# Patient Record
Sex: Female | Born: 1945 | Race: Black or African American | Hispanic: No | Smoking: Current every day smoker
Health system: Southern US, Community
[De-identification: ages and names within clinical notes are randomized; demographics above are authoritative.]

## PROBLEM LIST (undated history)

## (undated) DIAGNOSIS — I2510 Atherosclerotic heart disease of native coronary artery without angina pectoris: Secondary | ICD-10-CM

## (undated) DIAGNOSIS — E785 Hyperlipidemia, unspecified: Secondary | ICD-10-CM

## (undated) DIAGNOSIS — R05 Cough: Secondary | ICD-10-CM

## (undated) DIAGNOSIS — I313 Pericardial effusion (noninflammatory): Secondary | ICD-10-CM

## (undated) DIAGNOSIS — I9789 Other postprocedural complications and disorders of the circulatory system, not elsewhere classified: Principal | ICD-10-CM

## (undated) DIAGNOSIS — I4891 Unspecified atrial fibrillation: Secondary | ICD-10-CM

## (undated) DIAGNOSIS — F17200 Nicotine dependence, unspecified, uncomplicated: Secondary | ICD-10-CM

## (undated) DIAGNOSIS — F319 Bipolar disorder, unspecified: Secondary | ICD-10-CM

## (undated) DIAGNOSIS — R590 Localized enlarged lymph nodes: Secondary | ICD-10-CM

## (undated) DIAGNOSIS — I639 Cerebral infarction, unspecified: Secondary | ICD-10-CM

## (undated) DIAGNOSIS — D649 Anemia, unspecified: Secondary | ICD-10-CM

## (undated) DIAGNOSIS — D72829 Elevated white blood cell count, unspecified: Secondary | ICD-10-CM

## (undated) DIAGNOSIS — J449 Chronic obstructive pulmonary disease, unspecified: Secondary | ICD-10-CM

## (undated) DIAGNOSIS — I3139 Other pericardial effusion (noninflammatory): Secondary | ICD-10-CM

## (undated) DIAGNOSIS — F32A Depression, unspecified: Secondary | ICD-10-CM

## (undated) DIAGNOSIS — F419 Anxiety disorder, unspecified: Secondary | ICD-10-CM

## (undated) DIAGNOSIS — J309 Allergic rhinitis, unspecified: Secondary | ICD-10-CM

## (undated) DIAGNOSIS — IMO0001 Reserved for inherently not codable concepts without codable children: Secondary | ICD-10-CM

## (undated) DIAGNOSIS — R0602 Shortness of breath: Secondary | ICD-10-CM

## (undated) DIAGNOSIS — I671 Cerebral aneurysm, nonruptured: Secondary | ICD-10-CM

## (undated) DIAGNOSIS — E119 Type 2 diabetes mellitus without complications: Secondary | ICD-10-CM

## (undated) HISTORY — DX: Reserved for inherently not codable concepts without codable children: IMO0001

## (undated) HISTORY — DX: Hyperlipidemia, unspecified: E78.5

## (undated) HISTORY — DX: Cerebral infarction, unspecified: I63.9

## (undated) HISTORY — DX: Elevated white blood cell count, unspecified: D72.829

## (undated) HISTORY — DX: Localized enlarged lymph nodes: R59.0

## (undated) HISTORY — DX: Anemia, unspecified: D64.9

## (undated) HISTORY — DX: Pericardial effusion (noninflammatory): I31.3

## (undated) HISTORY — DX: Nicotine dependence, unspecified, uncomplicated: F17.200

## (undated) HISTORY — DX: Other postprocedural complications and disorders of the circulatory system, not elsewhere classified: I48.91

## (undated) HISTORY — DX: Other postprocedural complications and disorders of the circulatory system, not elsewhere classified: I97.89

## (undated) HISTORY — PX: CATARACT EXTRACTION: SUR2

## (undated) HISTORY — DX: Bipolar disorder, unspecified: F31.9

## (undated) HISTORY — DX: Cerebral aneurysm, nonruptured: I67.1

## (undated) HISTORY — DX: Allergic rhinitis, unspecified: J30.9

## (undated) HISTORY — DX: Cough: R05

## (undated) HISTORY — DX: Other pericardial effusion (noninflammatory): I31.39

## (undated) HISTORY — DX: Chronic obstructive pulmonary disease, unspecified: J44.9

## (undated) HISTORY — DX: Shortness of breath: R06.02

## (undated) HISTORY — DX: Atherosclerotic heart disease of native coronary artery without angina pectoris: I25.10

## (undated) HISTORY — DX: Type 2 diabetes mellitus without complications: E11.9

---

## 2000-09-25 ENCOUNTER — Other Ambulatory Visit: Admission: RE | Admit: 2000-09-25 | Discharge: 2000-09-25 | Payer: Self-pay | Admitting: Family Medicine

## 2002-07-19 ENCOUNTER — Encounter: Payer: Self-pay | Admitting: Obstetrics & Gynecology

## 2002-07-19 ENCOUNTER — Ambulatory Visit (HOSPITAL_COMMUNITY): Admission: RE | Admit: 2002-07-19 | Discharge: 2002-07-19 | Payer: Self-pay | Admitting: Obstetrics & Gynecology

## 2003-02-28 ENCOUNTER — Emergency Department (HOSPITAL_COMMUNITY): Admission: AD | Admit: 2003-02-28 | Discharge: 2003-02-28 | Payer: Self-pay | Admitting: Family Medicine

## 2003-03-01 ENCOUNTER — Emergency Department (HOSPITAL_COMMUNITY): Admission: AD | Admit: 2003-03-01 | Discharge: 2003-03-01 | Payer: Self-pay | Admitting: Family Medicine

## 2003-05-21 ENCOUNTER — Emergency Department (HOSPITAL_COMMUNITY): Admission: AD | Admit: 2003-05-21 | Discharge: 2003-05-21 | Payer: Self-pay | Admitting: Family Medicine

## 2003-05-24 ENCOUNTER — Emergency Department (HOSPITAL_COMMUNITY): Admission: EM | Admit: 2003-05-24 | Discharge: 2003-05-24 | Payer: Self-pay | Admitting: Family Medicine

## 2003-07-10 ENCOUNTER — Encounter: Admission: RE | Admit: 2003-07-10 | Discharge: 2003-10-08 | Payer: Self-pay | Admitting: Internal Medicine

## 2003-07-21 ENCOUNTER — Ambulatory Visit (HOSPITAL_COMMUNITY): Admission: RE | Admit: 2003-07-21 | Discharge: 2003-07-21 | Payer: Self-pay | Admitting: Obstetrics & Gynecology

## 2006-04-14 HISTORY — PX: COLONOSCOPY: SHX174

## 2009-06-05 ENCOUNTER — Encounter: Admission: RE | Admit: 2009-06-05 | Discharge: 2009-06-05 | Payer: Self-pay | Admitting: Family Medicine

## 2011-05-20 ENCOUNTER — Ambulatory Visit (HOSPITAL_COMMUNITY)
Admission: RE | Admit: 2011-05-20 | Discharge: 2011-05-20 | Disposition: A | Discharge status: Home / Self Care | Payer: Medicare Other | Source: Ambulatory Visit | Attending: Internal Medicine | Admitting: Internal Medicine

## 2011-05-20 ENCOUNTER — Other Ambulatory Visit: Payer: Self-pay

## 2011-05-20 ENCOUNTER — Other Ambulatory Visit (HOSPITAL_COMMUNITY): Payer: Self-pay | Admitting: Internal Medicine

## 2011-05-20 DIAGNOSIS — R5381 Other malaise: Secondary | ICD-10-CM | POA: Insufficient documentation

## 2011-05-20 DIAGNOSIS — E119 Type 2 diabetes mellitus without complications: Secondary | ICD-10-CM | POA: Insufficient documentation

## 2011-05-20 DIAGNOSIS — R5383 Other fatigue: Secondary | ICD-10-CM

## 2011-05-20 DIAGNOSIS — R0602 Shortness of breath: Secondary | ICD-10-CM | POA: Insufficient documentation

## 2011-05-20 DIAGNOSIS — R609 Edema, unspecified: Secondary | ICD-10-CM

## 2011-05-20 DIAGNOSIS — F17200 Nicotine dependence, unspecified, uncomplicated: Secondary | ICD-10-CM | POA: Insufficient documentation

## 2011-10-24 ENCOUNTER — Telehealth: Payer: Self-pay | Admitting: Oncology

## 2011-10-24 NOTE — Telephone Encounter (Signed)
lmonvm adviisng the pt of her new pt appt with dr Gaylyn Rong

## 2011-10-24 NOTE — Telephone Encounter (Signed)
Pt called to confirm her new pt appt

## 2011-10-27 ENCOUNTER — Telehealth: Payer: Self-pay | Admitting: Oncology

## 2011-10-27 NOTE — Telephone Encounter (Signed)
Referred by Dr. Leticia Clas Dx- Leukocytosis

## 2011-10-29 ENCOUNTER — Encounter: Payer: Self-pay | Admitting: Oncology

## 2011-10-29 DIAGNOSIS — D72829 Elevated white blood cell count, unspecified: Secondary | ICD-10-CM | POA: Insufficient documentation

## 2011-10-30 ENCOUNTER — Other Ambulatory Visit: Payer: BC Managed Care – PPO | Admitting: Lab

## 2011-10-30 ENCOUNTER — Encounter: Payer: Self-pay | Admitting: Oncology

## 2011-10-30 ENCOUNTER — Ambulatory Visit: Payer: BC Managed Care – PPO | Admitting: Oncology

## 2011-10-30 ENCOUNTER — Telehealth: Payer: Self-pay | Admitting: Oncology

## 2011-10-30 ENCOUNTER — Ambulatory Visit: Payer: Medicare Other

## 2011-10-30 ENCOUNTER — Other Ambulatory Visit (HOSPITAL_BASED_OUTPATIENT_CLINIC_OR_DEPARTMENT_OTHER): Payer: Medicare Other | Admitting: Lab

## 2011-10-30 ENCOUNTER — Ambulatory Visit (HOSPITAL_BASED_OUTPATIENT_CLINIC_OR_DEPARTMENT_OTHER): Payer: Medicare Other | Admitting: Oncology

## 2011-10-30 VITALS — BP 125/81 | HR 99 | Temp 97.9°F | Ht 63.5 in | Wt 181.8 lb

## 2011-10-30 DIAGNOSIS — F319 Bipolar disorder, unspecified: Secondary | ICD-10-CM

## 2011-10-30 DIAGNOSIS — D72829 Elevated white blood cell count, unspecified: Secondary | ICD-10-CM

## 2011-10-30 DIAGNOSIS — R059 Cough, unspecified: Secondary | ICD-10-CM

## 2011-10-30 DIAGNOSIS — IMO0001 Reserved for inherently not codable concepts without codable children: Secondary | ICD-10-CM | POA: Insufficient documentation

## 2011-10-30 DIAGNOSIS — R0602 Shortness of breath: Secondary | ICD-10-CM

## 2011-10-30 DIAGNOSIS — F17200 Nicotine dependence, unspecified, uncomplicated: Secondary | ICD-10-CM

## 2011-10-30 DIAGNOSIS — E119 Type 2 diabetes mellitus without complications: Secondary | ICD-10-CM

## 2011-10-30 DIAGNOSIS — E785 Hyperlipidemia, unspecified: Secondary | ICD-10-CM | POA: Insufficient documentation

## 2011-10-30 DIAGNOSIS — R05 Cough: Secondary | ICD-10-CM

## 2011-10-30 HISTORY — DX: Cough, unspecified: R05.9

## 2011-10-30 HISTORY — DX: Shortness of breath: R06.02

## 2011-10-30 LAB — CBC WITH DIFFERENTIAL/PLATELET
BASO%: 0.6 % (ref 0.0–2.0)
Eosinophils Absolute: 0.2 10*3/uL (ref 0.0–0.5)
MCHC: 33 g/dL (ref 31.5–36.0)
MONO#: 1.3 10*3/uL — ABNORMAL HIGH (ref 0.1–0.9)
MONO%: 7.7 % (ref 0.0–14.0)
NEUT#: 12.2 10*3/uL — ABNORMAL HIGH (ref 1.5–6.5)
RBC: 4.88 10*6/uL (ref 3.70–5.45)
RDW: 14.9 % — ABNORMAL HIGH (ref 11.2–14.5)
WBC: 16.5 10*3/uL — ABNORMAL HIGH (ref 3.9–10.3)

## 2011-10-30 LAB — MORPHOLOGY: RBC Comments: NORMAL

## 2011-10-30 LAB — CHCC SMEAR

## 2011-10-30 NOTE — Telephone Encounter (Signed)
gv pt appt schedule for AUG. S/w [facility] scheduling re bx order. [facility] will call pt - pt aware.

## 2011-10-30 NOTE — Progress Notes (Signed)
[facility]  Telephone:(336) [phone] Fax:(336) [phone]     INITIAL HEMATOLOGY CONSULTATION    Referral MD:  Dr. Quitman Livings, M.D.  Reason for Referral:  Leukocytosis, neutrophil predominant    HPI:  [name] is a 66 year-old [place] American woman who is a current smoker, history of COPD, bipolar.  She said that she did not have leukocytosis until 05/2011.  Her previous CBC were reportedly normal; I am awaiting result for confirmation.  On 05/23/2011, she had a routine CBC which showed WBC 16.47; Hgb 14.8; plt 389. Her CBC was repeated again on 07/28/2011 which showed WBC 16.6; Hgb 15; Plt 380.  Given persistent leukocytosis, she was kindly referred to the [facility] for evaluation.    Mercedes Thornton presented to the clinic by herself today.  She reported feeling relatively well.  She has chronic cough especially in the morning with clear sputum.  She attributed this to smoking.  She also has mild SOB, and DOE which she again attributes to COPD.  She still smokes cigarettes about 1/2 pack per day; however, she is not ready to quit smoking.  She has poor dental condition in her bilateral lower molars which will have dental treatment soon.  She denied severe pain, or purulent discharge from these teeth.  She has intermittent left ankle swelling with mild erythema but without pain.   Patient denies fever, anorexia, weight loss, fatigue, headache, visual changes, confusion, drenching night sweats, palpable lymph node swelling, mucositis, odynophagia, dysphagia, nausea vomiting, jaundice, chest pain, palpitation, gum bleeding, epistaxis, hematemesis, hemoptysis, abdominal pain, abdominal swelling, early satiety, melena, hematochezia, hematuria, skin rash, spontaneous bleeding, joint swelling, joint pain, heat or cold intolerance, bowel bladder incontinence, back pain, focal motor weakness, paresthesia, depression, suicidal or homocidal ideation, feeling hopelessness.   Past  Medical History  Diagnosis Date  . Leukocytosis   . COPD (chronic obstructive pulmonary disease)   . Allergic rhinitis   . DM (diabetes mellitus)   . Bipolar affective disorder   . Hyperlipidemia   . Smoking   :    Past Surgical History  Procedure Date  . Cataract extraction   . Cesarean section   . Colonoscopy 2008    negative per pt's report, with [facility]   :   CURRENT MEDS: Current Outpatient Prescriptions  Medication Sig Dispense Refill  . Cinnamon 500 MG TABS Take by mouth daily.      . clonazePAM (KLONOPIN) 0.5 MG tablet Take 0.5 mg by mouth daily as needed. One per day or as needed.      Marland Kitchen Cod Liver Oil CAPS Take by mouth daily.      Marland Kitchen ezetimibe (ZETIA) 10 MG tablet Take 10 mg by mouth daily.      . Fenofibrate (TRICOR PO) Take by mouth daily.      . fish oil-omega-3 fatty acids 1000 MG capsule Take 1 g by mouth daily.      Marland Kitchen GLIMEPIRIDE PO Take by mouth daily.      . metFORMIN (GLUCOPHAGE) 500 MG tablet Take 1,000 mg by mouth 2 (two) times daily with a meal.      . Multiple Vitamins-Minerals (WOMENS MULTIVITAMIN PLUS PO) Take by mouth daily.      . vitamin C (ASCORBIC ACID) 500 MG tablet Take 500 mg by mouth daily.          Allergies  Allergen Reactions  . Penicillins Swelling  :  Family History  Problem Relation Age of Onset  .  Stroke Mother   . Cancer Father     prostate  :  History   Social History  . Marital Status: Divorced    Spouse Name: N/A    Number of Children: 2  . Years of Education: N/A   Occupational History  .      retired grade school/middle school teacher   Social History Main Topics  . Smoking status: Current Everyday Smoker -- 1.0 packs/day for 40 years  . Smokeless tobacco: Not on file  . Alcohol Use: No  . Drug Use: No  . Sexually Active:    Other Topics Concern  . Not on file   Social History Narrative  . No narrative on file  :  REVIEW OF SYSTEM:  The rest of the 14-point review of sytem was negative.    Exam: ECOG 0-1  General:  well-nourished woman, in no acute distress.  Eyes:  no scleral icterus.  ENT:  There were no oropharyngeal lesions.  Neck was without thyromegaly.  Lymphatics:  Negative cervical, supraclavicular or axillary adenopathy.  Respiratory: lungs were clear bilaterally without wheezing or crackles.  Cardiovascular:  Regular rate and rhythm, S1/S2, without murmur, rub or gallop.  There was no pedal edema.  GI:  abdomen was soft, flat, nontender, nondistended, without organomegaly.  Muscoloskeletal:  no spinal tenderness of palpation of vertebral spine.  Skin exam was without echymosis, petichae.  Neuro exam was nonfocal.  Patient was able to get on and off exam table without assistance.  Gait was normal.  Patient was alerted and oriented.  Attention was good.   Language was appropriate.  Mood was normal without depression.  Speech was not pressured.  Thought content was not tangential.    LABS:  Lab Results  Component Value Date   WBC 16.5* 10/30/2011   HGB 14.3 10/30/2011   HCT 43.4 10/30/2011   PLT 370 10/30/2011    Blood smear review:   I personally reviewed the patient's peripheral blood smear today.  There was isocytosis.  There was no peripheral blast.  However, there were occasional atypical lymphocytes.   There was no schistocytosis, spherocytosis, target cell, rouleaux formation, tear drop cell.  There was no giant platelets or platelet clumps.      ASSESSMENT AND PLAN:   1.  Smoking:  I strongly urged her to stop smoking as she is at risk of cancer and vascular disease.  Again, she is not emotionally ready to quit.  I advised her to contact us for help when she is ready.  Given her extensive history of smoking, she is at high risk of bronchogenic carcinoma.  She also has chronic cough and mild SOB which could be due to COPD but I need to rule out lung cancer. I recommended screening lung CT to rule out bronchogenic carcinoma. She fits the profile for high risk per  NCCN guideline (age >62; smoker >30 pack years, COPD) qualifying her for a screening chest CT.    2.  Cough:  Most likely smoker cough/bronchtis, need to rule out lung cancer.   3.  Dyspnea on exertion, SOB:   Most likely COPD; but need to rule out lung cancer.   4.  Bipolar:  On Klonopin per PCP.  She has not been on lithium for more than 20 years.   5.  Diabetes mellitus, type II:  Poor controlled (last Hgb A1c was 8); no apparent complication.  She is on metformin and Glimepiride.   6.  Hyperlipidemia:  She  is on Zetia, Tricor, fish oil per PCP.   7.  Leukocytosis:  Reportedly acute for the past 3 months - Differential diagnosis:  Most likely reactive.  However, given her persistent leukocytosis without obvious source of infection or primary reactive source, I need to rule out lymphoproliferative disease.   - Work up:   I strongly recommended diagnostic bone marrow biopsy.  She is relatively up to date with age-appropriate cancer screening.  She claimed to be up to date with colon cancer screening with both colonoscopy and last one was sigmoidoscopy with [facility] about 5 years ago.  Her last Pap smear was about 3 years ago, and they have always been negative.  Her last mammogram was about 3 years ago and was reportedly negative.  She examines her own breasts on a routine basis and has not found any abnormality.  However, I recommended CT chest for lung cancer screening given her extensive history of smoking with cough, DOE.   - Treatment:  Pending work up.   Thank you for this referral.     The length of time of the face-to-face encounter was 45 minutes. More than 50% of time was spent counseling and coordination of care.

## 2011-10-30 NOTE — Patient Instructions (Signed)
1.  Issue:  Leukocytosis (elevated white blood cell).  Supposedly an acute issue.  I will need to contact Dr. Roseanne Reno for past CBC to confirm this since an acute rise in WBC is more concerning than if it has been chronic. 2.  Work up:  Bone marrow biopsy by Interventional Radiologist to rule out leukemia, lymphoma.   If bone marrow biopsy is negative.  We can attribute your leukocytosis to non hematology issue, and your primary doctor may need to help [place] figure out what these are.   3.  Follow up:  Radiology will call you with appointment for bone marrow biopsy.  I will see you about 3 weeks after bone marrow biopsy to discuss the result.

## 2011-10-30 NOTE — Progress Notes (Signed)
Patient came in today as a new patient and she has two insurance,she said she should be [place] as far as financial assistance.

## 2011-10-31 LAB — FLOW CYTOMETRY

## 2011-11-05 ENCOUNTER — Telehealth: Payer: Self-pay | Admitting: *Deleted

## 2011-11-05 ENCOUNTER — Telehealth: Payer: Self-pay | Admitting: Oncology

## 2011-11-05 NOTE — Telephone Encounter (Signed)
Pt left VM earlier today stated had questions about some tests.  I called pt back and she said her questions have been answered by Radiology and everything is "straightened out."   Instructed pt to call back if any new questions/concerns.

## 2011-11-05 NOTE — Telephone Encounter (Signed)
Received call from [facility] @ [facility] today re outstanding order for ct-chest. Also when Sheralyn Boatman called pt w/bx appt pt asked her about ct appt. Order for ct was entered 7/18 @ 5:58 pm after pt had already been checked out. Per Sheralyn Boatman she will add ct for 7/30 w/bx. Per Sheralyn Boatman pt requested that ct be same day as bx. Per Sheralyn Boatman she will contact pt re ct appt.

## 2011-11-06 ENCOUNTER — Other Ambulatory Visit: Payer: Self-pay | Admitting: Radiology

## 2011-11-10 ENCOUNTER — Telehealth: Payer: Self-pay | Admitting: *Deleted

## 2011-11-10 DIAGNOSIS — D72829 Elevated white blood cell count, unspecified: Secondary | ICD-10-CM

## 2011-11-10 NOTE — Telephone Encounter (Signed)
Attempted to reach pt again w/o any answer.  Spoke w/ Radiology and informed order was placed in computer for BMET for them to do at [facility] in the morning before her CT.

## 2011-11-10 NOTE — Telephone Encounter (Signed)
Rec note from Radiology states pt needs to have BMET done prior to CT scheduled for tomorrow.  Left pt VM at home to return call so I can schedule a lab appt prior to CT scan.

## 2011-11-11 ENCOUNTER — Encounter (HOSPITAL_COMMUNITY): Payer: Self-pay

## 2011-11-11 ENCOUNTER — Ambulatory Visit (HOSPITAL_COMMUNITY)
Admission: RE | Admit: 2011-11-11 | Discharge: 2011-11-11 | Disposition: A | Discharge status: Home / Self Care | Payer: Medicare Other | Source: Ambulatory Visit | Attending: Oncology | Admitting: Oncology

## 2011-11-11 DIAGNOSIS — R05 Cough: Secondary | ICD-10-CM

## 2011-11-11 DIAGNOSIS — D72829 Elevated white blood cell count, unspecified: Secondary | ICD-10-CM

## 2011-11-11 DIAGNOSIS — R059 Cough, unspecified: Secondary | ICD-10-CM

## 2011-11-11 DIAGNOSIS — Z79899 Other long term (current) drug therapy: Secondary | ICD-10-CM | POA: Insufficient documentation

## 2011-11-11 DIAGNOSIS — J4489 Other specified chronic obstructive pulmonary disease: Secondary | ICD-10-CM | POA: Insufficient documentation

## 2011-11-11 DIAGNOSIS — F17200 Nicotine dependence, unspecified, uncomplicated: Secondary | ICD-10-CM

## 2011-11-11 DIAGNOSIS — R0602 Shortness of breath: Secondary | ICD-10-CM

## 2011-11-11 DIAGNOSIS — F319 Bipolar disorder, unspecified: Secondary | ICD-10-CM | POA: Insufficient documentation

## 2011-11-11 DIAGNOSIS — D473 Essential (hemorrhagic) thrombocythemia: Secondary | ICD-10-CM | POA: Insufficient documentation

## 2011-11-11 DIAGNOSIS — E785 Hyperlipidemia, unspecified: Secondary | ICD-10-CM

## 2011-11-11 DIAGNOSIS — E119 Type 2 diabetes mellitus without complications: Secondary | ICD-10-CM | POA: Insufficient documentation

## 2011-11-11 DIAGNOSIS — J449 Chronic obstructive pulmonary disease, unspecified: Secondary | ICD-10-CM | POA: Insufficient documentation

## 2011-11-11 LAB — CREATININE, SERUM
Creatinine, Ser: 0.62 mg/dL (ref 0.50–1.10)
GFR calc Af Amer: >90 mL/min (ref >90)
GFR calc non Af Amer: >90 mL/min (ref >90)

## 2011-11-11 LAB — CBC
Hemoglobin: 14.1 g/dL (ref 12.0–15.0)
MCH: 29.3 pg (ref 26.0–34.0)
MCHC: 33.8 g/dL (ref 30.0–36.0)
MCV: 86.7 fL (ref 78.0–100.0)
RBC: 4.81 MIL/uL (ref 3.87–5.11)

## 2011-11-11 LAB — GLUCOSE, CAPILLARY: Glucose-Capillary: 121 mg/dL — ABNORMAL HIGH (ref 70–99)

## 2011-11-11 LAB — APTT: aPTT: 34 s (ref 24–37)

## 2011-11-11 LAB — PROTIME-INR: Prothrombin Time: 12.6 seconds (ref 11.6–15.2)

## 2011-11-11 MED ORDER — HYDROCODONE-ACETAMINOPHEN 5-325 MG PO TABS
1.0000 | ORAL_TABLET | ORAL | Status: DC | PRN
  Filled 2011-11-11: qty 2

## 2011-11-11 MED ORDER — IOHEXOL 300 MG/ML  SOLN
80.0000 mL | Freq: Once | INTRAMUSCULAR | Status: AC | PRN
  Administered 2011-11-11: 80 mL via INTRAVENOUS

## 2011-11-11 MED ORDER — FENTANYL CITRATE 0.05 MG/ML IJ SOLN
INTRAMUSCULAR | Status: AC | PRN
  Administered 2011-11-11 (×2): 100 ug via INTRAVENOUS

## 2011-11-11 MED ORDER — SODIUM CHLORIDE 0.9 % IV SOLN: Freq: Once | INTRAVENOUS | Status: DC

## 2011-11-11 MED ORDER — MIDAZOLAM HCL 5 MG/5ML IJ SOLN
INTRAMUSCULAR | Status: AC | PRN
  Administered 2011-11-11 (×2): 2 mg via INTRAVENOUS

## 2011-11-11 MED ORDER — FENTANYL CITRATE 0.05 MG/ML IJ SOLN
INTRAMUSCULAR | Status: AC
  Filled 2011-11-11: qty 4

## 2011-11-11 MED ORDER — MIDAZOLAM HCL 2 MG/2ML IJ SOLN
INTRAMUSCULAR | Status: AC
  Filled 2011-11-11: qty 4

## 2011-11-11 NOTE — Procedures (Signed)
Procedure:  CT guided bone marrow biopsy Aspirate and core bx of right iliac bone marrow.  No complications

## 2011-11-11 NOTE — Progress Notes (Signed)
IV D/C from right hand site WNL cath tip intact .

## 2011-11-11 NOTE — H&P (Signed)
Mercedes Thornton is an 66 y.o. female.   Chief Complaint: "I'm here for a bone marrow biopsy" HPI: Patient with history of persistent leukocytosis presents today for CT guided bone marrow biopsy.  Past Medical History  Diagnosis Date  . Leukocytosis   . COPD (chronic obstructive pulmonary disease)   . Allergic rhinitis   . DM (diabetes mellitus)   . Bipolar affective disorder   . Hyperlipidemia   . Smoking   . Cough 10/30/2011  . SOB (shortness of breath) 10/30/2011    Past Surgical History  Procedure Date  . Cataract extraction   . Cesarean section   . Colonoscopy 2008    negative per pt's report, with [facility]     Family History  Problem Relation Age of Onset  . Stroke Mother   . Cancer Father     prostate   Social History:  reports that she has been smoking.  She does not have any smokeless tobacco history on file. She reports that she does not drink alcohol or use illicit drugs.  Allergies:  Allergies  Allergen Reactions  . Penicillins Swelling    Current outpatient prescriptions:Cinnamon 500 MG TABS, Take by mouth daily., Disp: , Rfl: ;  clonazePAM (KLONOPIN) 0.5 MG tablet, Take 0.5 mg by mouth daily as needed. One per day or as needed., Disp: , Rfl: ;  Cod Liver Oil CAPS, Take by mouth daily., Disp: , Rfl: ;  ezetimibe (ZETIA) 10 MG tablet, Take 10 mg by mouth daily., Disp: , Rfl: ;  Fenofibrate (TRICOR PO), Take by mouth daily., Disp: , Rfl:  fish oil-omega-3 fatty acids 1000 MG capsule, Take 1 g by mouth daily., Disp: , Rfl: ;  glimepiride (AMARYL) 2 MG tablet, Take 2 mg by mouth daily before breakfast., Disp: , Rfl: ;  metFORMIN (GLUCOPHAGE) 500 MG tablet, Take 1,000 mg by mouth 2 (two) times daily with a meal., Disp: , Rfl: ;  Multiple Vitamins-Minerals (WOMENS MULTIVITAMIN PLUS PO), Take 1 tablet by mouth daily. , Disp: , Rfl:  vitamin C (ASCORBIC ACID) 500 MG tablet, Take 500 mg by mouth daily., Disp: , Rfl:  No current facility-administered medications for this  encounter. Facility-Administered Medications Ordered in Other Encounters: 0.9 %  sodium chloride infusion, , Intravenous, Once, Brayton El, PA;  fentaNYL (SUBLIMAZE) 0.05 MG/ML injection, , , , ;  midazolam (VERSED) 2 MG/2ML injection, , , ,    Results for orders placed during the hospital encounter of 11/11/11 (from the past 48 hour(s))  GLUCOSE, CAPILLARY     Status: Abnormal   Collection Time   11/11/11  8:08 AM      Component Value Range Comment   Glucose-Capillary 121 (*) 70 - 99 mg/dL    Results for orders placed during the hospital encounter of 11/11/11  GLUCOSE, CAPILLARY      Component Value Range   Glucose-Capillary 121 (*) 70 - 99 mg/dL    Review of Systems  Constitutional: Negative for fever and chills.  Respiratory: Positive for cough and shortness of breath. Sputum production: 188.   Cardiovascular: Negative for chest pain.  Gastrointestinal: Negative for nausea, vomiting and abdominal pain.  Musculoskeletal: Negative for back pain.       Intermittent left ankle swelling  Neurological: Negative for headaches.  Endo/Heme/Allergies: Does not bruise/bleed easily.    There were no vitals taken for this visit. Physical Exam  Constitutional: She is oriented to person, place, and time. She appears well-developed and well-nourished.  Cardiovascular: Normal rate and  regular rhythm.   Respiratory: Effort normal.       Distant BS bilat  GI: Soft. Bowel sounds are normal. There is no tenderness.       Mildly obese  Musculoskeletal: Normal range of motion.       Mild left ankle edema; no pretibial edema  Neurological: She is alert and oriented to person, place, and time.     Assessment/Plan Patient with history of persistent leukocytosis; plan is for CT guided bone marrow biopsy. Details/risks of procedure d/w pt/family with their understanding and consent.  Raegan Sipp,D KEVIN 11/11/2011, 8:22 AM

## 2011-11-11 NOTE — H&P (Signed)
Agree 

## 2011-11-26 ENCOUNTER — Encounter: Payer: Self-pay | Admitting: Oncology

## 2011-11-26 ENCOUNTER — Telehealth: Payer: Self-pay | Admitting: Oncology

## 2011-11-26 ENCOUNTER — Ambulatory Visit (HOSPITAL_BASED_OUTPATIENT_CLINIC_OR_DEPARTMENT_OTHER): Payer: Medicare Other | Admitting: Oncology

## 2011-11-26 VITALS — BP 113/79 | HR 83 | Temp 98.1°F | Resp 20 | Ht 64.0 in | Wt 183.8 lb

## 2011-11-26 DIAGNOSIS — Z1231 Encounter for screening mammogram for malignant neoplasm of breast: Secondary | ICD-10-CM

## 2011-11-26 DIAGNOSIS — D72829 Elevated white blood cell count, unspecified: Secondary | ICD-10-CM

## 2011-11-26 DIAGNOSIS — R609 Edema, unspecified: Secondary | ICD-10-CM

## 2011-11-26 DIAGNOSIS — Z1239 Encounter for other screening for malignant neoplasm of breast: Secondary | ICD-10-CM

## 2011-11-26 DIAGNOSIS — R590 Localized enlarged lymph nodes: Secondary | ICD-10-CM | POA: Insufficient documentation

## 2011-11-26 DIAGNOSIS — R599 Enlarged lymph nodes, unspecified: Secondary | ICD-10-CM

## 2011-11-26 DIAGNOSIS — F17200 Nicotine dependence, unspecified, uncomplicated: Secondary | ICD-10-CM

## 2011-11-26 HISTORY — DX: Localized enlarged lymph nodes: R59.0

## 2011-11-26 NOTE — Telephone Encounter (Signed)
gv pt appts for November 2013 and February 2014 including ct for 2/12. Pt also given [facility] for [facility].

## 2011-11-26 NOTE — Patient Instructions (Addendum)
1.  Issue:  Leukocytosis (elevated white blood cell count).   - Work up:  Non revealing including bone marrow biopsy, cytogenetics, CT scan of chest.  Please make sure that you're up to date with screening mammogram, colonoscopy, and Papsmear.   2.  Recommendation:  Watchful observation.  3.  Follow up:  Lab only in about 3 months.  Return visit in about 6 months.

## 2011-12-02 ENCOUNTER — Encounter: Payer: Self-pay | Admitting: Oncology

## 2011-12-02 NOTE — Progress Notes (Signed)
[facility]  Telephone:(336) [phone] Fax:(336) [phone]   OFFICE PROGRESS NOTE   Cc:  HASSAN,SAMI, MD  DIAGNOSIS: reactive neutrophil-predominant leukocytosis; Bone marrow biopsy on 11/11/2011 was negative for myeloproliferative disease.  Cytogenetics were negative.   CURRENT THERAPY:  Watchful observation.   INTERVAL HISTORY: Mercedes Thornton 66 y.o. female returns to clinic to discuss her bone marrow biopsy result.  She reports feeling well. She still has chronic nonproductive cough with mild SOB.  She denied fever, chest pain, PND, orthopnea, skin rash, cellulitis, dysuria, hematuria.  In other words, she denied recurrent infection.  She has intermittent bilateral lower extremity edema only superior to the ankles without calf/thigh swelling or pain.    Past Medical History  Diagnosis Date  . Leukocytosis   . COPD (chronic obstructive pulmonary disease)   . Allergic rhinitis   . DM (diabetes mellitus)   . Bipolar affective disorder   . Hyperlipidemia   . Smoking   . Cough 10/30/2011  . SOB (shortness of breath) 10/30/2011  . Mediastinal adenopathy 11/26/2011    Past Surgical History  Procedure Date  . Cataract extraction   . Cesarean section   . Colonoscopy 2008    negative per pt's report, with [facility]     Current Outpatient Prescriptions  Medication Sig Dispense Refill  . Cinnamon 500 MG TABS Take by mouth daily.      . clonazePAM (KLONOPIN) 0.5 MG tablet Take 0.5 mg by mouth daily as needed. One per day or as needed.      Marland Kitchen Cod Liver Oil CAPS Take by mouth daily.      Marland Kitchen ezetimibe (ZETIA) 10 MG tablet Take 10 mg by mouth daily.      . Fenofibrate (TRICOR PO) Take by mouth daily.      . fish oil-omega-3 fatty acids 1000 MG capsule Take 1 g by mouth daily.      Marland Kitchen glimepiride (AMARYL) 2 MG tablet Take 2 mg by mouth daily before breakfast.      . metFORMIN (GLUCOPHAGE) 500 MG tablet Take 1,000 mg by mouth 2 (two) times daily with a meal.      . Multiple  Vitamins-Minerals (WOMENS MULTIVITAMIN PLUS PO) Take 1 tablet by mouth daily.       . vitamin C (ASCORBIC ACID) 500 MG tablet Take 500 mg by mouth daily.      . diazepam (VALIUM) 5 MG tablet 5 mg. As needed prior to dental work only        ALLERGIES:  is allergic to penicillins.  REVIEW OF SYSTEMS:  The rest of the 14-point review of system was negative.   Filed Vitals:   11/26/11 1346  BP: 113/79  Pulse: 83  Temp: 98.1 F (36.7 C)  Resp: 20   Wt Readings from Last 3 Encounters:  11/26/11 183 lb 12.8 oz (83.371 kg)  11/11/11 181 lb (82.101 kg)  10/30/11 181 lb 12.8 oz (82.464 kg)   ECOG Performance status:   PHYSICAL EXAMINATION:  General:  well-nourished in no acute distress.  Eyes:  no scleral icterus.  ENT:  There were no oropharyngeal lesions.  Neck was without thyromegaly.  Lymphatics:  Negative cervical, supraclavicular or axillary adenopathy.  Respiratory: lungs were clear bilaterally without wheezing or crackles.  Cardiovascular:  Regular rate and rhythm, S1/S2, without murmur, rub or gallop.  I did not appreciate any pedal edema.  GI:  abdomen was soft, flat, nontender, nondistended, without organomegaly.  Muscoloskeletal:  no spinal tenderness of  palpation of vertebral spine.  Skin exam was without echymosis, petichae.  Neuro exam was nonfocal.  Patient was able to get on and off exam table without assistance.  Gait was normal.  Patient was alerted and oriented.  Attention was good.   Language was appropriate.  Mood was normal without depression.  Speech was not pressured.  Thought content was not tangential.     LABORATORY/RADIOLOGY DATA:  Lab Results  Component Value Date   WBC 17.9* 11/11/2011   HGB 14.1 11/11/2011   HCT 41.7 11/11/2011   PLT 420* 11/11/2011   CREATININE 0.62 11/11/2011   BUN 11 11/11/2011   INR 0.92 11/11/2011    Ct Chest W Contrast  11/11/2011  *RADIOLOGY REPORT*  Clinical Data: Smoker with 30 pack year history.  COPD cough, and progressive  leukocytosis.  CT CHEST WITH CONTRAST  Technique:  Multidetector CT imaging of the chest was performed following the standard protocol during bolus administration of intravenous contrast.  Contrast: 80mL OMNIPAQUE IOHEXOL 300 MG/ML  SOLN  Comparison: Chest radiograph 05/20/2011  Findings:   Normal thyroid gland.  Negative for supraclavicular lymphadenopathy.  Mild mediastinal and bihilar lymphadenopathy is present. Subcarinal lymph node measures 11 mm short axis.  Aortopulmonary window lymph node measures 13 mm short axis.  Left hilar lymph node measures 9 mm short axis.  Right hilar lymph node measures 11 mm short axis.  There are additional smaller mediastinal lymph nodes. The imaged portion of the axilla shows normal-sized lymph nodes.  There is mild cardiomegaly.  Thoracic aorta is normal in caliber contains atherosclerotic calcification.  Scattered coronary artery atherosclerotic calcification noted.  Negative for pleural or pericardial effusion.  The image detail of the lung windows is slightly limited by respiratory motion.  There is moderate centrilobular emphysema, mild paracentral emphysema, and a few scattered blebs bilaterally. There are areas of dependent atelectasis in both upper lobes.  There is focal atelectasis in the medial right middle lobe.  A band-like opacity in the lingula likely reflects atelectasis and/or scarring.  No pulmonary mass or nodule is identified.  The trachea and mainstem bronchi are patent.  Normal adrenal glands.  The spleen is normal in size and demonstrates a single focal calcification.  The imaged portion of the liver is normal.  Imaged portion of the pancreas and upper poles of both kidneys are within normal limits.  Negative for lymphadenopathy in the visualized portion of the upper abdomen.  The imaged vertebral bodies are normal in height and alignment.  No acute or suspicious bony abnormality.  IMPRESSION:  1.  Mild mediastinal and hilar lymphadenopathy. 2.   Centrilobular and paracentral emphysema (moderate).  3.  Scattered areas of atelectasis and/or scarring.  Original Report Authenticated By: Britta Mccreedy, M.D.     ASSESSMENT AND PLAN:  1. Smoking: she is trying her best to quit cold [place].  I advised her to let us know if she needs assistance in the future.  2. Cough: Most likely smoker cough/bronchtis.  CT chest was negative for lung mass.  There were mild mediastinal and hilar LAD.  Most likely these are reactive.  However, I will repeat CT chest again in about 6 months to ensure stability. Other consideration include sarcoidosis.  If her cough persists or worsens, I advised PCP to refer to pulmonary for further evaluation.  3. Mild dyspnea on exertion, SOB: Most likely COPD.  4. Bipolar: On Klonopin per PCP. She has not been on lithium for more than 20 years.  5.  Diabetes mellitus, type II: Poor controlled (last Hgb A1c was 8); no apparent complication. She is on metformin and Glimepiride.  6. Hyperlipidemia: She is on Zetia, Tricor, fish oil per PCP.  7. Leukocytosis:  Most likely reactive.  - Work up with bone marrow biopsy was negative for myeloproliferative disease.  Cytogenetics were negative.  I cannot rule out chronic neutrophilia which normally has a benign course.  - I strongly advised patient to continue with age-appropriate cancer screening since at time, persistent leukocytosis is reactive to some occult process. I [facility]. She thinks that she is up to date with screening colonoscopy and Papsmear and would find out from her PCP when these are due.  - Treatment:  None needed for reactive leukocytosis.   8.  Mediastinal/hilar adenopathy:  See #2.  9.  Bilateral ankle edema:  Most likely due to venous insufficiency.  There is low suspicion for DVT or obstructive adenopathy due to only ankle swelling and intermittent nature.  I advised her on compression stocking.   10.  Follow up:   CBC with [facility] in about 3 months.  Lab and return visit in about 6 months.  I advised her to contact us in the interval with concerning issues.         The length of time of the face-to-face encounter was 15  minutes. More than 50% of time was spent counseling and coordination of care.

## 2011-12-11 ENCOUNTER — Ambulatory Visit (HOSPITAL_COMMUNITY)
Admission: RE | Admit: 2011-12-11 | Discharge: 2011-12-11 | Disposition: A | Discharge status: Home / Self Care | Payer: Medicare Other | Source: Ambulatory Visit | Attending: Oncology | Admitting: Oncology

## 2011-12-11 DIAGNOSIS — R590 Localized enlarged lymph nodes: Secondary | ICD-10-CM

## 2011-12-11 DIAGNOSIS — D72829 Elevated white blood cell count, unspecified: Secondary | ICD-10-CM

## 2011-12-11 DIAGNOSIS — Z1231 Encounter for screening mammogram for malignant neoplasm of breast: Secondary | ICD-10-CM | POA: Insufficient documentation

## 2012-02-26 ENCOUNTER — Other Ambulatory Visit (HOSPITAL_BASED_OUTPATIENT_CLINIC_OR_DEPARTMENT_OTHER): Payer: Medicare Other | Admitting: Lab

## 2012-02-26 ENCOUNTER — Telehealth: Payer: Self-pay | Admitting: *Deleted

## 2012-02-26 DIAGNOSIS — R590 Localized enlarged lymph nodes: Secondary | ICD-10-CM

## 2012-02-26 DIAGNOSIS — D72829 Elevated white blood cell count, unspecified: Secondary | ICD-10-CM

## 2012-02-26 DIAGNOSIS — Z1239 Encounter for other screening for malignant neoplasm of breast: Secondary | ICD-10-CM

## 2012-02-26 DIAGNOSIS — R599 Enlarged lymph nodes, unspecified: Secondary | ICD-10-CM

## 2012-02-26 LAB — CBC WITH DIFFERENTIAL/PLATELET
BASO%: 0.7 % (ref 0.0–2.0)
Eosinophils Absolute: 0.1 10*3/uL (ref 0.0–0.5)
MCHC: 33 g/dL (ref 31.5–36.0)
MONO#: 1.3 10*3/uL — ABNORMAL HIGH (ref 0.1–0.9)
NEUT#: 10.2 10*3/uL — ABNORMAL HIGH (ref 1.5–6.5)
RBC: 5.04 10*6/uL (ref 3.70–5.45)
RDW: 15.3 % — ABNORMAL HIGH (ref 11.2–14.5)
WBC: 14.8 10*3/uL — ABNORMAL HIGH (ref 3.9–10.3)

## 2012-02-26 LAB — CHCC SMEAR

## 2012-02-26 NOTE — Telephone Encounter (Signed)
Called pt w/ lab results,  Informed Leukocytosis improved per Dr. Gaylyn Rong and continue observation,  Keep next lab appt in 3 months as scheduled. She verbalized understanding.

## 2012-02-26 NOTE — Telephone Encounter (Signed)
Message copied by Wende Mott on Thu Feb 26, 2012  3:01 PM ------      Message from: HA, Raliegh Ip T      Created: Thu Feb 26, 2012  2:34 PM       Please call pt.  Her leukocytosis is stable (slightly improved).  Continue observation.  Thanks.

## 2012-05-24 ENCOUNTER — Telehealth: Payer: Self-pay | Admitting: Oncology

## 2012-05-24 NOTE — Telephone Encounter (Signed)
pt needed to r/s appt...Done °

## 2012-05-25 ENCOUNTER — Other Ambulatory Visit: Payer: Self-pay | Admitting: Oncology

## 2012-05-26 ENCOUNTER — Ambulatory Visit (HOSPITAL_COMMUNITY): Payer: Medicare Other

## 2012-05-26 ENCOUNTER — Other Ambulatory Visit: Payer: Medicare Other | Admitting: Lab

## 2012-05-28 ENCOUNTER — Other Ambulatory Visit: Payer: Medicare Other | Admitting: Lab

## 2012-05-28 ENCOUNTER — Ambulatory Visit: Payer: Medicare Other | Admitting: Oncology

## 2012-06-02 ENCOUNTER — Ambulatory Visit (HOSPITAL_COMMUNITY)
Admission: RE | Admit: 2012-06-02 | Discharge: 2012-06-02 | Disposition: A | Discharge status: Home / Self Care | Payer: Medicare Other | Source: Ambulatory Visit | Attending: Oncology | Admitting: Oncology

## 2012-06-02 ENCOUNTER — Encounter (HOSPITAL_COMMUNITY): Payer: Self-pay

## 2012-06-02 ENCOUNTER — Other Ambulatory Visit: Payer: Medicare Other | Admitting: Lab

## 2012-06-02 DIAGNOSIS — D72829 Elevated white blood cell count, unspecified: Secondary | ICD-10-CM | POA: Insufficient documentation

## 2012-06-02 DIAGNOSIS — Z87891 Personal history of nicotine dependence: Secondary | ICD-10-CM | POA: Insufficient documentation

## 2012-06-02 DIAGNOSIS — R599 Enlarged lymph nodes, unspecified: Secondary | ICD-10-CM | POA: Insufficient documentation

## 2012-06-02 DIAGNOSIS — Z1239 Encounter for other screening for malignant neoplasm of breast: Secondary | ICD-10-CM

## 2012-06-02 DIAGNOSIS — R590 Localized enlarged lymph nodes: Secondary | ICD-10-CM

## 2012-06-02 LAB — CBC WITH DIFFERENTIAL/PLATELET
Basophils Absolute: 0.1 10*3/uL (ref 0.0–0.1)
Eosinophils Absolute: 0.2 10*3/uL (ref 0.0–0.5)
HGB: 14.2 g/dL (ref 11.6–15.9)
LYMPH%: 21.6 % (ref 14.0–49.7)
MCV: 87.5 fL (ref 79.5–101.0)
MONO%: 7.9 % (ref 0.0–14.0)
NEUT#: 10.2 10*3/uL — ABNORMAL HIGH (ref 1.5–6.5)
Platelets: 390 10*3/uL (ref 145–400)
RDW: 15.4 % — ABNORMAL HIGH (ref 11.2–14.5)

## 2012-06-02 LAB — CHCC SMEAR

## 2012-06-02 LAB — COMPREHENSIVE METABOLIC PANEL (CC13)
ALT: 17 U/L (ref 0–55)
CO2: 24 mEq/L (ref 22–29)
Sodium: 141 mEq/L (ref 136–145)
Total Bilirubin: 0.28 mg/dL (ref 0.20–1.20)
Total Protein: 8.2 g/dL (ref 6.4–8.3)

## 2012-06-02 LAB — MORPHOLOGY

## 2012-06-02 MED ORDER — IOHEXOL 300 MG/ML  SOLN
80.0000 mL | Freq: Once | INTRAMUSCULAR | Status: AC | PRN
  Administered 2012-06-02: 80 mL via INTRAVENOUS

## 2012-06-03 ENCOUNTER — Encounter: Payer: Self-pay | Admitting: Oncology

## 2012-06-03 ENCOUNTER — Telehealth: Payer: Self-pay | Admitting: Oncology

## 2012-06-03 ENCOUNTER — Ambulatory Visit (HOSPITAL_BASED_OUTPATIENT_CLINIC_OR_DEPARTMENT_OTHER): Payer: Medicare Other | Admitting: Oncology

## 2012-06-03 VITALS — BP 116/65 | HR 86 | Temp 97.2°F | Resp 18 | Ht 64.0 in | Wt 180.0 lb

## 2012-06-03 DIAGNOSIS — F17200 Nicotine dependence, unspecified, uncomplicated: Secondary | ICD-10-CM

## 2012-06-03 DIAGNOSIS — D72829 Elevated white blood cell count, unspecified: Secondary | ICD-10-CM

## 2012-06-03 DIAGNOSIS — R059 Cough, unspecified: Secondary | ICD-10-CM

## 2012-06-03 DIAGNOSIS — R609 Edema, unspecified: Secondary | ICD-10-CM

## 2012-06-03 NOTE — Telephone Encounter (Signed)
gv and printed appt schedule for May and Aug

## 2012-06-03 NOTE — Patient Instructions (Addendum)
I was able to schedule an appointment for you with your PCP on Monday 06/07/12 at 2:00 PM.

## 2012-06-03 NOTE — Progress Notes (Signed)
[facility]  [phone] [phone]   OFFICE PROGRESS NOTE   Cc:  HASSAN,SAMI, MD  DIAGNOSIS: reactive neutrophil-predominant leukocytosis; Bone marrow biopsy on 11/11/2011 was negative for myeloproliferative disease.  Cytogenetics were negative.   CURRENT THERAPY:  Watchful observation.   INTERVAL HISTORY: Mercedes Thornton 67 y.o. female returns for routine follow-up.  She reports feeling well. She still has chronic nonproductive cough with mild SOB.  She denied fever, chest pain, PND, orthopnea, skin rash, cellulitis, dysuria, hematuria.  In other words, she denied recurrent infection.  She has intermittent bilateral lower extremity edema only superior to the ankles without calf/thigh swelling or pain. Using compression stockings with improvement.  Past Medical History  Diagnosis Date  . Leukocytosis   . COPD (chronic obstructive pulmonary disease)   . Allergic rhinitis   . DM (diabetes mellitus)   . Bipolar affective disorder   . Hyperlipidemia   . Smoking   . Cough 10/30/2011  . SOB (shortness of breath) 10/30/2011  . Mediastinal adenopathy 11/26/2011    Past Surgical History  Procedure Laterality Date  . Cataract extraction    . Cesarean section    . Colonoscopy  2008    negative per pt's report, with [facility]     Current Outpatient Prescriptions  Medication Sig Dispense Refill  . Cinnamon 500 MG TABS Take by mouth daily.      . clonazePAM (KLONOPIN) 0.5 MG tablet Take 0.5 mg by mouth daily as needed. One per day or as needed.      Marland Kitchen Cod Liver Oil CAPS Take by mouth daily.      . diazepam (VALIUM) 5 MG tablet 5 mg. As needed prior to dental work only      . ezetimibe (ZETIA) 10 MG tablet Take 10 mg by mouth daily.      . Fenofibrate (TRICOR PO) Take by mouth daily.      . fish oil-omega-3 fatty acids 1000 MG capsule Take 1 g by mouth daily.      Marland Kitchen glimepiride (AMARYL) 2 MG tablet Take 2 mg by mouth daily before breakfast.      .  metFORMIN (GLUCOPHAGE) 500 MG tablet Take 1,000 mg by mouth 2 (two) times daily with a meal.      . Multiple Vitamins-Minerals (WOMENS MULTIVITAMIN PLUS PO) Take 1 tablet by mouth daily.       . vitamin C (ASCORBIC ACID) 500 MG tablet Take 500 mg by mouth daily.       No current facility-administered medications for this visit.    ALLERGIES:  is allergic to penicillins.  REVIEW OF SYSTEMS:  The rest of the 14-point review of system was negative.   Filed Vitals:   06/03/12 1527  BP: 116/65  Pulse: 86  Temp: 97.2 F (36.2 C)  Resp: 18   Wt Readings from Last 3 Encounters:  06/03/12 180 lb (81.647 kg)  11/26/11 183 lb 12.8 oz (83.371 kg)  11/11/11 181 lb (82.101 kg)   ECOG Performance status: 1  PHYSICAL EXAMINATION:  General:  well-nourished in no acute distress.  Eyes:  no scleral icterus.  ENT:  There were no oropharyngeal lesions.  Neck was without thyromegaly.  Lymphatics:  Negative cervical, supraclavicular or axillary adenopathy.  Respiratory: lungs were clear bilaterally without wheezing or crackles.  Cardiovascular:  Regular rate and rhythm, S1/S2, without murmur, rub or gallop.  I did not appreciate any pedal edema.  GI:  abdomen was soft, flat, nontender, nondistended, without  organomegaly.  Muscoloskeletal:  no spinal tenderness of palpation of vertebral spine.  Skin exam was without echymosis, petichae.  Neuro exam was nonfocal.  Patient was able to get on and off exam table without assistance.  Gait was normal.  Patient was alerted and oriented.  Attention was good.   Language was appropriate.  Mood was normal without depression.  Speech was not pressured.  Thought content was not tangential.     LABORATORY/RADIOLOGY DATA:  Lab Results  Component Value Date   WBC 14.9* 06/02/2012   HGB 14.2 06/02/2012   HCT 44.1 06/02/2012   PLT 390 06/02/2012   GLUCOSE 89 06/02/2012   ALKPHOS 70 06/02/2012   ALT 17 06/02/2012   AST 15 06/02/2012   NA 141 06/02/2012   K 3.7 06/02/2012    CL 105 06/02/2012   CREATININE 0.8 06/02/2012   BUN 15.7 06/02/2012   CO2 24 06/02/2012   INR 0.92 11/11/2011   *RADIOLOGY REPORT*  Clinical Data: History of smoking  CT CHEST WITH CONTRAST  Technique: Multidetector CT imaging of the chest was performed  following the standard protocol during bolus administration of  intravenous contrast.  Contrast: 80mL OMNIPAQUE IOHEXOL 300 MG/ML SOLN  Comparison: 11/11/2011  Findings: Enlarged mediastinal lymph nodes are again noted. Index  AP window lymph node measures 1.1 cm, image 19. Previously 1.3 cm.  The precarinal lymph node measures 9 mm, image 22. Previously this  measured the same. 9 mm left hilar lymph node is stable from prior  study. Right hilar lymph node is stable measuring 10 mm, image 28.  The heart size is normal. There is a small pericardial effusion  which appears slightly increased in volume from previous exam.  No pleural effusion identified. Moderate changes of centrilobular  emphysema. Tiny nodule in the left upper lobe measures 3 mm, image  28. This is unchanged from previous exam. Right upper lobe nodule  measures 3 mm, image 26. This is also unchanged from previous exam  there no new or enlarging pulmonary nodules or masses.  Review of the visualized bony structures is significant for mild  spondylosis.  There are no worrisome bony abnormalities. Calcified granuloma  noted within the spleen. No acute findings noted within the upper  abdomen.  IMPRESSION:  1. Stable to slightly improved appearance of the mediastinal  adenopathy.  2. Small pericardial effusion appears slightly increased in size  from previous exam.  3. Small 3 mm nodules are unchanged from previous exam. In a  patient that is at increased risk for lung cancer documented  stability of these nodules for 12 months is advised. This  recommendation follows the consensus statement: Guidelines for  Management of Small Pulmonary Nodules Detected on CT Scans: A   Statement from the Fleischner Society as published in Radiology  2005; 237:395-400.  4. Emphysema.  Original Report Authenticated By: Signa Kell, M.D.    ASSESSMENT AND PLAN:  1. Smoking: she is trying her best to quit cold [place].  I advised her to let us know if she needs assistance in the future.  2. Cough: Most likely smoker cough/bronchtis.  CT chest was negative for lung mass.  There were mild mediastinal and hilar LAD.  Most likely these are reactive.   3. Mild dyspnea on exertion, SOB: Most likely COPD.  4. Bipolar: On Klonopin per PCP. She has not been on lithium for more than 20 years.  5. Diabetes mellitus, type II: Poor controlled (last Hgb A1c was 8); no apparent  complication. She is on metformin and Glimepiride.  6. Hyperlipidemia: She is on Zetia, Tricor, fish oil per PCP.  7. Leukocytosis:  Most likely reactive.  - Work up with bone marrow biopsy was negative for myeloproliferative disease.  Cytogenetics were negative.  I cannot rule out chronic neutrophilia which normally has a benign course.  - Treatment:  None needed for reactive leukocytosis.  8.  Mediastinal/hilar adenopathy:  See #2.  9.  Bilateral ankle edema:  Most likely due to venous insufficiency.  There is low suspicion for DVT or obstructive adenopathy due to only ankle swelling and intermittent nature. Improvement with compression stocking. 10.  Follow up:  CBC with the Cancer Center in about 3 months.  Lab and return visit in about 6 months.  I advised her to contact us in the interval with concerning issues.         The length of time of the face-to-face encounter was 15  minutes. More than 50% of time was spent counseling and coordination of care.

## 2012-08-31 ENCOUNTER — Other Ambulatory Visit (HOSPITAL_BASED_OUTPATIENT_CLINIC_OR_DEPARTMENT_OTHER): Payer: Medicare Other | Admitting: Lab

## 2012-08-31 DIAGNOSIS — D72829 Elevated white blood cell count, unspecified: Secondary | ICD-10-CM

## 2012-08-31 LAB — MORPHOLOGY
PLT EST: INCREASED
RBC Comments: NORMAL

## 2012-08-31 LAB — CBC WITH DIFFERENTIAL/PLATELET
BASO%: 0.8 % (ref 0.0–2.0)
Basophils Absolute: 0.1 10*3/uL (ref 0.0–0.1)
EOS%: 1.6 % (ref 0.0–7.0)
Eosinophils Absolute: 0.3 10*3/uL (ref 0.0–0.5)
HCT: 46.3 % (ref 34.8–46.6)
HGB: 15 g/dL (ref 11.6–15.9)
LYMPH%: 22.6 % (ref 14.0–49.7)
MCH: 28.3 pg (ref 25.1–34.0)
MCHC: 32.3 g/dL (ref 31.5–36.0)
MCV: 87.6 fL (ref 79.5–101.0)
MONO#: 1.5 10*3/uL — ABNORMAL HIGH (ref 0.1–0.9)
MONO%: 9.1 % (ref 0.0–14.0)
NEUT#: 10.8 10*3/uL — ABNORMAL HIGH (ref 1.5–6.5)
NEUT%: 65.9 % (ref 38.4–76.8)
Platelets: 403 10*3/uL — ABNORMAL HIGH (ref 145–400)
RBC: 5.29 10*6/uL (ref 3.70–5.45)
RDW: 15 % — ABNORMAL HIGH (ref 11.2–14.5)
WBC: 16.4 10*3/uL — ABNORMAL HIGH (ref 3.9–10.3)
lymph#: 3.7 10*3/uL — ABNORMAL HIGH (ref 0.9–3.3)

## 2012-09-01 ENCOUNTER — Telehealth: Payer: Self-pay | Admitting: *Deleted

## 2012-09-01 NOTE — Telephone Encounter (Signed)
Relayed Mercedes Thornton's message below and confirmed next lab and office visit in August.  Pt verbalized understanding.

## 2012-09-01 NOTE — Telephone Encounter (Signed)
Message copied by Wende Mott on Wed Sep 01, 2012  3:18 PM ------      Message from: Clenton Pare R      Created: Wed Sep 01, 2012  2:20 PM       Please call pt. WBC is stable. Recommend continued observation. ------

## 2012-11-26 ENCOUNTER — Telehealth: Payer: Self-pay | Admitting: Hematology and Oncology

## 2012-11-26 ENCOUNTER — Ambulatory Visit (HOSPITAL_BASED_OUTPATIENT_CLINIC_OR_DEPARTMENT_OTHER): Payer: Medicare Other | Admitting: Hematology and Oncology

## 2012-11-26 ENCOUNTER — Other Ambulatory Visit (HOSPITAL_BASED_OUTPATIENT_CLINIC_OR_DEPARTMENT_OTHER): Payer: Medicare Other | Admitting: Lab

## 2012-11-26 DIAGNOSIS — R059 Cough, unspecified: Secondary | ICD-10-CM

## 2012-11-26 DIAGNOSIS — Z720 Tobacco use: Secondary | ICD-10-CM

## 2012-11-26 DIAGNOSIS — D72829 Elevated white blood cell count, unspecified: Secondary | ICD-10-CM

## 2012-11-26 DIAGNOSIS — F17200 Nicotine dependence, unspecified, uncomplicated: Secondary | ICD-10-CM

## 2012-11-26 DIAGNOSIS — R05 Cough: Secondary | ICD-10-CM

## 2012-11-26 DIAGNOSIS — R911 Solitary pulmonary nodule: Secondary | ICD-10-CM

## 2012-11-26 LAB — COMPREHENSIVE METABOLIC PANEL (CC13)
CO2: 23 mEq/L (ref 22–29)
Creatinine: 0.8 mg/dL (ref 0.6–1.1)
Glucose: 178 mg/dl — ABNORMAL HIGH (ref 70–140)
Sodium: 141 mEq/L (ref 136–145)
Total Bilirubin: 0.31 mg/dL (ref 0.20–1.20)
Total Protein: 7.9 g/dL (ref 6.4–8.3)

## 2012-11-26 LAB — CBC WITH DIFFERENTIAL/PLATELET
Basophils Absolute: 0.1 10*3/uL (ref 0.0–0.1)
EOS%: 0.9 % (ref 0.0–7.0)
HGB: 14.1 g/dL (ref 11.6–15.9)
LYMPH%: 20.3 % (ref 14.0–49.7)
MCH: 28.3 pg (ref 25.1–34.0)
MCV: 86.7 fL (ref 79.5–101.0)
MONO%: 6.6 % (ref 0.0–14.0)
NEUT%: 71.8 % (ref 38.4–76.8)
RDW: 15.1 % — ABNORMAL HIGH (ref 11.2–14.5)

## 2012-11-26 NOTE — Telephone Encounter (Signed)
gv and pritned aptp sched and avs fo rpt.

## 2012-11-26 NOTE — Progress Notes (Signed)
ID: Mercedes Thornton OB: 03/22/1946  MR#: [id number]  CSN#:[id number]  PCP: Quitman Livings, MD   DIAGNOSIS: reactive neutrophil-predominant leukocytosis; Bone marrow biopsy on 11/11/2011 was negative for myeloproliferative disease. Cytogenetics were negative.   CURRENT THERAPY: Watchful observation.   INTERVAL HISTORY:  Mercedes Thornton 67 y.o. female returns for routine follow-up visit. She reports feeling well. She  has chronic mostly nonproductive but sometimes with clear sputum cough. Occasionally patient has dyspnea.  She complains on nasal congestion and post nasal drip. The patient denied fever, chills, night sweats, change in appetite or weight. She denied headaches, double vision, blurry vision,  nasal discharge, odynophagia or dysphagia. No chest pain, palpitations,abdominal pain, nausea, vomiting, diarrhea, constipation, hematochezia. The patient denied dysuria, nocturia, polyuria, hematuria, myalgia, numbness, tingling. She has bipolar disorder.  Review of Systems  Constitutional: Negative for fever, chills, weight loss, malaise/fatigue and diaphoresis.  HENT: Negative for ear pain, nosebleeds, congestion, sore throat, neck pain and tinnitus.   Eyes: Negative for blurred vision, double vision, photophobia and pain.  Respiratory: Positive for cough, sputum production and shortness of breath. Negative for hemoptysis and wheezing.   Cardiovascular: Negative for chest pain, palpitations, orthopnea, claudication, leg swelling and PND.  Gastrointestinal: Negative for heartburn, nausea, vomiting, abdominal pain, diarrhea, constipation, blood in stool and melena.  Genitourinary: Negative for dysuria, urgency, frequency, hematuria and flank pain.  Musculoskeletal: Negative for myalgias, back pain, joint pain and falls.  Skin: Negative for itching and rash.  Neurological: Negative for dizziness, tingling, tremors, sensory change, speech change, focal weakness, seizures, loss of consciousness, weakness and  headaches.  Psychiatric/Behavioral:       Bipolar disorder   PAST MEDICAL HISTORY: Past Medical History  Diagnosis Date  . Leukocytosis   . COPD (chronic obstructive pulmonary disease)   . Allergic rhinitis   . DM (diabetes mellitus)   . Bipolar affective disorder   . Hyperlipidemia   . Smoking   . Cough 10/30/2011  . SOB (shortness of breath) 10/30/2011  . Mediastinal adenopathy 11/26/2011    PAST SURGICAL HISTORY: Past Surgical History  Procedure Laterality Date  . Cataract extraction    . Cesarean section    . Colonoscopy  2008    negative per pt's report, with [facility]     FAMILY HISTORY Family History  Problem Relation Age of Onset  . Stroke Mother   . Cancer Father     prostate    HEALTH MAINTENANCE: History  Substance Use Topics  . Smoking status: Current Every Day Smoker -- 1.00 packs/day for 40 years  . Smokeless tobacco: Not on file  . Alcohol Use: No   Allergies  Allergen Reactions  . Penicillins Swelling    Current Outpatient Prescriptions  Medication Sig Dispense Refill  . albuterol (PROVENTIL HFA;VENTOLIN HFA) 108 (90 BASE) MCG/ACT inhaler Inhale 2 puffs into the lungs every 6 (six) hours as needed for wheezing.      . cetirizine (ZYRTEC) 10 MG tablet Take 10 mg by mouth daily.      . Cinnamon 500 MG TABS Take by mouth daily.      . clonazePAM (KLONOPIN) 0.5 MG tablet Take 0.5 mg by mouth daily as needed. One per day or as needed.      Marland Kitchen Cod Liver Oil CAPS Take by mouth daily.      . diazepam (VALIUM) 5 MG tablet 5 mg. As needed prior to dental work only      . ezetimibe (ZETIA) 10 MG tablet Take 10 mg by mouth  daily.      . Fenofibrate (TRICOR PO) Take by mouth daily.      . fish oil-omega-3 fatty acids 1000 MG capsule Take 1 g by mouth daily.      Marland Kitchen glimepiride (AMARYL) 2 MG tablet Take 2 mg by mouth daily before breakfast.      . metFORMIN (GLUCOPHAGE) 500 MG tablet Take 1,000 mg by mouth 2 (two) times daily with a meal.      . Multiple  Vitamins-Minerals (WOMENS MULTIVITAMIN PLUS PO) Take 1 tablet by mouth daily.       . phentermine 37.5 MG capsule Take 37.5 mg by mouth every morning.      . vitamin C (ASCORBIC ACID) 500 MG tablet Take 500 mg by mouth daily.       No current facility-administered medications for this visit.    OBJECTIVE: There were no vitals filed for this visit.   There is no weight on file to calculate BMI.    ECOG FS:1  HEENT: Sclerae anicteric.  Conjunctivae were pink. Pupils round and reactive bilaterally. Oral mucosa is moist without ulceration or thrush. No occipital, submandibular, cervical, supraclavicular or axillar adenopathy. Lungs: clear to auscultation without wheezes. No rales or rhonchi. Heart: regular rate and rhythm. No murmur, gallop or rubs. Abdomen: soft, non tender. No guarding or rebound tenderness. Bowel sounds are present. No palpable hepatosplenomegaly. MSK: no focal spinal tenderness. Extremities: No clubbing or cyanosis.No calf tenderness to palpitation, no peripheral edema. The patient had grossly intact strength in upper and lower extremities Neuro: non-focal, alert and oriented to time, person and place, appropriate affect  LAB RESULTS:  CMP     Component Value Date/Time   NA 141 11/26/2012 0906   K 3.8 11/26/2012 0906   CL 105 06/02/2012 1140   CO2 23 11/26/2012 0906   GLUCOSE 178* 11/26/2012 0906   GLUCOSE 89 06/02/2012 1140   BUN 11.0 11/26/2012 0906   BUN 11 11/11/2011 0800   CREATININE 0.8 11/26/2012 0906   CREATININE 0.62 11/11/2011 0800   CALCIUM 9.8 11/26/2012 0906   PROT 7.9 11/26/2012 0906   ALBUMIN 3.3* 11/26/2012 0906   AST 15 11/26/2012 0906   ALT 12 11/26/2012 0906   ALKPHOS 61 11/26/2012 0906   BILITOT 0.31 11/26/2012 0906   GFRNONAA >90 11/11/2011 0800   GFRAA >90 11/11/2011 0800    Lab Results  Component Value Date   WBC 16.2* 11/26/2012   NEUTROABS 11.7* 11/26/2012   HGB 14.1 11/26/2012   HCT 43.2 11/26/2012   MCV 86.7 11/26/2012   PLT 410* 11/26/2012       Chemistry      Component Value Date/Time   NA 141 11/26/2012 0906   K 3.8 11/26/2012 0906   CL 105 06/02/2012 1140   CO2 23 11/26/2012 0906   BUN 11.0 11/26/2012 0906   BUN 11 11/11/2011 0800   CREATININE 0.8 11/26/2012 0906   CREATININE 0.62 11/11/2011 0800      Component Value Date/Time   CALCIUM 9.8 11/26/2012 0906   ALKPHOS 61 11/26/2012 0906   AST 15 11/26/2012 0906   ALT 12 11/26/2012 0906   BILITOT 0.31 11/26/2012 0906      STUDIES: No results found.  ASSESSMENT AND PLAN:  1.  Leukocytosis: Most likely secondary to smoking.  - Work up with bone marrow biopsy was negative for myeloproliferative disease. Cytogenetics were negative. I cannot rule out chronic neutrophilia which normally has a benign course.  2.Smoking. Recommend to stop smoking. I  advised her to let us know if she needs assistance. 3. Cough: Most likely smoker cough/bronchtis. CT chest was negative for lung mass. There were mild mediastinal and hilar LAD. Most likely these are reactive.  4. Mediastinal/hilar adenopathy: See #3. 5. Pulmonary nodule , 3 mm. We will follow with LDCT. 6. Mild dyspnea on exertion.Most likely COPD secondary to smoking. 7. Mammogram was done in September 2014. We will follow next mammogram. 8. Follow up: CBC with the Cancer Center in about 3 months. Lab and return visit in about 6 months. I advised her to contact us in the interval with concerning issues.    Myra Rude, MD   11/26/2012 10:26 AM

## 2012-11-26 NOTE — Telephone Encounter (Signed)
gv and pritned appt sched and avs fo rpt. °

## 2013-02-10 ENCOUNTER — Telehealth: Payer: Self-pay | Admitting: Hematology and Oncology

## 2013-02-10 NOTE — Telephone Encounter (Signed)
sw pt made aware of 11/13 appt w NG shh

## 2013-02-17 ENCOUNTER — Other Ambulatory Visit: Payer: Self-pay

## 2013-02-24 ENCOUNTER — Telehealth: Payer: Self-pay | Admitting: Hematology and Oncology

## 2013-02-24 ENCOUNTER — Other Ambulatory Visit: Payer: Self-pay | Admitting: Hematology and Oncology

## 2013-02-24 ENCOUNTER — Other Ambulatory Visit (HOSPITAL_BASED_OUTPATIENT_CLINIC_OR_DEPARTMENT_OTHER): Payer: Medicare Other | Admitting: Lab

## 2013-02-24 ENCOUNTER — Encounter: Payer: Self-pay | Admitting: Hematology and Oncology

## 2013-02-24 ENCOUNTER — Ambulatory Visit (HOSPITAL_BASED_OUTPATIENT_CLINIC_OR_DEPARTMENT_OTHER): Payer: Medicare Other | Admitting: Hematology and Oncology

## 2013-02-24 VITALS — BP 120/70 | HR 88 | Temp 98.4°F | Resp 18 | Ht 64.0 in | Wt 174.8 lb

## 2013-02-24 DIAGNOSIS — F17200 Nicotine dependence, unspecified, uncomplicated: Secondary | ICD-10-CM

## 2013-02-24 DIAGNOSIS — J449 Chronic obstructive pulmonary disease, unspecified: Secondary | ICD-10-CM

## 2013-02-24 DIAGNOSIS — D473 Essential (hemorrhagic) thrombocythemia: Secondary | ICD-10-CM

## 2013-02-24 DIAGNOSIS — D72829 Elevated white blood cell count, unspecified: Secondary | ICD-10-CM

## 2013-02-24 DIAGNOSIS — R590 Localized enlarged lymph nodes: Secondary | ICD-10-CM

## 2013-02-24 LAB — CBC WITH DIFFERENTIAL/PLATELET
Eosinophils Absolute: 0.1 10*3/uL (ref 0.0–0.5)
LYMPH%: 20.8 % (ref 14.0–49.7)
MONO#: 1 10*3/uL — ABNORMAL HIGH (ref 0.1–0.9)
NEUT#: 10.4 10*3/uL — ABNORMAL HIGH (ref 1.5–6.5)
Platelets: 409 10*3/uL — ABNORMAL HIGH (ref 145–400)
RBC: 5.23 10*6/uL (ref 3.70–5.45)
WBC: 14.6 10*3/uL — ABNORMAL HIGH (ref 3.9–10.3)

## 2013-02-24 LAB — TECHNOLOGIST REVIEW

## 2013-02-24 LAB — SEDIMENTATION RATE: Sed Rate: 13 mm/hr (ref 0–22)

## 2013-02-24 NOTE — Progress Notes (Signed)
[facility] OFFICE PROGRESS NOTE  HASSAN,SAMI, MD DIAGNOSIS:  Reactive leukocytosis, negative bone marrow aspirate and biopsy in July 2013.  SUMMARY OF HEMATOLOGIC HISTORY: This is a pleasant 67 year old lady with chronic leukocytosis, thought to be related to her chronic bronchitis/COPD from smoking. INTERVAL HISTORY: Mercedes Thornton 67 y.o. female returns for further followup. Since she was here, she was able to reduce her smoking from one pack to half a pack a day. The patient is trying to quit on her own. She continued to have chronic bronchitis with productive cough in the morning. It is clipped. She denies any hemoptysis. She denies any recent fever, chills, night sweats or abnormal weight loss  I have reviewed the past medical history, past surgical history, social history and family history with the patient and they are unchanged from previous note.  ALLERGIES:  is allergic to penicillins.  MEDICATIONS:  Current Outpatient Prescriptions  Medication Sig Dispense Refill  . cetirizine (ZYRTEC) 10 MG tablet Take 10 mg by mouth daily.      . Cinnamon 500 MG TABS Take by mouth daily.      . clonazePAM (KLONOPIN) 0.5 MG tablet Take 0.5 mg by mouth daily as needed. One per day or as needed.      Marland Kitchen Cod Liver Oil CAPS Take by mouth daily.      . diazepam (VALIUM) 5 MG tablet 5 mg. As needed prior to dental work only      . ezetimibe (ZETIA) 10 MG tablet Take 10 mg by mouth daily.      . Fenofibrate (TRICOR PO) Take by mouth daily.      . fish oil-omega-3 fatty acids 1000 MG capsule Take 1 g by mouth daily.      Marland Kitchen glimepiride (AMARYL) 2 MG tablet Take 2 mg by mouth daily before breakfast.      . metFORMIN (GLUCOPHAGE) 500 MG tablet Take 500 mg by mouth 2 (two) times daily with a meal.       . Multiple Vitamins-Minerals (WOMENS MULTIVITAMIN PLUS PO) Take 1 tablet by mouth daily.       . vitamin C (ASCORBIC ACID) 500 MG tablet Take 500 mg by mouth daily.      Marland Kitchen albuterol  (PROVENTIL HFA;VENTOLIN HFA) 108 (90 BASE) MCG/ACT inhaler Inhale 2 puffs into the lungs every 6 (six) hours as needed for wheezing.       No current facility-administered medications for this visit.     REVIEW OF SYSTEMS:   Constitutional: Denies fevers, chills or night sweats All other systems were reviewed with the patient and are negative.  PHYSICAL EXAMINATION: ECOG PERFORMANCE STATUS: 0 - Asymptomatic  Filed Vitals:   02/24/13 1434  BP: 120/70  Pulse: 88  Temp: 98.4 F (36.9 C)  Resp: 18   Filed Weights   02/24/13 1434  Weight: 174 lb 12.8 oz (79.289 kg)    GENERAL:alert, no distress and comfortable SKIN: skin color, texture, turgor are normal, no rashes or significant lesions EYES: normal, Conjunctiva are pink and non-injected, sclera clear OROPHARYNX:no exudate, no erythema and lips, buccal mucosa, and tongue normal  NECK: supple, thyroid normal size, non-tender, without nodularity LYMPH:  no palpable lymphadenopathy in the cervical, axillary or inguinal LUNGS: clear to auscultation and percussion with normal breathing effort HEART: regular rate & rhythm and no murmurs and no lower extremity edema ABDOMEN:abdomen soft, non-tender and normal bowel sounds Musculoskeletal:no cyanosis of digits and no clubbing  NEURO: alert & oriented x 3 with  fluent speech, no focal motor/sensory deficits  LABORATORY DATA:  I have reviewed the data as listed Results for orders placed in visit on 02/24/13 (from the past 48 hour(s))  CBC WITH DIFFERENTIAL     Status: Abnormal   Collection Time    02/24/13  2:14 PM      Result Value Range   WBC 14.6 (*) 3.9 - 10.3 10e3/uL   NEUT# 10.4 (*) 1.5 - 6.5 10e3/uL   HGB 15.1  11.6 - 15.9 g/dL   HCT 62.1  30.8 - 65.7 %   Platelets 409 (*) 145 - 400 10e3/uL   MCV 88.1  79.5 - 101.0 fL   MCH 28.9  25.1 - 34.0 pg   MCHC 32.8  31.5 - 36.0 g/dL   RBC 8.46  9.62 - 9.52 10e6/uL   RDW 15.6 (*) 11.2 - 14.5 %   lymph# 3.0  0.9 - 3.3 10e3/uL    MONO# 1.0 (*) 0.1 - 0.9 10e3/uL   Eosinophils Absolute 0.1  0.0 - 0.5 10e3/uL   Basophils Absolute 0.1  0.0 - 0.1 10e3/uL   NEUT% 71.3  38.4 - 76.8 %   LYMPH% 20.8  14.0 - 49.7 %   MONO% 6.9  0.0 - 14.0 %   EOS% 0.5  0.0 - 7.0 %   BASO% 0.5  0.0 - 2.0 %  TECHNOLOGIST REVIEW     Status: None   Collection Time    02/24/13  2:14 PM      Result Value Range   Technologist Review occ myelo       ASSESSMENT:  #1 chronic leukocytosis  #2 chronic smoking #3 COPD with chronic bronchitis  PLAN:  #1 chronic leukocytosis  #2 chronic smoking #3 COPD with chronic bronchitis Overall, I think her chronic leukocytosis is due to her smoking, bronchitis and COPD. The patient has multiple lung nodules observe from previous CT scan. I reviewed them myself. I recommend the patient continue on her effort with nicotine cessation. She agreed. I would like to repeat another CT scan of the chest without contrast in 6 months when I see her back to followup on the lung nodule and the mediastinal lymphadenopathy. Given her history of smoking, I want to make sure they were no progression. She agreed. #4 thrombocytosis This is again related to her smoking and chronic bronchitis. We will observe for now. I recommend the patient to consider taking aspirin. The patient felt that she might be allergic. She declined taking it for now.  All questions were answered. The patient knows to call the clinic with any problems, questions or concerns. No barriers to learning was detected.  I spent 25 minutes counseling the patient face to face. The total time spent in the appointment was 40 minutes and more than 50% was on counseling.     [facility], Esten Dollar, MD 02/24/2013 3:58 PM

## 2013-02-24 NOTE — Telephone Encounter (Signed)
worked 11/13 POF avs printed for pt shh

## 2013-08-19 ENCOUNTER — Ambulatory Visit (HOSPITAL_COMMUNITY)
Admission: RE | Admit: 2013-08-19 | Discharge: 2013-08-19 | Disposition: A | Discharge status: Home / Self Care | Payer: Medicare Other | Source: Ambulatory Visit | Attending: Hematology and Oncology | Admitting: Hematology and Oncology

## 2013-08-19 DIAGNOSIS — D72829 Elevated white blood cell count, unspecified: Secondary | ICD-10-CM

## 2013-08-19 DIAGNOSIS — R590 Localized enlarged lymph nodes: Secondary | ICD-10-CM

## 2013-08-19 DIAGNOSIS — F17200 Nicotine dependence, unspecified, uncomplicated: Secondary | ICD-10-CM | POA: Insufficient documentation

## 2013-08-19 DIAGNOSIS — R599 Enlarged lymph nodes, unspecified: Secondary | ICD-10-CM | POA: Insufficient documentation

## 2013-08-19 DIAGNOSIS — R911 Solitary pulmonary nodule: Secondary | ICD-10-CM | POA: Insufficient documentation

## 2013-08-24 ENCOUNTER — Ambulatory Visit (HOSPITAL_BASED_OUTPATIENT_CLINIC_OR_DEPARTMENT_OTHER): Payer: Medicare Other | Admitting: Hematology and Oncology

## 2013-08-24 ENCOUNTER — Telehealth: Payer: Self-pay | Admitting: Hematology and Oncology

## 2013-08-24 ENCOUNTER — Other Ambulatory Visit (HOSPITAL_BASED_OUTPATIENT_CLINIC_OR_DEPARTMENT_OTHER): Payer: Medicare Other

## 2013-08-24 ENCOUNTER — Encounter: Payer: Self-pay | Admitting: Hematology and Oncology

## 2013-08-24 VITALS — BP 114/67 | HR 76 | Temp 97.8°F | Resp 18 | Ht 64.0 in | Wt 179.9 lb

## 2013-08-24 DIAGNOSIS — R599 Enlarged lymph nodes, unspecified: Secondary | ICD-10-CM

## 2013-08-24 DIAGNOSIS — F17200 Nicotine dependence, unspecified, uncomplicated: Secondary | ICD-10-CM

## 2013-08-24 DIAGNOSIS — D72829 Elevated white blood cell count, unspecified: Secondary | ICD-10-CM

## 2013-08-24 DIAGNOSIS — R590 Localized enlarged lymph nodes: Secondary | ICD-10-CM

## 2013-08-24 DIAGNOSIS — R911 Solitary pulmonary nodule: Secondary | ICD-10-CM

## 2013-08-24 DIAGNOSIS — J449 Chronic obstructive pulmonary disease, unspecified: Secondary | ICD-10-CM

## 2013-08-24 LAB — CBC WITH DIFFERENTIAL/PLATELET
BASO%: 1.1 % (ref 0.0–2.0)
Basophils Absolute: 0.2 10*3/uL — ABNORMAL HIGH (ref 0.0–0.1)
EOS%: 1.2 % (ref 0.0–7.0)
Eosinophils Absolute: 0.2 10*3/uL (ref 0.0–0.5)
HCT: 46.3 % (ref 34.8–46.6)
HGB: 14.8 g/dL (ref 11.6–15.9)
LYMPH%: 16.4 % (ref 14.0–49.7)
MCH: 28.5 pg (ref 25.1–34.0)
MCHC: 32 g/dL (ref 31.5–36.0)
MCV: 89 fL (ref 79.5–101.0)
MONO#: 1.1 10*3/uL — ABNORMAL HIGH (ref 0.1–0.9)
MONO%: 7.6 % (ref 0.0–14.0)
NEUT#: 11 10*3/uL — ABNORMAL HIGH (ref 1.5–6.5)
NEUT%: 73.7 % (ref 38.4–76.8)
PLATELETS: 343 10*3/uL (ref 145–400)
RBC: 5.21 10*6/uL (ref 3.70–5.45)
RDW: 14.9 % — ABNORMAL HIGH (ref 11.2–14.5)
WBC: 14.9 10*3/uL — ABNORMAL HIGH (ref 3.9–10.3)
lymph#: 2.4 10*3/uL (ref 0.9–3.3)

## 2013-08-24 LAB — SEDIMENTATION RATE: Sed Rate: 20 mm/hr (ref 0–22)

## 2013-08-24 NOTE — Progress Notes (Signed)
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 Bone marrow biopsy from 2002 was negative. Due to smoking history, she had abnormal CT scan with lung nodules that is being followed. INTERVAL HISTORY: Mercedes Thornton 68 y.o. female returns for further followup. She continued to have occasional coughing from COPD. She is attempting to quit smoking and down to about 3 cigarettes per day. She denies any recent fever, chills, night sweats or abnormal weight loss Denies recent infection.  I have reviewed the past medical history, past surgical history, social history and family history with the patient and they are unchanged from previous note.  ALLERGIES:  is allergic to penicillins.  MEDICATIONS:  Current Outpatient Prescriptions  Medication Sig Dispense Refill  . albuterol (PROVENTIL HFA;VENTOLIN HFA) 108 (90 BASE) MCG/ACT inhaler Inhale 2 puffs into the lungs every 6 (six) hours as needed for wheezing.      . cetirizine (ZYRTEC) 10 MG tablet Take 10 mg by mouth daily.      . Cholecalciferol (VITAMIN D3) 3000 UNITS TABS Take by mouth.      . Cinnamon 500 MG TABS Take by mouth daily.      . clonazePAM (KLONOPIN) 0.5 MG tablet Take 0.5 mg by mouth daily as needed. One per day or as needed.      Marland Kitchen Cod Liver Oil CAPS Take by mouth daily.      . diazepam (VALIUM) 5 MG tablet 5 mg. As needed prior to dental work only      . fluticasone (FLONASE) 50 MCG/ACT nasal spray       . glimepiride (AMARYL) 2 MG tablet Take 2 mg by mouth daily before breakfast.      . lisinopril (PRINIVIL,ZESTRIL) 5 MG tablet       . lovastatin (MEVACOR) 20 MG tablet       . metFORMIN (GLUCOPHAGE) 500 MG tablet Take 500 mg by mouth 2 (two) times daily with a meal.       . Multiple  Vitamins-Minerals (WOMENS MULTIVITAMIN PLUS PO) Take 1 tablet by mouth daily.       . vitamin C (ASCORBIC ACID) 500 MG tablet Take 500 mg by mouth daily.       No current facility-administered medications for this visit.     REVIEW OF SYSTEMS:   Constitutional: Denies fevers, chills or night sweats Eyes: Denies blurriness of vision Ears, nose, mouth, throat, and face: Denies mucositis or sore throat Cardiovascular: Denies palpitation, chest discomfort or lower extremity swelling Gastrointestinal:  Denies nausea, heartburn or change in bowel habits Skin: Denies abnormal skin rashes Lymphatics: Denies new lymphadenopathy or easy bruising Neurological:Denies numbness, tingling or new weaknesses Behavioral/Psych: Mood is stable, no new changes  All other systems were reviewed with the patient and are negative.  PHYSICAL EXAMINATION: ECOG PERFORMANCE STATUS: 0 - Asymptomatic  Filed Vitals:   08/24/13 1242  BP: 114/67  Pulse: 76  Temp: 97.8 F (36.6 C)  Resp: 18   Filed Weights   08/24/13 1242  Weight: 179 lb 14.4 oz (81.602 kg)    GENERAL:alert, no distress and comfortable SKIN: skin color, texture, turgor are normal, no rashes or significant lesions EYES: normal, Conjunctiva are pink and non-injected, sclera clear OROPHARYNX:no exudate, no erythema and lips, buccal mucosa, and tongue normal  NECK: supple, thyroid normal size,  non-tender, without nodularity LYMPH:  no palpable lymphadenopathy in the cervical, axillary or inguinal LUNGS: Mild crackles at the lung bases.  HEART: regular rate & rhythm and no murmurs and no lower extremity edema ABDOMEN:abdomen soft, non-tender and normal bowel sounds Musculoskeletal:no cyanosis of digits and no clubbing  NEURO: alert & oriented x 3 with fluent speech, no focal motor/sensory deficits  LABORATORY DATA:  I have reviewed the data as listed Results for orders placed in visit on 08/24/13 (from the past 48 hour(s))  CBC WITH  DIFFERENTIAL     Status: Abnormal   Collection Time    08/24/13 12:33 PM      Result Value Ref Range   WBC 14.9 (*) 3.9 - 10.3 10e3/uL   NEUT# 11.0 (*) 1.5 - 6.5 10e3/uL   HGB 14.8  11.6 - 15.9 g/dL   HCT 46.3  34.8 - 46.6 %   Platelets 343  145 - 400 10e3/uL   MCV 89.0  79.5 - 101.0 fL   MCH 28.5  25.1 - 34.0 pg   MCHC 32.0  31.5 - 36.0 g/dL   RBC 5.21  3.70 - 5.45 10e6/uL   RDW 14.9 (*) 11.2 - 14.5 %   lymph# 2.4  0.9 - 3.3 10e3/uL   MONO# 1.1 (*) 0.1 - 0.9 10e3/uL   Eosinophils Absolute 0.2  0.0 - 0.5 10e3/uL   Basophils Absolute 0.2 (*) 0.0 - 0.1 10e3/uL   NEUT% 73.7  38.4 - 76.8 %   LYMPH% 16.4  14.0 - 49.7 %   MONO% 7.6  0.0 - 14.0 %   EOS% 1.2  0.0 - 7.0 %   BASO% 1.1  0.0 - 2.0 %    Lab Results  Component Value Date   WBC 14.9* 08/24/2013   HGB 14.8 08/24/2013   HCT 46.3 08/24/2013   MCV 89.0 08/24/2013   PLT 343 08/24/2013    RADIOGRAPHIC STUDIES: I reviewed the CT scan with her. I have personally reviewed the radiological images as listed and agreed with the findings in the report. ASSESSMENT & PLAN:  #1 chronic leukocytosis  #2 chronic smoking #3 COPD with chronic bronchitis Overall, I think her chronic leukocytosis is due to her smoking, bronchitis and COPD. #4 mediastinal lymphadenopathy with lung nodules The patient has multiple lung nodules observe from previous CT scan. I reviewed them myself. I recommend the patient continue on her effort with nicotine cessation. She agreed. I would like to repeat another CT scan of the chest without contrast in 1 yr when I see her back to followup on the lung nodule and the mediastinal lymphadenopathy. Given her history of smoking, I want to make sure they were no progression. She agreed.  All questions were answered. The patient knows to call the clinic with any problems, questions or concerns. No barriers to learning was detected.  I spent 25 minutes counseling the patient face to face. The total time spent in the  appointment was 30 minutes and more than 50% was on counseling.     Heath Lark, MD 08/24/2013 2:22 PM

## 2013-08-24 NOTE — Telephone Encounter (Signed)
Gave pt appt for lab and Md for May 2016

## 2013-10-17 ENCOUNTER — Telehealth: Payer: Self-pay | Admitting: Hematology and Oncology

## 2013-10-17 NOTE — Telephone Encounter (Signed)
Faxed pt medical records to [facility]

## 2013-11-11 ENCOUNTER — Other Ambulatory Visit (HOSPITAL_COMMUNITY): Payer: Self-pay | Admitting: Internal Medicine

## 2013-11-11 DIAGNOSIS — Z1231 Encounter for screening mammogram for malignant neoplasm of breast: Secondary | ICD-10-CM

## 2013-12-01 ENCOUNTER — Ambulatory Visit (HOSPITAL_COMMUNITY)
Admission: RE | Admit: 2013-12-01 | Discharge: 2013-12-01 | Disposition: A | Discharge status: Home / Self Care | Payer: Medicare Other | Source: Ambulatory Visit | Attending: Internal Medicine | Admitting: Internal Medicine

## 2013-12-01 DIAGNOSIS — Z1231 Encounter for screening mammogram for malignant neoplasm of breast: Secondary | ICD-10-CM | POA: Diagnosis not present

## 2013-12-05 ENCOUNTER — Other Ambulatory Visit: Payer: Self-pay | Admitting: Internal Medicine

## 2013-12-05 DIAGNOSIS — R928 Other abnormal and inconclusive findings on diagnostic imaging of breast: Secondary | ICD-10-CM

## 2013-12-12 ENCOUNTER — Ambulatory Visit
Admission: RE | Admit: 2013-12-12 | Discharge: 2013-12-12 | Disposition: A | Discharge status: Home / Self Care | Payer: Medicare Other | Source: Ambulatory Visit | Attending: Internal Medicine | Admitting: Internal Medicine

## 2013-12-12 DIAGNOSIS — R928 Other abnormal and inconclusive findings on diagnostic imaging of breast: Secondary | ICD-10-CM

## 2014-08-24 ENCOUNTER — Ambulatory Visit (HOSPITAL_COMMUNITY)
Admission: RE | Admit: 2014-08-24 | Discharge: 2014-08-24 | Disposition: A | Discharge status: Home / Self Care | Payer: Medicare Other | Source: Ambulatory Visit | Attending: Hematology and Oncology | Admitting: Hematology and Oncology

## 2014-08-24 ENCOUNTER — Encounter (HOSPITAL_COMMUNITY): Payer: Self-pay

## 2014-08-24 DIAGNOSIS — J449 Chronic obstructive pulmonary disease, unspecified: Secondary | ICD-10-CM | POA: Diagnosis not present

## 2014-08-24 DIAGNOSIS — F17210 Nicotine dependence, cigarettes, uncomplicated: Secondary | ICD-10-CM | POA: Insufficient documentation

## 2014-08-24 DIAGNOSIS — E785 Hyperlipidemia, unspecified: Secondary | ICD-10-CM | POA: Insufficient documentation

## 2014-08-24 DIAGNOSIS — R0602 Shortness of breath: Secondary | ICD-10-CM | POA: Diagnosis not present

## 2014-08-24 DIAGNOSIS — R911 Solitary pulmonary nodule: Secondary | ICD-10-CM

## 2014-08-24 DIAGNOSIS — F17200 Nicotine dependence, unspecified, uncomplicated: Secondary | ICD-10-CM

## 2014-08-25 ENCOUNTER — Telehealth: Payer: Self-pay | Admitting: Hematology and Oncology

## 2014-08-25 ENCOUNTER — Encounter: Payer: Self-pay | Admitting: Hematology and Oncology

## 2014-08-25 ENCOUNTER — Other Ambulatory Visit (HOSPITAL_BASED_OUTPATIENT_CLINIC_OR_DEPARTMENT_OTHER): Payer: Medicare Other

## 2014-08-25 ENCOUNTER — Ambulatory Visit (HOSPITAL_BASED_OUTPATIENT_CLINIC_OR_DEPARTMENT_OTHER): Payer: Medicare Other | Admitting: Hematology and Oncology

## 2014-08-25 ENCOUNTER — Other Ambulatory Visit: Payer: Medicare Other

## 2014-08-25 VITALS — BP 134/67 | HR 94 | Temp 98.1°F | Resp 18 | Ht 64.0 in | Wt 177.7 lb

## 2014-08-25 DIAGNOSIS — F17200 Nicotine dependence, unspecified, uncomplicated: Secondary | ICD-10-CM

## 2014-08-25 DIAGNOSIS — J449 Chronic obstructive pulmonary disease, unspecified: Secondary | ICD-10-CM | POA: Diagnosis not present

## 2014-08-25 DIAGNOSIS — D72829 Elevated white blood cell count, unspecified: Secondary | ICD-10-CM | POA: Diagnosis present

## 2014-08-25 DIAGNOSIS — R599 Enlarged lymph nodes, unspecified: Secondary | ICD-10-CM | POA: Diagnosis not present

## 2014-08-25 DIAGNOSIS — R590 Localized enlarged lymph nodes: Secondary | ICD-10-CM

## 2014-08-25 DIAGNOSIS — R911 Solitary pulmonary nodule: Secondary | ICD-10-CM | POA: Diagnosis not present

## 2014-08-25 DIAGNOSIS — Z720 Tobacco use: Secondary | ICD-10-CM | POA: Diagnosis not present

## 2014-08-25 LAB — CBC WITH DIFFERENTIAL/PLATELET
BASO%: 0.9 % (ref 0.0–2.0)
BASOS ABS: 0.1 10*3/uL (ref 0.0–0.1)
EOS ABS: 0.3 10*3/uL (ref 0.0–0.5)
EOS%: 2 % (ref 0.0–7.0)
HCT: 46.2 % (ref 34.8–46.6)
HEMOGLOBIN: 15 g/dL (ref 11.6–15.9)
LYMPH#: 2.1 10*3/uL (ref 0.9–3.3)
LYMPH%: 14 % (ref 14.0–49.7)
MCH: 28.7 pg (ref 25.1–34.0)
MCHC: 32.6 g/dL (ref 31.5–36.0)
MCV: 88.2 fL (ref 79.5–101.0)
MONO#: 1.1 10*3/uL — ABNORMAL HIGH (ref 0.1–0.9)
MONO%: 7.1 % (ref 0.0–14.0)
NEUT#: 11.3 10*3/uL — ABNORMAL HIGH (ref 1.5–6.5)
NEUT%: 76 % (ref 38.4–76.8)
PLATELETS: 342 10*3/uL (ref 145–400)
RBC: 5.23 10*6/uL (ref 3.70–5.45)
RDW: 15.4 % — AB (ref 11.2–14.5)
WBC: 14.9 10*3/uL — ABNORMAL HIGH (ref 3.9–10.3)

## 2014-08-25 NOTE — Telephone Encounter (Signed)
Pt confirmed labs/ov per 05/13 POF, gave pt AVS and Calendar.... KJ

## 2014-08-26 NOTE — Progress Notes (Signed)
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 Bone marrow biopsy from 2002 was negative. Due to smoking history, she had abnormal CT scan with lung nodules that is being followed. INTERVAL HISTORY: Mercedes Thornton 69 y.o. female returns for follow-up She continues to smoke She has mild URI with congestions and mild voice change recently Non productive cought  I have reviewed the past medical history, past surgical history, social history and family history with the patient and they are unchanged from previous note.  ALLERGIES:  is allergic to penicillins.  MEDICATIONS:  Current Outpatient Prescriptions  Medication Sig Dispense Refill  . albuterol (PROVENTIL HFA;VENTOLIN HFA) 108 (90 BASE) MCG/ACT inhaler Inhale 2 puffs into the lungs every 6 (six) hours as needed for wheezing.    Marland Kitchen aspirin 81 MG tablet Take 81 mg by mouth daily.    . cetirizine (ZYRTEC) 10 MG tablet Take 10 mg by mouth daily.    . Cholecalciferol (VITAMIN D3) 3000 UNITS TABS Take by mouth.    . Cinnamon 500 MG TABS Take by mouth daily.    . clonazePAM (KLONOPIN) 0.5 MG tablet Take 0.5 mg by mouth daily as needed. One per day or as needed.    Marland Kitchen Cod Liver Oil CAPS Take by mouth daily.    . fluticasone (FLONASE) 50 MCG/ACT nasal spray     . glimepiride (AMARYL) 2 MG tablet Take 2 mg by mouth daily before breakfast.    . lisinopril (PRINIVIL,ZESTRIL) 5 MG tablet     . lovastatin (MEVACOR) 20 MG tablet     . metFORMIN (GLUCOPHAGE) 500 MG tablet Take 500 mg by mouth 2 (two) times daily with a meal.     . Multiple Vitamins-Minerals (WOMENS MULTIVITAMIN PLUS PO) Take 1 tablet by mouth daily.     Marland Kitchen UNABLE TO FIND Med Name:  Tumeric    . vitamin C (ASCORBIC ACID) 500 MG tablet  Take 500 mg by mouth daily.     No current facility-administered medications for this visit.     REVIEW OF SYSTEMS:   Constitutional: Denies fevers, chills or night sweats Eyes: Denies blurriness of vision Ears, nose, mouth, throat, and face: Denies mucositis or sore throat  Cardiovascular: Denies palpitation, chest discomfort or lower extremity swelling Gastrointestinal:  Denies nausea, heartburn or change in bowel habits Skin: Denies abnormal skin rashes Lymphatics: Denies new lymphadenopathy or easy bruising Neurological:Denies numbness, tingling or new weaknesses Behavioral/Psych: Mood is stable, no new changes  All other systems were reviewed with the patient and are negative.  PHYSICAL EXAMINATION: ECOG PERFORMANCE STATUS: 1 - Symptomatic but completely ambulatory  Filed Vitals:   08/25/14 1308  BP: 134/67  Pulse: 94  Temp: 98.1 F (36.7 C)  Resp: 18   Filed Weights   08/25/14 1308  Weight: 177 lb 11.2 oz (80.604 kg)    GENERAL:alert, no distress and comfortable SKIN: skin color, texture, turgor are normal, no rashes or significant lesions EYES: normal, Conjunctiva are pink and non-injected, sclera clear OROPHARYNX:no exudate, no erythema and lips, buccal mucosa, and tongue normal  NECK: supple, thyroid normal size, non-tender, without nodularity LYMPH:  no palpable lymphadenopathy in the cervical, axillary or inguinal LUNGS: clear to auscultation and percussion with normal breathing effort HEART: regular rate & rhythm and no murmurs and no  lower extremity edema ABDOMEN:abdomen soft, non-tender and normal bowel sounds Musculoskeletal:no cyanosis of digits and no clubbing  NEURO: alert & oriented x 3 with fluent speech, no focal motor/sensory deficits  LABORATORY DATA:  I have reviewed the data as listed Results for orders placed or performed in visit on 08/25/14 (from the past 48 hour(s))  CBC with Differential     Status: Abnormal   Collection Time: 08/25/14   1:06 PM  Result Value Ref Range   WBC 14.9 (H) 3.9 - 10.3 10e3/uL   NEUT# 11.3 (H) 1.5 - 6.5 10e3/uL   HGB 15.0 11.6 - 15.9 g/dL   HCT 46.2 34.8 - 46.6 %   Platelets 342 145 - 400 10e3/uL   MCV 88.2 79.5 - 101.0 fL   MCH 28.7 25.1 - 34.0 pg   MCHC 32.6 31.5 - 36.0 g/dL   RBC 5.23 3.70 - 5.45 10e6/uL   RDW 15.4 (H) 11.2 - 14.5 %   lymph# 2.1 0.9 - 3.3 10e3/uL   MONO# 1.1 (H) 0.1 - 0.9 10e3/uL   Eosinophils Absolute 0.3 0.0 - 0.5 10e3/uL   Basophils Absolute 0.1 0.0 - 0.1 10e3/uL   NEUT% 76.0 38.4 - 76.8 %   LYMPH% 14.0 14.0 - 49.7 %   MONO% 7.1 0.0 - 14.0 %   EOS% 2.0 0.0 - 7.0 %   BASO% 0.9 0.0 - 2.0 %    Lab Results  Component Value Date   WBC 14.9* 08/25/2014   HGB 15.0 08/25/2014   HCT 46.2 08/25/2014   MCV 88.2 08/25/2014   PLT 342 08/25/2014    RADIOGRAPHIC STUDIES: I reviewed the Ct scan with her I have personally reviewed the radiological images as listed and agreed with the findings in the report.   ASSESSMENT & PLAN:  Leukocytosis The is chronic reactive to smoking/COPD She is not symptomatic. Observe only   Lung nodule This is stable Continue periodic CT imaging   Mediastinal adenopathy Stable, likely reactive Recommend smoking cessation   Smoking I spent some time counseling the patient the importance of tobacco cessation. she is currently attempting to quit on her own  I gave her patient education handout and encouraged her to sign up for smoking cessation class.     All questions were answered. The patient knows to call the clinic with any problems, questions or concerns. No barriers to learning was detected.  I spent 25 minutes counseling the patient face to face. The total time spent in the appointment was 30 minutes and more than 50% was on counseling.     [facility], Avah Bashor, MD 5/14/20169:35 PM

## 2014-08-26 NOTE — Assessment & Plan Note (Signed)
I spent some time counseling the patient the importance of tobacco cessation. she is currently attempting to quit on her own  I gave her patient education handout and encouraged her to sign up for smoking cessation class.  

## 2014-08-26 NOTE — Assessment & Plan Note (Signed)
The is chronic reactive to smoking/COPD She is not symptomatic. Observe only

## 2014-08-26 NOTE — Assessment & Plan Note (Signed)
Stable, likely reactive Recommend smoking cessation

## 2014-08-26 NOTE — Assessment & Plan Note (Signed)
This is stable Continue periodic CT imaging

## 2015-01-16 ENCOUNTER — Other Ambulatory Visit: Payer: Self-pay | Admitting: Internal Medicine

## 2015-01-16 DIAGNOSIS — N6320 Unspecified lump in the left breast, unspecified quadrant: Secondary | ICD-10-CM

## 2015-01-26 ENCOUNTER — Other Ambulatory Visit: Payer: Self-pay | Admitting: Internal Medicine

## 2015-01-26 ENCOUNTER — Ambulatory Visit
Admission: RE | Admit: 2015-01-26 | Discharge: 2015-01-26 | Disposition: A | Discharge status: Home / Self Care | Payer: Medicare Other | Source: Ambulatory Visit | Attending: Internal Medicine | Admitting: Internal Medicine

## 2015-01-26 DIAGNOSIS — N6320 Unspecified lump in the left breast, unspecified quadrant: Secondary | ICD-10-CM

## 2015-01-31 ENCOUNTER — Encounter (HOSPITAL_COMMUNITY): Payer: Self-pay | Admitting: Emergency Medicine

## 2015-01-31 ENCOUNTER — Emergency Department (HOSPITAL_COMMUNITY): Payer: Medicare Other

## 2015-01-31 ENCOUNTER — Inpatient Hospital Stay (HOSPITAL_COMMUNITY): Payer: Medicare Other | Admitting: Anesthesiology

## 2015-01-31 ENCOUNTER — Inpatient Hospital Stay (HOSPITAL_COMMUNITY): Payer: Medicare Other

## 2015-01-31 ENCOUNTER — Encounter (HOSPITAL_COMMUNITY)
Admission: EM | Disposition: A | Discharge status: Home Health | Payer: Self-pay | Source: Home / Self Care | Attending: Pulmonary Disease

## 2015-01-31 ENCOUNTER — Inpatient Hospital Stay (HOSPITAL_COMMUNITY)
Admission: EM | Admit: 2015-01-31 | Discharge: 2015-02-16 | DRG: 853 | Disposition: A | Discharge status: Home Health | Payer: Medicare Other | Attending: Internal Medicine | Admitting: Internal Medicine

## 2015-01-31 DIAGNOSIS — N739 Female pelvic inflammatory disease, unspecified: Secondary | ICD-10-CM | POA: Diagnosis present

## 2015-01-31 DIAGNOSIS — E11649 Type 2 diabetes mellitus with hypoglycemia without coma: Secondary | ICD-10-CM | POA: Diagnosis present

## 2015-01-31 DIAGNOSIS — R079 Chest pain, unspecified: Secondary | ICD-10-CM | POA: Diagnosis not present

## 2015-01-31 DIAGNOSIS — J449 Chronic obstructive pulmonary disease, unspecified: Secondary | ICD-10-CM | POA: Diagnosis present

## 2015-01-31 DIAGNOSIS — D72829 Elevated white blood cell count, unspecified: Secondary | ICD-10-CM

## 2015-01-31 DIAGNOSIS — I4891 Unspecified atrial fibrillation: Secondary | ICD-10-CM | POA: Diagnosis present

## 2015-01-31 DIAGNOSIS — K3532 Acute appendicitis with perforation and localized peritonitis, without abscess: Secondary | ICD-10-CM | POA: Diagnosis present

## 2015-01-31 DIAGNOSIS — K660 Peritoneal adhesions (postprocedural) (postinfection): Secondary | ICD-10-CM | POA: Diagnosis present

## 2015-01-31 DIAGNOSIS — Z880 Allergy status to penicillin: Secondary | ICD-10-CM | POA: Diagnosis not present

## 2015-01-31 DIAGNOSIS — K381 Appendicular concretions: Secondary | ICD-10-CM | POA: Diagnosis present

## 2015-01-31 DIAGNOSIS — J432 Centrilobular emphysema: Secondary | ICD-10-CM | POA: Diagnosis not present

## 2015-01-31 DIAGNOSIS — K352 Acute appendicitis with generalized peritonitis: Secondary | ICD-10-CM

## 2015-01-31 DIAGNOSIS — E785 Hyperlipidemia, unspecified: Secondary | ICD-10-CM | POA: Diagnosis present

## 2015-01-31 DIAGNOSIS — E876 Hypokalemia: Secondary | ICD-10-CM

## 2015-01-31 DIAGNOSIS — F17200 Nicotine dependence, unspecified, uncomplicated: Secondary | ICD-10-CM | POA: Diagnosis present

## 2015-01-31 DIAGNOSIS — A419 Sepsis, unspecified organism: Principal | ICD-10-CM | POA: Diagnosis present

## 2015-01-31 DIAGNOSIS — E8770 Fluid overload, unspecified: Secondary | ICD-10-CM | POA: Diagnosis not present

## 2015-01-31 DIAGNOSIS — R112 Nausea with vomiting, unspecified: Secondary | ICD-10-CM | POA: Diagnosis present

## 2015-01-31 DIAGNOSIS — K353 Acute appendicitis with localized peritonitis: Secondary | ICD-10-CM | POA: Diagnosis present

## 2015-01-31 DIAGNOSIS — J810 Acute pulmonary edema: Secondary | ICD-10-CM | POA: Diagnosis not present

## 2015-01-31 DIAGNOSIS — J9601 Acute respiratory failure with hypoxia: Secondary | ICD-10-CM | POA: Diagnosis not present

## 2015-01-31 DIAGNOSIS — R0602 Shortness of breath: Secondary | ICD-10-CM

## 2015-01-31 DIAGNOSIS — R6521 Severe sepsis with septic shock: Secondary | ICD-10-CM | POA: Diagnosis present

## 2015-01-31 DIAGNOSIS — Z79899 Other long term (current) drug therapy: Secondary | ICD-10-CM | POA: Diagnosis not present

## 2015-01-31 DIAGNOSIS — R000 Tachycardia, unspecified: Secondary | ICD-10-CM

## 2015-01-31 DIAGNOSIS — I472 Ventricular tachycardia, unspecified: Secondary | ICD-10-CM | POA: Insufficient documentation

## 2015-01-31 DIAGNOSIS — I5031 Acute diastolic (congestive) heart failure: Secondary | ICD-10-CM

## 2015-01-31 DIAGNOSIS — E1165 Type 2 diabetes mellitus with hyperglycemia: Secondary | ICD-10-CM | POA: Diagnosis present

## 2015-01-31 DIAGNOSIS — J9600 Acute respiratory failure, unspecified whether with hypoxia or hypercapnia: Secondary | ICD-10-CM

## 2015-01-31 DIAGNOSIS — R931 Abnormal findings on diagnostic imaging of heart and coronary circulation: Secondary | ICD-10-CM | POA: Diagnosis not present

## 2015-01-31 DIAGNOSIS — I9789 Other postprocedural complications and disorders of the circulatory system, not elsewhere classified: Secondary | ICD-10-CM | POA: Diagnosis present

## 2015-01-31 DIAGNOSIS — I313 Pericardial effusion (noninflammatory): Secondary | ICD-10-CM | POA: Diagnosis present

## 2015-01-31 DIAGNOSIS — I2510 Atherosclerotic heart disease of native coronary artery without angina pectoris: Secondary | ICD-10-CM | POA: Diagnosis not present

## 2015-01-31 DIAGNOSIS — Z7982 Long term (current) use of aspirin: Secondary | ICD-10-CM | POA: Diagnosis not present

## 2015-01-31 DIAGNOSIS — F419 Anxiety disorder, unspecified: Secondary | ICD-10-CM | POA: Diagnosis present

## 2015-01-31 DIAGNOSIS — F319 Bipolar disorder, unspecified: Secondary | ICD-10-CM | POA: Diagnosis present

## 2015-01-31 DIAGNOSIS — E162 Hypoglycemia, unspecified: Secondary | ICD-10-CM | POA: Diagnosis not present

## 2015-01-31 DIAGNOSIS — I480 Paroxysmal atrial fibrillation: Secondary | ICD-10-CM | POA: Diagnosis not present

## 2015-01-31 DIAGNOSIS — K567 Ileus, unspecified: Secondary | ICD-10-CM | POA: Diagnosis present

## 2015-01-31 DIAGNOSIS — K8020 Calculus of gallbladder without cholecystitis without obstruction: Secondary | ICD-10-CM | POA: Diagnosis present

## 2015-01-31 DIAGNOSIS — R9439 Abnormal result of other cardiovascular function study: Secondary | ICD-10-CM | POA: Diagnosis not present

## 2015-01-31 DIAGNOSIS — R0902 Hypoxemia: Secondary | ICD-10-CM

## 2015-01-31 DIAGNOSIS — Z978 Presence of other specified devices: Secondary | ICD-10-CM

## 2015-01-31 DIAGNOSIS — Z720 Tobacco use: Secondary | ICD-10-CM | POA: Diagnosis not present

## 2015-01-31 HISTORY — PX: LAPAROSCOPIC APPENDECTOMY: SHX408

## 2015-01-31 LAB — BLOOD GAS, ARTERIAL
Acid-base deficit: 0.8 mmol/L (ref 0.0–2.0)
Bicarbonate: 25 mEq/L — ABNORMAL HIGH (ref 20.0–24.0)
DRAWN BY: 345601
FIO2: 0.5
LHR: 15 {breaths}/min
MECHVT: 500 mL
O2 Saturation: 93 %
PATIENT TEMPERATURE: 97.6
PEEP: 5 cmH2O
PO2 ART: 74.1 mmHg — AB (ref 80.0–100.0)
TCO2: 23.3 mmol/L (ref 0–100)
pCO2 arterial: 47.9 mmHg — ABNORMAL HIGH (ref 35.0–45.0)
pH, Arterial: 7.334 — ABNORMAL LOW (ref 7.350–7.450)

## 2015-01-31 LAB — CBC WITH DIFFERENTIAL/PLATELET
Basophils Absolute: 0 10*3/uL (ref 0.0–0.1)
Basophils Relative: 0 %
EOS PCT: 0 %
Eosinophils Absolute: 0 10*3/uL (ref 0.0–0.7)
HEMATOCRIT: 44.9 % (ref 36.0–46.0)
Hemoglobin: 15.5 g/dL — ABNORMAL HIGH (ref 12.0–15.0)
LYMPHS ABS: 0.7 10*3/uL (ref 0.7–4.0)
Lymphocytes Relative: 4 %
MCH: 29.4 pg (ref 26.0–34.0)
MCHC: 34.5 g/dL (ref 30.0–36.0)
MCV: 85.2 fL (ref 78.0–100.0)
MONOS PCT: 9 %
Monocytes Absolute: 1.7 10*3/uL — ABNORMAL HIGH (ref 0.1–1.0)
Neutro Abs: 16 10*3/uL — ABNORMAL HIGH (ref 1.7–7.7)
Neutrophils Relative %: 87 %
PLATELETS: 254 10*3/uL (ref 150–400)
RBC: 5.27 MIL/uL — ABNORMAL HIGH (ref 3.87–5.11)
RDW: 14.2 % (ref 11.5–15.5)
WBC: 18.4 10*3/uL — ABNORMAL HIGH (ref 4.0–10.5)

## 2015-01-31 LAB — COMPREHENSIVE METABOLIC PANEL
ALT: 18 U/L (ref 14–54)
AST: 28 U/L (ref 15–41)
Albumin: 2.8 g/dL — ABNORMAL LOW (ref 3.5–5.0)
Alkaline Phosphatase: 81 U/L (ref 38–126)
Anion gap: 12 (ref 5–15)
BUN: 62 mg/dL — ABNORMAL HIGH (ref 6–20)
CHLORIDE: 100 mmol/L — AB (ref 101–111)
CO2: 27 mmol/L (ref 22–32)
Calcium: 9.2 mg/dL (ref 8.9–10.3)
Creatinine, Ser: 1.04 mg/dL — ABNORMAL HIGH (ref 0.44–1.00)
GFR calc non Af Amer: 54 mL/min — ABNORMAL LOW (ref >60)
Glucose, Bld: 166 mg/dL — ABNORMAL HIGH (ref 65–99)
POTASSIUM: 3.2 mmol/L — AB (ref 3.5–5.1)
SODIUM: 139 mmol/L (ref 135–145)
Total Bilirubin: 1 mg/dL (ref 0.3–1.2)
Total Protein: 7.5 g/dL (ref 6.5–8.1)

## 2015-01-31 LAB — URINALYSIS, ROUTINE W REFLEX MICROSCOPIC
Glucose, UA: NEGATIVE mg/dL
HGB URINE DIPSTICK: NEGATIVE
Ketones, ur: NEGATIVE mg/dL
Leukocytes, UA: NEGATIVE
NITRITE: NEGATIVE
PROTEIN: NEGATIVE mg/dL
Specific Gravity, Urine: 1.026 (ref 1.005–1.030)
Urobilinogen, UA: 1 mg/dL (ref 0.0–1.0)
pH: 5.5 (ref 5.0–8.0)

## 2015-01-31 LAB — GLUCOSE, CAPILLARY
GLUCOSE-CAPILLARY: 13 mg/dL — AB (ref 65–99)
GLUCOSE-CAPILLARY: 65 mg/dL (ref 65–99)
Glucose-Capillary: 84 mg/dL (ref 65–99)
Glucose-Capillary: 58 mg/dL — ABNORMAL LOW (ref 65–99)
Glucose-Capillary: 113 mg/dL — ABNORMAL HIGH (ref 65–99)

## 2015-01-31 LAB — BASIC METABOLIC PANEL
Anion gap: 14 (ref 5–15)
BUN: 64 mg/dL — AB (ref 6–20)
CHLORIDE: 100 mmol/L — AB (ref 101–111)
CO2: 25 mmol/L (ref 22–32)
CREATININE: 0.87 mg/dL (ref 0.44–1.00)
Calcium: 9.6 mg/dL (ref 8.9–10.3)
GFR calc Af Amer: >60 mL/min (ref >60)
Glucose, Bld: 43 mg/dL — CL (ref 65–99)
Potassium: 3.8 mmol/L (ref 3.5–5.1)
SODIUM: 139 mmol/L (ref 135–145)

## 2015-01-31 LAB — I-STAT CG4 LACTIC ACID, ED
Lactic Acid, Venous: 1.98 mmol/L (ref 0.5–2.0)
Lactic Acid, Venous: 1.91 mmol/L (ref 0.5–2.0)

## 2015-01-31 LAB — LIPASE, BLOOD: Lipase: 11 U/L — ABNORMAL LOW (ref 22–51)

## 2015-01-31 LAB — CBG MONITORING, ED: Glucose-Capillary: 128 mg/dL — ABNORMAL HIGH (ref 65–99)

## 2015-01-31 LAB — PROCALCITONIN: Procalcitonin: 29.7 ng/mL

## 2015-01-31 SURGERY — APPENDECTOMY, LAPAROSCOPIC
Anesthesia: General | Site: Abdomen

## 2015-01-31 MED ORDER — DEXTROSE 50 % IV SOLN
INTRAVENOUS | Status: AC
  Administered 2015-01-31: 50 mL
  Filled 2015-01-31: qty 50

## 2015-01-31 MED ORDER — DEXTROSE-NACL 5-0.9 % IV SOLN
INTRAVENOUS | Status: DC
  Administered 2015-01-31 – 2015-02-01 (×2): via INTRAVENOUS

## 2015-01-31 MED ORDER — CIPROFLOXACIN IN D5W 400 MG/200ML IV SOLN: 400.0000 mg | Freq: Two times a day (BID) | INTRAVENOUS | Status: DC

## 2015-01-31 MED ORDER — INSULIN ASPART 100 UNIT/ML ~~LOC~~ SOLN: 0.0000 [IU] | SUBCUTANEOUS | Status: DC

## 2015-01-31 MED ORDER — HEPARIN SODIUM (PORCINE) 5000 UNIT/ML IJ SOLN
5000.0000 [IU] | Freq: Three times a day (TID) | INTRAMUSCULAR | Status: DC
  Filled 2015-01-31 (×2): qty 1

## 2015-01-31 MED ORDER — ONDANSETRON HCL 4 MG/2ML IJ SOLN
INTRAMUSCULAR | Status: DC | PRN
  Administered 2015-01-31: 4 mg via INTRAVENOUS

## 2015-01-31 MED ORDER — LORAZEPAM 2 MG/ML IJ SOLN
0.5000 mg | INTRAMUSCULAR | Status: DC | PRN
  Administered 2015-01-31 – 2015-02-04 (×3): 1 mg via INTRAVENOUS
  Filled 2015-01-31 (×3): qty 1

## 2015-01-31 MED ORDER — HEPARIN SODIUM (PORCINE) 5000 UNIT/ML IJ SOLN
5000.0000 [IU] | Freq: Three times a day (TID) | INTRAMUSCULAR | Status: DC
  Administered 2015-02-01: 5000 [IU] via SUBCUTANEOUS
  Filled 2015-01-31: qty 1

## 2015-01-31 MED ORDER — LACTATED RINGERS IR SOLN
Status: DC | PRN
  Administered 2015-01-31: 3000 mL

## 2015-01-31 MED ORDER — SODIUM CHLORIDE 0.9 % IV SOLN: 250.0000 mL | INTRAVENOUS | Status: DC | PRN

## 2015-01-31 MED ORDER — PANTOPRAZOLE SODIUM 40 MG IV SOLR
40.0000 mg | Freq: Every day | INTRAVENOUS | Status: DC
  Administered 2015-01-31: 40 mg via INTRAVENOUS
  Filled 2015-01-31: qty 40

## 2015-01-31 MED ORDER — METRONIDAZOLE IN NACL 5-0.79 MG/ML-% IV SOLN: 500.0000 mg | Freq: Three times a day (TID) | INTRAVENOUS | Status: DC

## 2015-01-31 MED ORDER — FLUCONAZOLE IN SODIUM CHLORIDE 400-0.9 MG/200ML-% IV SOLN
800.0000 mg | Freq: Once | INTRAVENOUS | Status: AC
  Administered 2015-01-31: 800 mg via INTRAVENOUS
  Filled 2015-01-31: qty 400

## 2015-01-31 MED ORDER — BUPIVACAINE-EPINEPHRINE 0.25% -1:200000 IJ SOLN
INTRAMUSCULAR | Status: AC
  Filled 2015-01-31: qty 1

## 2015-01-31 MED ORDER — POTASSIUM CHLORIDE IN NACL 20-0.45 MEQ/L-% IV SOLN
INTRAVENOUS | Status: DC
  Filled 2015-01-31 (×2): qty 1000

## 2015-01-31 MED ORDER — POTASSIUM CHLORIDE 10 MEQ/100ML IV SOLN: 10.0000 meq | INTRAVENOUS | Status: AC

## 2015-01-31 MED ORDER — ONDANSETRON HCL 4 MG/2ML IJ SOLN: 4.0000 mg | Freq: Four times a day (QID) | INTRAMUSCULAR | Status: DC | PRN

## 2015-01-31 MED ORDER — LACTATED RINGERS IV SOLN
INTRAVENOUS | Status: DC | PRN
  Administered 2015-01-31 (×2): via INTRAVENOUS

## 2015-01-31 MED ORDER — VANCOMYCIN HCL 10 G IV SOLR
1250.0000 mg | INTRAVENOUS | Status: DC
  Administered 2015-02-01: 1250 mg via INTRAVENOUS
  Filled 2015-01-31 (×2): qty 1250

## 2015-01-31 MED ORDER — LACTATED RINGERS IR SOLN
Status: DC | PRN
  Administered 2015-01-31: 1000 mL

## 2015-01-31 MED ORDER — MORPHINE SULFATE (PF) 2 MG/ML IV SOLN
1.0000 mg | INTRAVENOUS | Status: DC | PRN
  Administered 2015-02-05: 2 mg via INTRAVENOUS
  Filled 2015-01-31: qty 1

## 2015-01-31 MED ORDER — SODIUM CHLORIDE 0.9 % IV SOLN
INTRAVENOUS | Status: DC
  Administered 2015-01-31: 21:00:00 via INTRAVENOUS

## 2015-01-31 MED ORDER — HYDROCODONE-ACETAMINOPHEN 5-325 MG PO TABS: 1.0000 | ORAL_TABLET | ORAL | Status: DC | PRN

## 2015-01-31 MED ORDER — FENTANYL CITRATE (PF) 100 MCG/2ML IJ SOLN
INTRAMUSCULAR | Status: AC
  Filled 2015-01-31: qty 4

## 2015-01-31 MED ORDER — VANCOMYCIN HCL 10 G IV SOLR
1250.0000 mg | Freq: Once | INTRAVENOUS | Status: AC
  Administered 2015-01-31: 1250 mg via INTRAVENOUS
  Filled 2015-01-31: qty 1250

## 2015-01-31 MED ORDER — IPRATROPIUM-ALBUTEROL 0.5-2.5 (3) MG/3ML IN SOLN
3.0000 mL | Freq: Once | RESPIRATORY_TRACT | Status: AC
  Administered 2015-01-31: 3 mL via RESPIRATORY_TRACT
  Filled 2015-01-31: qty 3

## 2015-01-31 MED ORDER — ROCURONIUM BROMIDE 100 MG/10ML IV SOLN
INTRAVENOUS | Status: AC
  Filled 2015-01-31: qty 1

## 2015-01-31 MED ORDER — PHENYLEPHRINE 40 MCG/ML (10ML) SYRINGE FOR IV PUSH (FOR BLOOD PRESSURE SUPPORT)
PREFILLED_SYRINGE | INTRAVENOUS | Status: AC
  Filled 2015-01-31: qty 10

## 2015-01-31 MED ORDER — ONDANSETRON 4 MG PO TBDP
4.0000 mg | ORAL_TABLET | Freq: Four times a day (QID) | ORAL | Status: DC | PRN
  Filled 2015-01-31: qty 1

## 2015-01-31 MED ORDER — PHENYLEPHRINE HCL 10 MG/ML IJ SOLN
INTRAMUSCULAR | Status: AC
  Filled 2015-01-31: qty 2

## 2015-01-31 MED ORDER — PROPOFOL 10 MG/ML IV BOLUS
INTRAVENOUS | Status: AC
  Filled 2015-01-31: qty 20

## 2015-01-31 MED ORDER — ALBUTEROL SULFATE (2.5 MG/3ML) 0.083% IN NEBU
2.5000 mg | INHALATION_SOLUTION | RESPIRATORY_TRACT | Status: DC | PRN
  Filled 2015-01-31: qty 3

## 2015-01-31 MED ORDER — PROPOFOL 10 MG/ML IV BOLUS
INTRAVENOUS | Status: DC | PRN
  Administered 2015-01-31: 100 mg via INTRAVENOUS

## 2015-01-31 MED ORDER — CIPROFLOXACIN IN D5W 400 MG/200ML IV SOLN
INTRAVENOUS | Status: AC
  Filled 2015-01-31: qty 200

## 2015-01-31 MED ORDER — LACTATED RINGERS IV SOLN
INTRAVENOUS | Status: DC | PRN
  Administered 2015-01-31 (×2): via INTRAVENOUS

## 2015-01-31 MED ORDER — FENTANYL CITRATE (PF) 250 MCG/5ML IJ SOLN
INTRAMUSCULAR | Status: DC | PRN
  Administered 2015-01-31 (×2): 50 ug via INTRAVENOUS

## 2015-01-31 MED ORDER — SODIUM CHLORIDE 0.9 % IV SOLN
500.0000 mg | Freq: Three times a day (TID) | INTRAVENOUS | Status: DC
  Administered 2015-01-31 – 2015-02-01 (×2): 500 mg via INTRAVENOUS
  Filled 2015-01-31 (×3): qty 500

## 2015-01-31 MED ORDER — FENTANYL CITRATE (PF) 100 MCG/2ML IJ SOLN
25.0000 ug | INTRAMUSCULAR | Status: DC | PRN
  Administered 2015-01-31: 50 ug via INTRAVENOUS
  Administered 2015-01-31: 100 ug via INTRAVENOUS
  Administered 2015-01-31: 50 ug via INTRAVENOUS
  Administered 2015-02-01: 100 ug via INTRAVENOUS
  Administered 2015-02-01 (×2): 50 ug via INTRAVENOUS
  Filled 2015-01-31 (×6): qty 2

## 2015-01-31 MED ORDER — ALBUMIN HUMAN 25 % IV SOLN
INTRAVENOUS | Status: DC | PRN
  Administered 2015-01-31 (×2): via INTRAVENOUS

## 2015-01-31 MED ORDER — PROPOFOL 1000 MG/100ML IV EMUL
5.0000 ug/kg/min | INTRAVENOUS | Status: DC
  Administered 2015-02-01: 5 ug/kg/min via INTRAVENOUS
  Administered 2015-02-01 (×2): 20 ug/kg/min via INTRAVENOUS
  Administered 2015-02-01: 25 ug/kg/min via INTRAVENOUS
  Administered 2015-02-01: 20 ug/kg/min via INTRAVENOUS
  Administered 2015-02-02: 22 ug/kg/min via INTRAVENOUS
  Filled 2015-01-31 (×4): qty 100

## 2015-01-31 MED ORDER — METRONIDAZOLE IN NACL 5-0.79 MG/ML-% IV SOLN
500.0000 mg | Freq: Once | INTRAVENOUS | Status: AC
  Administered 2015-01-31: 500 mg via INTRAVENOUS
  Filled 2015-01-31: qty 100

## 2015-01-31 MED ORDER — PHENYLEPHRINE HCL 10 MG/ML IJ SOLN
INTRAMUSCULAR | Status: DC | PRN
  Administered 2015-01-31 (×5): 80 ug via INTRAVENOUS

## 2015-01-31 MED ORDER — ALBUMIN HUMAN 5 % IV SOLN
INTRAVENOUS | Status: AC
Start: 2015-01-31 — End: 2015-01-31
  Filled 2015-01-31: qty 500

## 2015-01-31 MED ORDER — MIDAZOLAM HCL 2 MG/2ML IJ SOLN
INTRAMUSCULAR | Status: AC
  Filled 2015-01-31: qty 2

## 2015-01-31 MED ORDER — SUCCINYLCHOLINE CHLORIDE 20 MG/ML IJ SOLN
INTRAMUSCULAR | Status: DC | PRN
  Administered 2015-01-31: 100 mg via INTRAVENOUS

## 2015-01-31 MED ORDER — BUPIVACAINE HCL (PF) 0.25 % IJ SOLN
INTRAMUSCULAR | Status: AC
  Filled 2015-01-31: qty 30

## 2015-01-31 MED ORDER — PHENYLEPHRINE HCL 10 MG/ML IJ SOLN
10.0000 mg | INTRAVENOUS | Status: DC | PRN
  Administered 2015-01-31: 50 ug/min via INTRAVENOUS

## 2015-01-31 MED ORDER — FENTANYL CITRATE (PF) 250 MCG/5ML IJ SOLN
INTRAMUSCULAR | Status: AC
  Filled 2015-01-31: qty 5

## 2015-01-31 MED ORDER — SODIUM CHLORIDE 0.9 % IV SOLN: INTRAVENOUS | Status: DC

## 2015-01-31 MED ORDER — ONDANSETRON HCL 4 MG/2ML IJ SOLN
INTRAMUSCULAR | Status: AC
  Filled 2015-01-31: qty 2

## 2015-01-31 MED ORDER — DEXTROSE 5 % IV SOLN
1.0000 g | Freq: Once | INTRAVENOUS | Status: AC
  Administered 2015-01-31: 1 g via INTRAVENOUS
  Filled 2015-01-31: qty 10

## 2015-01-31 MED ORDER — MORPHINE SULFATE (PF) 4 MG/ML IV SOLN
4.0000 mg | Freq: Once | INTRAVENOUS | Status: AC
  Administered 2015-01-31: 4 mg via INTRAVENOUS
  Filled 2015-01-31: qty 1

## 2015-01-31 MED ORDER — DEXTROSE 50 % IV SOLN
1.0000 | Freq: Once | INTRAVENOUS | Status: AC
  Administered 2015-01-31: 50 mL via INTRAVENOUS
  Filled 2015-01-31: qty 50

## 2015-01-31 MED ORDER — SODIUM CHLORIDE 0.9 % IV BOLUS (SEPSIS)
1000.0000 mL | Freq: Once | INTRAVENOUS | Status: AC
  Administered 2015-01-31: 1000 mL via INTRAVENOUS

## 2015-01-31 MED ORDER — BUPIVACAINE-EPINEPHRINE 0.25% -1:200000 IJ SOLN
INTRAMUSCULAR | Status: DC | PRN
  Administered 2015-01-31: 30 mL

## 2015-01-31 MED ORDER — FLUCONAZOLE IN SODIUM CHLORIDE 400-0.9 MG/200ML-% IV SOLN
400.0000 mg | INTRAVENOUS | Status: DC
Start: 2015-02-01 — End: 2015-02-04
  Administered 2015-02-01 – 2015-02-03 (×3): 400 mg via INTRAVENOUS
  Filled 2015-01-31 (×4): qty 200

## 2015-01-31 MED ORDER — ROCURONIUM BROMIDE 100 MG/10ML IV SOLN
INTRAVENOUS | Status: DC | PRN
  Administered 2015-01-31 (×2): 10 mg via INTRAVENOUS
  Administered 2015-01-31: 20 mg via INTRAVENOUS

## 2015-01-31 MED ORDER — 0.9 % SODIUM CHLORIDE (POUR BTL) OPTIME
TOPICAL | Status: DC | PRN
  Administered 2015-01-31: 1000 mL

## 2015-01-31 MED ORDER — CIPROFLOXACIN IN D5W 400 MG/200ML IV SOLN
400.0000 mg | Freq: Two times a day (BID) | INTRAVENOUS | Status: DC
  Administered 2015-01-31: 400 mg via INTRAVENOUS

## 2015-01-31 MED ORDER — METRONIDAZOLE IN NACL 5-0.79 MG/ML-% IV SOLN
500.0000 mg | Freq: Four times a day (QID) | INTRAVENOUS | Status: DC
Start: 2015-01-31 — End: 2015-01-31

## 2015-01-31 SURGICAL SUPPLY — 53 items
APPLIER CLIP ROT 10 11.4 M/L (STAPLE)
BENZOIN TINCTURE PRP APPL 2/3 (GAUZE/BANDAGES/DRESSINGS) IMPLANT
CABLE HIGH FREQUENCY MONO STRZ (ELECTRODE) ×3 IMPLANT
CHLORAPREP W/TINT 26ML (MISCELLANEOUS) ×3 IMPLANT
CLIP APPLIE ROT 10 11.4 M/L (STAPLE) IMPLANT
CLOSURE WOUND 1/2 X4 (GAUZE/BANDAGES/DRESSINGS)
COVER SURGICAL LIGHT HANDLE (MISCELLANEOUS) ×3 IMPLANT
CUTTER FLEX LINEAR 45M (STAPLE) ×3 IMPLANT
DECANTER SPIKE VIAL GLASS SM (MISCELLANEOUS) ×3 IMPLANT
DRAIN CHANNEL 19F RND (DRAIN) ×3 IMPLANT
DRAPE LAPAROSCOPIC ABDOMINAL (DRAPES) ×3 IMPLANT
ELECT REM PT RETURN 9FT ADLT (ELECTROSURGICAL) ×3
ELECTRODE REM PT RTRN 9FT ADLT (ELECTROSURGICAL) ×1 IMPLANT
ENDOLOOP SUT PDS II  0 18 (SUTURE)
ENDOLOOP SUT PDS II 0 18 (SUTURE) IMPLANT
EVACUATOR DRAINAGE 10X20 100CC (DRAIN) ×1 IMPLANT
EVACUATOR SILICONE 100CC (DRAIN) ×2
GLOVE SURG SIGNA 7.5 PF LTX (GLOVE) ×3 IMPLANT
GLOVE SURG SS PI 6.5 STRL IVOR (GLOVE) ×3 IMPLANT
GLOVE SURG SS PI 8.5 STRL IVOR (GLOVE) ×4
GLOVE SURG SS PI 8.5 STRL STRW (GLOVE) ×2 IMPLANT
GOWN STRL REUS W/TWL XL LVL3 (GOWN DISPOSABLE) ×6 IMPLANT
HOLDER FOLEY CATH W/STRAP (MISCELLANEOUS) ×3 IMPLANT
HOVERMATT SINGLE USE (MISCELLANEOUS) ×3 IMPLANT
IV LACTATED RINGER IRRG 3000ML (IV SOLUTION) ×2
IV LACTATED RINGERS 1000ML (IV SOLUTION) ×3 IMPLANT
IV LR IRRIG 3000ML ARTHROMATIC (IV SOLUTION) ×1 IMPLANT
KIT BASIN OR (CUSTOM PROCEDURE TRAY) ×3 IMPLANT
LIQUID BAND (GAUZE/BANDAGES/DRESSINGS) IMPLANT
NS IRRIG 1000ML POUR BTL (IV SOLUTION) ×3 IMPLANT
POUCH SPECIMEN RETRIEVAL 10MM (ENDOMECHANICALS) ×3 IMPLANT
RELOAD 45 VASCULAR/THIN (ENDOMECHANICALS) IMPLANT
RELOAD STAPLE TA45 3.5 REG BLU (ENDOMECHANICALS) ×3 IMPLANT
SCISSORS LAP 5X35 DISP (ENDOMECHANICALS) ×3 IMPLANT
SET IRRIG TUBING LAPAROSCOPIC (IRRIGATION / IRRIGATOR) ×3 IMPLANT
SHEARS HARMONIC ACE PLUS 36CM (ENDOMECHANICALS) ×3 IMPLANT
SLEEVE ADV FIXATION 5X100MM (TROCAR) ×3 IMPLANT
SLEEVE XCEL OPT CAN 5 100 (ENDOMECHANICALS) ×3 IMPLANT
SPONGE DRAIN TRACH 4X4 STRL 2S (GAUZE/BANDAGES/DRESSINGS) ×3 IMPLANT
STRIP CLOSURE SKIN 1/2X4 (GAUZE/BANDAGES/DRESSINGS) IMPLANT
SUT ETHILON 2 0 PS N (SUTURE) ×3 IMPLANT
SUT MNCRL AB 4-0 PS2 18 (SUTURE) ×6 IMPLANT
SUT VIC AB 2-0 SH 18 (SUTURE) IMPLANT
SUT VICRYL 0 UR6 27IN ABS (SUTURE) ×6 IMPLANT
TAPE CLOTH 4X10 WHT NS (GAUZE/BANDAGES/DRESSINGS) ×3 IMPLANT
TAPE CLOTH SURG 4X10 WHT LF (GAUZE/BANDAGES/DRESSINGS) ×3 IMPLANT
TOWEL OR 17X26 10 PK STRL BLUE (TOWEL DISPOSABLE) ×3 IMPLANT
TOWEL OR NON WOVEN STRL DISP B (DISPOSABLE) ×3 IMPLANT
TRAY FOLEY W/METER SILVER 14FR (SET/KITS/TRAYS/PACK) ×3 IMPLANT
TRAY LAPAROSCOPIC (CUSTOM PROCEDURE TRAY) ×3 IMPLANT
TROCAR ADV FIXATION 5X100MM (TROCAR) ×3 IMPLANT
TROCAR BLADELESS OPT 5 100 (ENDOMECHANICALS) ×3 IMPLANT
TROCAR XCEL BLUNT TIP 100MML (ENDOMECHANICALS) ×3 IMPLANT

## 2015-01-31 NOTE — ED Notes (Signed)
Bed: PV66 Expected date:  Expected time:  Means of arrival:  Comments: 69 yr old flu symptoms

## 2015-01-31 NOTE — Transfer of Care (Signed)
Immediate Anesthesia Transfer of Care Note  Patient: Mercedes Thornton  Procedure(s) Performed: Procedure(s): LYSIS OF ADHESIONS /APPENDECTOMY LAPAROSCOPIC  (N/A)  Patient Location: ICU  Anesthesia Type:General  Level of Consciousness: sedated and patient cooperative  Airway & Oxygen Therapy: Patient remains intubated per anesthesia plan  Post-op Assessment: Report given to RN and Post -op Vital signs reviewed and stable  Post vital signs: Reviewed and stable  Last Vitals:  Filed Vitals:   01/31/15 1500  BP: 121/69  Pulse: 95  Temp:   Resp: 33    Complications: No apparent anesthesia complications

## 2015-01-31 NOTE — Anesthesia Postprocedure Evaluation (Signed)
  Anesthesia Post-op Note  Patient: Mercedes Thornton  Procedure(s) Performed: Procedure(s): LYSIS OF ADHESIONS /APPENDECTOMY LAPAROSCOPIC  (N/A) Patient remains intubated Vital signs are stable and clinically acceptable. Oxygen saturation is clinically acceptable. There are no apparent anesthetic complications at this time. Patient is ready for transport to ICU for Ventilator Mgt

## 2015-01-31 NOTE — Progress Notes (Signed)
ANTIBIOTIC CONSULT NOTE - INITIAL  Pharmacy Consult for ciprofloxacin/metronidazole to imipenem/vancomycin/fluconazole Indication: Rupture appendix with pelvic abscess  Allergies  Allergen Reactions  . Penicillins Hives and Swelling    Has patient had a PCN reaction causing immediate rash, facial/tongue/throat swelling, SOB or lightheadedness with hypotension: Yes Has patient had a PCN reaction causing severe rash involving mucus membranes or skin necrosis: Yes Has patient had a PCN reaction that required hospitalization Yes- hospital for 7 days Has patient had a PCN reaction occurring within the last 10 years: No If all of the above answers are "NO", then may proceed with Cephalosporin use.     Patient Measurements: Weight: 172 lb (78.019 kg) Adjusted Body Weight:   Vital Signs: Temp: 97.6 F (36.4 C) (10/19 2009) Temp Source: Axillary (10/19 2009) BP: 121/69 mmHg (10/19 1500) Pulse Rate: 95 (10/19 1500) Intake/Output from previous day:   Intake/Output from this shift: Total I/O In: 1200 [I.V.:1200] Out: -   Labs:  Recent Labs  01/31/15 1010 01/31/15 1136  WBC 18.4*  --   HGB 15.5*  --   PLT 254  --   CREATININE 0.87 1.04*   Estimated Creatinine Clearance: 52.3 mL/min (by C-G formula based on Cr of 1.04). No results for input(s): VANCOTROUGH, VANCOPEAK, VANCORANDOM, GENTTROUGH, GENTPEAK, GENTRANDOM, TOBRATROUGH, TOBRAPEAK, TOBRARND, AMIKACINPEAK, AMIKACINTROU, AMIKACIN in the last 72 hours.   Microbiology: Recent Results (from the past 720 hour(s))  Blood Culture (routine x 2)     Status: None (Preliminary result)   Collection Time: 01/31/15 11:50 AM  Result Value Ref Range Status   Specimen Description BLOOD LEFT ANTECUBITAL  Final   Special Requests   Final    BOTTLES DRAWN AEROBIC AND ANAEROBIC 5CC Performed at [facility]    Culture PENDING  Incomplete   Report Status PENDING  Incomplete  Blood Culture (routine x 2)     Status: None (Preliminary  result)   Collection Time: 01/31/15 12:02 PM  Result Value Ref Range Status   Specimen Description BLOOD LEFT HAND  Final   Special Requests   Final    IN PEDIATRIC BOTTLE 2CC Performed at [facility]    Culture PENDING  Incomplete   Report Status PENDING  Incomplete    Medical History: Past Medical History  Diagnosis Date  . Leukocytosis   . COPD (chronic obstructive pulmonary disease) ([place])   . Allergic rhinitis   . DM (diabetes mellitus) ([place])   . Bipolar affective disorder ([place])   . Hyperlipidemia   . Smoking   . Cough 10/30/2011  . SOB (shortness of breath) 10/30/2011  . Mediastinal adenopathy 11/26/2011   Assessment: 39 YOF presents with abd pain and N/V.  Found to have ruptured appendix and developing abscess.  Pharmacy asked to dose ciprofloxacin (ceftriaxone stopped for PCN allergy) and metronidazole. Patient taken to OR (no note available at this time), several antibiotic changes ordered but spoke with PCCM for clarification.  Apparent stool contamination.    Ceftriaxone x1 in ED  10/19 >> cipro >> 10/19 10/19 >> metronidazole >> 10/19 10/19 >> imipenem >> 10/19 >> vancomycin >> 10/19 >> fluconazole >>  Renal: Scr slightly elevated WBC elevated  Goal of Therapy:  Vancomycin trough 15-20 mcg/ml Dose for indication and for patient-specific parameters  Plan:   Vancomycin 1250mg  IV q24h  Check trough at steady state and monitor renal function  Imipenem 500mg  IV q8h  Discussed with PCCM - cross-reactivity low (typically less than with C-sporins)  Fluconazole 800mg  IV x  1 then 400mg  IV q24h  Doreene Eland, PharmD, BCPS.   Pager: [phone] 01/31/2015,8:20 PM

## 2015-01-31 NOTE — ED Notes (Signed)
Pt unsuccessful with using bedpan for UA.

## 2015-01-31 NOTE — Anesthesia Procedure Notes (Signed)
Procedure Name: Intubation Date/Time: 01/31/2015 5:25 PM Performed by: Dione Booze Pre-anesthesia Checklist: Emergency Drugs available, Suction available, Patient being monitored and Patient identified Patient Re-evaluated:Patient Re-evaluated prior to inductionOxygen Delivery Method: Circle system utilized Preoxygenation: Pre-oxygenation with 100% oxygen Intubation Type: IV induction, Cricoid Pressure applied and Rapid sequence Laryngoscope Size: Mac and 4 Grade View: Grade II Tube type: Subglottic suction tube Tube size: 7.5 mm Number of attempts: 1 Placement Confirmation: ETT inserted through vocal cords under direct vision and positive ETCO2 Secured at: 20 cm Tube secured with: Tape Dental Injury: Teeth and Oropharynx as per pre-operative assessment

## 2015-01-31 NOTE — H&P (Signed)
PULMONARY / CRITICAL CARE MEDICINE   Name: Mercedes Thornton MRN: [id number] DOB: Nov 27, 1945    ADMISSION DATE:  01/31/2015 CONSULTATION DATE:  01/31/15  REFERRING MD :  Dr. Tyrone Nine / EDP   CHIEF COMPLAINT:  Ruptured Appendix   INITIAL PRESENTATION: 69 y/o F, current smoker, with a PMH of COPD, DM, HLD, who presented to [facility] on 10/19 with nausea/vomiting, abdominal pain, chills and productive cough.  Found to have a ruptured appendix with developing pelvic abscess. PCCM consulted for admission.    STUDIES:  10/19 CT ABD / Pelvis >> findings most compatible with ruptured appendicitis with developing pelvic abscess, moderate small bowel dilation concerning for ileus rather than obstruction, cholelithiasis, unchanged pericardial effusion, distal esophageal thickening  SIGNIFICANT EVENTS: 10/19  Admit with ruptured appendix   HISTORY OF PRESENT ILLNESS: 69 y/o F, current smoker, with a PMH of COPD, DM, HLD, allergic rhinitis, mediastinal adenopathy, pericardial effusion (seen on CT in 08/2014), and bipolar disorder who presented to [facility] on 10/19 with nausea/vomiting, abdominal pain, chills and productive cough.    The patient reports she began having lower abdominal pain on Saturday October 10/15.  Symptoms progressed to nausea, vomiting and diarrhea that lasted all day.  She had some relief of abdominal pain on Sunday October 11th.  She had decreased energy, decreased oral intake and shaking chills alternating with drenching sweats throughout the weekend.    Initial ER evaluation noted BP of 88/55, sat 77%, afebrile and transiently tachycardic (122 highest).  WBC 18.4, Hgb 15.5, BUN 62, Sr Cr 0.87, glucose 43.  CT of the abdomen/pelvis was reviewed which demonstrated findings most compatible with ruptured appendicitis with developing pelvic abscess, moderate small bowel dilation concerning for ileus rather than obstruction, cholelithiasis, unchanged pericardial effusion, distal esophageal thickening.   She was treated with NS volume resuscitation, flagyl and rocephin in the ER.  Hypoglycemia was addressed as well.  The patients blood pressure responded well to IVF and returned to normal.  Lactic acid 1.98, albumin 2.8, AST 28/ALT 18.  PCCM consulted for evaluation / admission.    Sister at bedside reports she is a current 1ppd smoker for approximately 50 years.  She is a retired Pharmacist, hospital. PCP - Dr. Alinda Deem.    PAST MEDICAL HISTORY :   has a past medical history of Leukocytosis; COPD (chronic obstructive pulmonary disease) ([place]); Allergic rhinitis; DM (diabetes mellitus) ([place]); Bipolar affective disorder ([place]); Hyperlipidemia; Smoking; Cough (10/30/2011); SOB (shortness of breath) (10/30/2011); and Mediastinal adenopathy (11/26/2011).  has past surgical history that includes Cataract extraction; Cesarean section; and Colonoscopy (2008).   Prior to Admission medications   Medication Sig Start Date End Date Taking? Authorizing Provider  albuterol (PROVENTIL HFA;VENTOLIN HFA) 108 (90 BASE) MCG/ACT inhaler Inhale 2 puffs into the lungs every 6 (six) hours as needed for wheezing.   Yes Historical Provider, MD  aspirin 81 MG tablet Take 81 mg by mouth daily.   Yes Historical Provider, MD  cetirizine (ZYRTEC) 10 MG tablet Take 10 mg by mouth daily.   Yes Historical Provider, MD  Cholecalciferol (VITAMIN D3) 3000 UNITS TABS Take 1 tablet by mouth daily.    Yes Historical Provider, MD  Cinnamon 500 MG TABS Take 1 tablet by mouth daily.    Yes Historical Provider, MD  citalopram (CELEXA) 20 MG tablet Take 1 tablet by mouth daily. 01/24/15  Yes Historical Provider, MD  clonazePAM (KLONOPIN) 1 MG tablet Take 1 tablet by mouth daily. 12/11/14  Yes Historical Provider, MD  [place]  CAPS Take 1 capsule by mouth daily.    Yes Historical Provider, MD  fluticasone (FLONASE) 50 MCG/ACT nasal spray Place 1 spray into both nostrils daily as needed for allergies.  08/02/13  Yes Historical Provider, MD   glimepiride (AMARYL) 4 MG tablet Take 1 tablet by mouth daily. 01/23/15  Yes Historical Provider, MD  lisinopril (PRINIVIL,ZESTRIL) 5 MG tablet Take 5 mg by mouth daily.  08/02/13  Yes Historical Provider, MD  lovastatin (MEVACOR) 20 MG tablet Take 20 mg by mouth at bedtime.  08/16/13  Yes Historical Provider, MD  metFORMIN (GLUCOPHAGE-XR) 500 MG 24 hr tablet Take 1 tablet by mouth 2 (two) times daily. 01/23/15  Yes Historical Provider, MD  Multiple Vitamins-Minerals (WOMENS MULTIVITAMIN PLUS PO) Take 1 tablet by mouth daily.    Yes Historical Provider, MD  UNABLE TO FIND Take 1 tablet by mouth daily. Med Name:  [place]   Yes Historical Provider, MD  vitamin C (ASCORBIC ACID) 500 MG tablet Take 500 mg by mouth daily.   Yes Historical Provider, MD   Allergies  Allergen Reactions  . Penicillins Hives and Swelling    Has patient had a PCN reaction causing immediate rash, facial/tongue/throat swelling, SOB or lightheadedness with hypotension: Yes Has patient had a PCN reaction causing severe rash involving mucus membranes or skin necrosis: Yes Has patient had a PCN reaction that required hospitalization Yes- hospital for 7 days Has patient had a PCN reaction occurring within the last 10 years: No If all of the above answers are "NO", then may proceed with Cephalosporin use.     FAMILY HISTORY:  indicated that her mother is deceased. She indicated that her father is deceased. She indicated that both of her sisters are alive. She indicated that her brother is alive.    SOCIAL HISTORY:  reports that she has been smoking.  She has never used smokeless tobacco. She reports that she does not drink alcohol or use illicit drugs.  REVIEW OF SYSTEMS:   Gen: Denies fever, chills, weight change, fatigue, night sweats HEENT: Denies blurred vision, double vision, hearing loss, tinnitus, sinus congestion, rhinorrhea, sore throat, neck stiffness, dysphagia PULM: Denies shortness of breath, cough, sputum  production, hemoptysis, wheezing CV: Denies chest pain, edema, orthopnea, paroxysmal nocturnal dyspnea, palpitations GI: Denies abdominal pain, nausea, vomiting, diarrhea, hematochezia, melena, constipation, change in bowel habits GU: Denies dysuria, hematuria, polyuria, oliguria, urethral discharge Endocrine: Denies hot or cold intolerance, polyuria, polyphagia or appetite change Derm: Denies rash, dry skin, scaling or peeling skin change Heme: Denies easy bruising, bleeding, bleeding gums Neuro: Denies headache, numbness, weakness, slurred speech, loss of memory or consciousness   SUBJECTIVE:   VITAL SIGNS: Temp:  [97.6 F (36.4 C)-97.9 F (36.6 C)] 97.9 F (36.6 C) (10/19 1317) Pulse Rate:  [80-122] 97 (10/19 1430) Resp:  [20-43] 29 (10/19 1430) BP: (88-122)/(50-71) 122/60 mmHg (10/19 1430) SpO2:  [77 %-95 %] 95 % (10/19 1430) Weight:  [172 lb (78.019 kg)] 172 lb (78.019 kg) (10/19 1155)   HEMODYNAMICS:     VENTILATOR SETTINGS:     INTAKE / OUTPUT:  Intake/Output Summary (Last 24 hours) at 01/31/15 1439 Last data filed at 01/31/15 1415  Gross per 24 hour  Intake   1000 ml  Output    400 ml  Net    600 ml    PHYSICAL EXAMINATION: General:  wdwn adult female in NAD  Neuro:  AAOx4, speech clear, MAE  HEENT:  MM pink/moist, no jvd Cardiovascular:  s1s2 rrr, no  m/r/g, SR on monitor  Lungs:  resp's even/non-labored, tachypnea but no distress, lungs bilaterally clear anterior/lateral  Abdomen:  Tender to palpation, BSx4 + Musculoskeletal:  No acute deformities  Skin:  Warm/dry, no edema   LABS:  CBC  Recent Labs Lab 01/31/15 1010  WBC 18.4*  HGB 15.5*  HCT 44.9  PLT 254   Coag's No results for input(s): APTT, INR in the last 168 hours.   BMET  Recent Labs Lab 01/31/15 1010 01/31/15 1136  NA 139 139  K 3.8 3.2*  CL 100* 100*  CO2 25 27  BUN 64* 62*  CREATININE 0.87 1.04*  GLUCOSE 43* 166*   Electrolytes  Recent Labs Lab 01/31/15 1010  01/31/15 1136  CALCIUM 9.6 9.2   Sepsis Markers  Recent Labs Lab 01/31/15 1020 01/31/15 1406  LATICACIDVEN 1.98 1.91   ABG No results for input(s): PHART, PCO2ART, PO2ART in the last 168 hours.   Liver Enzymes  Recent Labs Lab 01/31/15 1136  AST 28  ALT 18  ALKPHOS 81  BILITOT 1.0  ALBUMIN 2.8*   Cardiac Enzymes No results for input(s): TROPONINI, PROBNP in the last 168 hours.   Glucose  Recent Labs Lab 01/31/15 1202  GLUCAP 128*    Imaging Ct Abdomen Pelvis Wo Contrast  01/31/2015  CLINICAL DATA:  Abdominal distension. Abdominal pain. Weakness. Leukocytosis. EXAM: CT ABDOMEN AND PELVIS WITHOUT CONTRAST TECHNIQUE: Multidetector CT imaging of the abdomen and pelvis was performed following the standard protocol without IV contrast. COMPARISON:  Chest CT 08/24/2014 FINDINGS: Subpleural opacities in the right lower lobe likely reflect dependent subsegmental atelectasis and trace pleural fluid or thickening. There is minimal atelectasis or scarring in the left lung base. Coronary artery calcification is noted. Pericardial effusion measures up to 1.5 cm in thickness and overall appears similar in volume to the prior chest CT although with a greater amount of the fluid located posteriorly on the current examination. Small gallstones are noted. No biliary dilatation is seen. The liver, kidneys, and pancreas have an unremarkable unenhanced appearance. A subcentimeter calcification in the spleen is unchanged, as is minimal bilateral adrenal gland thickening. There is the suggestion of mild distal esophageal wall thickening with a small amount of fluid or debris present, incompletely visualized. The appendix is identified in the right lower and central abdomen and is mildly distended and thick walled diffusely, measuring up to 13 mm in diameter. There is mild surrounding inflammatory stranding, and there is a small locule of extraluminal gas immediately inferior to the proximal appendix  (series 2, image 69). More inferiorly in the right adnexa is a 4.2 x 3.6 cm gas and fluid collection. There is additional fluid and gas in the left pelvis which appears less organized, measuring approximately 9.6 x 3.9 cm (series 2, image 78). There is moderate dilatation of multiple proximal and mid small bowel loops containing air-fluid levels and measuring up to 5.6 cm in diameter. Gas and fluid are also present an nondilated colon. There is mild sigmoid colon wall thickening which is favored to be secondary to adjacent pelvic inflammation. Bladder is unremarkable. Uterus is present. Extensive atherosclerotic vascular calcification is noted. Lower lumbar facet arthrosis is noted. Subcentimeter retroperitoneal lymph nodes are likely reactive. IMPRESSION: 1. Findings most compatible with ruptured appendicitis with likely developing pelvic abscesses. 2. Moderate small bowel dilatation favored to reflect ileus rather than mechanical obstruction. 3. Cholelithiasis. 4. Unchanged pericardial effusion. 5. Distal esophageal wall thickening, query esophagitis. These results were called by telephone at the time  of interpretation on 01/31/2015 at 11:51 am to Dr. Tyrone Nine, who verbally acknowledged these results. Electronically Signed   By: Logan Bores M.D.   On: 01/31/2015 11:58   Dg Chest 2 View  01/31/2015  CLINICAL DATA:  Cough and congestion with increased weakness over the past 5 days. EXAM: CHEST  2 VIEW COMPARISON:  Chest CT 08/24/2014 FINDINGS: Cardiopericardial enlargement appears increased compared to scanogram from previous study, when pericardial effusion was present, but there is rotation to the left. Stable aortic and hilar contours. Poorly defined left diaphragm, likely from cardiac contact, with no pleural effusion seen on lateral imaging. Mild interstitial coarsening correlating with emphysema on previous CT. There is no edema, consolidation, or pneumothorax. IMPRESSION: 1. Cardiopericardial enlargement  with apparent increase from chest CT 08/24/2014 potentially due to rotation. Given a pericardial effusion was present on the comparison CT, consider echocardiogram follow-up. 2. Emphysema without superimposed pneumonia or edema. Electronically Signed   By: Monte Fantasia M.D.   On: 01/31/2015 10:33     ASSESSMENT / PLAN:  PULMONARY OETT n/a A: Tobacco Abuse  Hx COPD, Allergic Rhinitis, Mediastinal Adenopathy  P:   Pulmonary hygiene: IS, mobilize as tolerated PRN albuterol with smoking history  Smoking cessation  CARDIOVASCULAR CVL A:  Transient Hypotension - in setting of ruptured appendix Pericardial Effusion - hx of, noted on CT Chest 08/2014, appears enlarged on admit CXR but CT comparison unchanged HLD  P:  SDU monitoring  NS @ 100 ml/hr Assess EKG in am  Hold home lisinopril, lovastatin  RENAL A:   Hypokalemia  At Risk AKI P:   Trend BMP / UOP  Replace electrolytes as indicated  Hold nephrotoxic agents   GASTROINTESTINAL A:   Nausea / Vomiting Ruptured Appendix  Concern for developing pelvic abscess Cholelithiasis  P:   NPO  Defer feeding to CCS  Surgery following  See ID   HEMATOLOGIC A:   Leukocytosis - in setting of intra-abdominal process  P:  See ID  Monitor CBC  Heparin for DVT prophylaxis   INFECTIOUS A:   Ruptured Appendix  Concern for developing pelvic abscess P:   BCx2 10/19 >>  UC 10/19 >>   Cipro, start date 10/19, day 1/x Flagyl, start date 10/19, day 1/x  Follow cultures PCT protocol   ENDOCRINE A:   Hypoglycemia  DM II   P:   CBG Q4 while NPO  May need dextrose added to IVF  Hold home metformin, glimepiride  NEUROLOGIC A:   Pain  Bipolar Disorder  Anxiety  P:   RASS goal: n/a PRN fentanyl for pain  Hold home celexa,  klonopin while NPO PRN ativan for anxiety    FAMILY  - Updates: Sister Drenda Freeze) updated on patients status  - Inter-disciplinary family meet or Palliative Care meeting due by:   10/26   Noe Gens, NP-C [facility] Pgr: [phone] or if no answer [phone] 01/31/2015, 2:39 PM

## 2015-01-31 NOTE — ED Notes (Signed)
Surgery PA at bedside.  

## 2015-01-31 NOTE — Consult Note (Signed)
Reason for Consult:  Ruptured appendix with pelvic abscess Referring Physician: Harlene Ramus PA-C (ED)  Mercedes Thornton is an 69 y.o. female.  HPI: Pt presented with flu like symptoms.  She reported abdominal pain, nausea, and vomiting that started last weekend 01/27/15.  She was sick all day, Sunday she continued to have pain, but nausea and vomiting not as bad.  She has been on clear fluids since Sunday.  She reports shakes and chills, followed by sweating, pain remains mostly right mid and RLQ.  She called her Psychiatrist on Monday thinking it might be due to the Lexapro.  She has not improved and came to the Ed with complaints of a "flu."  She had a large abdomen and was hypotensive with respiratory rate in the 40's.  CXR show Pericardial enlargement and she  has a hx of  Reactive leukocytosis due to her COPD/chronic bronchitis.  She also has history of lung nodules and hx of tobacco use.  She also reports decreased urine output.  Work up in the ED shows hypotension with BP of 88/55,  RR of 43, no fever, sats 93% on [place].  BMP OK, the WBC is 18.4, H/H:  15.5/44.9.  CT of the abdomen and pelvis without contrast shows:   The appendix is identified in the right lower and central abdomen and is mildly distended and thick walled diffusely, measuring up to 13 mm in diameter. There is mild surrounding inflammatory stranding, and there is a small locule of extraluminal gas immediately inferior to the proximal appendix . More inferiorly in the right adnexa is a 4.2 x 3.6 cm gas and fluid collection. There is additional fluid and gas in the left pelvis which appears less organized, measuring approximately 9.6 x 3.9 cm.  We are called to see  Past Medical History  Diagnosis Date  Leukocytosis  Followed by Dr. Alvy Bimler   COPD (chronic obstructive pulmonary disease) ([place])   Allergic rhinitis   DM (diabetes mellitus) ([place])   Bipolar affective disorder ([place])   Hyperlipidemia   Smoking   Cough 10/30/2011  SOB  (shortness of breath) 10/30/2011  Mediastinal adenopathy 11/26/2011    Past Surgical History  Procedure Laterality Date  . Cataract extraction    . Cesarean section    . Colonoscopy  2008    negative per pt's report, with [facility]     Family History  Problem Relation Age of Onset  . Stroke Mother   . Cancer Father     prostate    Social History:  reports that she has been smoking.  She has never used smokeless tobacco. She reports that she does not drink alcohol or use illicit drugs.  Allergies:  Allergies  Allergen Reactions  . Penicillins Hives and Swelling    Has patient had a PCN reaction causing immediate rash, facial/tongue/throat swelling, SOB or lightheadedness with hypotension: Yes Has patient had a PCN reaction causing severe rash involving mucus membranes or skin necrosis: Yes Has patient had a PCN reaction that required hospitalization Yes- hospital for 7 days Has patient had a PCN reaction occurring within the last 10 years: No If all of the above answers are "NO", then may proceed with Cephalosporin use.     Medications:  Prior to Admission:  Prior to Admission medications   Medication Sig Start Date End Date Taking? Authorizing Provider  albuterol (PROVENTIL HFA;VENTOLIN HFA) 108 (90 BASE) MCG/ACT inhaler Inhale 2 puffs into the lungs every 6 (six) hours as needed for wheezing.  Yes Historical Provider, MD  aspirin 81 MG tablet Take 81 mg by mouth daily.   Yes Historical Provider, MD  cetirizine (ZYRTEC) 10 MG tablet Take 10 mg by mouth daily.   Yes Historical Provider, MD  Cholecalciferol (VITAMIN D3) 3000 UNITS TABS Take 1 tablet by mouth daily.    Yes Historical Provider, MD  Cinnamon 500 MG TABS Take 1 tablet by mouth daily.    Yes Historical Provider, MD  citalopram (CELEXA) 20 MG tablet Take 1 tablet by mouth daily. 01/24/15  Yes Historical Provider, MD  clonazePAM (KLONOPIN) 1 MG tablet Take 1 tablet by mouth daily. 12/11/14  Yes Historical Provider, MD   Cod Liver Oil CAPS Take 1 capsule by mouth daily.    Yes Historical Provider, MD  fluticasone (FLONASE) 50 MCG/ACT nasal spray Place 1 spray into both nostrils daily as needed for allergies.  08/02/13  Yes Historical Provider, MD  glimepiride (AMARYL) 4 MG tablet Take 1 tablet by mouth daily. 01/23/15  Yes Historical Provider, MD  lisinopril (PRINIVIL,ZESTRIL) 5 MG tablet Take 5 mg by mouth daily.  08/02/13  Yes Historical Provider, MD  lovastatin (MEVACOR) 20 MG tablet Take 20 mg by mouth at bedtime.  08/16/13  Yes Historical Provider, MD  metFORMIN (GLUCOPHAGE-XR) 500 MG 24 hr tablet Take 1 tablet by mouth 2 (two) times daily. 01/23/15  Yes Historical Provider, MD  Multiple Vitamins-Minerals (WOMENS MULTIVITAMIN PLUS PO) Take 1 tablet by mouth daily.    Yes Historical Provider, MD  UNABLE TO FIND Take 1 tablet by mouth daily. Med Name:  Tumeric   Yes Historical Provider, MD  vitamin C (ASCORBIC ACID) 500 MG tablet Take 500 mg by mouth daily.   Yes Historical Provider, MD    Scheduled: .  morphine injection  4 mg Intravenous Once   Continuous: . metronidazole     PRN: Anti-infectives    Start     Dose/Rate Route Frequency Ordered Stop   01/31/15 1215  metroNIDAZOLE (FLAGYL) IVPB 500 mg     500 mg 100 mL/hr over 60 Minutes Intravenous  Once 01/31/15 1201     01/31/15 1100  cefTRIAXone (ROCEPHIN) 1 g in dextrose 5 % 50 mL IVPB     1 g 100 mL/hr over 30 Minutes Intravenous  Once 01/31/15 1051 01/31/15 1222      Results for orders placed or performed during the hospital encounter of 01/31/15 (from the past 48 hour(s))  Basic metabolic panel     Status: Abnormal   Collection Time: 01/31/15 10:10 AM  Result Value Ref Range   Sodium 139 135 - 145 mmol/L   Potassium 3.8 3.5 - 5.1 mmol/L   Chloride 100 (L) 101 - 111 mmol/L   CO2 25 22 - 32 mmol/L   Glucose, Bld 43 (LL) 65 - 99 mg/dL    Comment: CRITICAL RESULT CALLED TO, READ BACK BY AND VERIFIED WITH: BINGHAM,S @ 1120 ON 101916 BY  POTEAT,S    BUN 64 (H) 6 - 20 mg/dL   Creatinine, Ser 0.87 0.44 - 1.00 mg/dL   Calcium 9.6 8.9 - 10.3 mg/dL   GFR calc non Af Amer >60 >60 mL/min   GFR calc Af Amer >60 >60 mL/min    Comment: (NOTE) The eGFR has been calculated using the CKD EPI equation. This calculation has not been validated in all clinical situations. eGFR's persistently <60 mL/min signify possible Chronic Kidney Disease.    Anion gap 14 5 - 15  CBC with Differential  Status: Abnormal   Collection Time: 01/31/15 10:10 AM  Result Value Ref Range   WBC 18.4 (H) 4.0 - 10.5 K/uL   RBC 5.27 (H) 3.87 - 5.11 MIL/uL   Hemoglobin 15.5 (H) 12.0 - 15.0 g/dL   HCT 44.9 36.0 - 46.0 %   MCV 85.2 78.0 - 100.0 fL   MCH 29.4 26.0 - 34.0 pg   MCHC 34.5 30.0 - 36.0 g/dL   RDW 14.2 11.5 - 15.5 %   Platelets 254 150 - 400 K/uL   Neutrophils Relative % 87 %   Lymphocytes Relative 4 %   Monocytes Relative 9 %   Eosinophils Relative 0 %   Basophils Relative 0 %   Neutro Abs 16.0 (H) 1.7 - 7.7 K/uL   Lymphs Abs 0.7 0.7 - 4.0 K/uL   Monocytes Absolute 1.7 (H) 0.1 - 1.0 K/uL   Eosinophils Absolute 0.0 0.0 - 0.7 K/uL   Basophils Absolute 0.0 0.0 - 0.1 K/uL   WBC Morphology TOXIC GRANULATION   I-Stat CG4 Lactic Acid, ED     Status: None   Collection Time: 01/31/15 10:20 AM  Result Value Ref Range   Lactic Acid, Venous 1.98 0.5 - 2.0 mmol/L  CBG monitoring, ED     Status: Abnormal   Collection Time: 01/31/15 12:02 PM  Result Value Ref Range   Glucose-Capillary 128 (H) 65 - 99 mg/dL    Ct Abdomen Pelvis Wo Contrast  01/31/2015  CLINICAL DATA:  Abdominal distension. Abdominal pain. Weakness. Leukocytosis. EXAM: CT ABDOMEN AND PELVIS WITHOUT CONTRAST TECHNIQUE: Multidetector CT imaging of the abdomen and pelvis was performed following the standard protocol without IV contrast. COMPARISON:  Chest CT 08/24/2014 FINDINGS: Subpleural opacities in the right lower lobe likely reflect dependent subsegmental atelectasis and trace  pleural fluid or thickening. There is minimal atelectasis or scarring in the left lung base. Coronary artery calcification is noted. Pericardial effusion measures up to 1.5 cm in thickness and overall appears similar in volume to the prior chest CT although with a greater amount of the fluid located posteriorly on the current examination. Small gallstones are noted. No biliary dilatation is seen. The liver, kidneys, and pancreas have an unremarkable unenhanced appearance. A subcentimeter calcification in the spleen is unchanged, as is minimal bilateral adrenal gland thickening. There is the suggestion of mild distal esophageal wall thickening with a small amount of fluid or debris present, incompletely visualized. The appendix is identified in the right lower and central abdomen and is mildly distended and thick walled diffusely, measuring up to 13 mm in diameter. There is mild surrounding inflammatory stranding, and there is a small locule of extraluminal gas immediately inferior to the proximal appendix (series 2, image 69). More inferiorly in the right adnexa is a 4.2 x 3.6 cm gas and fluid collection. There is additional fluid and gas in the left pelvis which appears less organized, measuring approximately 9.6 x 3.9 cm (series 2, image 78). There is moderate dilatation of multiple proximal and mid small bowel loops containing air-fluid levels and measuring up to 5.6 cm in diameter. Gas and fluid are also present an nondilated colon. There is mild sigmoid colon wall thickening which is favored to be secondary to adjacent pelvic inflammation. Bladder is unremarkable. Uterus is present. Extensive atherosclerotic vascular calcification is noted. Lower lumbar facet arthrosis is noted. Subcentimeter retroperitoneal lymph nodes are likely reactive. IMPRESSION: 1. Findings most compatible with ruptured appendicitis with likely developing pelvic abscesses. 2. Moderate small bowel dilatation favored to reflect  ileus  rather than mechanical obstruction. 3. Cholelithiasis. 4. Unchanged pericardial effusion. 5. Distal esophageal wall thickening, query esophagitis. These results were called by telephone at the time of interpretation on 01/31/2015 at 11:51 am to Dr. Tyrone Nine, who verbally acknowledged these results. Electronically Signed   By: Logan Bores M.D.   On: 01/31/2015 11:58   Dg Chest 2 View  01/31/2015  CLINICAL DATA:  Cough and congestion with increased weakness over the past 5 days. EXAM: CHEST  2 VIEW COMPARISON:  Chest CT 08/24/2014 FINDINGS: Cardiopericardial enlargement appears increased compared to scanogram from previous study, when pericardial effusion was present, but there is rotation to the left. Stable aortic and hilar contours. Poorly defined left diaphragm, likely from cardiac contact, with no pleural effusion seen on lateral imaging. Mild interstitial coarsening correlating with emphysema on previous CT. There is no edema, consolidation, or pneumothorax. IMPRESSION: 1. Cardiopericardial enlargement with apparent increase from chest CT 08/24/2014 potentially due to rotation. Given a pericardial effusion was present on the comparison CT, consider echocardiogram follow-up. 2. Emphysema without superimposed pneumonia or edema. Electronically Signed   By: Monte Fantasia M.D.   On: 01/31/2015 10:33    Review of Systems  Constitutional: Positive for chills and weight loss. Negative for fever.  HENT: Negative.   Eyes: Negative.   Respiratory: Positive for cough, sputum production (white) and shortness of breath (DOE). Negative for wheezing.   Cardiovascular: Negative.   Gastrointestinal: Positive for nausea, vomiting, abdominal pain and diarrhea.       Symptoms started 01/27/15, nausea and vomiting better after the first 24 hours, but she has been on mostly liquids  Genitourinary:       Decreased urine output for the last 2 days  Musculoskeletal: Positive for joint pain (ankles).  Skin: Negative.    Neurological: Negative.   Endo/Heme/Allergies:       She reports her sugar was running low at home and she does not take the meds as prescribed at times.  Psychiatric/Behavioral: Positive for depression. The patient is nervous/anxious.        Bipolar   Blood pressure 88/55, pulse 113, temperature 97.6 F (36.4 C), temperature source Oral, resp. rate 43, weight 78.019 kg (172 lb), SpO2 92 %. Physical Exam  Constitutional: She is oriented to person, place, and time. She appears well-developed and well-nourished. She appears distressed.  HENT:  Head: Normocephalic and atraumatic.  Nose: Nose normal.  Eyes: Conjunctivae and EOM are normal. Right eye exhibits no discharge. Left eye exhibits no discharge. No scleral icterus.  Neck: Normal range of motion. Neck supple. No JVD present. No tracheal deviation present. No thyromegaly present.  Cardiovascular: Normal rate, regular rhythm, normal heart sounds and intact distal pulses.   No murmur heard. Respiratory: Effort normal and breath sounds normal. No respiratory distress. She has no wheezes. She has no rales. She exhibits no tenderness.  GI: Soft. She exhibits distension. She exhibits no mass. There is tenderness (mid lower abdomen). There is no rebound and no guarding.  Musculoskeletal: She exhibits no edema.  Lymphadenopathy:    She has no cervical adenopathy.  Neurological: She is alert and oriented to person, place, and time. No cranial nerve deficit.  Skin: Skin is warm and dry. No rash noted. She is not diaphoretic. No erythema. No pallor.  Psychiatric: She has a normal mood and affect. Her behavior is normal. Judgment and thought content normal.    Assessment/Plan: Ruptured appendix with abscess Hypotension Tachypnea with some decreased O2 saturation  Pericardial  effusion COPD/ongoing tobacco use/mediastinal adenopathy/Chronic DOE Chronic leukocytosis  AODM Bipolar/depression Hyperlipidemia  Plan:  I have ask Medicine to  admit.  Dr. Lucia Gaskins will review the studies with radiology, but I think she will need drains and long term antibiotics.  Dr. Lucia Gaskins will review and see shortly.  Keep her NPO for now.   JENNINGS,WILLARD 01/31/2015, 12:24 PM    Unclear CT scan.  Appendix has appendolith.  Fluid in pelvis.  Sigmoid colon without obvious diverticular disease. She has a lot of medical issues, particularly pulmonary.  Will plan laparoscopic/possible open appendectomy/exploration.  Sister, daughter Veterinary surgeon), and former husband Education officer, community) at bed side.  Alphonsa Overall, MD, [facility] Surgery Pager: [phone] Office phone:  [phone]

## 2015-01-31 NOTE — ED Notes (Signed)
Delay in labs pt not in room

## 2015-01-31 NOTE — ED Notes (Signed)
Critical care NP at bedside.  

## 2015-01-31 NOTE — ED Notes (Signed)
Per EMS-flu like symptoms since Friday-chills, N/V-CBG 46 given oral glucose, CBG 71-shallow breathing, productive cough

## 2015-01-31 NOTE — ED Notes (Signed)
Off floor for testing 

## 2015-01-31 NOTE — Progress Notes (Addendum)
ANTIBIOTIC CONSULT NOTE - INITIAL  Pharmacy Consult for ciprofloxacin/metronidazole Indication: Rupture appendix with pelvic abscess  Allergies  Allergen Reactions  . Penicillins Hives and Swelling    Has patient had a PCN reaction causing immediate rash, facial/tongue/throat swelling, SOB or lightheadedness with hypotension: Yes Has patient had a PCN reaction causing severe rash involving mucus membranes or skin necrosis: Yes Has patient had a PCN reaction that required hospitalization Yes- hospital for 7 days Has patient had a PCN reaction occurring within the last 10 years: No If all of the above answers are "NO", then may proceed with Cephalosporin use.     Patient Measurements: Weight: 172 lb (78.019 kg) Adjusted Body Weight:   Vital Signs: Temp: 97.9 F (36.6 C) (10/19 1317) Temp Source: Oral (10/19 1317) BP: 121/69 mmHg (10/19 1500) Pulse Rate: 95 (10/19 1500) Intake/Output from previous day:   Intake/Output from this shift: Total I/O In: 1000 [I.V.:1000] Out: 400 [Urine:400]  Labs:  Recent Labs  01/31/15 1010 01/31/15 1136  WBC 18.4*  --   HGB 15.5*  --   PLT 254  --   CREATININE 0.87 1.04*   Estimated Creatinine Clearance: 52.3 mL/min (by C-G formula based on Cr of 1.04). No results for input(s): VANCOTROUGH, VANCOPEAK, VANCORANDOM, GENTTROUGH, GENTPEAK, GENTRANDOM, TOBRATROUGH, TOBRAPEAK, TOBRARND, AMIKACINPEAK, AMIKACINTROU, AMIKACIN in the last 72 hours.   Microbiology: Recent Results (from the past 720 hour(s))  Blood Culture (routine x 2)     Status: None (Preliminary result)   Collection Time: 01/31/15 11:50 AM  Result Value Ref Range Status   Specimen Description BLOOD LEFT ANTECUBITAL  Final   Special Requests   Final    BOTTLES DRAWN AEROBIC AND ANAEROBIC 5CC Performed at [facility]    Culture PENDING  Incomplete   Report Status PENDING  Incomplete  Blood Culture (routine x 2)     Status: None (Preliminary result)   Collection  Time: 01/31/15 12:02 PM  Result Value Ref Range Status   Specimen Description BLOOD LEFT HAND  Final   Special Requests   Final    IN PEDIATRIC BOTTLE 2CC Performed at [facility]    Culture PENDING  Incomplete   Report Status PENDING  Incomplete    Medical History: Past Medical History  Diagnosis Date  . Leukocytosis   . COPD (chronic obstructive pulmonary disease) ([place])   . Allergic rhinitis   . DM (diabetes mellitus) ([place])   . Bipolar affective disorder ([place])   . Hyperlipidemia   . Smoking   . Cough 10/30/2011  . SOB (shortness of breath) 10/30/2011  . Mediastinal adenopathy 11/26/2011   Assessment: 54 YOF presents with abd pain and N/V.  Found to have ruptured appendix and developing abscess.  Pharmacy asked to dose ciprofloxacin (ceftriaxone stopped for PCN allergy) and metronidazole.  Renal: Scr slightly elevated WBC elevated  Goal of Therapy:  Dose for indication and for patient-specific parameters  Plan:   Ciprofloxacin 400mg  IV q12h  Metronidazole 500mg  IV q8h - no adjustment needed  Doreene Eland, PharmD, BCPS.   Pager: [phone] 01/31/2015,3:42 PM

## 2015-01-31 NOTE — ED Notes (Signed)
PA at bedside.

## 2015-01-31 NOTE — Progress Notes (Signed)
[facility] Progress Note Patient Name: Mercedes Thornton DOB: 28-Jun-1945 MRN: [id number]   Date of Service  01/31/2015  HPI/Events of Note  Returns from surgery for perforated appendix intubated and mechanically ventilated.   eICU Interventions  Will order: 1. Ventilator orders. 2. ABG in 1 hour. 3. CXR now. 4. Change Abx to Vancomycin, Primaxin and Diflucan.          Anup Brigham Eugene 01/31/2015, 8:24 PM

## 2015-01-31 NOTE — Anesthesia Preprocedure Evaluation (Addendum)
Anesthesia Evaluation  Patient identified by MRN, date of birth, ID band Patient awake    Reviewed: Allergy & Precautions, NPO status , Patient's Chart, lab work & pertinent test results  History of Anesthesia Complications Negative for: history of anesthetic complications  Airway Mallampati: III  TM Distance: >3 FB Neck ROM: Full    Dental  (+) Dental Advisory Given, Poor Dentition   Pulmonary shortness of breath, COPD,  COPD inhaler, Current Smoker,    Pulmonary exam normal breath sounds clear to auscultation       Cardiovascular hypertension, Pt. on medications (-) angina(-) CAD and (-) Past MI negative cardio ROS Normal cardiovascular exam Rhythm:Regular Rate:Normal     Neuro/Psych PSYCHIATRIC DISORDERS Bipolar Disorder negative neurological ROS     GI/Hepatic Neg liver ROS,   Endo/Other  diabetes, Type 2, Oral Hypoglycemic Agents  Renal/GU negative Renal ROS     Musculoskeletal negative musculoskeletal ROS (+)   Abdominal   Peds  Hematology negative hematology ROS (+)   Anesthesia Other Findings Day of surgery medications reviewed with the patient.  Reproductive/Obstetrics                            Anesthesia Physical Anesthesia Plan  ASA: III and emergent  Anesthesia Plan: General   Post-op Pain Management:    Induction: Intravenous  Airway Management Planned: Oral ETT  Additional Equipment: Arterial line  Intra-op Plan:   Post-operative Plan: Possible Post-op intubation/ventilation  Informed Consent: I have reviewed the patients History and Physical, chart, labs and discussed the procedure including the risks, benefits and alternatives for the proposed anesthesia with the patient or authorized representative who has indicated his/her understanding and acceptance.   Dental advisory given  Plan Discussed with: CRNA  Anesthesia Plan Comments: (Risks/benefits of  general anesthesia discussed with patient including risk of damage to teeth, lips, gum, and tongue, nausea/vomiting, allergic reactions to medications, and the possibility of heart attack, stroke and death.  All patient questions answered.  Patient wishes to proceed.)        Anesthesia Quick Evaluation

## 2015-01-31 NOTE — ED Provider Notes (Signed)
CSN: [id number]     Arrival date & time 01/31/15  0945 History   First MD Initiated Contact with Patient 01/31/15 1009     Chief Complaint  Patient presents with  . flu like symptoms      (Consider location/radiation/quality/duration/timing/severity/associated sxs/prior Treatment) HPI Comments: Patient is a 69 year old female with past medical history of diabetes and COPD who presents to the ED with complaints of "flu like sxs", onset 5 days. Patient reports she has been having a very, chills, chest congestion, cough, SOB, nausea, vomiting, diarrhea, weakness and decreased urine. She states she started having lower abdominal discomfort with worsening N/V/D throughout the weekend. She states she has been having difficulty urinating, endorses only producing a small output with each urination. She states she has been using Motrin at home for her fever. Denies HA, CP, blood in stool or urine, vaginal bleeding, vaginal d/c. Denies any sick contacts.   Past Medical History  Diagnosis Date  . Leukocytosis   . COPD (chronic obstructive pulmonary disease) ([place])   . Allergic rhinitis   . DM (diabetes mellitus) ([place])   . Bipolar affective disorder ([place])   . Hyperlipidemia   . Smoking   . Cough 10/30/2011  . SOB (shortness of breath) 10/30/2011  . Mediastinal adenopathy 11/26/2011   Past Surgical History  Procedure Laterality Date  . Cataract extraction    . Cesarean section    . Colonoscopy  2008    negative per pt's report, with [facility]    Family History  Problem Relation Age of Onset  . Stroke Mother   . Cancer Father     prostate   Social History  Substance Use Topics  . Smoking status: Current Every Day Smoker -- 0.25 packs/day for 40 years  . Smokeless tobacco: Never Used  . Alcohol Use: No   OB History    No data available     Review of Systems  Constitutional: Positive for fever and chills.  HENT: Positive for congestion.   Respiratory: Positive for cough and shortness  of breath.   Gastrointestinal: Positive for nausea, vomiting, abdominal pain and diarrhea.  Genitourinary: Positive for difficulty urinating.  Musculoskeletal: Positive for myalgias.  Neurological: Positive for weakness.  All other systems reviewed and are negative.     Allergies  Penicillins  Home Medications   Prior to Admission medications   Medication Sig Start Date End Date Taking? Authorizing Provider  albuterol (PROVENTIL HFA;VENTOLIN HFA) 108 (90 BASE) MCG/ACT inhaler Inhale 2 puffs into the lungs every 6 (six) hours as needed for wheezing.   Yes Historical Provider, MD  aspirin 81 MG tablet Take 81 mg by mouth daily.   Yes Historical Provider, MD  cetirizine (ZYRTEC) 10 MG tablet Take 10 mg by mouth daily.   Yes Historical Provider, MD  Cholecalciferol (VITAMIN D3) 3000 UNITS TABS Take 1 tablet by mouth daily.    Yes Historical Provider, MD  Cinnamon 500 MG TABS Take 1 tablet by mouth daily.    Yes Historical Provider, MD  citalopram (CELEXA) 20 MG tablet Take 1 tablet by mouth daily. 01/24/15  Yes Historical Provider, MD  clonazePAM (KLONOPIN) 1 MG tablet Take 1 tablet by mouth daily. 12/11/14  Yes Historical Provider, MD  Cod Liver Oil CAPS Take 1 capsule by mouth daily.    Yes Historical Provider, MD  fluticasone (FLONASE) 50 MCG/ACT nasal spray Place 1 spray into both nostrils daily as needed for allergies.  08/02/13  Yes Historical Provider, MD  glimepiride (  AMARYL) 4 MG tablet Take 1 tablet by mouth daily. 01/23/15  Yes Historical Provider, MD  lisinopril (PRINIVIL,ZESTRIL) 5 MG tablet Take 5 mg by mouth daily.  08/02/13  Yes Historical Provider, MD  lovastatin (MEVACOR) 20 MG tablet Take 20 mg by mouth at bedtime.  08/16/13  Yes Historical Provider, MD  metFORMIN (GLUCOPHAGE-XR) 500 MG 24 hr tablet Take 1 tablet by mouth 2 (two) times daily. 01/23/15  Yes Historical Provider, MD  Multiple Vitamins-Minerals (WOMENS MULTIVITAMIN PLUS PO) Take 1 tablet by mouth daily.    Yes  Historical Provider, MD  UNABLE TO FIND Take 1 tablet by mouth daily. Med Name:  Tumeric   Yes Historical Provider, MD  vitamin C (ASCORBIC ACID) 500 MG tablet Take 500 mg by mouth daily.   Yes Historical Provider, MD   BP 88/55 mmHg  Pulse 113  Temp(Src) 97.6 F (36.4 C) (Oral)  Resp 43  Wt 172 lb (78.019 kg)  SpO2 92% Physical Exam  Constitutional: She is oriented to person, place, and time. She appears well-developed and well-nourished.  HENT:  Head: Normocephalic and atraumatic.  Right Ear: Tympanic membrane normal.  Left Ear: Tympanic membrane normal.  Nose: Nose normal. Right sinus exhibits no maxillary sinus tenderness and no frontal sinus tenderness. Left sinus exhibits no maxillary sinus tenderness and no frontal sinus tenderness.  Mouth/Throat: Uvula is midline, oropharynx is clear and moist and mucous membranes are normal. No oropharyngeal exudate.  Eyes: Conjunctivae and EOM are normal. Pupils are equal, round, and reactive to light. Right eye exhibits no discharge. Left eye exhibits no discharge. No scleral icterus.  Neck: Normal range of motion. Neck supple.  Cardiovascular: Regular rhythm, normal heart sounds and intact distal pulses.   tachycardic  Pulmonary/Chest: Effort normal. She has no wheezes. She exhibits no tenderness.  Rhonchi heard throughout all lung fields, tachypneic.  Abdominal: Soft. She exhibits distension. She exhibits no mass. There is no tenderness. There is no rebound and no guarding.  Musculoskeletal: Normal range of motion. She exhibits no edema or tenderness.  Lymphadenopathy:    She has no cervical adenopathy.  Neurological: She is alert and oriented to person, place, and time.  Skin: Skin is warm and dry.  Nursing note and vitals reviewed.   ED Course  Procedures (including critical care time) Labs Review Labs Reviewed  BASIC METABOLIC PANEL - Abnormal; Notable for the following:    Chloride 100 (*)    Glucose, Bld 43 (*)    BUN 64 (*)     All other components within normal limits  CBC WITH DIFFERENTIAL/PLATELET - Abnormal; Notable for the following:    WBC 18.4 (*)    RBC 5.27 (*)    Hemoglobin 15.5 (*)    Neutro Abs 16.0 (*)    Monocytes Absolute 1.7 (*)    All other components within normal limits  CBG MONITORING, ED - Abnormal; Notable for the following:    Glucose-Capillary 128 (*)    All other components within normal limits  CULTURE, BLOOD (ROUTINE X 2)  CULTURE, BLOOD (ROUTINE X 2)  URINE CULTURE  URINALYSIS, ROUTINE W REFLEX MICROSCOPIC (NOT AT [facility])  COMPREHENSIVE METABOLIC PANEL  LIPASE, BLOOD  I-STAT CG4 LACTIC ACID, ED    Imaging Review Ct Abdomen Pelvis Wo Contrast  01/31/2015  CLINICAL DATA:  Abdominal distension. Abdominal pain. Weakness. Leukocytosis. EXAM: CT ABDOMEN AND PELVIS WITHOUT CONTRAST TECHNIQUE: Multidetector CT imaging of the abdomen and pelvis was performed following the standard protocol without IV contrast. COMPARISON:  Chest CT 08/24/2014 FINDINGS: Subpleural opacities in the right lower lobe likely reflect dependent subsegmental atelectasis and trace pleural fluid or thickening. There is minimal atelectasis or scarring in the left lung base. Coronary artery calcification is noted. Pericardial effusion measures up to 1.5 cm in thickness and overall appears similar in volume to the prior chest CT although with a greater amount of the fluid located posteriorly on the current examination. Small gallstones are noted. No biliary dilatation is seen. The liver, kidneys, and pancreas have an unremarkable unenhanced appearance. A subcentimeter calcification in the spleen is unchanged, as is minimal bilateral adrenal gland thickening. There is the suggestion of mild distal esophageal wall thickening with a small amount of fluid or debris present, incompletely visualized. The appendix is identified in the right lower and central abdomen and is mildly distended and thick walled diffusely, measuring up to  13 mm in diameter. There is mild surrounding inflammatory stranding, and there is a small locule of extraluminal gas immediately inferior to the proximal appendix (series 2, image 69). More inferiorly in the right adnexa is a 4.2 x 3.6 cm gas and fluid collection. There is additional fluid and gas in the left pelvis which appears less organized, measuring approximately 9.6 x 3.9 cm (series 2, image 78). There is moderate dilatation of multiple proximal and mid small bowel loops containing air-fluid levels and measuring up to 5.6 cm in diameter. Gas and fluid are also present an nondilated colon. There is mild sigmoid colon wall thickening which is favored to be secondary to adjacent pelvic inflammation. Bladder is unremarkable. Uterus is present. Extensive atherosclerotic vascular calcification is noted. Lower lumbar facet arthrosis is noted. Subcentimeter retroperitoneal lymph nodes are likely reactive. IMPRESSION: 1. Findings most compatible with ruptured appendicitis with likely developing pelvic abscesses. 2. Moderate small bowel dilatation favored to reflect ileus rather than mechanical obstruction. 3. Cholelithiasis. 4. Unchanged pericardial effusion. 5. Distal esophageal wall thickening, query esophagitis. These results were called by telephone at the time of interpretation on 01/31/2015 at 11:51 am to Dr. Tyrone Nine, who verbally acknowledged these results. Electronically Signed   By: Logan Bores M.D.   On: 01/31/2015 11:58   Dg Chest 2 View  01/31/2015  CLINICAL DATA:  Cough and congestion with increased weakness over the past 5 days. EXAM: CHEST  2 VIEW COMPARISON:  Chest CT 08/24/2014 FINDINGS: Cardiopericardial enlargement appears increased compared to scanogram from previous study, when pericardial effusion was present, but there is rotation to the left. Stable aortic and hilar contours. Poorly defined left diaphragm, likely from cardiac contact, with no pleural effusion seen on lateral imaging. Mild  interstitial coarsening correlating with emphysema on previous CT. There is no edema, consolidation, or pneumothorax. IMPRESSION: 1. Cardiopericardial enlargement with apparent increase from chest CT 08/24/2014 potentially due to rotation. Given a pericardial effusion was present on the comparison CT, consider echocardiogram follow-up. 2. Emphysema without superimposed pneumonia or edema. Electronically Signed   By: Monte Fantasia M.D.   On: 01/31/2015 10:33   I have personally reviewed and evaluated these images and lab results as part of my medical decision-making.   EKG Interpretation   Date/Time:  Wednesday January 31 2015 09:54:51 EDT Ventricular Rate:  115 PR Interval:  125 QRS Duration: 76 QT Interval:  305 QTC Calculation: 422 R Axis:   79 Text Interpretation:  Sinus tachycardia Probable left atrial enlargement  Low voltage, precordial leads No significant change since last tracing  Confirmed by FLOYD MD, DANIEL ([id number]) on 01/31/2015 10:44:44 AM  MDM   Final diagnoses:  Ruptured appendicitis  Sepsis, due to unspecified organism [facility])  Hypoxic    Patient presents to the ED with complaint of flulike symptoms. Endorses fever, chills, chest congestion, SOB, N/V/D, decreased urine, weakness, abdominal pain. Symptoms have worsened throughout the weekend. Upon arrival to ED, pt was tachycardic, afebrile, tachypneic, hypoxic and hypotensive. She was placed on 3L [place]. Exam revealed rhonchi heard throughout all lung fields, RR 20, no respiratory distress, distended abdomen, no abdominal tenderness, no peritoneal signs.   WBC 18.4. Glucose 43, pt given D-50, glucose rechecked and has increased to 128. Lactic acid 1.98. CXR showed cardiopericardial enlargement and emphysema without pneumonia or edema. Sepsis protocol placed. Pt given duoneb. I reexamined patient after treatment, improvement of breath sounds however scattered rhonchi are still noted throughout all lung fields, patient  does not appear to be in any respiratory distress. CT abdomen showed ruptured appendicitis with likely developing pelvic abscesses, moderate small bowel dilatation to reflect likely ileus, cholelithiasis, unchanged pericardial effusion. Patient reexamined and reports that she is having mild abdominal pain. Pt given pain meds and started on IV antibiotics. Discussed findings with patient and plan for admission.  Surgery consulted, I spoke with the PA who reports he will come see the pt in the ED and to have the pt admitted to surgery. Hospitalist consulted, I spoke with Dr. Grandville Silos who reports that due to the pt's current condition and comorbidities he advised I consulted critical care for admission. Consulted critical care, I spoke with Dr. Titus Mould who reports his team will come see the pt in the ED and speak with surgery regarding further management of the pt. After critical care evaluated the pt in the ED, orders were placed for admission.     Nona Dell, PA-C 01/31/15 [place], DO 01/31/15 [phone]

## 2015-01-31 NOTE — ED Notes (Signed)
Pt is aware of urine sample, does not have to go at this time

## 2015-02-01 ENCOUNTER — Other Ambulatory Visit (HOSPITAL_COMMUNITY): Payer: Medicare Other

## 2015-02-01 ENCOUNTER — Inpatient Hospital Stay (HOSPITAL_COMMUNITY): Payer: Medicare Other

## 2015-02-01 DIAGNOSIS — E162 Hypoglycemia, unspecified: Secondary | ICD-10-CM

## 2015-02-01 DIAGNOSIS — K352 Acute appendicitis with generalized peritonitis: Secondary | ICD-10-CM

## 2015-02-01 DIAGNOSIS — Z720 Tobacco use: Secondary | ICD-10-CM

## 2015-02-01 DIAGNOSIS — A419 Sepsis, unspecified organism: Secondary | ICD-10-CM | POA: Diagnosis not present

## 2015-02-01 DIAGNOSIS — J9601 Acute respiratory failure with hypoxia: Secondary | ICD-10-CM | POA: Diagnosis not present

## 2015-02-01 LAB — BASIC METABOLIC PANEL
Anion gap: 6 (ref 5–15)
BUN: 23 mg/dL — ABNORMAL HIGH (ref 6–20)
CALCIUM: 7.3 mg/dL — AB (ref 8.9–10.3)
CO2: 26 mmol/L (ref 22–32)
CREATININE: 0.8 mg/dL (ref 0.44–1.00)
Chloride: 114 mmol/L — ABNORMAL HIGH (ref 101–111)
GLUCOSE: 66 mg/dL (ref 65–99)
Potassium: 3 mmol/L — ABNORMAL LOW (ref 3.5–5.1)
Sodium: 146 mmol/L — ABNORMAL HIGH (ref 135–145)

## 2015-02-01 LAB — BLOOD GAS, ARTERIAL
ACID-BASE DEFICIT: 0.4 mmol/L (ref 0.0–2.0)
Bicarbonate: 22.8 mEq/L (ref 20.0–24.0)
Drawn by: 441261
FIO2: 1
MECHVT: 500 mL
O2 Saturation: 96.8 %
PCO2 ART: 33.9 mmHg — AB (ref 35.0–45.0)
PEEP: 5 cmH2O
PO2 ART: 89.2 mmHg (ref 80.0–100.0)
Patient temperature: 98.6
RATE: 15 resp/min
TCO2: 20.7 mmol/L (ref 0–100)
pH, Arterial: 7.442 (ref 7.350–7.450)

## 2015-02-01 LAB — GLUCOSE, CAPILLARY
GLUCOSE-CAPILLARY: 102 mg/dL — AB (ref 65–99)
GLUCOSE-CAPILLARY: 107 mg/dL — AB (ref 65–99)
GLUCOSE-CAPILLARY: 150 mg/dL — AB (ref 65–99)
GLUCOSE-CAPILLARY: 155 mg/dL — AB (ref 65–99)
GLUCOSE-CAPILLARY: 177 mg/dL — AB (ref 65–99)
GLUCOSE-CAPILLARY: 64 mg/dL — AB (ref 65–99)
GLUCOSE-CAPILLARY: 68 mg/dL (ref 65–99)
Glucose-Capillary: 45 mg/dL — ABNORMAL LOW (ref 65–99)
Glucose-Capillary: 103 mg/dL — ABNORMAL HIGH (ref 65–99)
Glucose-Capillary: 51 mg/dL — ABNORMAL LOW (ref 65–99)
Glucose-Capillary: 67 mg/dL (ref 65–99)
Glucose-Capillary: 81 mg/dL (ref 65–99)
Glucose-Capillary: 59 mg/dL — ABNORMAL LOW (ref 65–99)
Glucose-Capillary: 13 mg/dL — CL (ref 65–99)
Glucose-Capillary: 124 mg/dL — ABNORMAL HIGH (ref 65–99)
Glucose-Capillary: 57 mg/dL — ABNORMAL LOW (ref 65–99)
Glucose-Capillary: 75 mg/dL (ref 65–99)
Glucose-Capillary: 156 mg/dL — ABNORMAL HIGH (ref 65–99)

## 2015-02-01 LAB — COMPREHENSIVE METABOLIC PANEL
ALBUMIN: 2.1 g/dL — AB (ref 3.5–5.0)
ALK PHOS: 49 U/L (ref 38–126)
ALT: 12 U/L — ABNORMAL LOW (ref 14–54)
ANION GAP: 7 (ref 5–15)
AST: 29 U/L (ref 15–41)
BUN: 29 mg/dL — AB (ref 6–20)
CALCIUM: 7.7 mg/dL — AB (ref 8.9–10.3)
CO2: 25 mmol/L (ref 22–32)
CREATININE: 0.69 mg/dL (ref 0.44–1.00)
Chloride: 109 mmol/L (ref 101–111)
GFR calc Af Amer: >60 mL/min (ref >60)
GFR calc non Af Amer: >60 mL/min (ref >60)
GLUCOSE: 132 mg/dL — AB (ref 65–99)
Potassium: 3.6 mmol/L (ref 3.5–5.1)
Sodium: 141 mmol/L (ref 135–145)
Total Bilirubin: 0.5 mg/dL (ref 0.3–1.2)
Total Protein: 5.3 g/dL — ABNORMAL LOW (ref 6.5–8.1)

## 2015-02-01 LAB — URINE CULTURE: Culture: NO GROWTH

## 2015-02-01 LAB — PROCALCITONIN: Procalcitonin: 12.21 ng/mL

## 2015-02-01 LAB — BRAIN NATRIURETIC PEPTIDE: B Natriuretic Peptide: 487.5 pg/mL — ABNORMAL HIGH (ref 0.0–100.0)

## 2015-02-01 LAB — LACTIC ACID, PLASMA
Lactic Acid, Venous: 1.6 mmol/L (ref 0.5–2.0)
Lactic Acid, Venous: 1.4 mmol/L (ref 0.5–2.0)

## 2015-02-01 LAB — CBC
HEMATOCRIT: 32.7 % — AB (ref 36.0–46.0)
HEMOGLOBIN: 10.8 g/dL — AB (ref 12.0–15.0)
MCH: 28.8 pg (ref 26.0–34.0)
MCHC: 33 g/dL (ref 30.0–36.0)
MCV: 87.2 fL (ref 78.0–100.0)
PLATELETS: 329 10*3/uL (ref 150–400)
RBC: 3.75 MIL/uL — AB (ref 3.87–5.11)
RDW: 14.8 % (ref 11.5–15.5)
WBC: 11.6 10*3/uL — AB (ref 4.0–10.5)

## 2015-02-01 LAB — MRSA PCR SCREENING: MRSA BY PCR: NEGATIVE

## 2015-02-01 LAB — TROPONIN I
TROPONIN I: 0.1 ng/mL — AB (ref <0.031)
Troponin I: 0.13 ng/mL — ABNORMAL HIGH (ref <0.031)

## 2015-02-01 LAB — PHOSPHORUS: PHOSPHORUS: 1.9 mg/dL — AB (ref 2.5–4.6)

## 2015-02-01 MED ORDER — SODIUM CHLORIDE 0.9 % IJ SOLN
10.0000 mL | Freq: Two times a day (BID) | INTRAMUSCULAR | Status: DC
  Administered 2015-02-01 – 2015-02-15 (×16): 10 mL

## 2015-02-01 MED ORDER — DEXTROSE 5 % IV SOLN
0.0000 ug/min | INTRAVENOUS | Status: DC
  Administered 2015-02-01 (×2): 200 ug/min via INTRAVENOUS
  Administered 2015-02-02: 30 ug/min via INTRAVENOUS
  Administered 2015-02-02: 250 ug/min via INTRAVENOUS
  Administered 2015-02-03: 125 ug/min via INTRAVENOUS
  Administered 2015-02-03: 65 ug/min via INTRAVENOUS
  Administered 2015-02-04: 40 ug/min via INTRAVENOUS
  Filled 2015-02-01 (×9): qty 4

## 2015-02-01 MED ORDER — SODIUM CHLORIDE 0.9 % IV SOLN
INTRAVENOUS | Status: DC
  Administered 2015-02-01: 10:00:00 via INTRAVENOUS

## 2015-02-01 MED ORDER — PHENYLEPHRINE HCL 10 MG/ML IJ SOLN
0.0000 ug/min | INTRAMUSCULAR | Status: DC
  Administered 2015-02-01: 40 ug/min via INTRAVENOUS
  Filled 2015-02-01: qty 1

## 2015-02-01 MED ORDER — DEXTROSE 50 % IV SOLN
INTRAVENOUS | Status: AC
  Administered 2015-02-01: 50 mL
  Filled 2015-02-01: qty 50

## 2015-02-01 MED ORDER — FENTANYL CITRATE (PF) 100 MCG/2ML IJ SOLN
50.0000 ug | INTRAMUSCULAR | Status: DC | PRN
  Administered 2015-02-01 – 2015-02-02 (×5): 50 ug via INTRAVENOUS
  Filled 2015-02-01 (×5): qty 2

## 2015-02-01 MED ORDER — FAMOTIDINE IN NACL 20-0.9 MG/50ML-% IV SOLN
20.0000 mg | Freq: Two times a day (BID) | INTRAVENOUS | Status: DC
  Administered 2015-02-01 – 2015-02-06 (×12): 20 mg via INTRAVENOUS
  Filled 2015-02-01 (×12): qty 50

## 2015-02-01 MED ORDER — DEXTROSE 50 % IV SOLN
1.0000 | Freq: Once | INTRAVENOUS | Status: AC
  Administered 2015-02-01: 50 mL via INTRAVENOUS

## 2015-02-01 MED ORDER — IMIPENEM-CILASTATIN 500 MG IV SOLR
500.0000 mg | Freq: Four times a day (QID) | INTRAVENOUS | Status: DC
  Administered 2015-02-01 – 2015-02-14 (×52): 500 mg via INTRAVENOUS
  Filled 2015-02-01 (×55): qty 500

## 2015-02-01 MED ORDER — DEXTROSE 10 % IV SOLN
INTRAVENOUS | Status: DC
  Administered 2015-02-01: 10:00:00 via INTRAVENOUS

## 2015-02-01 MED ORDER — SODIUM CHLORIDE 0.9 % IV BOLUS (SEPSIS)
500.0000 mL | Freq: Once | INTRAVENOUS | Status: AC
  Administered 2015-02-01: 500 mL via INTRAVENOUS

## 2015-02-01 MED ORDER — FENTANYL CITRATE (PF) 100 MCG/2ML IJ SOLN
50.0000 ug | INTRAMUSCULAR | Status: DC | PRN
  Administered 2015-02-01 (×2): 50 ug via INTRAVENOUS
  Filled 2015-02-01: qty 2

## 2015-02-01 MED ORDER — DILTIAZEM HCL 100 MG IV SOLR
5.0000 mg/h | INTRAVENOUS | Status: DC
  Administered 2015-02-01: 10 mg/h via INTRAVENOUS

## 2015-02-01 MED ORDER — ANTISEPTIC ORAL RINSE SOLUTION (CORINZ)
7.0000 mL | Freq: Four times a day (QID) | OROMUCOSAL | Status: DC
  Administered 2015-02-01 – 2015-02-15 (×42): 7 mL via OROMUCOSAL

## 2015-02-01 MED ORDER — HEPARIN SODIUM (PORCINE) 5000 UNIT/ML IJ SOLN
5000.0000 [IU] | Freq: Three times a day (TID) | INTRAMUSCULAR | Status: DC
  Administered 2015-02-01 – 2015-02-14 (×40): 5000 [IU] via SUBCUTANEOUS
  Filled 2015-02-01 (×41): qty 1

## 2015-02-01 MED ORDER — FUROSEMIDE 10 MG/ML IJ SOLN
60.0000 mg | Freq: Once | INTRAMUSCULAR | Status: AC
  Administered 2015-02-01: 60 mg via INTRAVENOUS
  Filled 2015-02-01: qty 6

## 2015-02-01 MED ORDER — CHLORHEXIDINE GLUCONATE 0.12% ORAL RINSE (MEDLINE KIT)
15.0000 mL | Freq: Two times a day (BID) | OROMUCOSAL | Status: DC
  Administered 2015-02-01 – 2015-02-09 (×14): 15 mL via OROMUCOSAL

## 2015-02-01 MED ORDER — DEXTROSE 50 % IV SOLN
INTRAVENOUS | Status: AC
  Administered 2015-02-01: 11:00:00
  Filled 2015-02-01: qty 50

## 2015-02-01 MED ORDER — DEXTROSE 50 % IV SOLN
INTRAVENOUS | Status: AC
  Filled 2015-02-01: qty 50

## 2015-02-01 MED ORDER — ACETAMINOPHEN 10 MG/ML IV SOLN
1000.0000 mg | Freq: Four times a day (QID) | INTRAVENOUS | Status: AC
  Administered 2015-02-01 – 2015-02-02 (×4): 1000 mg via INTRAVENOUS
  Filled 2015-02-01 (×5): qty 100

## 2015-02-01 MED ORDER — ALBUTEROL SULFATE (2.5 MG/3ML) 0.083% IN NEBU: 2.5000 mg | INHALATION_SOLUTION | RESPIRATORY_TRACT | Status: DC | PRN

## 2015-02-01 MED ORDER — DILTIAZEM HCL 100 MG IV SOLR
INTRAVENOUS | Status: AC
  Administered 2015-02-01: 10 mg/h
  Filled 2015-02-01: qty 100

## 2015-02-01 MED ORDER — PHENYLEPHRINE HCL 10 MG/ML IJ SOLN
30.0000 ug/min | INTRAVENOUS | Status: DC
  Filled 2015-02-01: qty 1

## 2015-02-01 MED ORDER — SODIUM CHLORIDE 0.9 % IJ SOLN
10.0000 mL | INTRAMUSCULAR | Status: DC | PRN
  Administered 2015-02-07: 20 mL
  Filled 2015-02-01: qty 40

## 2015-02-01 NOTE — Progress Notes (Signed)
Pt's Neosynephrine  drip is changed to quad stength and communicated with RN to change ALARIS setting.  Thanks, Garnet Sierras 02/01/2015

## 2015-02-01 NOTE — Progress Notes (Signed)
Inpatient Diabetes Program Recommendations  AACE/ADA: New Consensus Statement on Inpatient Glycemic Control (2015)  Target Ranges:  Prepandial:   less than 140 mg/dL      Peak postprandial:   less than 180 mg/dL (1-2 hours)      Critically ill patients:  140 - 180 mg/dL    Results for Mercedes Thornton, [name] (MRN [id number]) as of 02/01/2015 10:32  Ref. Range 01/31/2015 12:02 01/31/2015 20:56 01/31/2015 20:58 01/31/2015 21:16 01/31/2015 22:04 01/31/2015 22:23 01/31/2015 23:00 01/31/2015 23:58 02/01/2015 00:50 02/01/2015 01:28 02/01/2015 03:06 02/01/2015 04:43 02/01/2015 05:16 02/01/2015 07:41  Glucose-Capillary Latest Ref Range: 65-99 mg/dL 128 (H) 13 (LL) 13 (LL) 84 65 58 (L) 113 (H) 64 (L) 45 (L) 103 (H) 51 (L) 81 67 59 (L)     Admit with: Ruptured Appendix  History: DM  Home DM Meds: Amaryl 4 mg daily       Metformin 500 mg bid  Current Insulin Orders: Novolog Moderate SSI (0-15 units) Q4 hours     -Multiple episodes of Hypoglycemia noted.  No Insulin given yet this admission.  -Note D10% IVF @ 60cc/hour started at 9:40am today.    MD- Please consider reducing Novolog SSI to Sensitive scale (0-9 units) Q4 hours.  Or may want to discontinue Novolog SSI for now and monitor CBGs Q4 hours.     --Will follow patient during hospitalization--  Wyn Quaker RN, MSN, CDE Diabetes Coordinator Inpatient Glycemic Control Team Team Pager: [phone] (8a-5p)

## 2015-02-01 NOTE — Progress Notes (Signed)
Initial Nutrition Assessment  DOCUMENTATION CODES:   Not applicable  INTERVENTION:  - If pt to be intubated <24 hours, recommend Vital AF 1.2 @ 60 mL/hr to provide (in addition to kcal from Propofol) 1997 kcal (99% estimated needs), 108 grams protein, and 1168 mL free water.  - RD will continue to monitor for needs  NUTRITION DIAGNOSIS:   Inadequate protein intake related to inability to eat as evidenced by NPO status.  GOAL:   Patient will meet greater than or equal to 90% of their needs  MONITOR:   Vent status, Weight trends, Labs, Skin, I & O's  REASON FOR ASSESSMENT:   Ventilator  ASSESSMENT:   69 y/o F, current smoker, with a PMH of COPD, DM, HLD, who presented to [facility] on 10/19 with nausea/vomiting, abdominal pain, chills and productive cough. Found to have a ruptured appendix with developing pelvic abscess. PCCM consulted for admission.   Pt seen for new vent. BMI indicates overweight status. Pt admitted for ruptured appendix and now POD #1  LOA with laparoscopic appendectomy and evacuation of pelvic abscesses.  Patient is currently intubated on ventilator support MV: 11.8 L/min Temp (24hrs), Avg:99.7 F (37.6 C), Min:97.4 F (36.3 C), Max:101.6 F (38.7 C)  Propofol: 10.2 ml/hr (269 kcal)  Pt with mitten restraints in place and awake at time of visit with NGT in place. No family or visitors present to provide PTA information. Per chart review, pt has gained 9 lbs in the past 5 months. No muscle or fat wasting present. Mild edema throughout.  Unable to meet needs. TF recommendations outlined above should they be needed. Medications reviewed. Labs reviewed; CBGs: 13-113 mg/dL, BUN elevated, Ca: 7.7 mg/dL, Phos: 1.9 mg/dL.    Diet Order:  Diet NPO time specified  Skin:  Wound (see comment) (abdominal incision (01/31/15))  Last BM:  PTA  Height:   Ht Readings from Last 1 Encounters:  01/31/15 5\' 10"  (1.778 m)    Weight:   Wt Readings from Last 1  Encounters:  01/31/15 186 lb 15.2 oz (84.8 kg)    Ideal Body Weight:  68.18 kg (kg)  BMI:  Body mass index is 26.82 kg/(m^2).  Estimated Nutritional Needs:   Kcal:  2021  Protein:  102-127 grams  Fluid:  2 L/day  EDUCATION NEEDS:   No education needs identified at this time     Jarome Matin, RD, LDN Inpatient Clinical Dietitian Pager # [phone] After hours/weekend pager # [phone]

## 2015-02-01 NOTE — Progress Notes (Signed)
[facility] Progress Note Patient Name: Mercedes Thornton DOB: [birth year] MRN: [id number]   Date of Service  02/01/2015  HPI/Events of Note  Hypoglycemia - blood glucose = 68.  eICU Interventions  Will increase D10 IV infusion to 125 mL/hour.     Intervention Category Intermediate Interventions: Other:  [name],[name] [name] 02/01/2015, 4:22 PM

## 2015-02-01 NOTE — Progress Notes (Signed)
D50 i amp given per protocol given  At 0800 and 1115 today for CBG of 59 .Rechecked and cbg 124 and 150 fiftteen minutes after each check. Dr [place] Bells notifed.

## 2015-02-01 NOTE — Progress Notes (Signed)
Post op CBG 13. Dr. Oletta Darter notified. 25gms Dextrose 50 pushed, F/U 84. At one hour, CBG 58; another 25gms Dextrose pushed. IV of D5NS hung at 2100.F/U CBG 113 at 2300. Will check hourly CBGs.

## 2015-02-01 NOTE — Progress Notes (Signed)
PULMONARY / CRITICAL CARE MEDICINE   Name: Mercedes Thornton MRN: [id number] DOB: Mar 18, 1946    ADMISSION DATE:  01/31/2015 CONSULTATION DATE:  01/31/15  REFERRING MD :  Dr. Tyrone Nine / EDP   CHIEF COMPLAINT:  Ruptured Appendix   INITIAL PRESENTATION: 69 y/o F, current smoker, with a PMH of COPD, DM, HLD, who presented to [facility] on 10/19 with nausea/vomiting, abdominal pain, chills and productive cough.  Found to have a ruptured appendix with developing pelvic abscess. PCCM consulted for admission.    STUDIES:  10/19 CT ABD / Pelvis >> findings most compatible with ruptured appendicitis with developing pelvic abscess, moderate small bowel dilation concerning for ileus rather than obstruction, cholelithiasis, unchanged pericardial effusion, distal esophageal thickening  SIGNIFICANT EVENTS: 10/19  Admit with ruptured appendix> taken to OR, abscess drained, appendectomy (ruptured, appendolith noted), lysis of adhesions, closed 10/20 failed SBT this morning   SUBJECTIVE: Lots of hypoglycemia, failed SBT this morning on my exam  VITAL SIGNS: Temp:  [97.4 F (36.3 C)-101.6 F (38.7 C)] 100.8 F (38.2 C) (10/20 0800) Pulse Rate:  [80-122] 120 (10/20 0320) Resp:  [15-43] 30 (10/20 0800) BP: (83-132)/(36-71) 113/54 mmHg (10/20 0800) SpO2:  [77 %-98 %] 93 % (10/20 0800) FiO2 (%):  [50 %] 50 % (10/20 0800) Weight:  [78.019 kg (172 lb)-84.8 kg (186 lb 15.2 oz)] 84.8 kg (186 lb 15.2 oz) (10/19 2000)   HEMODYNAMICS:     VENTILATOR SETTINGS: Vent Mode:  [-] PRVC FiO2 (%):  [50 %] 50 % Set Rate:  [15 bmp] 15 bmp Vt Set:  [500 mL] 500 mL PEEP:  [5 cmH20] 5 cmH20 Plateau Pressure:  [18 cmH20-21 cmH20] 18 cmH20   INTAKE / OUTPUT:  Intake/Output Summary (Last 24 hours) at 02/01/15 0821 Last data filed at 02/01/15 0800  Gross per 24 hour  Intake 7782.14 ml  Output   1570 ml  Net 6212.14 ml    PHYSICAL EXAMINATION: General:  Awake on vent, following commands HENT: NCAT, EOMi, ETT in  place PULM: Crackles R lung, shallow breaths supported by vent CV: Tachy, regular, no mgr GI: no bowel sounds, mildly distended, JP drain in place MSK: normal bulk, tone Derm: edema noted legs Neuro: Awake, interactive, follows commands, moves all four ext  LABS:  CBC  Recent Labs Lab 01/31/15 1010 02/01/15 0355  WBC 18.4* 11.6*  HGB 15.5* 10.8*  HCT 44.9 32.7*  PLT 254 329   Coag's No results for input(s): APTT, INR in the last 168 hours.   BMET  Recent Labs Lab 01/31/15 1010 01/31/15 1136 02/01/15 0355  NA 139 139 141  K 3.8 3.2* 3.6  CL 100* 100* 109  CO2 25 27 25   BUN 64* 62* 29*  CREATININE 0.87 1.04* 0.69  GLUCOSE 43* 166* 132*   Electrolytes  Recent Labs Lab 01/31/15 1010 01/31/15 1136 02/01/15 0355  CALCIUM 9.6 9.2 7.7*  PHOS  --   --  1.9*   Sepsis Markers  Recent Labs Lab 01/31/15 1020 01/31/15 1136 01/31/15 1406 02/01/15 0355  LATICACIDVEN 1.98  --  1.91  --   PROCALCITON  --  29.70  --  12.21   ABG  Recent Labs Lab 01/31/15 2137  PHART 7.334*  PCO2ART 47.9*  PO2ART 74.1*     Liver Enzymes  Recent Labs Lab 01/31/15 1136 02/01/15 0355  AST 28 29  ALT 18 12*  ALKPHOS 81 49  BILITOT 1.0 0.5  ALBUMIN 2.8* 2.1*   Cardiac Enzymes No results for input(s): TROPONINI,  PROBNP in the last 168 hours.   Glucose  Recent Labs Lab 02/01/15 0050 02/01/15 0128 02/01/15 0306 02/01/15 0443 02/01/15 0516 02/01/15 0741  GLUCAP 45* 103* 51* 81 67 59*    Imaging  10/20 CXR images personally reviewed> R lung infiltrate vs pleural effusion, low lung volume, ETT in place  ASSESSMENT / PLAN:  PULMONARY OETT n/a A: Tobacco Abuse  Acute Respiratory failure due to volume overload (acute pulmonary edema) in setting of COPD COPD, does not appear to be in exacerbation this morning, unclear baseline severity  P:   Order vent stabilization protocol Continue full vent support this morning Diurese PRN albuterol  Smoking  cessation counseling after extubation   CARDIOVASCULAR PICC ordered 10/20 A:  Sepsis without septic shock, she is volume replete Pericardial Effusion - hx of, noted on CT Chest 08/2014, appears enlarged on admit CXR but CT comparison unchanged HLD  P:  Check lactic acid ICU monitoring  Change fluids to D10 for hypoglycemia + normal saline Place PICC Check echocardiogram diurese Assess EKG in am  Hold home lisinopril, lovastatin  RENAL A:   Hypokalemia  P:   Monitor BMET and UOP Replace electrolytes as needed  GASTROINTESTINAL A:   Nausea / Vomiting Ruptured Appendix with abscess > s/p appendectomy, lysis of adhesions, washout, and drain placement Cholelithiasis  P:   Post op care per surgery Nutrition per surgery  Add H2 blocker for stress ulcer prophylaxis See ID   HEMATOLOGIC A:   Leukocytosis - in setting of intra-abdominal process  P:  See ID  Monitor CBC  Heparin sub cutaneously for DVT prophylaxis   INFECTIOUS A:   Sepsis due to ruptured Appendix with pelvic abscess P:   BCx2 10/19 >>  UC 10/19 >>   Cipro, start date 10/19, 1 dose Flagyl, start date 10/19, 1 dose  Imipenem 10/19 >  Vanc 10/19 > likely stop tomorrow if cultures negative  Follow cultures PCT protocol   ENDOCRINE A:   Hypoglycemia again DM II   P:   CBG Q4 while NPO  Start D10 this morning Hold home metformin, glimepiride  NEUROLOGIC A:   Pain post op Bipolar Disorder  Anxiety  P:   RASS goal: -1 Propofol drip Prn fentanyl PRN ativan for anxiety    FAMILY  - Updates: Sister Drenda Freeze) updated on patients status  - Inter-disciplinary family meet or Palliative Care meeting due by:  10/26   My CC time 40 minutes today  Roselie Awkward, MD [place] PCCM Pager: [phone] Cell: [phone] After 3pm or if no response, call [phone]

## 2015-02-01 NOTE — Op Note (Signed)
NAMECHARLETTE, HENNINGS NO.:  [id number]  MEDICAL RECORD NO.:  [id number]  LOCATION:  64                         FACILITY:  [facility]  PHYSICIAN:  Fenton Malling. Lucia Gaskins, M.D.  DATE OF BIRTH:  [birth year]  DATE OF PROCEDURE:  01/31/2015                              OPERATIVE REPORT   PREOPERATIVE DIAGNOSIS:  Perforated appendicitis.  POSTOPERATIVE DIAGNOSIS:  Perforated appendicitis with extensive adhesions due to prior intraabdominal surgery.  PROCEDURE:  Lysis of adhesions for approximately 40 minutes with laparoscopic appendectomy and evacuation of pelvic abscesses.  SURGEON:  Fenton Malling. Lucia Gaskins, M.D.  ANESTHESIA:  General endotracheal supervised by Dr. Leanord Hawking.  ESTIMATED BLOOD LOSS:  Minimal.  DRAINS LEFT IN:  A 19-French Blake drain in the pelvis, local was 30 mL of 1% Xylocaine with epinephrine.  COMPLICATIONS:  None.  INDICATION FOR PROCEDURE:  Ms. Brandner is a 69 year old African American female, whose primary care doctor is Dr. Antonietta Jewel.  She presented with worsening abdominal pain over the last 3-4 days with a CT scan evidence of probable appendicitis with perforation.  She has poor lung function, Critical Care Medicine is involved in her care, but I thought it was best that she go to the operating room, try to evacuate these abscesses and better identify her anatomy regarding her appendix or whether there is another cause for these abscesses.  OPERATIVE NOTE:  The patient was taken to Room #6 at [facility] operating room, where she underwent general anesthesia.  Her left arm was tucked.  Her right arm was out.  Her Foley catheter was placed.  She is already on Cipro and Flagyl.  Foley catheter in place.  Her abdomen was prepped with ChloraPrep and sterilely draped.  A time-out was held and a surgical checklist run.  An infraumbilical incision was made with sharp dissection carried down the abdominal cavity.  She had adhesions along the whole anterior  abdominal wall.  She has only had 2 prior C-section operations according to her.  I placed 5 mm in the left lower quadrant, 5 mm in the left upper quadrant, and then a 5 mm to the right mid abdomen.  I spent about 40 minutes doing enterolysis of adhesions, to get to her anatomy.  I explored her pelvis and broke into the 2 abscesses identified on CAT scan.  I suctioned and irrigated these out.  She also had interloop abscess, which I also broke up as best I could.  She also had the adhesions which were old from her prior surgery  I then was able to identify the appendix, which was stuck along the pelvic brim going to the left side.  The appendix had blown out about 2 cm from the base and there was an appendicolith lying in the area where the appendix had eroded.  I removed this appendicolith separately.  I then was able to free up the appendix, took down the mesentery of the appendix with a Harmonic Scalpel, dissected to the base of the appendix where I used a blue load of the EndoGIA 45 stapler across the base.  I put the appendix on the EndoCatch bag and delivered through the umbilicus.  I spent the remainder of the time of surgery irrigating out the abdomen.  I used a total of 4 L of saline irrigating the pelvis, Right and left cutters, over the liver, and tried to get between the interloop abscesses as best as I could.  I then brought out a 19-French Blake drain through a right lower quadrant to 5-mm trocar site and placed this down behind the uterus in the pelvis.  I then removed the trocars in turn.  I closed the umbilical port with a 0 Vicryl suture x2.  I infiltrated 30 mL of 0.25% Marcaine with epinephrine at the trocar sites and then closed the skin each trocar site with a 5-0 Monocryl suture, painted with LiquiBand.  The patient tolerated the operation well though she remains to be seen whether she will be extubated.  She will go to the ICU for postop care.     Fenton Malling. Lucia Gaskins, M.D., [facility],  Scribe for Epic   DHN/MEDQ  D:  01/31/2015  T:  01/31/2015  Job:  [id number]  cc:   Antonietta Jewel, MD Fax: [phone]

## 2015-02-01 NOTE — Progress Notes (Signed)
Pt in atrial fib rate 140-150 .Mercedes Thornton notifed. Stat EKG and PCXRdone. Labs drawn . 250 CC NS bolus given.

## 2015-02-01 NOTE — Progress Notes (Signed)
Elink updated on pt status BP/CBG issues. Orders received.  Also notified regarding hypokalemia.  No orders at present.  Will continue to monitor.

## 2015-02-01 NOTE — Progress Notes (Signed)
Elink notified about pt elevated temp and CBGs remaining low.  No orders received. Will continue to monitor.

## 2015-02-01 NOTE — Progress Notes (Signed)
[facility] Progress Note Patient Name: Posie Lillibridge DOB: 05/12/1945 MRN: [id number]   Date of Service  02/01/2015  HPI/Events of Note  Multiple issues: 1. Transient episodes of SBP in 80's (CVP = 16), 2. Blood glucose = 177.   eICU Interventions  Will order: 1. Decrease D10 IV infusion from 125 to 100 mL/hour. 2. Wean Cardizem and Propofol IV infusions as tolerated.     Intervention Category Intermediate Interventions: Hyperglycemia - evaluation and treatment;Hypotension - evaluation and management  Brenetta Penny [name] 02/01/2015, 10:26 PM

## 2015-02-01 NOTE — Progress Notes (Signed)
Converted to SR; Cardizem reduced to 5mg /hr.

## 2015-02-01 NOTE — Progress Notes (Signed)
Peripherally Inserted Central Catheter/Midline Placement  The IV Nurse has discussed with the patient and/or persons authorized to consent for the patient, the purpose of this procedure and the potential benefits and risks involved with this procedure.  The benefits include less needle sticks, lab draws from the catheter and patient may be discharged home with the catheter.  Risks include, but not limited to, infection, bleeding, blood clot (thrombus formation), and puncture of an artery; nerve damage and irregular heat beat.  Alternatives to this procedure were also discussed.  PICC/Midline Placement Documentation      Telephone consent  Synthia Innocent 02/01/2015, 2:20 PM

## 2015-02-01 NOTE — Progress Notes (Signed)
Brief pharmacy antibiotic note  Renal function improved, CrCl > 67mls/min  Plan: Adjust primaxin to 500mg  IV q6h Continue fluconazole 400mg  IV q24h Continue vanc 1250mg  IV q24h Continue to follow renal function, cultures, clinical course  Dolly Rias RPh 02/01/2015, 11:48 AM Pager [phone]

## 2015-02-01 NOTE — Progress Notes (Signed)
S:  Called to bedside to assess for atrial fibrillation with RVR and hypotension   O:  BP 58/28, HR 140's-160's, atrial fibrillation with RVR, sats 91% on 60%, Temp 100.8  (tmax 101.6)  General: wdwn adult female lying in bed, does not appear distressed Neuro: awake, alert, nods yes / no to questions, follows commands CV: s1s2 irr irr, monitor with AF w RVR PULM: even/non-labored, lungs bilaterally clear  Extremities: warm/dry, no edema    A:  Atrial Fibrillation with RVR Hypotension  Recent PICC Placement - r/o malposition of PICC into atria Desaturation - sats dropped into low 90's on 60% during acute episode   P:  Assess STAT CXR to confirm placement of PICC line Now BMP, lactic acid, troponin, EKG (personally reviewed), BNP Assess ABG  Assess CVP Q4 Cardizem gtt without bolus given hypotension  Transiently increase O2 to 100%, wean back down for sats > 90% Neosynephrine gtt for MAP >65 if remains hypotensive    Additional CC Time: 45 mintues  Noe Gens, NP-C [facility] Pgr: [phone] or if no answer [phone] 02/01/2015, 3:21 PM

## 2015-02-01 NOTE — Progress Notes (Signed)
1 Day Post-Op  Subjective: Awake on vent, still has fever, up to 100.8 now.  Chest is clear anterior, she has mittens on for her confusion earlier.    Objective: Vital signs in last 24 hours: Temp:  [97.4 F (36.3 C)-101.6 F (38.7 C)] 101.6 F (38.7 C) (10/20 0400) Pulse Rate:  [80-122] 120 (10/20 0320) Resp:  [15-43] 26 (10/20 0500) BP: (83-132)/(36-71) 89/45 mmHg (10/20 0500) SpO2:  [77 %-98 %] 95 % (10/20 0500) FiO2 (%):  [50 %] 50 % (10/20 0320) Weight:  [78.019 kg (172 lb)-84.8 kg (186 lb 15.2 oz)] 84.8 kg (186 lb 15.2 oz) (10/19 2000)  TM 101.6 Hypotensive this AM, BP up some now. WBC 11.6 Albumin 2.1 120 from the NG recorded, not much coming out now either. Intake/Output from previous day: 10/19 0701 - 10/20 0700 In: 6575 [I.V.:4325; IV Piggyback:1250] Out: 1180 [Urine:1130; Drains:50] Intake/Output this shift:    General appearance: alert, cooperative and on Vent,  Resp: clear to auscultation bilaterally and anterior on vent GI: she is somewhat distended, No BS, dressing is dry, drain is showing verylittle, and it is clear serous fluid currently.  Lab Results:   Recent Labs  01/31/15 1010 02/01/15 0355  WBC 18.4* 11.6*  HGB 15.5* 10.8*  HCT 44.9 32.7*  PLT 254 329    BMET  Recent Labs  01/31/15 1136 02/01/15 0355  NA 139 141  K 3.2* 3.6  CL 100* 109  CO2 27 25  GLUCOSE 166* 132*  BUN 62* 29*  CREATININE 1.04* 0.69  CALCIUM 9.2 7.7*   PT/INR No results for input(s): LABPROT, INR in the last 72 hours.   Recent Labs Lab 01/31/15 1136 02/01/15 0355  AST 28 29  ALT 18 12*  ALKPHOS 81 49  BILITOT 1.0 0.5  PROT 7.5 5.3*  ALBUMIN 2.8* 2.1*     Lipase     Component Value Date/Time   LIPASE 11* 01/31/2015 1136     Studies/Results: Ct Abdomen Pelvis Wo Contrast  01/31/2015  CLINICAL DATA:  Abdominal distension. Abdominal pain. Weakness. Leukocytosis. EXAM: CT ABDOMEN AND PELVIS WITHOUT CONTRAST TECHNIQUE: Multidetector CT imaging of  the abdomen and pelvis was performed following the standard protocol without IV contrast. COMPARISON:  Chest CT 08/24/2014 FINDINGS: Subpleural opacities in the right lower lobe likely reflect dependent subsegmental atelectasis and trace pleural fluid or thickening. There is minimal atelectasis or scarring in the left lung base. Coronary artery calcification is noted. Pericardial effusion measures up to 1.5 cm in thickness and overall appears similar in volume to the prior chest CT although with a greater amount of the fluid located posteriorly on the current examination. Small gallstones are noted. No biliary dilatation is seen. The liver, kidneys, and pancreas have an unremarkable unenhanced appearance. A subcentimeter calcification in the spleen is unchanged, as is minimal bilateral adrenal gland thickening. There is the suggestion of mild distal esophageal wall thickening with a small amount of fluid or debris present, incompletely visualized. The appendix is identified in the right lower and central abdomen and is mildly distended and thick walled diffusely, measuring up to 13 mm in diameter. There is mild surrounding inflammatory stranding, and there is a small locule of extraluminal gas immediately inferior to the proximal appendix (series 2, image 69). More inferiorly in the right adnexa is a 4.2 x 3.6 cm gas and fluid collection. There is additional fluid and gas in the left pelvis which appears less organized, measuring approximately 9.6 x 3.9 cm (series 2, image  78). There is moderate dilatation of multiple proximal and mid small bowel loops containing air-fluid levels and measuring up to 5.6 cm in diameter. Gas and fluid are also present an nondilated colon. There is mild sigmoid colon wall thickening which is favored to be secondary to adjacent pelvic inflammation. Bladder is unremarkable. Uterus is present. Extensive atherosclerotic vascular calcification is noted. Lower lumbar facet arthrosis is  noted. Subcentimeter retroperitoneal lymph nodes are likely reactive. IMPRESSION: 1. Findings most compatible with ruptured appendicitis with likely developing pelvic abscesses. 2. Moderate small bowel dilatation favored to reflect ileus rather than mechanical obstruction. 3. Cholelithiasis. 4. Unchanged pericardial effusion. 5. Distal esophageal wall thickening, query esophagitis. These results were called by telephone at the time of interpretation on 01/31/2015 at 11:51 am to Dr. Tyrone Nine, who verbally acknowledged these results. Electronically Signed   By: Logan Bores M.D.   On: 01/31/2015 11:58   Dg Chest 2 View  01/31/2015  CLINICAL DATA:  Cough and congestion with increased weakness over the past 5 days. EXAM: CHEST  2 VIEW COMPARISON:  Chest CT 08/24/2014 FINDINGS: Cardiopericardial enlargement appears increased compared to scanogram from previous study, when pericardial effusion was present, but there is rotation to the left. Stable aortic and hilar contours. Poorly defined left diaphragm, likely from cardiac contact, with no pleural effusion seen on lateral imaging. Mild interstitial coarsening correlating with emphysema on previous CT. There is no edema, consolidation, or pneumothorax. IMPRESSION: 1. Cardiopericardial enlargement with apparent increase from chest CT 08/24/2014 potentially due to rotation. Given a pericardial effusion was present on the comparison CT, consider echocardiogram follow-up. 2. Emphysema without superimposed pneumonia or edema. Electronically Signed   By: Monte Fantasia M.D.   On: 01/31/2015 10:33   Dg Chest Port 1 View  01/31/2015  CLINICAL DATA:  Endotracheal tube placement. EXAM: PORTABLE CHEST 1 VIEW COMPARISON:  01/31/2015 at 1016 hours. FINDINGS: Endotracheal tube terminates approximately 2.9 cm above the carina. Nasogastric tube is followed into the stomach with the side port in the region of the gastroesophageal junction. There is prominence of the AP window and  left hilum. Consolidation in the lingula obscures the left heart border. There is interstitial prominence in the right hemi thorax. IMPRESSION: 1. Endotracheal tube is in satisfactory position. 2. Prominence of the AP window and left hilum, of uncertain etiology. New consolidation in the lingula. Consider further evaluation with CT chest with contrast, as clinically indicated. 3. Progressive interstitial prominence in the right lung, possibly due to edema. Electronically Signed   By: Lorin Picket M.D.   On: 01/31/2015 20:55    Medications: . antiseptic oral rinse  7 mL Mouth Rinse QID  . chlorhexidine gluconate  15 mL Mouth Rinse BID  . fluconazole (DIFLUCAN) IV  400 mg Intravenous Q24H  . heparin  5,000 Units Subcutaneous 3 times per day  . imipenem-cilastatin  500 mg Intravenous 3 times per day  . insulin aspart  0-15 Units Subcutaneous 6 times per day  . pantoprazole (PROTONIX) IV  40 mg Intravenous QHS  . vancomycin  1,250 mg Intravenous Q24H    Assessment/Plan Perforated appendicitis with intraabdominal abscess/extensive intraabdominal adhesions   Lysis of adhesions for approximately 30 minutes with laparoscopic appendectomy and evacuation of pelvic abscesses,01/31/15, Dr. Alphonsa Overall.  POD 1  Sepsis  Hypotension Tachypnea with some decreased O2 saturation  Pericardial effusion COPD/ongoing tobacco use/mediastinal adenopathy/Chronic DOE Chronic leukocytosis  AODM Bipolar/depression Hyperlipidemia Antibiotics:  Diflucan/Vancomycin/Imipenem-cilastain day 2 DVT:  SCD  Plan:  Currently she is on  the vent, still has fever, multiple low glucose levels last PM.  I will add some IV tylenol, in hopes this will help with fever and pain while trying to get her off the Vent.  Continue antibiotics.  I will defer to CCM for VENT and fluid management.    Currently she has daily labs.  She is 6 liters positive for the admission, with stable BP and urine output this AM.   I think we could  start Lovenox later today to add to DVT prophylaxis.   LOS: 1 day    JENNINGS,WILLARD 02/01/2015  Agree with above.  Alphonsa Overall, MD, [facility] Pager: [phone] Office phone:  [phone]

## 2015-02-02 ENCOUNTER — Inpatient Hospital Stay (HOSPITAL_COMMUNITY): Payer: Medicare Other

## 2015-02-02 ENCOUNTER — Encounter (HOSPITAL_COMMUNITY): Payer: Self-pay | Admitting: Surgery

## 2015-02-02 DIAGNOSIS — R079 Chest pain, unspecified: Secondary | ICD-10-CM

## 2015-02-02 DIAGNOSIS — J9601 Acute respiratory failure with hypoxia: Secondary | ICD-10-CM | POA: Diagnosis not present

## 2015-02-02 LAB — BASIC METABOLIC PANEL
Anion gap: 10 (ref 5–15)
BUN: 12 mg/dL (ref 6–20)
CALCIUM: 7.4 mg/dL — AB (ref 8.9–10.3)
CO2: 23 mmol/L (ref 22–32)
CREATININE: 0.63 mg/dL (ref 0.44–1.00)
Chloride: 104 mmol/L (ref 101–111)
GLUCOSE: 218 mg/dL — AB (ref 65–99)
Potassium: 3.4 mmol/L — ABNORMAL LOW (ref 3.5–5.1)
Sodium: 137 mmol/L (ref 135–145)

## 2015-02-02 LAB — GLUCOSE, CAPILLARY
GLUCOSE-CAPILLARY: 176 mg/dL — AB (ref 65–99)
GLUCOSE-CAPILLARY: 186 mg/dL — AB (ref 65–99)
GLUCOSE-CAPILLARY: 212 mg/dL — AB (ref 65–99)
GLUCOSE-CAPILLARY: 220 mg/dL — AB (ref 65–99)
GLUCOSE-CAPILLARY: 223 mg/dL — AB (ref 65–99)
Glucose-Capillary: 208 mg/dL — ABNORMAL HIGH (ref 65–99)
Glucose-Capillary: 167 mg/dL — ABNORMAL HIGH (ref 65–99)
Glucose-Capillary: 203 mg/dL — ABNORMAL HIGH (ref 65–99)
Glucose-Capillary: 160 mg/dL — ABNORMAL HIGH (ref 65–99)
Glucose-Capillary: 101 mg/dL — ABNORMAL HIGH (ref 65–99)

## 2015-02-02 LAB — HEMOGLOBIN A1C
Hgb A1c MFr Bld: 7.5 % — ABNORMAL HIGH (ref 4.8–5.6)
MEAN PLASMA GLUCOSE: 169 mg/dL

## 2015-02-02 LAB — CBC WITH DIFFERENTIAL/PLATELET
BAND NEUTROPHILS: 0 %
BASOS ABS: 0 10*3/uL (ref 0.0–0.1)
BASOS PCT: 0 %
Blasts: 0 %
EOS ABS: 0.3 10*3/uL (ref 0.0–0.7)
Eosinophils Relative: 1 %
HCT: 38.1 % (ref 36.0–46.0)
HEMOGLOBIN: 12 g/dL (ref 12.0–15.0)
Lymphocytes Relative: 9 %
Lymphs Abs: 2.4 10*3/uL (ref 0.7–4.0)
MCH: 28.2 pg (ref 26.0–34.0)
MCHC: 31.5 g/dL (ref 30.0–36.0)
MCV: 89.6 fL (ref 78.0–100.0)
MONO ABS: 2.4 10*3/uL — AB (ref 0.1–1.0)
MYELOCYTES: 0 %
Metamyelocytes Relative: 0 %
Monocytes Relative: 9 %
Neutro Abs: 21.3 10*3/uL — ABNORMAL HIGH (ref 1.7–7.7)
Neutrophils Relative %: 81 %
Other: 0 %
PROMYELOCYTES ABS: 0 %
Platelets: 392 10*3/uL (ref 150–400)
RBC: 4.25 MIL/uL (ref 3.87–5.11)
RDW: 15.8 % — ABNORMAL HIGH (ref 11.5–15.5)
WBC: 26.4 10*3/uL — ABNORMAL HIGH (ref 4.0–10.5)
nRBC: 0 /100 WBC

## 2015-02-02 LAB — CG4 I-STAT (LACTIC ACID): LACTIC ACID, VENOUS: 1.22 mmol/L (ref 0.5–2.0)

## 2015-02-02 LAB — TROPONIN I: TROPONIN I: 0.08 ng/mL — AB (ref <0.031)

## 2015-02-02 LAB — PROCALCITONIN: PROCALCITONIN: 5.23 ng/mL

## 2015-02-02 MED ORDER — INSULIN ASPART 100 UNIT/ML ~~LOC~~ SOLN
0.0000 [IU] | SUBCUTANEOUS | Status: DC
  Administered 2015-02-02: 2 [IU] via SUBCUTANEOUS
  Administered 2015-02-03: 1 [IU] via SUBCUTANEOUS
  Administered 2015-02-04 (×2): 2 [IU] via SUBCUTANEOUS
  Administered 2015-02-04: 1 [IU] via SUBCUTANEOUS
  Administered 2015-02-04: 2 [IU] via SUBCUTANEOUS
  Administered 2015-02-04: 1 [IU] via SUBCUTANEOUS
  Administered 2015-02-04: 2 [IU] via SUBCUTANEOUS
  Administered 2015-02-05: 3 [IU] via SUBCUTANEOUS
  Administered 2015-02-05 (×2): 2 [IU] via SUBCUTANEOUS
  Administered 2015-02-05: 1 [IU] via SUBCUTANEOUS
  Administered 2015-02-05 – 2015-02-06 (×2): 2 [IU] via SUBCUTANEOUS
  Administered 2015-02-06 (×2): 1 [IU] via SUBCUTANEOUS
  Administered 2015-02-06: 2 [IU] via SUBCUTANEOUS
  Administered 2015-02-06: 1 [IU] via SUBCUTANEOUS
  Administered 2015-02-06 – 2015-02-07 (×2): 2 [IU] via SUBCUTANEOUS
  Administered 2015-02-07 (×3): 5 [IU] via SUBCUTANEOUS
  Administered 2015-02-07: 1 [IU] via SUBCUTANEOUS
  Administered 2015-02-07: 3 [IU] via SUBCUTANEOUS
  Administered 2015-02-07: 2 [IU] via SUBCUTANEOUS
  Administered 2015-02-08: 1 [IU] via SUBCUTANEOUS
  Administered 2015-02-08 (×2): 2 [IU] via SUBCUTANEOUS
  Administered 2015-02-08: 1 [IU] via SUBCUTANEOUS
  Administered 2015-02-08: 2 [IU] via SUBCUTANEOUS
  Administered 2015-02-08: 3 [IU] via SUBCUTANEOUS
  Administered 2015-02-09: 1 [IU] via SUBCUTANEOUS
  Administered 2015-02-09: 2 [IU] via SUBCUTANEOUS
  Administered 2015-02-09 (×2): 3 [IU] via SUBCUTANEOUS
  Administered 2015-02-09: 2 [IU] via SUBCUTANEOUS
  Administered 2015-02-09: 1 [IU] via SUBCUTANEOUS
  Administered 2015-02-10 (×2): 3 [IU] via SUBCUTANEOUS
  Administered 2015-02-10: 2 [IU] via SUBCUTANEOUS
  Administered 2015-02-10: 1 [IU] via SUBCUTANEOUS
  Administered 2015-02-10: 3 [IU] via SUBCUTANEOUS
  Administered 2015-02-11: 2 [IU] via SUBCUTANEOUS
  Administered 2015-02-11: 3 [IU] via SUBCUTANEOUS
  Administered 2015-02-11: 1 [IU] via SUBCUTANEOUS
  Administered 2015-02-11: 2 [IU] via SUBCUTANEOUS

## 2015-02-02 MED ORDER — POTASSIUM CHLORIDE 20 MEQ/15ML (10%) PO SOLN
40.0000 meq | ORAL | Status: AC
  Administered 2015-02-02 (×2): 40 meq
  Filled 2015-02-02 (×2): qty 30

## 2015-02-02 MED ORDER — INSULIN ASPART 100 UNIT/ML ~~LOC~~ SOLN: 0.0000 [IU] | Freq: Three times a day (TID) | SUBCUTANEOUS | Status: DC

## 2015-02-02 MED ORDER — INSULIN ASPART 100 UNIT/ML ~~LOC~~ SOLN
0.0000 [IU] | Freq: Three times a day (TID) | SUBCUTANEOUS | Status: DC
  Administered 2015-02-02: 3 [IU] via SUBCUTANEOUS

## 2015-02-02 MED ORDER — SODIUM CHLORIDE 0.9 % IV SOLN
25.0000 ug/h | INTRAVENOUS | Status: DC
  Administered 2015-02-02: 300 ug/h via INTRAVENOUS
  Administered 2015-02-03 – 2015-02-04 (×2): 100 ug/h via INTRAVENOUS
  Filled 2015-02-02 (×5): qty 50

## 2015-02-02 MED ORDER — METOPROLOL TARTRATE 1 MG/ML IV SOLN
5.0000 mg | INTRAVENOUS | Status: DC | PRN
  Administered 2015-02-02 – 2015-02-04 (×3): 5 mg via INTRAVENOUS
  Filled 2015-02-02 (×3): qty 5

## 2015-02-02 MED ORDER — SODIUM CHLORIDE 0.9 % IV SOLN
25.0000 ug/h | INTRAVENOUS | Status: DC
  Administered 2015-02-02: 50 ug/h via INTRAVENOUS
  Filled 2015-02-02: qty 50

## 2015-02-02 MED ORDER — DEXMEDETOMIDINE HCL IN NACL 200 MCG/50ML IV SOLN
0.0000 ug/kg/h | INTRAVENOUS | Status: AC
  Administered 2015-02-02: 0.4 ug/kg/h via INTRAVENOUS
  Administered 2015-02-03: 0.502 ug/kg/h via INTRAVENOUS
  Administered 2015-02-03: 0.4 ug/kg/h via INTRAVENOUS
  Administered 2015-02-03 (×2): 0.5 ug/kg/h via INTRAVENOUS
  Administered 2015-02-04 (×3): 0.4 ug/kg/h via INTRAVENOUS
  Filled 2015-02-02 (×9): qty 50

## 2015-02-02 MED ORDER — MIDAZOLAM HCL 2 MG/2ML IJ SOLN
1.0000 mg | INTRAMUSCULAR | Status: DC | PRN
  Administered 2015-02-02: 1 mg via INTRAVENOUS
  Filled 2015-02-02: qty 2

## 2015-02-02 MED ORDER — FUROSEMIDE 10 MG/ML IJ SOLN
60.0000 mg | Freq: Four times a day (QID) | INTRAMUSCULAR | Status: AC
  Administered 2015-02-02 (×2): 60 mg via INTRAVENOUS
  Filled 2015-02-02 (×2): qty 6

## 2015-02-02 MED ORDER — VITAMINS A & D EX OINT
TOPICAL_OINTMENT | CUTANEOUS | Status: AC
  Administered 2015-02-02: 14:00:00
  Filled 2015-02-02: qty 5

## 2015-02-02 MED ORDER — CLONAZEPAM 0.1 MG/ML ORAL SUSPENSION: 1.0000 mg | Freq: Two times a day (BID) | ORAL | Status: DC

## 2015-02-02 MED ORDER — VANCOMYCIN HCL IN DEXTROSE 1-5 GM/200ML-% IV SOLN
1000.0000 mg | Freq: Two times a day (BID) | INTRAVENOUS | Status: DC
Start: 2015-02-02 — End: 2015-02-02
  Administered 2015-02-02: 1000 mg via INTRAVENOUS
  Filled 2015-02-02: qty 200

## 2015-02-02 MED ORDER — CLONAZEPAM 0.5 MG PO TBDP
1.0000 mg | ORAL_TABLET | Freq: Two times a day (BID) | ORAL | Status: DC
  Administered 2015-02-02 – 2015-02-10 (×18): 1 mg via ORAL
  Filled 2015-02-02 (×18): qty 2

## 2015-02-02 MED ORDER — IPRATROPIUM-ALBUTEROL 0.5-2.5 (3) MG/3ML IN SOLN
3.0000 mL | Freq: Four times a day (QID) | RESPIRATORY_TRACT | Status: DC
  Administered 2015-02-02 – 2015-02-05 (×14): 3 mL via RESPIRATORY_TRACT
  Filled 2015-02-02 (×14): qty 3

## 2015-02-02 MED ORDER — FENTANYL BOLUS VIA INFUSION
25.0000 ug | INTRAVENOUS | Status: DC | PRN
  Filled 2015-02-02: qty 25

## 2015-02-02 MED ORDER — CHLORHEXIDINE GLUCONATE 0.12 % MT SOLN
OROMUCOSAL | Status: AC
  Administered 2015-02-02: 15 mL via OROMUCOSAL
  Filled 2015-02-02: qty 15

## 2015-02-02 NOTE — Progress Notes (Signed)
ANTIBIOTIC CONSULT NOTE - follow-up  Pharmacy Consult for imipenem/vancomycin/fluconazole Indication: Rupture appendix with pelvic abscesses  Allergies  Allergen Reactions  . Penicillins Hives and Swelling    Has patient had a PCN reaction causing immediate rash, facial/tongue/throat swelling, SOB or lightheadedness with hypotension: Yes Has patient had a PCN reaction causing severe rash involving mucus membranes or skin necrosis: Yes Has patient had a PCN reaction that required hospitalization Yes- hospital for 7 days Has patient had a PCN reaction occurring within the last 10 years: No If all of the above answers are "NO", then may proceed with Cephalosporin use.     Patient Measurements: Height: 5\' 10"  (177.8 cm) Weight: 196 lb 13.9 oz (89.3 kg) IBW/kg (Calculated) : 68.5 Adjusted Body Weight:   Vital Signs: Temp: 100.3 F (37.9 C) (10/21 0400) Temp Source: Oral (10/21 0400) BP: 96/56 mmHg (10/21 0904) Pulse Rate: 87 (10/21 0904) Intake/Output from previous day: 10/20 0701 - 10/21 0700 In: 5077.7 [I.V.:4017.7; NG/GT:50; IV Piggyback:1000] Out: 2665 [Urine:2295; Emesis/NG output:350; Drains:20] Intake/Output from this shift:    Labs:  Recent Labs  01/31/15 1010  02/01/15 0355 02/01/15 1530 02/02/15 0610  WBC 18.4*  --  11.6*  --  26.4*  HGB 15.5*  --  10.8*  --  12.0  PLT 254  --  329  --  392  CREATININE 0.87  < > 0.69 0.80 0.63  < > = values in this interval not displayed. Estimated Creatinine Clearance: 81.6 mL/min (by C-G formula based on Cr of 0.63). No results for input(s): VANCOTROUGH, VANCOPEAK, VANCORANDOM, GENTTROUGH, GENTPEAK, GENTRANDOM, TOBRATROUGH, TOBRAPEAK, TOBRARND, AMIKACINPEAK, AMIKACINTROU, AMIKACIN in the last 72 hours.   Microbiology: Recent Results (from the past 720 hour(s))  Blood Culture (routine x 2)     Status: None (Preliminary result)   Collection Time: 01/31/15 11:50 AM  Result Value Ref Range Status   Specimen Description BLOOD  LEFT ANTECUBITAL  Final   Special Requests BOTTLES DRAWN AEROBIC AND ANAEROBIC 5CC  Final   Culture   Final    NO GROWTH < 24 HOURS Performed at [facility]    Report Status PENDING  Incomplete  Blood Culture (routine x 2)     Status: None (Preliminary result)   Collection Time: 01/31/15 12:02 PM  Result Value Ref Range Status   Specimen Description BLOOD LEFT HAND  Final   Special Requests IN PEDIATRIC BOTTLE [place]  Final   Culture   Final    NO GROWTH < 24 HOURS Performed at [facility]    Report Status PENDING  Incomplete  Urine culture     Status: None   Collection Time: 01/31/15  2:00 PM  Result Value Ref Range Status   Specimen Description URINE, CATHETERIZED  Final   Special Requests NONE  Final   Culture   Final    NO GROWTH 1 DAY Performed at [facility]    Report Status 02/01/2015 FINAL  Final  MRSA PCR Screening     Status: None   Collection Time: 02/01/15 12:32 AM  Result Value Ref Range Status   MRSA by PCR NEGATIVE NEGATIVE Final    Comment:        The GeneXpert MRSA Assay (FDA approved for NASAL specimens only), is one component of a comprehensive MRSA colonization surveillance program. It is not intended to diagnose MRSA infection nor to guide or monitor treatment for MRSA infections.     Medical History: Past Medical History  Diagnosis Date  .  Leukocytosis   . COPD (chronic obstructive pulmonary disease) ([place])   . Allergic rhinitis   . DM (diabetes mellitus) ([place])   . Bipolar affective disorder ([place])   . Hyperlipidemia   . Smoking   . Cough 10/30/2011  . SOB (shortness of breath) 10/30/2011  . Mediastinal adenopathy 11/26/2011   Assessment: 15 YOF presents with abd pain and N/V.  Found to have ruptured appendix and multiple abscesses.  Pharmacy asked to dose ciprofloxacin (ceftriaxone stopped for PCN allergy) and metronidazole. Patient taken to OR (no note available at this time), several antibiotic changes ordered but  spoke with PCCM for clarification.    Ceftriaxone x1 in ED  10/19 >> cipro >> 10/19 10/19 >> metronidazole >> 10/19 10/19 >> imipenem >> 10/19 >> vancomycin >> 10/19 >> fluconazole >>  Renal: now back to baseline WBC elevated - worsening overnight Tm = 100.8  Levels/dose adjustments: 10/21: vanco to 1gm q12h for improved renal fx  Goal of Therapy:  Vancomycin trough 15-20 mcg/ml Dose for indication and for patient-specific parameters  Plan:  Day #2 abx  Adjust vancomycin to 1000mg  IV q12h for improved renal function  Check trough at steady state and monitor renal function  Imipenem 500mg  IV q6h  Fluconazole 400mg  IV q24h  Doreene Eland, PharmD, BCPS.   Pager: [phone] 02/02/2015,9:40 AM

## 2015-02-02 NOTE — Progress Notes (Signed)
Echocardiogram 2D Echocardiogram has been performed.  Mercedes Thornton 02/02/2015, 9:53 AM

## 2015-02-02 NOTE — Care Management Note (Signed)
Case Management Note  Patient Details  Name: Cristal Qadir MRN: [id number] Date of Birth: Nov 08, 1945  Subjective/Objective:                 lap appy/a.fib s/p surg-iv cardizem drip   Action/Plan:  Will follow for progression and needs   Expected Discharge Date:   (UNKNOWN)               Expected Discharge Plan:  Home/Self Care  In-House Referral:  NA  Discharge planning Services  CM Consult  Post Acute Care Choice:  NA Choice offered to:  NA  DME Arranged:    DME Agency:     HH Arranged:    HH Agency:     Status of Service:  In process, will continue to follow  Medicare Important Message Given:    Date Medicare IM Given:    Medicare IM give by:    Date Additional Medicare IM Given:    Additional Medicare Important Message give by:     If discussed at [place] of Stay Meetings, dates discussed:    Additional Comments:  Leeroy Cha, RN 02/02/2015, 11:23 AM

## 2015-02-02 NOTE — Progress Notes (Signed)
[place] Progress Note Patient Name: Sarika Baldini DOB: 07-Aug-1945 MRN: [id number]   Date of Service  02/02/2015  HPI/Events of Note  A fib with RVR  eICU Interventions  Will add lopressor 5 mg IV q6h prn for HR > 120     Intervention Category Major Interventions: Other:  Philopateer Strine 02/02/2015, 7:16 PM

## 2015-02-02 NOTE — Progress Notes (Signed)
Patient FiO2 increased per desaturation to 87-88% on a FiO2 of 50% via vent. RT spoke with Dr. [place] Bells and SBT will not be performed today. Patient is receiving diuretics and MD wants to continue those and full support on vent today and overnight. SBT to be performed tomorrow if patient is clinically ready.  RN aware and at bedside. RT will continue to monitor patient.

## 2015-02-02 NOTE — Progress Notes (Signed)
[place] Progress Note Patient Name: Mercedes Thornton DOB: 11/18/1945 MRN: [id number]   Date of Service  02/02/2015  HPI/Events of Note  Hypokal;emia from this pm replaced  eICU Interventions       Intervention Category Minor Interventions: Electrolytes abnormality - evaluation and management  Burlie Cajamarca S. 02/02/2015, 1:35 AM

## 2015-02-02 NOTE — Progress Notes (Signed)
[place] Progress Note Patient Name: Mercedes Thornton DOB: 12-Apr-1946 MRN: [id number]   Date of Service  02/02/2015  HPI/Events of Note    eICU Interventions  d10 decreased to 30cc/h, may be able to turn off later 10/21     Intervention Category Intermediate Interventions: Hyperglycemia - evaluation and treatment  Alyxander Kollmann S. 02/02/2015, 5:23 AM

## 2015-02-02 NOTE — Progress Notes (Signed)
PULMONARY / CRITICAL CARE MEDICINE   Name: Nylan Nakatani MRN: [id number] DOB: May 17, 1945    ADMISSION DATE:  01/31/2015 CONSULTATION DATE:  01/31/15  REFERRING MD :  Dr. Tyrone Nine / EDP   CHIEF COMPLAINT:  Ruptured Appendix   INITIAL PRESENTATION: 69 y/o F, current smoker, with a PMH of COPD, DM, HLD, who presented to [facility] on 10/19 with nausea/vomiting, abdominal pain, chills and productive cough.  Found to have a ruptured appendix with developing pelvic abscess. PCCM consulted for admission.    STUDIES:  10/19 CT ABD / Pelvis >> findings most compatible with ruptured appendicitis with developing pelvic abscess, moderate small bowel dilation concerning for ileus rather than obstruction, cholelithiasis, unchanged pericardial effusion, distal esophageal thickening  SIGNIFICANT EVENTS: 10/19  Admit with ruptured appendix> taken to OR, abscess drained, appendectomy (ruptured, appendolith noted), lysis of adhesions, closed 10/20 failed SBT this morning, Afib with RVR, hypoxemia, placed on cardizem drip   SUBJECTIVE:  failed SBT, Afib with RVR, hypoxemia, placed on cardizem drip  VITAL SIGNS: Temp:  [98.5 F (36.9 C)-100.8 F (38.2 C)] 100.3 F (37.9 C) (10/21 0400) Pulse Rate:  [79-149] 87 (10/21 0904) Resp:  [19-34] 32 (10/21 0904) BP: (59-127)/(20-83) 96/56 mmHg (10/21 0904) SpO2:  [89 %-98 %] 96 % (10/21 0904) FiO2 (%):  [50 %-100 %] 50 % (10/21 0904) Weight:  [89.3 kg (196 lb 13.9 oz)] 89.3 kg (196 lb 13.9 oz) (10/21 0400)   HEMODYNAMICS: CVP:  [14 mmHg-22 mmHg] 16 mmHg   VENTILATOR SETTINGS: Vent Mode:  [-] PRVC FiO2 (%):  [50 %-100 %] 50 % Set Rate:  [15 bmp] 15 bmp Vt Set:  [500 mL] 500 mL PEEP:  [5 cmH20] 5 cmH20 Plateau Pressure:  [18 cmH20-23 cmH20] 18 cmH20   INTAKE / OUTPUT:  Intake/Output Summary (Last 24 hours) at 02/02/15 1017 Last data filed at 02/02/15 0700  Gross per 24 hour  Intake 4532.72 ml  Output   2420 ml  Net 2112.72 ml    PHYSICAL  EXAMINATION: General:  Awake on vent, following commands, anxious HENT: NCAT, EOMi, ETT in place PULM: Crackles R lung, shallow breaths supported by vent CV: Tachy, irregular, no mgr GI: + bowel sounds, soft, JP drain in place MSK: normal bulk, tone Derm: edema noted legs Neuro: Awake, interactive, follows commands, moves all four ext  LABS:  CBC  Recent Labs Lab 01/31/15 1010 02/01/15 0355 02/02/15 0610  WBC 18.4* 11.6* 26.4*  HGB 15.5* 10.8* 12.0  HCT 44.9 32.7* 38.1  PLT 254 329 392   Coag's No results for input(s): APTT, INR in the last 168 hours.   BMET  Recent Labs Lab 02/01/15 0355 02/01/15 1530 02/02/15 0610  NA 141 146* 137  K 3.6 3.0* 3.4*  CL 109 114* 104  CO2 25 26 23   BUN 29* 23* 12  CREATININE 0.69 0.80 0.63  GLUCOSE 132* 66 218*   Electrolytes  Recent Labs Lab 02/01/15 0355 02/01/15 1530 02/02/15 0610  CALCIUM 7.7* 7.3* 7.4*  PHOS 1.9*  --   --    Sepsis Markers  Recent Labs Lab 01/31/15 1136 01/31/15 1406 02/01/15 0355 02/01/15 1530 02/01/15 1800 02/02/15 0610  LATICACIDVEN  --  1.91  --  1.6 1.4  --   PROCALCITON 29.70  --  12.21  --   --  5.23   ABG  Recent Labs Lab 01/31/15 2137 02/01/15 1525  PHART 7.334* 7.442  PCO2ART 47.9* 33.9*  PO2ART 74.1* 89.2     Liver Enzymes  Recent Labs Lab 01/31/15 1136 02/01/15 0355  AST 28 29  ALT 18 12*  ALKPHOS 81 49  BILITOT 1.0 0.5  ALBUMIN 2.8* 2.1*   Cardiac Enzymes  Recent Labs Lab 02/01/15 1530 02/01/15 2110 02/02/15 0311  TROPONINI 0.13* 0.10* 0.08*     Glucose  Recent Labs Lab 02/01/15 2205 02/01/15 2346 02/02/15 0410 02/02/15 0500 02/02/15 0637 02/02/15 0752  GLUCAP 177* 176* 223* 220* 186* 208*    Imaging  10/21 CXR images personally reviewed> ETT in place, PICC in place, bilateral hazy opacities  ASSESSMENT / PLAN:  PULMONARY OETT 10/19 >  A: Tobacco Abuse  Acute Respiratory failure due to volume overload (acute pulmonary edema) in  setting of COPD; doubt acute lung injury/ARDS given volume resuscitation COPD, does not appear to be in exacerbation this morning, unclear baseline severity  P:   Vent stabilization protocol Continue full vent support this morning Change sedation, see neuro Diurese PRN albuterol + scheduled duoneb Smoking cessation counseling after extubation   CARDIOVASCULAR PICC ordered 10/20 A:  Shock> volume resuscitated, lactic acid normal; she has been septic but 10/21 propofol and afib seem to be contributing to shock Afib > now rate controlled Pericardial Effusion - hx of, noted on CT Chest 08/2014, appears enlarged on admit CXR but CT comparison unchanged Acute pulmonary edema HLD  P:  Neo titrated to MAP > 65 Repeat lactic acid today Hold dilt given hypotension Stop propofol Stop IVF F/U echocardiogram diurese Hold home lisinopril, lovastatin  RENAL A:   Hypokalemia  P:   Monitor BMET and UOP Replace electrolytes as needed  GASTROINTESTINAL A:   Ruptured Appendix with abscess > s/p appendectomy, lysis of adhesions, washout, and drain placement ileus Cholelithiasis  P:   Post op care per surgery Nutrition per surgery  H2 blocker for stress ulcer prophylaxis See ID   HEMATOLOGIC A:   Leukocytosis - in setting of intra-abdominal process  P:  See ID  Monitor CBC  Heparin sub cutaneously for DVT prophylaxis   INFECTIOUS A:   Sepsis due to ruptured Appendix with pelvic abscess P:   BCx2 10/19 >>  UC 10/19 >>   Cipro, start date 10/19, 1 dose Flagyl, start date 10/19, 1 dose  Imipenem 10/19 > narrow 10/22 if stable Vanc 10/19 > 10/21  Follow cultures PCT > 29 > 12 > 5  ENDOCRINE A:   Hypoglycemia resolved DM II   P:   CBG Q4 while NPO  Stop D10 Hold home metformin, glimepiride  NEUROLOGIC A:   Pain post op Bipolar Disorder  Anxiety > severe Sedation needs for vent synchrony P:   RASS goal: -1 Propofol drip > d/c due to hypotension Start  fentanyl gtt Add clonazepam, low dose for severe anxiety (home med) PRN ativan for anxiety    FAMILY  - Updates: Sister Drenda Freeze) and daughters updated on patients status at length 10/21  - Inter-disciplinary family meet or Palliative Care meeting due by:  10/26  Will take on PCCM service given multiple medical issues Discussed with Dr. Lucia Gaskins  My CC time 42 minutes today  Roselie Awkward, MD [place] [facility] Pager: [phone] Cell: [phone] After 3pm or if no response, call [phone]

## 2015-02-02 NOTE — Progress Notes (Signed)
General Surgery Note  LOS: 2 days  POD -  2 Days Post-Op  Assessment/Plan: 1.  LYSIS OF ADHESIONS /APPENDECTOMY LAPAROSCOPIC - 01/31/2015 Mercedes Thornton  For perforated appendicitis with multiple intra-abdominal abscess  WBC - 26,400 - 02/02/2015  Vanc/Primaxin/Diflucan  Has bowel sounds and she says her abdominal pain is less - will almost certainly need repeat abdominal CT scan about 5 days post op   2.  VDRF - on vent  Bibasilar atelectasis on CXR 3.  On pressors - neo 4.  Pericardial effusion 5.  COPD/ongoing tobacco use/mediastinal adenopathy/Chronic DOE  6.  AODM 7.  Bipolar/depression 8.  Hyperlipidemia 9.  DVT prophylaxis - SQ Heparin  Principal Problem:   Ruptured appendix Active Problems:   Perforated appendicitis   Hypoglycemia   Acute respiratory failure with hypoxemia   Sepsis (HCC)   Subjective:  Intubated, but awake.  Reasonably comfortable.. Objective:   Filed Vitals:   02/02/15 0730  BP: 102/70  Pulse:   Temp:   Resp: 27     Intake/Output from previous day:  10/20 0701 - 10/21 0700 In: 5077.7 [I.V.:4017.7; NG/GT:50; IV Piggyback:1000] Out: 2665 [Urine:2295; Emesis/NG output:350; Drains:20]  Intake/Output this shift:      Physical Exam:   General: AA F who is alert.   HEENT: Normal. Pupils equal. .   Lungs: Rhochi, bilaterally   Abdomen: Distended, but with BS   Wound: Clean Drain - only 20 cc recorded the last 24 hours   Lab Results:    Recent Labs  02/01/15 0355 02/02/15 0610  WBC 11.6* 26.4*  HGB 10.8* 12.0  HCT 32.7* 38.1  PLT 329 392    BMET   Recent Labs  02/01/15 1530 02/02/15 0610  NA 146* 137  K 3.0* 3.4*  CL 114* 104  CO2 26 23  GLUCOSE 66 218*  BUN 23* 12  CREATININE 0.80 0.63  CALCIUM 7.3* 7.4*    PT/INR  No results for input(s): LABPROT, INR in the last 72 hours.  ABG   Recent Labs  01/31/15 2137 02/01/15 1525  PHART 7.334* 7.442  HCO3 25.0* 22.8     Studies/Results:  Ct Abdomen Pelvis Wo  Contrast  01/31/2015  CLINICAL DATA:  Abdominal distension. Abdominal pain. Weakness. Leukocytosis. EXAM: CT ABDOMEN AND PELVIS WITHOUT CONTRAST TECHNIQUE: Multidetector CT imaging of the abdomen and pelvis was performed following the standard protocol without IV contrast. COMPARISON:  Chest CT 08/24/2014 FINDINGS: Subpleural opacities in the right lower lobe likely reflect dependent subsegmental atelectasis and trace pleural fluid or thickening. There is minimal atelectasis or scarring in the left lung base. Coronary artery calcification is noted. Pericardial effusion measures up to 1.5 cm in thickness and overall appears similar in volume to the prior chest CT although with a greater amount of the fluid located posteriorly on the current examination. Small gallstones are noted. No biliary dilatation is seen. The liver, kidneys, and pancreas have an unremarkable unenhanced appearance. A subcentimeter calcification in the spleen is unchanged, as is minimal bilateral adrenal gland thickening. There is the suggestion of mild distal esophageal wall thickening with a small amount of fluid or debris present, incompletely visualized. The appendix is identified in the right lower and central abdomen and is mildly distended and thick walled diffusely, measuring up to 13 mm in diameter. There is mild surrounding inflammatory stranding, and there is a small locule of extraluminal gas immediately inferior to the proximal appendix (series 2, image 69). More inferiorly in the right adnexa is a 4.2  x 3.6 cm gas and fluid collection. There is additional fluid and gas in the left pelvis which appears less organized, measuring approximately 9.6 x 3.9 cm (series 2, image 78). There is moderate dilatation of multiple proximal and mid small bowel loops containing air-fluid levels and measuring up to 5.6 cm in diameter. Gas and fluid are also present an nondilated colon. There is mild sigmoid colon wall thickening which is favored to  be secondary to adjacent pelvic inflammation. Bladder is unremarkable. Uterus is present. Extensive atherosclerotic vascular calcification is noted. Lower lumbar facet arthrosis is noted. Subcentimeter retroperitoneal lymph nodes are likely reactive. IMPRESSION: 1. Findings most compatible with ruptured appendicitis with likely developing pelvic abscesses. 2. Moderate small bowel dilatation favored to reflect ileus rather than mechanical obstruction. 3. Cholelithiasis. 4. Unchanged pericardial effusion. 5. Distal esophageal wall thickening, query esophagitis. These results were called by telephone at the time of interpretation on 01/31/2015 at 11:51 am to Dr. Tyrone Nine, who verbally acknowledged these results. Electronically Signed   By: Logan Bores M.D.   On: 01/31/2015 11:58   Dg Chest 2 View  01/31/2015  CLINICAL DATA:  Cough and congestion with increased weakness over the past 5 days. EXAM: CHEST  2 VIEW COMPARISON:  Chest CT 08/24/2014 FINDINGS: Cardiopericardial enlargement appears increased compared to scanogram from previous study, when pericardial effusion was present, but there is rotation to the left. Stable aortic and hilar contours. Poorly defined left diaphragm, likely from cardiac contact, with no pleural effusion seen on lateral imaging. Mild interstitial coarsening correlating with emphysema on previous CT. There is no edema, consolidation, or pneumothorax. IMPRESSION: 1. Cardiopericardial enlargement with apparent increase from chest CT 08/24/2014 potentially due to rotation. Given a pericardial effusion was present on the comparison CT, consider echocardiogram follow-up. 2. Emphysema without superimposed pneumonia or edema. Electronically Signed   By: Monte Fantasia M.D.   On: 01/31/2015 10:33   Portable Chest Xray  02/02/2015  CLINICAL DATA:  Respiratory failure. EXAM: PORTABLE CHEST 1 VIEW COMPARISON:  02/01/2015 . FINDINGS: Endotracheal tube and NG tube in stable position. Right PICC line  stable position. Cardiomegaly. Mild pulmonary interstitial prominence. Small bilateral pleural effusions. Low lung volumes with basilar atelectasis. No pneumothorax. IMPRESSION: 1. Lines and tubes in stable position . 2. Cardiomegaly of mild from interstitial prominence and small bilateral pleural effusions. A component congestive heart failure cannot be excluded. 3. Low lung volumes with persistent bibasilar atelectasis. Electronically Signed   By: [place]   On: 02/02/2015 07:15   Dg Chest Port 1 View  02/01/2015  CLINICAL DATA:  PICC line placement. History of atrial fibrillation, COPD, leukocytosis and smoking. EXAM: PORTABLE CHEST 1 VIEW COMPARISON:  02/01/2015 and 01/31/2015. FINDINGS: 1506 hours. Patient is rotated to the left. The endotracheal and nasogastric tubes appear unchanged. There is new right arm PICC with its tip inferior to the left mainstem bronchus, at the level of the lower SVC allowing for rotation. No evidence of pneumothorax. Bilateral pleural effusions and bibasilar airspace opacities are unchanged. IMPRESSION: PICC line tip at the lower SVC level. No pneumothorax or other significant changes. Electronically Signed   By: Richardean Sale M.D.   On: 02/01/2015 15:33   Dg Chest Port 1 View  02/01/2015  CLINICAL DATA:  Pericardial effusion. EXAM: PORTABLE CHEST 1 VIEW COMPARISON:  01/31/2015.  CT 08/24/2014 . FINDINGS: Endotracheal tube and NG tube in stable position. Stable cardiomegaly. Prior CT revealed a small pericardial effusion. No pulmonary venous congestion. Diffuse bilateral pulmonary interstitial prominence.  Previously identified density in the left AP window has resolved, this may have represented atelectasis. Small bilateral pleural effusions. No pneumothorax. IMPRESSION: 1. Lines and tubes in stable position. 2. Diffuse bilateral pulmonary interstitial prominence unchanged. Interstitial edema and/or pneumonitis could present in this fashion. Small bilateral pleural  effusions. 3. Previously identified density in the AP window has resolved. This may have represented atelectasis. Electronically Signed   By: [place]   On: 02/01/2015 07:34   Dg Chest Port 1 View  01/31/2015  CLINICAL DATA:  Endotracheal tube placement. EXAM: PORTABLE CHEST 1 VIEW COMPARISON:  01/31/2015 at 1016 hours. FINDINGS: Endotracheal tube terminates approximately 2.9 cm above the carina. Nasogastric tube is followed into the stomach with the side port in the region of the gastroesophageal junction. There is prominence of the AP window and left hilum. Consolidation in the lingula obscures the left heart border. There is interstitial prominence in the right hemi thorax. IMPRESSION: 1. Endotracheal tube is in satisfactory position. 2. Prominence of the AP window and left hilum, of uncertain etiology. New consolidation in the lingula. Consider further evaluation with CT chest with contrast, as clinically indicated. 3. Progressive interstitial prominence in the right lung, possibly due to edema. Electronically Signed   By: Lorin Picket M.D.   On: 01/31/2015 20:55     Anti-infectives:   Anti-infectives    Start     Dose/Rate Route Frequency Ordered Stop   02/01/15 2000  fluconazole (DIFLUCAN) IVPB 400 mg     400 mg 100 mL/hr over 120 Minutes Intravenous Every 24 hours 01/31/15 2033     02/01/15 1200  imipenem-cilastatin (PRIMAXIN) 500 mg in sodium chloride 0.9 % 100 mL IVPB     500 mg 200 mL/hr over 30 Minutes Intravenous 4 times per day 02/01/15 1143     02/01/15 1000  vancomycin (VANCOCIN) 1,250 mg in sodium chloride 0.9 % 250 mL IVPB     1,250 mg 166.7 mL/hr over 90 Minutes Intravenous Every 24 hours 01/31/15 2033     02/01/15 0600  ciprofloxacin (CIPRO) IVPB 400 mg  Status:  Discontinued     400 mg 200 mL/hr over 60 Minutes Intravenous Every 12 hours 01/31/15 1952 01/31/15 2019   01/31/15 2300  vancomycin (VANCOCIN) 1,250 mg in sodium chloride 0.9 % 250 mL IVPB     1,250  mg 166.7 mL/hr over 90 Minutes Intravenous  Once 01/31/15 2033 01/31/15 2353   01/31/15 2300  fluconazole (DIFLUCAN) IVPB 800 mg     800 mg 200 mL/hr over 120 Minutes Intravenous  Once 01/31/15 2033 02/01/15 0024   01/31/15 2200  ciprofloxacin (CIPRO) IVPB 400 mg  Status:  Discontinued     400 mg 200 mL/hr over 60 Minutes Intravenous Every 12 hours 01/31/15 1559 01/31/15 1952   01/31/15 2200  metroNIDAZOLE (FLAGYL) IVPB 500 mg  Status:  Discontinued     500 mg 100 mL/hr over 60 Minutes Intravenous Every 8 hours 01/31/15 1952 01/31/15 2019   01/31/15 2200  imipenem-cilastatin (PRIMAXIN) 500 mg in sodium chloride 0.9 % 100 mL IVPB  Status:  Discontinued     500 mg 200 mL/hr over 30 Minutes Intravenous 3 times per day 01/31/15 2033 02/01/15 1143   01/31/15 2000  metroNIDAZOLE (FLAGYL) IVPB 500 mg  Status:  Discontinued     500 mg 100 mL/hr over 60 Minutes Intravenous 3 times per day 01/31/15 1559 01/31/15 1952   01/31/15 1800  metroNIDAZOLE (FLAGYL) IVPB 500 mg  Status:  Discontinued  500 mg 100 mL/hr over 60 Minutes Intravenous 4 times per day 01/31/15 1543 01/31/15 1559   01/31/15 1215  metroNIDAZOLE (FLAGYL) IVPB 500 mg     500 mg 100 mL/hr over 60 Minutes Intravenous  Once 01/31/15 1201 01/31/15 1350   01/31/15 1100  cefTRIAXone (ROCEPHIN) 1 g in dextrose 5 % 50 mL IVPB     1 g 100 mL/hr over 30 Minutes Intravenous  Once 01/31/15 1051 01/31/15 1230      Alphonsa Overall, MD, FACS Pager: [place] [facility] Office: [phone] 02/02/2015

## 2015-02-03 ENCOUNTER — Inpatient Hospital Stay (HOSPITAL_COMMUNITY): Payer: Medicare Other

## 2015-02-03 LAB — URINALYSIS, ROUTINE W REFLEX MICROSCOPIC
BILIRUBIN URINE: NEGATIVE
Glucose, UA: NEGATIVE mg/dL
KETONES UR: NEGATIVE mg/dL
Leukocytes, UA: NEGATIVE
NITRITE: NEGATIVE
PH: 6 (ref 5.0–8.0)
Protein, ur: NEGATIVE mg/dL
Specific Gravity, Urine: 1.005 (ref 1.005–1.030)
UROBILINOGEN UA: 0.2 mg/dL (ref 0.0–1.0)

## 2015-02-03 LAB — BASIC METABOLIC PANEL
ANION GAP: 9 (ref 5–15)
BUN: 9 mg/dL (ref 6–20)
CHLORIDE: 105 mmol/L (ref 101–111)
CO2: 28 mmol/L (ref 22–32)
Calcium: 7.2 mg/dL — ABNORMAL LOW (ref 8.9–10.3)
Creatinine, Ser: 0.57 mg/dL (ref 0.44–1.00)
GFR calc non Af Amer: >60 mL/min (ref >60)
Glucose, Bld: 104 mg/dL — ABNORMAL HIGH (ref 65–99)
Potassium: 3.4 mmol/L — ABNORMAL LOW (ref 3.5–5.1)
Sodium: 142 mmol/L (ref 135–145)

## 2015-02-03 LAB — CBC WITH DIFFERENTIAL/PLATELET
BASOS PCT: 0 %
Basophils Absolute: 0 10*3/uL (ref 0.0–0.1)
EOS PCT: 1 %
Eosinophils Absolute: 0.3 10*3/uL (ref 0.0–0.7)
HEMATOCRIT: 35.1 % — AB (ref 36.0–46.0)
HEMOGLOBIN: 11.2 g/dL — AB (ref 12.0–15.0)
LYMPHS PCT: 8 %
Lymphs Abs: 2.5 10*3/uL (ref 0.7–4.0)
MCH: 28.6 pg (ref 26.0–34.0)
MCHC: 31.9 g/dL (ref 30.0–36.0)
MCV: 89.5 fL (ref 78.0–100.0)
Monocytes Absolute: 2.2 10*3/uL — ABNORMAL HIGH (ref 0.1–1.0)
Monocytes Relative: 7 %
NEUTROS PCT: 84 %
Neutro Abs: 26.5 10*3/uL — ABNORMAL HIGH (ref 1.7–7.7)
Platelets: 447 10*3/uL — ABNORMAL HIGH (ref 150–400)
RBC: 3.92 MIL/uL (ref 3.87–5.11)
RDW: 15.8 % — ABNORMAL HIGH (ref 11.5–15.5)
WBC: 31.5 10*3/uL — ABNORMAL HIGH (ref 4.0–10.5)

## 2015-02-03 LAB — GLUCOSE, CAPILLARY
GLUCOSE-CAPILLARY: 118 mg/dL — AB (ref 65–99)
GLUCOSE-CAPILLARY: 119 mg/dL — AB (ref 65–99)
GLUCOSE-CAPILLARY: 142 mg/dL — AB (ref 65–99)
Glucose-Capillary: 102 mg/dL — ABNORMAL HIGH (ref 65–99)
Glucose-Capillary: 90 mg/dL (ref 65–99)
Glucose-Capillary: 92 mg/dL (ref 65–99)

## 2015-02-03 LAB — URINE MICROSCOPIC-ADD ON

## 2015-02-03 MED ORDER — CHLORHEXIDINE GLUCONATE 0.12 % MT SOLN
OROMUCOSAL | Status: AC
  Administered 2015-02-03: 15 mL via OROMUCOSAL
  Filled 2015-02-03: qty 15

## 2015-02-03 MED ORDER — POTASSIUM CHLORIDE 10 MEQ/50ML IV SOLN
10.0000 meq | INTRAVENOUS | Status: AC
  Administered 2015-02-03 (×2): 10 meq via INTRAVENOUS
  Filled 2015-02-03 (×2): qty 50

## 2015-02-03 MED ORDER — FUROSEMIDE 10 MG/ML IJ SOLN
60.0000 mg | Freq: Two times a day (BID) | INTRAMUSCULAR | Status: DC
  Administered 2015-02-03 – 2015-02-05 (×5): 60 mg via INTRAVENOUS
  Filled 2015-02-03 (×5): qty 6

## 2015-02-03 NOTE — Progress Notes (Signed)
Several family members were bedside when I arrived; including daughter, son-in-law and sisters. Pt was awake (intubated). Family said they were doing well and did not have any needs/concerns. Please page if additional support is needed. Chaplain Marlise Eves Holder   02/03/15 1600  Clinical Encounter Type  Visited With Family

## 2015-02-03 NOTE — Progress Notes (Signed)
3 Days Post-Op  Subjective: Very anxious. On vent but alert  Objective: Vital signs in last 24 hours: Temp:  [97.6 F (36.4 C)-100.1 F (37.8 C)] 99.2 F (37.3 C) (10/22 0400) Pulse Rate:  [70-128] 80 (10/22 0520) Resp:  [10-36] 16 (10/22 0520) BP: (79-127)/(33-98) 120/75 mmHg (10/22 0500) SpO2:  [60 %-100 %] 97 % (10/22 0520) FiO2 (%):  [40 %-60 %] 40 % (10/22 0300) Weight:  [85.3 kg (188 lb 0.8 oz)] 85.3 kg (188 lb 0.8 oz) (10/22 0500)    Intake/Output from previous day: 10/21 0701 - 10/22 0700 In: 2375 [I.V.:1565; IV Piggyback:800] Out: 4800 [Urine:4400; Emesis/NG output:400] Intake/Output this shift: Total I/O In: 1145.8 [I.V.:790.8; Other:5; IV Piggyback:350] Out: 2595 [Urine:1050; Emesis/NG output:100]  Resp: clear to auscultation bilaterally and on vent Cardio: regular rate and rhythm GI: soft, minimal tenderness. few bs. incisions look good  Lab Results:   Recent Labs  02/01/15 0355 02/02/15 0610  WBC 11.6* 26.4*  HGB 10.8* 12.0  HCT 32.7* 38.1  PLT 329 392   BMET  Recent Labs  02/01/15 1530 02/02/15 0610  NA 146* 137  K 3.0* 3.4*  CL 114* 104  CO2 26 23  GLUCOSE 66 218*  BUN 23* 12  CREATININE 0.80 0.63  CALCIUM 7.3* 7.4*   PT/INR No results for input(s): LABPROT, INR in the last 72 hours. ABG  Recent Labs  01/31/15 2137 02/01/15 1525  PHART 7.334* 7.442  HCO3 25.0* 22.8    Studies/Results: Portable Chest Xray  02/02/2015  CLINICAL DATA:  Respiratory failure. EXAM: PORTABLE CHEST 1 VIEW COMPARISON:  02/01/2015 . FINDINGS: Endotracheal tube and NG tube in stable position. Right PICC line stable position. Cardiomegaly. Mild pulmonary interstitial prominence. Small bilateral pleural effusions. Low lung volumes with basilar atelectasis. No pneumothorax. IMPRESSION: 1. Lines and tubes in stable position . 2. Cardiomegaly of mild from interstitial prominence and small bilateral pleural effusions. A component congestive heart failure cannot be  excluded. 3. Low lung volumes with persistent bibasilar atelectasis. Electronically Signed   By: [place]   On: 02/02/2015 07:15   Dg Chest Port 1 View  02/01/2015  CLINICAL DATA:  PICC line placement. History of atrial fibrillation, COPD, leukocytosis and smoking. EXAM: PORTABLE CHEST 1 VIEW COMPARISON:  02/01/2015 and 01/31/2015. FINDINGS: 1506 hours. Patient is rotated to the left. The endotracheal and nasogastric tubes appear unchanged. There is new right arm PICC with its tip inferior to the left mainstem bronchus, at the level of the lower SVC allowing for rotation. No evidence of pneumothorax. Bilateral pleural effusions and bibasilar airspace opacities are unchanged. IMPRESSION: PICC line tip at the lower SVC level. No pneumothorax or other significant changes. Electronically Signed   By: Richardean Sale M.D.   On: 02/01/2015 15:33    Anti-infectives: Anti-infectives    Start     Dose/Rate Route Frequency Ordered Stop   02/02/15 1000  vancomycin (VANCOCIN) IVPB 1000 mg/200 mL premix  Status:  Discontinued     1,000 mg 200 mL/hr over 60 Minutes Intravenous Every 12 hours 02/02/15 0947 02/02/15 1049   02/01/15 2000  fluconazole (DIFLUCAN) IVPB 400 mg     400 mg 100 mL/hr over 120 Minutes Intravenous Every 24 hours 01/31/15 2033     02/01/15 1200  imipenem-cilastatin (PRIMAXIN) 500 mg in sodium chloride 0.9 % 100 mL IVPB     500 mg 200 mL/hr over 30 Minutes Intravenous 4 times per day 02/01/15 1143     02/01/15 1000  vancomycin (VANCOCIN)  1,250 mg in sodium chloride 0.9 % 250 mL IVPB  Status:  Discontinued     1,250 mg 166.7 mL/hr over 90 Minutes Intravenous Every 24 hours 01/31/15 2033 02/02/15 0947   02/01/15 0600  ciprofloxacin (CIPRO) IVPB 400 mg  Status:  Discontinued     400 mg 200 mL/hr over 60 Minutes Intravenous Every 12 hours 01/31/15 1952 01/31/15 2019   01/31/15 2300  vancomycin (VANCOCIN) 1,250 mg in sodium chloride 0.9 % 250 mL IVPB     1,250 mg 166.7 mL/hr over  90 Minutes Intravenous  Once 01/31/15 2033 01/31/15 2353   01/31/15 2300  fluconazole (DIFLUCAN) IVPB 800 mg     800 mg 200 mL/hr over 120 Minutes Intravenous  Once 01/31/15 2033 02/01/15 0024   01/31/15 2200  ciprofloxacin (CIPRO) IVPB 400 mg  Status:  Discontinued     400 mg 200 mL/hr over 60 Minutes Intravenous Every 12 hours 01/31/15 1559 01/31/15 1952   01/31/15 2200  metroNIDAZOLE (FLAGYL) IVPB 500 mg  Status:  Discontinued     500 mg 100 mL/hr over 60 Minutes Intravenous Every 8 hours 01/31/15 1952 01/31/15 2019   01/31/15 2200  imipenem-cilastatin (PRIMAXIN) 500 mg in sodium chloride 0.9 % 100 mL IVPB  Status:  Discontinued     500 mg 200 mL/hr over 30 Minutes Intravenous 3 times per day 01/31/15 2033 02/01/15 1143   01/31/15 2000  metroNIDAZOLE (FLAGYL) IVPB 500 mg  Status:  Discontinued     500 mg 100 mL/hr over 60 Minutes Intravenous 3 times per day 01/31/15 1559 01/31/15 1952   01/31/15 1800  metroNIDAZOLE (FLAGYL) IVPB 500 mg  Status:  Discontinued     500 mg 100 mL/hr over 60 Minutes Intravenous 4 times per day 01/31/15 1543 01/31/15 1559   01/31/15 1215  metroNIDAZOLE (FLAGYL) IVPB 500 mg     500 mg 100 mL/hr over 60 Minutes Intravenous  Once 01/31/15 1201 01/31/15 1350   01/31/15 1100  cefTRIAXone (ROCEPHIN) 1 g in dextrose 5 % 50 mL IVPB     1 g 100 mL/hr over 30 Minutes Intravenous  Once 01/31/15 1051 01/31/15 1230      Assessment/Plan: s/p Procedure(s): LYSIS OF ADHESIONS /APPENDECTOMY LAPAROSCOPIC  (N/A) Wean pressors as tolerated  Wean vent as tolerated. Continue primaxin/diflucan Slow improvement  LOS: 3 days    TOTH III,Omunique Pederson S 02/03/2015

## 2015-02-03 NOTE — Progress Notes (Signed)
Radiologist suggested to pull right PICC back 5cm. Consulted 2 PICC RN's, they looked@chest  x-ray and noted right PICC tip rests in lower SVC, therefore PICC tip is located in proper location.

## 2015-02-03 NOTE — Progress Notes (Signed)
Notified primary RN that right PICC placed by ECG is located in proper location per PICC RN.

## 2015-02-03 NOTE — Progress Notes (Addendum)
PULMONARY / CRITICAL CARE MEDICINE   Name: Mercedes Thornton MRN: [id number] DOB: 09-19-1945    ADMISSION DATE:  01/31/2015 CONSULTATION DATE:  01/31/15  REFERRING MD :  Dr. Tyrone Nine / EDP   CHIEF COMPLAINT:  Ruptured Appendix   INITIAL PRESENTATION: 69 y/o F, current smoker, with a PMH of COPD, DM, HLD, who presented to [facility] on 10/19 with nausea/vomiting, abdominal pain, chills and productive cough.  Found to have a ruptured appendix with developing pelvic abscess. PCCM consulted for admission.    STUDIES:  10/19 CT ABD / Pelvis >> findings most compatible with ruptured appendicitis with developing pelvic abscess, moderate small bowel dilation concerning for ileus rather than obstruction, cholelithiasis, unchanged pericardial effusion, distal esophageal thickening  SIGNIFICANT EVENTS: 10/19  Admit with ruptured appendix> taken to OR, abscess drained, appendectomy (ruptured, appendolith noted), lysis of adhesions, closed 10/20 failed SBT this morning, Afib with RVR, hypoxemia, placed on cardizem drip   SUBJECTIVE: Doing better on 5/5 weaning trial today. Afib with RVR >> converted to sinus with lopressor.   VITAL SIGNS: Temp:  [97.6 F (36.4 C)-99.5 F (37.5 C)] 99.4 F (37.4 C) (10/22 0800) Pulse Rate:  [70-128] 92 (10/22 1100) Resp:  [10-32] 22 (10/22 1100) BP: (79-135)/(33-75) 106/58 mmHg (10/22 1100) SpO2:  [60 %-100 %] 94 % (10/22 1100) FiO2 (%):  [40 %-60 %] 40 % (10/22 0826) Weight:  [188 lb 0.8 oz (85.3 kg)] 188 lb 0.8 oz (85.3 kg) (10/22 0500)   HEMODYNAMICS: CVP:  [8 mmHg-24 mmHg] 14 mmHg   VENTILATOR SETTINGS: Vent Mode:  [-] PSV;CPAP FiO2 (%):  [40 %-60 %] 40 % Set Rate:  [15 bmp-16 bmp] 16 bmp Vt Set:  [500 mL] 500 mL PEEP:  [5 cmH20] 5 cmH20 Pressure Support:  [5 cmH20] 5 cmH20 Plateau Pressure:  [16 cmH20-26 cmH20] 16 cmH20   INTAKE / OUTPUT:  Intake/Output Summary (Last 24 hours) at 02/03/15 1122 Last data filed at 02/03/15 1100  Gross per 24 hour  Intake  2317.21 ml  Output   4925 ml  Net -2607.79 ml    PHYSICAL EXAMINATION: General:  Sedated, opens eyes to command. HENT: Moist mucus membranes.  PULM: Clear anteriorly. No wheeze or crackles. CV: RRR, No MRG GI: + BS, JP drain in place  LABS:  CBC  Recent Labs Lab 02/01/15 0355 02/02/15 0610 02/03/15 0500  WBC 11.6* 26.4* 31.5*  HGB 10.8* 12.0 11.2*  HCT 32.7* 38.1 35.1*  PLT 329 392 447*   Coag's No results for input(s): APTT, INR in the last 168 hours.   BMET  Recent Labs Lab 02/01/15 1530 02/02/15 0610 02/03/15 0755  NA 146* 137 142  K 3.0* 3.4* 3.4*  CL 114* 104 105  CO2 26 23 28   BUN 23* 12 9  CREATININE 0.80 0.63 0.57  GLUCOSE 66 218* 104*   Electrolytes  Recent Labs Lab 02/01/15 0355 02/01/15 1530 02/02/15 0610 02/03/15 0755  CALCIUM 7.7* 7.3* 7.4* 7.2*  PHOS 1.9*  --   --   --    Sepsis Markers  Recent Labs Lab 01/31/15 1136  02/01/15 0355 02/01/15 1530 02/01/15 1800 02/02/15 0610 02/02/15 1318  LATICACIDVEN  --   < >  --  1.6 1.4  --  1.22  PROCALCITON 29.70  --  12.21  --   --  5.23  --   < > = values in this interval not displayed. ABG  Recent Labs Lab 01/31/15 2137 02/01/15 1525  PHART 7.334* 7.442  PCO2ART 47.9* 33.9*  PO2ART 74.1* 89.2     Liver Enzymes  Recent Labs Lab 01/31/15 1136 02/01/15 0355  AST 28 29  ALT 18 12*  ALKPHOS 81 49  BILITOT 1.0 0.5  ALBUMIN 2.8* 2.1*   Cardiac Enzymes  Recent Labs Lab 02/01/15 1530 02/01/15 2110 02/02/15 0311  TROPONINI 0.13* 0.10* 0.08*     Glucose  Recent Labs Lab 02/02/15 1248 02/02/15 1556 02/02/15 1941 02/03/15 0006 02/03/15 0359 02/03/15 0747  GLUCAP 203* 160* 101* 92 102* 90    Imaging  10/22 CXR images personally reviewed> ETT in place, PICC in place, Lung opacities unchanged.  ASSESSMENT / PLAN:  PULMONARY OETT 10/19 >  A: Tobacco Abuse  Acute Respiratory failure due to volume overload (acute pulmonary edema) in setting of COPD; doubt  acute lung injury/ARDS given volume resuscitation COPD, does not appear to be in exacerbation this morning, unclear baseline severity  P:   Vent stabilization protocol Continue daily SBTs. Diurese PRN albuterol + scheduled duoneb Smoking cessation counseling after extubation   CARDIOVASCULAR PICC ordered 10/20 A:  Shock> volume resuscitated, lactic acid normal; she has been septic but 10/21 propofol and afib seem to be contributing to shock Afib > now rate controlled Pericardial Effusion - hx of, noted on CT Chest 08/2014, appears enlarged on admit CXR but CT comparison unchanged Acute pulmonary edema HLD  P:  Weaning down the neo.  Diurese Hold home lisinopril, lovastatin  RENAL A:   Hypokalemia  P:   Monitor BMET and UOP Replace electrolytes as needed  GASTROINTESTINAL A:   Ruptured Appendix with abscess > s/p appendectomy, lysis of adhesions, washout, and drain placement ileus Cholelithiasis  P:   Post op care per surgery Nutrition per surgery  H2 blocker for stress ulcer prophylaxis See ID   HEMATOLOGIC A:   Leukocytosis - in setting of intra-abdominal process  P:  See ID  Monitor CBC  Heparin sub cutaneously for DVT prophylaxis   INFECTIOUS A:   Sepsis due to ruptured Appendix with pelvic abscess P:   BCx2 10/19 >>  UC 10/19 >>   Cipro, start date 10/19, 1 dose Flagyl, start date 10/19, 1 dose  Imipenem 10/19 >  Vanc 10/19 > 10/21  Follow cultures PCT > 29 > 12 > 5  WBC count higher today. Recheck blood cultures, UA/Cx, C diff if she has diarrhea.  ENDOCRINE A:   Hypoglycemia resolved DM II   P:   CBG Q4 while NPO  Hold home metformin, glimepiride  NEUROLOGIC A:   Pain post op Bipolar Disorder  Anxiety > severe Sedation needs for vent synchrony P:   RASS goal: -1 On precedex and fentanyl for sedation clonazepam, low dose for severe anxiety (home med) PRN ativan for anxiety    FAMILY  - Updates: Sister Drenda Freeze) and  daughters updated on patients status at length 10/22 - Inter-disciplinary family meet or Palliative Care meeting due by:  10/26  CC time 40 minutes today  Marshell Garfinkel MD [place] Pager [phone] If no answer or after 3pm call: [phone] 02/03/2015, 11:29 AM

## 2015-02-04 LAB — GLUCOSE, CAPILLARY
GLUCOSE-CAPILLARY: 132 mg/dL — AB (ref 65–99)
GLUCOSE-CAPILLARY: 136 mg/dL — AB (ref 65–99)
GLUCOSE-CAPILLARY: 151 mg/dL — AB (ref 65–99)
GLUCOSE-CAPILLARY: 161 mg/dL — AB (ref 65–99)
GLUCOSE-CAPILLARY: 185 mg/dL — AB (ref 65–99)

## 2015-02-04 LAB — MAGNESIUM: Magnesium: 0.9 mg/dL — CL (ref 1.7–2.4)

## 2015-02-04 LAB — PHOSPHORUS: PHOSPHORUS: 2.9 mg/dL (ref 2.5–4.6)

## 2015-02-04 MED ORDER — AMIODARONE HCL IN DEXTROSE 360-4.14 MG/200ML-% IV SOLN
60.0000 mg/h | INTRAVENOUS | Status: AC
  Administered 2015-02-04: 60 mg/h via INTRAVENOUS
  Filled 2015-02-04 (×2): qty 200

## 2015-02-04 MED ORDER — AMIODARONE LOAD VIA INFUSION
150.0000 mg | Freq: Once | INTRAVENOUS | Status: AC
  Administered 2015-02-04: 150 mg via INTRAVENOUS
  Filled 2015-02-04: qty 83.34

## 2015-02-04 MED ORDER — MAGNESIUM SULFATE 50 % IJ SOLN
6.0000 g | Freq: Once | INTRAMUSCULAR | Status: AC
  Administered 2015-02-04: 6 g via INTRAVENOUS
  Filled 2015-02-04: qty 2

## 2015-02-04 MED ORDER — LIP MEDEX EX OINT
TOPICAL_OINTMENT | CUTANEOUS | Status: DC | PRN
  Filled 2015-02-04: qty 7

## 2015-02-04 MED ORDER — AMIODARONE HCL IN DEXTROSE 360-4.14 MG/200ML-% IV SOLN
30.0000 mg/h | INTRAVENOUS | Status: DC
  Administered 2015-02-05 – 2015-02-08 (×6): 30 mg/h via INTRAVENOUS
  Filled 2015-02-04 (×7): qty 200

## 2015-02-04 NOTE — Procedures (Signed)
Extubation Procedure Note  Patient Details:   Name: Mercedes Thornton DOB: April 08, 1946 MRN: [id number]   Airway Documentation:     Evaluation  O2 sats: stable throughout Complications: No apparent complications Patient did tolerate procedure well. Bilateral Breath Sounds: Clear, Diminished Suctioning: Oral, Airway Yes    Irish Elders Royal 02/04/2015, 12:17 PM

## 2015-02-04 NOTE — Progress Notes (Signed)
[place] Progress Note Patient Name: Mercedes Thornton DOB: 03/04/1946 MRN: [id number]   Date of Service  02/04/2015  HPI/Events of Note  Mg- 0.9  eICU Interventions  Ordered replacement protocol     Intervention Category Minor Interventions: Electrolytes abnormality - evaluation and management  Dimas Chyle 02/04/2015, 5:13 PM

## 2015-02-04 NOTE — Progress Notes (Signed)
4 Days Post-Op  Subjective: Awake and alert on vent  Objective: Vital signs in last 24 hours: Temp:  [99.4 F (37.4 C)-101 F (38.3 C)] 101 F (38.3 C) (10/23 0400) Pulse Rate:  [52-99] 75 (10/23 0630) Resp:  [15-30] 27 (10/23 0630) BP: (62-166)/(28-80) 135/72 mmHg (10/23 0630) SpO2:  [91 %-100 %] 95 % (10/23 0823) FiO2 (%):  [30 %-40 %] 30 % (10/23 0823) Weight:  [82.2 kg (181 lb 3.5 oz)] 82.2 kg (181 lb 3.5 oz) (10/23 0500)    Intake/Output from previous day: 10/22 0701 - 10/23 0700 In: 2019.1 [I.V.:1174.1; NG/GT:30; IV Piggyback:800] Out: 3500 [Urine:3765; Emesis/NG output:150; Drains:20] Intake/Output this shift:    Resp: clear to auscultation bilaterally and on vent Cardio: regular rate and rhythm GI: soft, nontender  Lab Results:   Recent Labs  02/02/15 0610 02/03/15 0500  WBC 26.4* 31.5*  HGB 12.0 11.2*  HCT 38.1 35.1*  PLT 392 447*   BMET  Recent Labs  02/02/15 0610 02/03/15 0755  NA 137 142  K 3.4* 3.4*  CL 104 105  CO2 23 28  GLUCOSE 218* 104*  BUN 12 9  CREATININE 0.63 0.57  CALCIUM 7.4* 7.2*   PT/INR No results for input(s): LABPROT, INR in the last 72 hours. ABG  Recent Labs  02/01/15 1525  PHART 7.442  HCO3 22.8    Studies/Results: Dg Chest Port 1 View  02/03/2015  CLINICAL DATA:  Acute respiratory failure with hypoxia. EXAM: PORTABLE CHEST 1 VIEW COMPARISON:  02/02/2015 FINDINGS: Endotracheal tube with tip 4.1 cm above the carina. Nasogastric tube unchanged. Right-sided PICC line has tip over the right atrium. Lungs are hypoinflated demonstrate persistent opacification over the left base/ retrocardiac region obscuration of the hemidiaphragm. Stable mild bilateral interstitial prominence. Stable cardiomegaly. Remainder of the exam is unchanged. IMPRESSION: Stable mild bilateral interstitial prominence with stable left base opacification likely effusions/atelectasis although cannot exclude infection. Stable cardiomegaly. Tubes and  lines as described. Right-sided PICC line has tip over the right atrium and could be pulled back approximately 5 cm. These results were called by telephone at the time of interpretation on 02/03/2015 at 9:04 am to patient's nurse, Wardell Honour, who verbally acknowledged these results. Electronically Signed   By: Marin Olp M.D.   On: 02/03/2015 09:04    Anti-infectives: Anti-infectives    Start     Dose/Rate Route Frequency Ordered Stop   02/02/15 1000  vancomycin (VANCOCIN) IVPB 1000 mg/200 mL premix  Status:  Discontinued     1,000 mg 200 mL/hr over 60 Minutes Intravenous Every 12 hours 02/02/15 0947 02/02/15 1049   02/01/15 2000  fluconazole (DIFLUCAN) IVPB 400 mg     400 mg 100 mL/hr over 120 Minutes Intravenous Every 24 hours 01/31/15 2033     02/01/15 1200  imipenem-cilastatin (PRIMAXIN) 500 mg in sodium chloride 0.9 % 100 mL IVPB     500 mg 200 mL/hr over 30 Minutes Intravenous 4 times per day 02/01/15 1143     02/01/15 1000  vancomycin (VANCOCIN) 1,250 mg in sodium chloride 0.9 % 250 mL IVPB  Status:  Discontinued     1,250 mg 166.7 mL/hr over 90 Minutes Intravenous Every 24 hours 01/31/15 2033 02/02/15 0947   02/01/15 0600  ciprofloxacin (CIPRO) IVPB 400 mg  Status:  Discontinued     400 mg 200 mL/hr over 60 Minutes Intravenous Every 12 hours 01/31/15 1952 01/31/15 2019   01/31/15 2300  vancomycin (VANCOCIN) 1,250 mg in sodium chloride 0.9 % 250 mL  IVPB     1,250 mg 166.7 mL/hr over 90 Minutes Intravenous  Once 01/31/15 2033 01/31/15 2353   01/31/15 2300  fluconazole (DIFLUCAN) IVPB 800 mg     800 mg 200 mL/hr over 120 Minutes Intravenous  Once 01/31/15 2033 02/01/15 0024   01/31/15 2200  ciprofloxacin (CIPRO) IVPB 400 mg  Status:  Discontinued     400 mg 200 mL/hr over 60 Minutes Intravenous Every 12 hours 01/31/15 1559 01/31/15 1952   01/31/15 2200  metroNIDAZOLE (FLAGYL) IVPB 500 mg  Status:  Discontinued     500 mg 100 mL/hr over 60 Minutes Intravenous Every 8 hours  01/31/15 1952 01/31/15 2019   01/31/15 2200  imipenem-cilastatin (PRIMAXIN) 500 mg in sodium chloride 0.9 % 100 mL IVPB  Status:  Discontinued     500 mg 200 mL/hr over 30 Minutes Intravenous 3 times per day 01/31/15 2033 02/01/15 1143   01/31/15 2000  metroNIDAZOLE (FLAGYL) IVPB 500 mg  Status:  Discontinued     500 mg 100 mL/hr over 60 Minutes Intravenous 3 times per day 01/31/15 1559 01/31/15 1952   01/31/15 1800  metroNIDAZOLE (FLAGYL) IVPB 500 mg  Status:  Discontinued     500 mg 100 mL/hr over 60 Minutes Intravenous 4 times per day 01/31/15 1543 01/31/15 1559   01/31/15 1215  metroNIDAZOLE (FLAGYL) IVPB 500 mg     500 mg 100 mL/hr over 60 Minutes Intravenous  Once 01/31/15 1201 01/31/15 1350   01/31/15 1100  cefTRIAXone (ROCEPHIN) 1 g in dextrose 5 % 50 mL IVPB     1 g 100 mL/hr over 30 Minutes Intravenous  Once 01/31/15 1051 01/31/15 1230      Assessment/Plan: s/p Procedure(s): LYSIS OF ADHESIONS /APPENDECTOMY LAPAROSCOPIC  (N/A) Wean pressors as tolerated  Wean vent as tolerated Continue primaxin/diflucan  LOS: 4 days    TOTH III,PAUL S 02/04/2015

## 2015-02-04 NOTE — Progress Notes (Signed)
28mL of 43mcg/mL of fentanyl wasted in sink. Witnessed by [employer], RN.

## 2015-02-04 NOTE — Progress Notes (Signed)
CRITICAL VALUE ALERT  Critical value received:magnesium 0.9  Date of notification:02/04/2015  Time of notification: 1705  Critical value read back: yes  Nurse who received alert: Jacklynn Lewis RN  MD notified (1st page): E Link Dr Lyndel Safe  Time of first page:  1710  MD notified (2nd page)  Time of second page  Responding MD: Dr Lyndel Safe  Time MD responded:  1710

## 2015-02-04 NOTE — Progress Notes (Signed)
Patient with x2 episodes of Afib up to the 130's. First episode resolved with 5mg  lopressor. Second episode did not resolve with prn dose. Dr. Vaughan Browner made aware and to begin patient on amiodarone.

## 2015-02-04 NOTE — Progress Notes (Addendum)
PULMONARY / CRITICAL CARE MEDICINE   Name: Mercedes Thornton MRN: [id number] DOB: Apr 28, 1945    ADMISSION DATE:  01/31/2015 CONSULTATION DATE:  01/31/15  REFERRING MD :  Dr. Tyrone Nine / EDP   CHIEF COMPLAINT:  Ruptured Appendix   INITIAL PRESENTATION: 69 y/o F, current smoker, with a PMH of COPD, DM, HLD, who presented to [facility] on 10/19 with nausea/vomiting, abdominal pain, chills and productive cough.  Found to have a ruptured appendix with developing pelvic abscess. PCCM consulted for admission.    STUDIES:  10/19 CT ABD / Pelvis >> findings most compatible with ruptured appendicitis with developing pelvic abscess, moderate small bowel dilation concerning for ileus rather than obstruction, cholelithiasis, unchanged pericardial effusion, distal esophageal thickening  SIGNIFICANT EVENTS: 10/19  Admit with ruptured appendix> taken to OR, abscess drained, appendectomy (ruptured, appendolith noted), lysis of adhesions, closed 10/20 failed SBT this morning, Afib with RVR, hypoxemia, placed on cardizem drip   SUBJECTIVE: Doing better on 5/5 weaning trial today. Afib with RVR >> converted to sinus with lopressor.   VITAL SIGNS: Temp:  [99.4 F (37.4 C)-101 F (38.3 C)] 101 F (38.3 C) (10/23 0400) Pulse Rate:  [52-135] 76 (10/23 1145) Resp:  [15-31] 30 (10/23 1145) BP: (62-166)/(28-80) 116/54 mmHg (10/23 1145) SpO2:  [91 %-100 %] 94 % (10/23 1145) FiO2 (%):  [30 %-40 %] 30 % (10/23 1100) Weight:  [181 lb 3.5 oz (82.2 kg)] 181 lb 3.5 oz (82.2 kg) (10/23 0500)   HEMODYNAMICS: CVP:  [6 mmHg-13 mmHg] 9 mmHg   VENTILATOR SETTINGS: Vent Mode:  [-] PSV;CPAP FiO2 (%):  [30 %-40 %] 30 % Set Rate:  [15 bmp-16 bmp] 15 bmp Vt Set:  [500 mL] 500 mL PEEP:  [5 cmH20] 5 cmH20 Pressure Support:  [5 cmH20] 5 cmH20 Plateau Pressure:  [16 cmH20-19 cmH20] 19 cmH20   INTAKE / OUTPUT:  Intake/Output Summary (Last 24 hours) at 02/04/15 1154 Last data filed at 02/04/15 1100  Gross per 24 hour  Intake  1953.9 ml  Output   4560 ml  Net -2606.1 ml    PHYSICAL EXAMINATION: General:  Sedated, opens eyes to command. HENT: Moist mucus membranes.  PULM: Clear anteriorly. No wheeze or crackles. CV: RRR, No MRG GI: + BS, JP drain in place  LABS:  CBC  Recent Labs Lab 02/01/15 0355 02/02/15 0610 02/03/15 0500  WBC 11.6* 26.4* 31.5*  HGB 10.8* 12.0 11.2*  HCT 32.7* 38.1 35.1*  PLT 329 392 447*   Coag's No results for input(s): APTT, INR in the last 168 hours.   BMET  Recent Labs Lab 02/01/15 1530 02/02/15 0610 02/03/15 0755  NA 146* 137 142  K 3.0* 3.4* 3.4*  CL 114* 104 105  CO2 26 23 28   BUN 23* 12 9  CREATININE 0.80 0.63 0.57  GLUCOSE 66 218* 104*   Electrolytes  Recent Labs Lab 02/01/15 0355 02/01/15 1530 02/02/15 0610 02/03/15 0755  CALCIUM 7.7* 7.3* 7.4* 7.2*  PHOS 1.9*  --   --   --    Sepsis Markers  Recent Labs Lab 01/31/15 1136  02/01/15 0355 02/01/15 1530 02/01/15 1800 02/02/15 0610 02/02/15 1318  LATICACIDVEN  --   < >  --  1.6 1.4  --  1.22  PROCALCITON 29.70  --  12.21  --   --  5.23  --   < > = values in this interval not displayed. ABG  Recent Labs Lab 01/31/15 2137 02/01/15 1525  PHART 7.334* 7.442  PCO2ART 47.9* 33.9*  PO2ART 74.1* 89.2     Liver Enzymes  Recent Labs Lab 01/31/15 1136 02/01/15 0355  AST 28 29  ALT 18 12*  ALKPHOS 81 49  BILITOT 1.0 0.5  ALBUMIN 2.8* 2.1*   Cardiac Enzymes  Recent Labs Lab 02/01/15 1530 02/01/15 2110 02/02/15 0311  TROPONINI 0.13* 0.10* 0.08*     Glucose  Recent Labs Lab 02/03/15 0747 02/03/15 1220 02/03/15 1645 02/03/15 2025 02/04/15 0027 02/04/15 0832  GLUCAP 90 118* 119* 142* 151* 136*    Imaging  10/22 CXR images personally reviewed> ETT in place, PICC in place, Lung opacities unchanged.  ASSESSMENT / PLAN:  PULMONARY OETT 10/19 >  A: Tobacco Abuse  Acute Respiratory failure due to volume overload (acute pulmonary edema) in setting of COPD; doubt  acute lung injury/ARDS given volume resuscitation COPD, does not appear to be in exacerbation this morning, unclear baseline severity  P:   Vent stabilization protocol Continue daily SBTs. Will extubate today. Diurese PRN albuterol + scheduled duoneb Smoking cessation counseling after extubation   CARDIOVASCULAR PICC ordered 10/20 A:  Shock> volume resuscitated, lactic acid normal; she has been septic but 10/21 propofol and afib seem to be contributing to shock Afib > now rate controlled Pericardial Effusion - hx of, noted on CT Chest 08/2014, appears enlarged on admit CXR but CT comparison unchanged Acute pulmonary edema HLD  P:  Weaning down the neo.  Diurese Hold home lisinopril, lovastatin Start amio for afib  RENAL A:   Hypokalemia  P:   Monitor BMET and UOP Replace electrolytes as needed  GASTROINTESTINAL A:   Ruptured Appendix with abscess > s/p appendectomy, lysis of adhesions, washout, and drain placement ileus Cholelithiasis  P:   Post op care per surgery Nutrition per surgery  H2 blocker for stress ulcer prophylaxis See ID   HEMATOLOGIC A:   Leukocytosis - in setting of intra-abdominal process  P:  See ID  Monitor CBC  Heparin sub cutaneously for DVT prophylaxis   INFECTIOUS A:   Sepsis due to ruptured Appendix with pelvic abscess P:   BCx2 10/19 >>  UC 10/19 >>   Cipro, start date 10/19, 1 dose Flagyl, start date 10/19, 1 dose  Imipenem 10/19 >  Vanc 10/19 > 10/21  Follow cultures PCT > 29 > 12 > 5  WBC count higher today but no fevers. She will need a repeat CT of abd pelvis to assess for abd abscess when she is stable from afib, hemodynamic perspective.  All cultures are negative to date.   ENDOCRINE A:   Hypoglycemia resolved DM II   P:   CBG Q4 while NPO  Hold home metformin, glimepiride.  NEUROLOGIC A:   Pain post op Bipolar Disorder  Anxiety > severe Sedation needs for vent synchrony P:   RASS goal: -1 On precedex  and fentanyl for sedation clonazepam, low dose for severe anxiety (home med) PRN ativan for anxiety    FAMILY  - Updates: Sister Drenda Freeze) and daughters updated on patients status at length 10/22 - Inter-disciplinary family meet or Palliative Care meeting due by:  10/26  CC time 40 minutes today  Marshell Garfinkel MD [facility] Pager [phone] If no answer or after 3pm call: [phone] 02/04/2015, 11:54 AM

## 2015-02-05 ENCOUNTER — Inpatient Hospital Stay (HOSPITAL_COMMUNITY): Payer: Medicare Other

## 2015-02-05 ENCOUNTER — Encounter (HOSPITAL_COMMUNITY): Payer: Self-pay | Admitting: Radiology

## 2015-02-05 LAB — BASIC METABOLIC PANEL
Anion gap: 10 (ref 5–15)
Anion gap: 10 (ref 5–15)
Anion gap: 15 (ref 5–15)
BUN: 10 mg/dL (ref 6–20)
BUN: 10 mg/dL (ref 6–20)
BUN: 11 mg/dL (ref 6–20)
CALCIUM: 6.6 mg/dL — AB (ref 8.9–10.3)
CHLORIDE: 100 mmol/L — AB (ref 101–111)
CO2: 29 mmol/L (ref 22–32)
CO2: 30 mmol/L (ref 22–32)
CO2: 31 mmol/L (ref 22–32)
CREATININE: 0.68 mg/dL (ref 0.44–1.00)
Calcium: 6.8 mg/dL — ABNORMAL LOW (ref 8.9–10.3)
Calcium: 7.6 mg/dL — ABNORMAL LOW (ref 8.9–10.3)
Chloride: 108 mmol/L (ref 101–111)
Chloride: 106 mmol/L (ref 101–111)
Creatinine, Ser: 0.75 mg/dL (ref 0.44–1.00)
Creatinine, Ser: 0.77 mg/dL (ref 0.44–1.00)
GFR calc Af Amer: >60 mL/min (ref >60)
GFR calc Af Amer: >60 mL/min (ref >60)
GFR calc non Af Amer: >60 mL/min (ref >60)
GLUCOSE: 197 mg/dL — AB (ref 65–99)
GLUCOSE: 198 mg/dL — AB (ref 65–99)
Glucose, Bld: 180 mg/dL — ABNORMAL HIGH (ref 65–99)
POTASSIUM: 6.5 mmol/L — AB (ref 3.5–5.1)
POTASSIUM: 2.7 mmol/L — AB (ref 3.5–5.1)
Potassium: 4.7 mmol/L (ref 3.5–5.1)
SODIUM: 147 mmol/L — AB (ref 135–145)
Sodium: 146 mmol/L — ABNORMAL HIGH (ref 135–145)
Sodium: 146 mmol/L — ABNORMAL HIGH (ref 135–145)

## 2015-02-05 LAB — GLUCOSE, CAPILLARY
GLUCOSE-CAPILLARY: 146 mg/dL — AB (ref 65–99)
Glucose-Capillary: 115 mg/dL — ABNORMAL HIGH (ref 65–99)
Glucose-Capillary: 185 mg/dL — ABNORMAL HIGH (ref 65–99)
Glucose-Capillary: 185 mg/dL — ABNORMAL HIGH (ref 65–99)
Glucose-Capillary: 185 mg/dL — ABNORMAL HIGH (ref 65–99)
Glucose-Capillary: 209 mg/dL — ABNORMAL HIGH (ref 65–99)
Glucose-Capillary: 209 mg/dL — ABNORMAL HIGH (ref 65–99)
Glucose-Capillary: 173 mg/dL — ABNORMAL HIGH (ref 65–99)
Glucose-Capillary: 182 mg/dL — ABNORMAL HIGH (ref 65–99)

## 2015-02-05 LAB — BASIC METABOLIC PANEL WITH GFR
Anion gap: 13 (ref 5–15)
BUN: 12 mg/dL (ref 6–20)
CO2: 31 mmol/L (ref 22–32)
Calcium: 7.6 mg/dL — ABNORMAL LOW (ref 8.9–10.3)
Chloride: 103 mmol/L (ref 101–111)
Creatinine, Ser: 0.72 mg/dL (ref 0.44–1.00)
GFR calc Af Amer: >60 mL/min
GFR calc non Af Amer: >60 mL/min
Glucose, Bld: 149 mg/dL — ABNORMAL HIGH (ref 65–99)
Potassium: 2.4 mmol/L — CL (ref 3.5–5.1)
Sodium: 147 mmol/L — ABNORMAL HIGH (ref 135–145)

## 2015-02-05 LAB — CBC
HCT: 31.3 % — ABNORMAL LOW (ref 36.0–46.0)
Hemoglobin: 10.4 g/dL — ABNORMAL LOW (ref 12.0–15.0)
MCH: 28.8 pg (ref 26.0–34.0)
MCHC: 33.2 g/dL (ref 30.0–36.0)
MCV: 86.7 fL (ref 78.0–100.0)
Platelets: 523 10*3/uL — ABNORMAL HIGH (ref 150–400)
RBC: 3.61 MIL/uL — ABNORMAL LOW (ref 3.87–5.11)
RDW: 15 % (ref 11.5–15.5)
WBC: 33.1 10*3/uL — ABNORMAL HIGH (ref 4.0–10.5)

## 2015-02-05 LAB — CULTURE, BLOOD (ROUTINE X 2)
Culture: NO GROWTH
Culture: NO GROWTH

## 2015-02-05 LAB — CBC WITH DIFFERENTIAL/PLATELET
Basophils Absolute: 0 10*3/uL (ref 0.0–0.1)
Basophils Relative: 0 %
Eosinophils Absolute: 0 10*3/uL (ref 0.0–0.7)
Eosinophils Relative: 0 %
HCT: 27 % — ABNORMAL LOW (ref 36.0–46.0)
Hemoglobin: 8.9 g/dL — ABNORMAL LOW (ref 12.0–15.0)
Lymphocytes Relative: 6 %
Lymphs Abs: 1.7 10*3/uL (ref 0.7–4.0)
MCH: 28.7 pg (ref 26.0–34.0)
MCHC: 33 g/dL (ref 30.0–36.0)
MCV: 87.1 fL (ref 78.0–100.0)
Monocytes Absolute: 2 10*3/uL — ABNORMAL HIGH (ref 0.1–1.0)
Monocytes Relative: 7 %
Neutro Abs: 25.2 10*3/uL — ABNORMAL HIGH (ref 1.7–7.7)
Neutrophils Relative %: 87 %
Platelets: 547 10*3/uL — ABNORMAL HIGH (ref 150–400)
RBC: 3.1 MIL/uL — ABNORMAL LOW (ref 3.87–5.11)
RDW: 15 % (ref 11.5–15.5)
WBC: 28.9 10*3/uL — ABNORMAL HIGH (ref 4.0–10.5)

## 2015-02-05 LAB — MAGNESIUM
Magnesium: 1.8 mg/dL (ref 1.7–2.4)
Magnesium: 1.2 mg/dL — ABNORMAL LOW (ref 1.7–2.4)
Magnesium: 1.3 mg/dL — ABNORMAL LOW (ref 1.7–2.4)

## 2015-02-05 LAB — URINE CULTURE: CULTURE: NO GROWTH

## 2015-02-05 LAB — PHOSPHORUS
Phosphorus: 3.1 mg/dL (ref 2.5–4.6)
Phosphorus: 2.3 mg/dL — ABNORMAL LOW (ref 2.5–4.6)

## 2015-02-05 MED ORDER — FUROSEMIDE 10 MG/ML IJ SOLN: 40.0000 mg | Freq: Every day | INTRAMUSCULAR | Status: DC

## 2015-02-05 MED ORDER — MAGNESIUM SULFATE 4 GM/100ML IV SOLN
4.0000 g | Freq: Once | INTRAVENOUS | Status: AC
  Administered 2015-02-05: 4 g via INTRAVENOUS
  Filled 2015-02-05: qty 100

## 2015-02-05 MED ORDER — POTASSIUM CHLORIDE IN NACL 40-0.9 MEQ/L-% IV SOLN
INTRAVENOUS | Status: DC
  Administered 2015-02-05 – 2015-02-07 (×2): 40 mL/h via INTRAVENOUS
  Filled 2015-02-05 (×6): qty 1000

## 2015-02-05 MED ORDER — POTASSIUM CHLORIDE 10 MEQ/50ML IV SOLN
10.0000 meq | INTRAVENOUS | Status: AC
  Administered 2015-02-05 (×6): 10 meq via INTRAVENOUS
  Filled 2015-02-05: qty 50

## 2015-02-05 MED ORDER — IOHEXOL 300 MG/ML  SOLN
100.0000 mL | Freq: Once | INTRAMUSCULAR | Status: AC | PRN
  Administered 2015-02-05: 100 mL via INTRAVENOUS

## 2015-02-05 MED ORDER — IOHEXOL 300 MG/ML  SOLN
25.0000 mL | INTRAMUSCULAR | Status: AC
  Administered 2015-02-05: 25 mL via ORAL

## 2015-02-05 NOTE — Progress Notes (Signed)
Abnormal BMET noted. CCM MD aware. Rechecking through peripheral draw.

## 2015-02-05 NOTE — Progress Notes (Signed)
CRITICAL VALUE ALERT  Critical value received:  K+ 2.4   Date of notification:  02/05/15  Time of notification:  1624  Critical value read back:Yes.    Nurse who received alert:  Darrin Nipper, RN  MD notified (1st page):  Elink  Time of first page:  0600

## 2015-02-05 NOTE — Progress Notes (Signed)
PULMONARY / CRITICAL CARE MEDICINE   Name: Mercedes Thornton MRN: [id number] DOB: November 07, 1945    ADMISSION DATE:  01/31/2015 CONSULTATION DATE:  01/31/15  REFERRING MD :  Dr. Tyrone Nine / EDP   CHIEF COMPLAINT:  Ruptured Appendix   INITIAL PRESENTATION: 69 y/o F, current smoker, with a PMH of COPD, DM, HLD, who presented to [facility] on 10/19 with nausea/vomiting, abdominal pain, chills and productive cough.  Found to have a ruptured appendix with developing pelvic abscess. PCCM consulted for admission.    STUDIES:  10/19 CT ABD / Pelvis >> findings most compatible with ruptured appendicitis with developing pelvic abscess, moderate small bowel dilation concerning for ileus rather than obstruction, cholelithiasis, unchanged pericardial effusion, distal esophageal thickening  SIGNIFICANT EVENTS: 10/19  Admit with ruptured appendix> taken to OR, abscess drained, appendectomy (ruptured, appendolith noted), lysis of adhesions, closed 10/20 failed SBT this morning, Afib with RVR, hypoxemia, placed on cardizem drip. 1024. Extrubated. Afib >> started on amiodarone drip.    SUBJECTIVE: Feels well.  VITAL SIGNS: Temp:  [97.9 F (36.6 C)-98.7 F (37.1 C)] 98.2 F (36.8 C) (10/24 0800) Pulse Rate:  [36-142] 74 (10/24 0700) Resp:  [18-45] 33 (10/24 0700) BP: (62-141)/(28-102) 132/52 mmHg (10/24 0700) SpO2:  [84 %-95 %] 95 % (10/24 0700) FiO2 (%):  [30 %-55 %] 45 % (10/24 0600) Weight:  [182 lb 15.7 oz (83 kg)] 182 lb 15.7 oz (83 kg) (10/24 0447)   HEMODYNAMICS: CVP:  [6 mmHg-9 mmHg] 9 mmHg   VENTILATOR SETTINGS: Vent Mode:  [-]  FiO2 (%):  [30 %-55 %] 45 %   INTAKE / OUTPUT:  Intake/Output Summary (Last 24 hours) at 02/05/15 0900 Last data filed at 02/05/15 0800  Gross per 24 hour  Intake 3054.5 ml  Output   2425 ml  Net  629.5 ml    PHYSICAL EXAMINATION: General:  Awake, oriented. HENT: Moist mucus membranes.  PULM: Clear anteriorly. No wheeze or crackles. CV: RRR, No MRG GI: + BS, JP  drain in place  LABS:  CBC  Recent Labs Lab 02/01/15 0355 02/02/15 0610 02/03/15 0500  WBC 11.6* 26.4* 31.5*  HGB 10.8* 12.0 11.2*  HCT 32.7* 38.1 35.1*  PLT 329 392 447*   Coag's No results for input(s): APTT, INR in the last 168 hours.   BMET  Recent Labs Lab 02/01/15 1530 02/02/15 0610 02/03/15 0755  NA 146* 137 142  K 3.0* 3.4* 3.4*  CL 114* 104 105  CO2 26 23 28   BUN 23* 12 9  CREATININE 0.80 0.63 0.57  GLUCOSE 66 218* 104*   Electrolytes  Recent Labs Lab 02/01/15 0355 02/01/15 1530 02/02/15 0610 02/03/15 0755 02/04/15 1609  CALCIUM 7.7* 7.3* 7.4* 7.2*  --   MG  --   --   --   --  0.9*  PHOS 1.9*  --   --   --  2.9   Sepsis Markers  Recent Labs Lab 01/31/15 1136  02/01/15 0355 02/01/15 1530 02/01/15 1800 02/02/15 0610 02/02/15 1318  LATICACIDVEN  --   < >  --  1.6 1.4  --  1.22  PROCALCITON 29.70  --  12.21  --   --  5.23  --   < > = values in this interval not displayed. ABG  Recent Labs Lab 01/31/15 2137 02/01/15 1525  PHART 7.334* 7.442  PCO2ART 47.9* 33.9*  PO2ART 74.1* 89.2     Liver Enzymes  Recent Labs Lab 01/31/15 1136 02/01/15 0355  AST 28 29  ALT 18 12*  ALKPHOS 81 49  BILITOT 1.0 0.5  ALBUMIN 2.8* 2.1*   Cardiac Enzymes  Recent Labs Lab 02/01/15 1530 02/01/15 2110 02/02/15 0311  TROPONINI 0.13* 0.10* 0.08*     Glucose  Recent Labs Lab 02/04/15 0455 02/04/15 0832 02/04/15 1220 02/04/15 1634 02/04/15 2024 02/05/15 0026  GLUCAP 132* 136* 161* 185* 185* 209*    Imaging  CXR is pending.   ASSESSMENT / PLAN:  PULMONARY OETT 10/19 >  A: Tobacco Abuse  Acute Respiratory failure due to volume overload (acute pulmonary edema) in setting of COPD; doubt acute lung injury/ARDS given volume resuscitation COPD, does not appear to be in exacerbation this morning, unclear baseline severity  P:   Wean dow O2 as tolerated. Diurese. Lasix 40 mg IV qd PRN albuterol + scheduled duoneb Smoking  cessation counseling after extubation   CARDIOVASCULAR PICC ordered 10/20 A:  Shock> volume resuscitated, lactic acid normal; she has been septic but 10/21 propofol and afib seem to be contributing to shock Afib > now rate controlled Pericardial Effusion - hx of, noted on CT Chest 08/2014, appears enlarged on admit CXR but CT comparison unchanged Acute pulmonary edema HLD  P:  Off neo Diurese Hold home lisinopril, lovastatin Amio for afib  RENAL A:   Hypokalemia  P:   Monitor BMET and UOP Replace electrolytes as needed  GASTROINTESTINAL A:   Ruptured Appendix with abscess > s/p appendectomy, lysis of adhesions, washout, and drain placement ileus Cholelithiasis  P:   Post op care per surgery Nutrition per surgery  H2 blocker for stress ulcer prophylaxis See ID   HEMATOLOGIC A:   Leukocytosis - in setting of intra-abdominal process  P:  See ID  Monitor CBC  Heparin sub cutaneously for DVT prophylaxis   INFECTIOUS A:   Sepsis due to ruptured Appendix with pelvic abscess P:   BCx2 10/19 >>  UC 10/19 >>   Cipro, start date 10/19, 1 dose Flagyl, start date 10/19, 1 dose  Imipenem 10/19 >  Vanc 10/19 > 10/21  Follow cultures PCT > 29 > 12 > 5  WBC count higher today but no fevers. She will need a repeat CT of abd pelvis to assess for abd abscess when she is stable from afib, hemodynamic perspective.  All cultures are negative to date.   ENDOCRINE A:   Hypoglycemia resolved DM II   P:   CBG Q4 while NPO  Hold home metformin, glimepiride.  NEUROLOGIC A:   Pain post op Bipolar Disorder  Anxiety > severe Sedation needs for vent synchrony P:   Clonazepam, low dose for severe anxiety (home med) PRN ativan for anxiety   FAMILY  - Updates: Sister Drenda Freeze) and daughters updated on patients status at length 10/22 - Inter-disciplinary family meet or Palliative Care meeting due by:  10/26  CC time 40 minutes today  Marshell Garfinkel MD [place]  Pulmonary and Critical Care Pager [phone] If no answer or after 3pm call: [phone] 02/05/2015, 9:00 AM

## 2015-02-05 NOTE — Progress Notes (Signed)
Date: February 05, 2015 Chart reviewed for concurrent status and case management needs. Will continue to follow patient for changes and needs: Patient with recent abd surg.  Sepsis and hypotension post op requiring iv blouses and iv abx. Velva Harman, RN, BSN, [place]   [phone]

## 2015-02-05 NOTE — Progress Notes (Signed)
5 Days Post-Op  Subjective: No complaints. She is hungry and wants food   Objective: Vital signs in last 24 hours: Temp:  [97.9 F (36.6 C)-98.7 F (37.1 C)] 98.2 F (36.8 C) (10/24 0800) Pulse Rate:  [36-142] 74 (10/24 0700) Resp:  [18-45] 33 (10/24 0700) BP: (62-141)/(28-102) 132/52 mmHg (10/24 0700) SpO2:  [84 %-95 %] 93 % (10/24 0924) FiO2 (%):  [30 %-55 %] 45 % (10/24 0600) Weight:  [83 kg (182 lb 15.7 oz)] 83 kg (182 lb 15.7 oz) (10/24 0447)    Intake/Output from previous day: 10/23 0701 - 10/24 0700 In: 3214.2 [I.V.:2654.2; NG/GT:60; IV Piggyback:500] Out: 2525 [Urine:2500; Drains:25] Intake/Output this shift: Total I/O In: 26.7 [I.V.:26.7] Out: -   Resp: clear to auscultation bilaterally and off vent now Cardio: regular rate and rhythm and on amiodarone GI: soft, minimal tenderness  Lab Results:   Recent Labs  02/03/15 0500 02/05/15 0431  WBC 31.5* 33.1*  HGB 11.2* 10.4*  HCT 35.1* 31.3*  PLT 447* 523*   BMET  Recent Labs  02/03/15 0755 02/05/15 0431  NA 142 147*  K 3.4* 2.4*  CL 105 103  CO2 28 31  GLUCOSE 104* 149*  BUN 9 12  CREATININE 0.57 0.72  CALCIUM 7.2* 7.6*   PT/INR No results for input(s): LABPROT, INR in the last 72 hours. ABG No results for input(s): PHART, HCO3 in the last 72 hours.  Invalid input(s): PCO2, PO2  Studies/Results: No results found.  Anti-infectives: Anti-infectives    Start     Dose/Rate Route Frequency Ordered Stop   02/02/15 1000  vancomycin (VANCOCIN) IVPB 1000 mg/200 mL premix  Status:  Discontinued     1,000 mg 200 mL/hr over 60 Minutes Intravenous Every 12 hours 02/02/15 0947 02/02/15 1049   02/01/15 2000  fluconazole (DIFLUCAN) IVPB 400 mg  Status:  Discontinued     400 mg 100 mL/hr over 120 Minutes Intravenous Every 24 hours 01/31/15 2033 02/04/15 1442   02/01/15 1200  imipenem-cilastatin (PRIMAXIN) 500 mg in sodium chloride 0.9 % 100 mL IVPB     500 mg 200 mL/hr over 30 Minutes Intravenous 4  times per day 02/01/15 1143     02/01/15 1000  vancomycin (VANCOCIN) 1,250 mg in sodium chloride 0.9 % 250 mL IVPB  Status:  Discontinued     1,250 mg 166.7 mL/hr over 90 Minutes Intravenous Every 24 hours 01/31/15 2033 02/02/15 0947   02/01/15 0600  ciprofloxacin (CIPRO) IVPB 400 mg  Status:  Discontinued     400 mg 200 mL/hr over 60 Minutes Intravenous Every 12 hours 01/31/15 1952 01/31/15 2019   01/31/15 2300  vancomycin (VANCOCIN) 1,250 mg in sodium chloride 0.9 % 250 mL IVPB     1,250 mg 166.7 mL/hr over 90 Minutes Intravenous  Once 01/31/15 2033 01/31/15 2353   01/31/15 2300  fluconazole (DIFLUCAN) IVPB 800 mg     800 mg 200 mL/hr over 120 Minutes Intravenous  Once 01/31/15 2033 02/01/15 0024   01/31/15 2200  ciprofloxacin (CIPRO) IVPB 400 mg  Status:  Discontinued     400 mg 200 mL/hr over 60 Minutes Intravenous Every 12 hours 01/31/15 1559 01/31/15 1952   01/31/15 2200  metroNIDAZOLE (FLAGYL) IVPB 500 mg  Status:  Discontinued     500 mg 100 mL/hr over 60 Minutes Intravenous Every 8 hours 01/31/15 1952 01/31/15 2019   01/31/15 2200  imipenem-cilastatin (PRIMAXIN) 500 mg in sodium chloride 0.9 % 100 mL IVPB  Status:  Discontinued  500 mg 200 mL/hr over 30 Minutes Intravenous 3 times per day 01/31/15 2033 02/01/15 1143   01/31/15 2000  metroNIDAZOLE (FLAGYL) IVPB 500 mg  Status:  Discontinued     500 mg 100 mL/hr over 60 Minutes Intravenous 3 times per day 01/31/15 1559 01/31/15 1952   01/31/15 1800  metroNIDAZOLE (FLAGYL) IVPB 500 mg  Status:  Discontinued     500 mg 100 mL/hr over 60 Minutes Intravenous 4 times per day 01/31/15 1543 01/31/15 1559   01/31/15 1215  metroNIDAZOLE (FLAGYL) IVPB 500 mg     500 mg 100 mL/hr over 60 Minutes Intravenous  Once 01/31/15 1201 01/31/15 1350   01/31/15 1100  cefTRIAXone (ROCEPHIN) 1 g in dextrose 5 % 50 mL IVPB     1 g 100 mL/hr over 30 Minutes Intravenous  Once 01/31/15 1051 01/31/15 1230      Assessment/Plan: s/p  Procedure(s): LYSIS OF ADHESIONS /APPENDECTOMY LAPAROSCOPIC  (N/A) Continue ng and bowel rest  Continue primaxin Agree with CT given her persistent leukocytosis. She has a history of this but no to this level.  LOS: 5 days    TOTH III,Jillien Yakel S 02/05/2015

## 2015-02-05 NOTE — Progress Notes (Signed)
CRITICAL VALUE ALERT  Critical value received:  K - 2.7  Date of notification:  02/05/15  Time of notification:  17:00  Critical value read back:Yes.    Nurse who received alert:  Juel Burrow, RN  MD notified (1st page): e link Time of first page:  17:00  MD notified (2nd page):  Time of second page:  Responding MD:  elink  Time MD responded:  17:00

## 2015-02-05 NOTE — Progress Notes (Signed)
ANTIBIOTIC CONSULT NOTE - follow-up  Pharmacy Consult for imipenem/vancomycin/fluconazole Indication: Rupture appendix with pelvic abscesses  Allergies  Allergen Reactions  . Penicillins Hives and Swelling    Has patient had a PCN reaction causing immediate rash, facial/tongue/throat swelling, SOB or lightheadedness with hypotension: Yes Has patient had a PCN reaction causing severe rash involving mucus membranes or skin necrosis: Yes Has patient had a PCN reaction that required hospitalization Yes- hospital for 7 days Has patient had a PCN reaction occurring within the last 10 years: No If all of the above answers are "NO", then may proceed with Cephalosporin use.     Patient Measurements: Height: 5\' 10"  (177.8 cm) Weight: 182 lb 15.7 oz (83 kg) IBW/kg (Calculated) : 68.5 Adjusted Body Weight:   Vital Signs: Temp: 98.2 F (36.8 C) (10/24 1200) Temp Source: Oral (10/24 1200) BP: 132/52 mmHg (10/24 0700) Pulse Rate: 74 (10/24 0700) Intake/Output from previous day: 10/23 0701 - 10/24 0700 In: 3214.2 [I.V.:2654.2; NG/GT:60; IV Piggyback:500] Out: 2525 [Urine:2500; Drains:25] Intake/Output from this shift: Total I/O In: 336.2 [I.V.:186.2; IV Piggyback:150] Out: 650 [Urine:650]  Labs:  Recent Labs  02/03/15 0500 02/03/15 0755 02/05/15 0431 02/05/15 1054  WBC 31.5*  --  33.1* 28.9*  HGB 11.2*  --  10.4* 8.9*  PLT 447*  --  523* 547*  CREATININE  --  0.57 0.72 0.68   Estimated Creatinine Clearance: 78.9 mL/min (by C-G formula based on Cr of 0.68). No results for input(s): VANCOTROUGH, VANCOPEAK, VANCORANDOM, GENTTROUGH, GENTPEAK, GENTRANDOM, TOBRATROUGH, TOBRAPEAK, TOBRARND, AMIKACINPEAK, AMIKACINTROU, AMIKACIN in the last 72 hours.   Microbiology: Recent Results (from the past 720 hour(s))  Blood Culture (routine x 2)     Status: None (Preliminary result)   Collection Time: 01/31/15 11:50 AM  Result Value Ref Range Status   Specimen Description BLOOD LEFT  ANTECUBITAL  Final   Special Requests BOTTLES DRAWN AEROBIC AND ANAEROBIC 5CC  Final   Culture   Final    NO GROWTH 4 DAYS Performed at [facility]    Report Status PENDING  Incomplete  Blood Culture (routine x 2)     Status: None (Preliminary result)   Collection Time: 01/31/15 12:02 PM  Result Value Ref Range Status   Specimen Description BLOOD LEFT HAND  Final   Special Requests IN PEDIATRIC BOTTLE [place]  Final   Culture   Final    NO GROWTH 4 DAYS Performed at [facility]    Report Status PENDING  Incomplete  Urine culture     Status: None   Collection Time: 01/31/15  2:00 PM  Result Value Ref Range Status   Specimen Description URINE, CATHETERIZED  Final   Special Requests NONE  Final   Culture   Final    NO GROWTH 1 DAY Performed at [facility]    Report Status 02/01/2015 FINAL  Final  MRSA PCR Screening     Status: None   Collection Time: 02/01/15 12:32 AM  Result Value Ref Range Status   MRSA by PCR NEGATIVE NEGATIVE Final    Comment:        The GeneXpert MRSA Assay (FDA approved for NASAL specimens only), is one component of a comprehensive MRSA colonization surveillance program. It is not intended to diagnose MRSA infection nor to guide or monitor treatment for MRSA infections.   Culture, blood (routine x 2)     Status: None (Preliminary result)   Collection Time: 02/03/15  1:00 PM  Result Value Ref Range Status  Specimen Description BLOOD LEFT ANTECUBITAL  Final   Special Requests IN PEDIATRIC BOTTLE [place]  Final   Culture   Final    NO GROWTH < 24 HOURS Performed at [facility]    Report Status PENDING  Incomplete  Culture, Urine     Status: None   Collection Time: 02/03/15  1:00 PM  Result Value Ref Range Status   Specimen Description URINE, CATHETERIZED  Final   Special Requests NONE  Final   Culture   Final    NO GROWTH 2 DAYS Performed at [facility]    Report Status 02/05/2015 FINAL  Final    Medical  History: Past Medical History  Diagnosis Date  . Leukocytosis   . COPD (chronic obstructive pulmonary disease) ([place])   . Allergic rhinitis   . DM (diabetes mellitus) ([place])   . Bipolar affective disorder ([place])   . Hyperlipidemia   . Smoking   . Cough 10/30/2011  . SOB (shortness of breath) 10/30/2011  . Mediastinal adenopathy 11/26/2011   Assessment: 28 YOF presents with abd pain and N/V.  Found to have ruptured appendix and multiple abscesses.  Pharmacy asked to dose ciprofloxacin (ceftriaxone stopped for PCN allergy) and metronidazole. Patient taken to OR (no note available at this time), several antibiotic changes ordered but spoke with PCCM for clarification.    Ceftriaxone x1 in ED  10/19 >> cipro >> 10/19 10/19 >> metronidazole >> 10/19 10/19 >> primaxin >> 10/19 >> vancomycin >> 10/21 10/19 >> fluconazole >> 10/23  PCT trending down  10/19 urine: NG 10/19 blood: NGTD 10/22 blood: NGTD 10/22 urine (UA neg):   Today, 02/05/2015: Temp: Afebrile x 24 h; previous fevers to 101 WBC: markedly elevated/stable Renal: SCr stable wnl; CrCl 79 CG Will need repeat abd CT once hemodynamically stable  Goal of Therapy:  Dose for indication and for patient-specific parameters  Plan:  Day #6 abx  Continue Primaxin 500mg  IV q6h  F/u renal function, clinical course; LOT/de-escalation  F/u CT results   Reuel Boom, PharmD, BCPS Pager: [phone] 02/05/2015, 2:33 PM

## 2015-02-05 NOTE — Progress Notes (Signed)
   02/05/15 1000  Clinical Encounter Type  Visited With Patient  Visit Type Initial;Psychological support;Spiritual support;Critical Care  Spiritual Encounters  Spiritual Needs Prayer;Emotional;Other (Comment) Academic librarian)  Stress Factors  Patient Stress Factors Health changes  Family Stress Factors None identified   Chaplain visited with patient to follow-up from a previous visit from one of our contract Chaplains over the weekend. The patient was awake upon the Chaplain's arrival and holding a cup in her hand. The patient was visibly weak and spoke in a low tone, but was receptive to the Chaplain's visit.  The patient requested prayer for healing. The patient requested that I also pray that she could get out of bed and eat. The patient, Mercedes Thornton, stated that she wasn't doing well today.  The Chaplain and the nurse prayed with the patient at bedside. The patient would like follow-up visits from the [place]. Chaplain interventions included pastoral conversation, emotional/psychological support and prayer.  The Chaplain will follow up with the patient at a later time.

## 2015-02-06 ENCOUNTER — Inpatient Hospital Stay (HOSPITAL_COMMUNITY): Payer: Medicare Other

## 2015-02-06 LAB — CBC
HEMATOCRIT: 32.2 % — AB (ref 36.0–46.0)
Hemoglobin: 10.4 g/dL — ABNORMAL LOW (ref 12.0–15.0)
MCH: 28.3 pg (ref 26.0–34.0)
MCHC: 32.3 g/dL (ref 30.0–36.0)
MCV: 87.5 fL (ref 78.0–100.0)
Platelets: 567 10*3/uL — ABNORMAL HIGH (ref 150–400)
RBC: 3.68 MIL/uL — ABNORMAL LOW (ref 3.87–5.11)
RDW: 15.1 % (ref 11.5–15.5)
WBC: 23.3 10*3/uL — ABNORMAL HIGH (ref 4.0–10.5)

## 2015-02-06 LAB — BASIC METABOLIC PANEL
Anion gap: 13 (ref 5–15)
BUN: 9 mg/dL (ref 6–20)
CALCIUM: 7.8 mg/dL — AB (ref 8.9–10.3)
CO2: 33 mmol/L — AB (ref 22–32)
CREATININE: 0.68 mg/dL (ref 0.44–1.00)
Chloride: 104 mmol/L (ref 101–111)
GFR calc Af Amer: >60 mL/min (ref >60)
GFR calc non Af Amer: >60 mL/min (ref >60)
GLUCOSE: 148 mg/dL — AB (ref 65–99)
Potassium: 3.1 mmol/L — ABNORMAL LOW (ref 3.5–5.1)
Sodium: 150 mmol/L — ABNORMAL HIGH (ref 135–145)

## 2015-02-06 LAB — GLUCOSE, CAPILLARY
GLUCOSE-CAPILLARY: 140 mg/dL — AB (ref 65–99)
GLUCOSE-CAPILLARY: 146 mg/dL — AB (ref 65–99)
GLUCOSE-CAPILLARY: 151 mg/dL — AB (ref 65–99)
GLUCOSE-CAPILLARY: 176 mg/dL — AB (ref 65–99)
GLUCOSE-CAPILLARY: 191 mg/dL — AB (ref 65–99)

## 2015-02-06 LAB — PHOSPHORUS: Phosphorus: 2.6 mg/dL (ref 2.5–4.6)

## 2015-02-06 LAB — MAGNESIUM: Magnesium: 2.1 mg/dL (ref 1.7–2.4)

## 2015-02-06 MED ORDER — FUROSEMIDE 10 MG/ML IJ SOLN
40.0000 mg | Freq: Two times a day (BID) | INTRAMUSCULAR | Status: DC
  Administered 2015-02-06 – 2015-02-08 (×6): 40 mg via INTRAVENOUS
  Filled 2015-02-06 (×6): qty 4

## 2015-02-06 MED ORDER — CHLORHEXIDINE GLUCONATE 0.12 % MT SOLN
OROMUCOSAL | Status: AC
  Filled 2015-02-06: qty 15

## 2015-02-06 MED ORDER — POTASSIUM CHLORIDE 10 MEQ/50ML IV SOLN
10.0000 meq | INTRAVENOUS | Status: AC
  Administered 2015-02-06 (×5): 10 meq via INTRAVENOUS
  Filled 2015-02-06 (×9): qty 50

## 2015-02-06 MED ORDER — IPRATROPIUM-ALBUTEROL 0.5-2.5 (3) MG/3ML IN SOLN
3.0000 mL | Freq: Three times a day (TID) | RESPIRATORY_TRACT | Status: DC
  Administered 2015-02-06 – 2015-02-13 (×22): 3 mL via RESPIRATORY_TRACT
  Filled 2015-02-06 (×20): qty 3

## 2015-02-06 NOTE — Progress Notes (Signed)
PULMONARY / CRITICAL CARE MEDICINE   Name: Mercedes Thornton MRN: [id number] DOB: 04/13/1946    ADMISSION DATE:  01/31/2015 CONSULTATION DATE:  01/31/15  REFERRING MD :  Dr. Tyrone Nine / EDP   CHIEF COMPLAINT:  Ruptured Appendix   INITIAL PRESENTATION: 69 y/o F, current smoker, with a PMH of COPD, DM, HLD, who presented to [facility] on 10/19 with nausea/vomiting, abdominal pain, chills and productive cough.  Found to have a ruptured appendix with developing pelvic abscess. PCCM consulted for admission.    STUDIES:  10/19 CT ABD / Pelvis >> findings most compatible with ruptured appendicitis with developing pelvic abscess, moderate small bowel dilation concerning for ileus rather than obstruction, cholelithiasis, unchanged pericardial effusion, distal esophageal thickening. 10/24 CT Abd/Pelvis >> Loculated fluids in pelvis ? Abscess. Inflammatory changes in pelvis ? Peritonitis.   SIGNIFICANT EVENTS: 10/19  Admit with ruptured appendix> taken to OR, abscess drained, appendectomy (ruptured, appendolith noted), lysis of adhesions, closed 10/20 failed SBT this morning, Afib with RVR, hypoxemia, placed on cardizem drip. 1024. Extrubated. Afib >> started on amiodarone drip.    SUBJECTIVE: Feels well.  VITAL SIGNS: Temp:  [97.4 F (36.3 C)-98.5 F (36.9 C)] 98.1 F (36.7 C) (10/25 0759) Pulse Rate:  [78-101] 85 (10/25 0600) Resp:  [20-42] 35 (10/25 0600) BP: (128-167)/(49-103) 158/59 mmHg (10/25 0600) SpO2:  [87 %-95 %] 93 % (10/25 0600) FiO2 (%):  [50 %-55 %] 55 % (10/25 0400) Weight:  [175 lb 4.3 oz (79.5 kg)] 175 lb 4.3 oz (79.5 kg) (10/25 0500)   HEMODYNAMICS:     VENTILATOR SETTINGS: Vent Mode:  [-]  FiO2 (%):  [50 %-55 %] 55 %   INTAKE / OUTPUT:  Intake/Output Summary (Last 24 hours) at 02/06/15 0851 Last data filed at 02/06/15 0800  Gross per 24 hour  Intake 2343.67 ml  Output   1685 ml  Net 658.67 ml    PHYSICAL EXAMINATION: General:  Awake, oriented. HENT: Moist mucus  membranes.  PULM: Clear anteriorly. No wheeze or crackles. CV: RRR, No MRG GI: + BS, JP drain in place  LABS:  CBC  Recent Labs Lab 02/05/15 0431 02/05/15 1054 02/06/15 0500  WBC 33.1* 28.9* 23.3*  HGB 10.4* 8.9* 10.4*  HCT 31.3* 27.0* 32.2*  PLT 523* 547* 567*   Coag's No results for input(s): APTT, INR in the last 168 hours.   BMET  Recent Labs Lab 02/05/15 1420 02/05/15 1615 02/06/15 0500  NA 146* 146* 150*  K 6.5* 2.7* 3.1*  CL 106 100* 104  CO2 30 31 33*  BUN 10 11 9   CREATININE 0.75 0.77 0.68  GLUCOSE 197* 198* 148*   Electrolytes  Recent Labs Lab 02/05/15 0431 02/05/15 1054 02/05/15 1420 02/05/15 1615 02/06/15 0500  CALCIUM 7.6* 6.6* 6.8* 7.6* 7.8*  MG 1.8 1.2*  --  1.3* 2.1  PHOS 3.1 2.3*  --   --  2.6   Sepsis Markers  Recent Labs Lab 01/31/15 1136  02/01/15 0355 02/01/15 1530 02/01/15 1800 02/02/15 0610 02/02/15 1318  LATICACIDVEN  --   < >  --  1.6 1.4  --  1.22  PROCALCITON 29.70  --  12.21  --   --  5.23  --   < > = values in this interval not displayed. ABG  Recent Labs Lab 01/31/15 2137 02/01/15 1525  PHART 7.334* 7.442  PCO2ART 47.9* 33.9*  PO2ART 74.1* 89.2     Liver Enzymes  Recent Labs Lab 01/31/15 1136 02/01/15 0355  AST 28 29  ALT 18 12*  ALKPHOS 81 49  BILITOT 1.0 0.5  ALBUMIN 2.8* 2.1*   Cardiac Enzymes  Recent Labs Lab 02/01/15 1530 02/01/15 2110 02/02/15 0311  TROPONINI 0.13* 0.10* 0.08*     Glucose  Recent Labs Lab 02/05/15 1222 02/05/15 1553 02/05/15 2018 02/05/15 2351 02/06/15 0347 02/06/15 0734  GLUCAP 173* 182* 146* 151* 146* 140*    Imaging  CXR is pending.   ASSESSMENT / PLAN:  PULMONARY OETT 10/19 >  A: Tobacco Abuse  Acute Respiratory failure due to volume overload (acute pulmonary edema) in setting of COPD; doubt acute lung injury/ARDS given volume resuscitation COPD, does not appear to be in exacerbation this morning, unclear baseline severity  P:   Wean dow  O2 as tolerated. Diurese. Lasix 40 mg IV bid PRN albuterol + scheduled duoneb Smoking cessation counseling after extubation   CARDIOVASCULAR PICC ordered 10/20 A:  Shock> volume resuscitated, lactic acid normal; she has been septic but 10/21 propofol and afib seem to be contributing to shock Afib > now rate controlled Pericardial Effusion - hx of, noted on CT Chest 08/2014, appears enlarged on admit CXR but CT comparison unchanged Acute pulmonary edema HLD  P:  Diurese Hold home lisinopril, lovastatin Amio for afib  RENAL A:   Hypokalemia  P:   Monitor BMET and UOP Replace electrolytes as needed  GASTROINTESTINAL A:   Ruptured Appendix with abscess > s/p appendectomy, lysis of adhesions, washout, and drain placement ileus Cholelithiasis  P:   Post op care per surgery Nutrition per surgery  H2 blocker for stress ulcer prophylaxis See ID   HEMATOLOGIC A:   Leukocytosis - in setting of intra-abdominal process  P:  See ID  Monitor CBC  Heparin sub cutaneously for DVT prophylaxis   INFECTIOUS A:   Sepsis due to ruptured Appendix with pelvic abscess P:   BCx2 10/19 >>  UC 10/19 >>   Cipro, start date 10/19, 1 dose Flagyl, start date 10/19, 1 dose  Imipenem 10/19 >  Vanc 10/19 > 10/21  Follow cultures PCT > 29 > 12 > 5  CT scan of abd shows a fluid collection concerning for abscess. But WBC count continues to improve. Will defer to surgery if this needs additional drainage.   ENDOCRINE A:   Hypoglycemia resolved DM II   P:   CBG Q4 while NPO  Hold home metformin, glimepiride.  NEUROLOGIC A:   Pain post op Bipolar Disorder  Anxiety > severe Sedation needs for vent synchrony P:   Clonazepam, low dose for severe anxiety (home med) PRN ativan for anxiety   FAMILY  - Updates: Sister Drenda Freeze) and daughters updated on patients status at length 10/22 - Inter-disciplinary family meet or Palliative Care meeting due by:  10/26  CC time 40 minutes  today  Marshell Garfinkel MD [place] [facility] Pager [phone] If no answer or after 3pm call: [phone] 02/06/2015, 8:51 AM

## 2015-02-06 NOTE — Evaluation (Signed)
Physical Therapy Evaluation Patient Details Name: Mercedes Thornton MRN: [id number] DOB: 02-22-1946 Today's Date: 02/06/2015   History of Present Illness  69 year old female, current smoker, with a PMH of COPD, DM, HLD, who presented to [facility] on 10/19 with nausea/vomiting, abdominal pain, chills and productive cough. Found to have a ruptured appendix with developing pelvic abscess and s/p appendectomy 10/19.  ETT 10/19 - 10/24 and developed Afib so started on amiodarone drip  Clinical Impression  Pt admitted with above diagnosis. Pt currently with functional limitations due to the deficits listed below (see PT Problem List).  Pt will benefit from skilled PT to increase their independence and safety with mobility to allow discharge to the venue listed below.   Pt with multiple lines and leads today including 6L O2 [place] however anticipate progressing to home with HHPT and daughter reports being able to assist.     Follow Up Recommendations Home health PT;Supervision for mobility/OOB    Equipment Recommendations  Rolling walker with 5" wheels    Recommendations for Other Services       Precautions / Restrictions Precautions Precautions: Fall Precaution Comments: anterior R drain, monitor sats      Mobility  Bed Mobility Overal bed mobility: Needs Assistance;+ 2 for safety/equipment Bed Mobility: Supine to Sit;Sit to Supine     Supine to sit: Mod assist Sit to supine: Mod assist   General bed mobility comments: assist for scooting hip and raising trunk upright, assist for LEs onto bed due to abdominal pain with transfers  Transfers Overall transfer level: Needs assistance Equipment used: Rolling walker (2 wheeled) Transfers: Sit to/from Stand Sit to Stand: Min assist         General transfer comment: verbal cues for safe technique  Ambulation/Gait Ambulation/Gait assistance: Min assist Ambulation Distance (Feet): 20 Feet Assistive device: Rolling walker (2 wheeled) Gait  Pattern/deviations: Step-through pattern;Decreased stride length     General Gait Details: slow pace, pt limiting distance due to fatigue, remained on 6L O2 [place] and SPO2 90-91% during gait  Stairs            Wheelchair Mobility    Modified Rankin (Stroke Patients Only)       Balance                                             Pertinent Vitals/Pain Pain Assessment: 0-10 Pain Score: 7  Pain Location: surgical site Pain Descriptors / Indicators: Sore Pain Intervention(s): Limited activity within patient's tolerance;Monitored during session;Repositioned  HR 91 bpm and SPO2 94% 6L O2 upon end of session    Home Living Family/patient expects to be discharged to:: Private residence Living Arrangements: Alone Available Help at Discharge: Family;Available 24 hours/day (daughters plan to assist)   Home Access: Level entry     Home Layout: One level Home Equipment: None      Prior Function Level of Independence: Independent               Hand Dominance        Extremity/Trunk Assessment               Lower Extremity Assessment: Generalized weakness      Cervical / Trunk Assessment: Normal  Communication   Communication: No difficulties  Cognition Arousal/Alertness: Awake/alert Behavior During Therapy: WFL for tasks assessed/performed Overall Cognitive Status: Within Functional Limits for tasks assessed  General Comments      Exercises        Assessment/Plan    PT Assessment Patient needs continued PT services  PT Diagnosis Difficulty walking;Acute pain   PT Problem List Decreased strength;Decreased activity tolerance;Decreased mobility;Pain;Cardiopulmonary status limiting activity;Decreased knowledge of use of DME  PT Treatment Interventions DME instruction;Gait training;Functional mobility training;Patient/family education;Therapeutic activities;Therapeutic exercise   PT Goals (Current goals  can be found in the Care Plan section) Acute Rehab PT Goals PT Goal Formulation: With patient Time For Goal Achievement: 02/13/15 Potential to Achieve Goals: Good    Frequency Min 3X/week   Barriers to discharge        Co-evaluation               End of Session Equipment Utilized During Treatment: Oxygen Activity Tolerance: Patient limited by fatigue Patient left: in bed;with call bell/phone within reach;with family/visitor present Nurse Communication: Mobility status         Time: 8270-7867 PT Time Calculation (min) (ACUTE ONLY): 27 min   Charges:   PT Evaluation $Initial PT Evaluation Tier I: 1 Procedure PT Treatments $Gait Training: 8-22 mins   PT G Codes:        Lucero Ide,KATHrine E 02/06/2015, 4:24 PM Carmelia Bake, PT, DPT 02/06/2015 Pager: [phone]

## 2015-02-06 NOTE — Progress Notes (Deleted)
[facility] ADULT ICU REPLACEMENT PROTOCOL FOR AM LAB REPLACEMENT ONLY  The patient does apply for the [facility] Adult ICU Electrolyte Replacment Protocol based on the criteria listed below:   1. Is GFR >/= 40 ml/min? Yes.    Patient's GFR today is >60 2. Is urine output >/= 0.5 ml/kg/hr for the last 6 hours? Yes.   Patient's UOP is 0.9 ml/kg/hr 3. Is BUN < 60 mg/dL? Yes.    Patient's BUN today is 9 4. Abnormal electrolyte  K 3.1 5. Ordered repletion with: per protocol 6. If a panic level lab has been reported, has the CCM MD in charge been notified? Yes.  .   Physician:  Philbert Riser 02/06/2015 5:45 AM

## 2015-02-06 NOTE — Progress Notes (Signed)
[place] Progress Note Patient Name: Mercedes Thornton DOB: 1945-08-27 MRN: [id number]   Date of Service  02/06/2015  HPI/Events of Note  RN reports frequent, watery stools.  She has elevated WBC.     eICU Interventions  Will order C diff PCR.     Intervention Category Major Interventions: Other:  Mikayah Joy 02/06/2015, 10:30 PM

## 2015-02-06 NOTE — Progress Notes (Signed)
ANTIBIOTIC CONSULT NOTE - follow-up  Pharmacy Consult for imipenem/vancomycin/fluconazole Indication: Rupture appendix with pelvic abscesses  Allergies  Allergen Reactions  . Penicillins Hives and Swelling    Has patient had a PCN reaction causing immediate rash, facial/tongue/throat swelling, SOB or lightheadedness with hypotension: Yes Has patient had a PCN reaction causing severe rash involving mucus membranes or skin necrosis: Yes Has patient had a PCN reaction that required hospitalization Yes- hospital for 7 days Has patient had a PCN reaction occurring within the last 10 years: No If all of the above answers are "NO", then may proceed with Cephalosporin use.     Patient Measurements: Height: 5\' 10"  (177.8 cm) Weight: 175 lb 4.3 oz (79.5 kg) IBW/kg (Calculated) : 68.5 Adjusted Body Weight:   Vital Signs: Temp: 98.1 F (36.7 C) (10/25 1210) Temp Source: Oral (10/25 1210) BP: 145/58 mmHg (10/25 1300) Pulse Rate: 85 (10/25 1300) Intake/Output from previous day: 10/24 0701 - 10/25 0700 In: 2253.7 [I.V.:1453.7; IV Piggyback:800] Out: 1751 [Urine:1485; Emesis/NG output:200] Intake/Output from this shift: Total I/O In: 760.2 [I.V.:410.2; IV Piggyback:350] Out: 525 [Urine:450; Emesis/NG output:75]  Labs:  Recent Labs  02/05/15 0431 02/05/15 1054 02/05/15 1420 02/05/15 1615 02/06/15 0500  WBC 33.1* 28.9*  --   --  23.3*  HGB 10.4* 8.9*  --   --  10.4*  PLT 523* 547*  --   --  567*  CREATININE 0.72 0.68 0.75 0.77 0.68   Estimated Creatinine Clearance: 72.8 mL/min (by C-G formula based on Cr of 0.68). No results for input(s): VANCOTROUGH, VANCOPEAK, VANCORANDOM, GENTTROUGH, GENTPEAK, GENTRANDOM, TOBRATROUGH, TOBRAPEAK, TOBRARND, AMIKACINPEAK, AMIKACINTROU, AMIKACIN in the last 72 hours.   Microbiology: Recent Results (from the past 720 hour(s))  Blood Culture (routine x 2)     Status: None   Collection Time: 01/31/15 11:50 AM  Result Value Ref Range Status   Specimen Description BLOOD LEFT ANTECUBITAL  Final   Special Requests BOTTLES DRAWN AEROBIC AND ANAEROBIC 5CC  Final   Culture   Final    NO GROWTH 5 DAYS Performed at [facility]    Report Status 02/05/2015 FINAL  Final  Blood Culture (routine x 2)     Status: None   Collection Time: 01/31/15 12:02 PM  Result Value Ref Range Status   Specimen Description BLOOD LEFT HAND  Final   Special Requests IN PEDIATRIC BOTTLE [place]  Final   Culture   Final    NO GROWTH 5 DAYS Performed at [facility]    Report Status 02/05/2015 FINAL  Final  Urine culture     Status: None   Collection Time: 01/31/15  2:00 PM  Result Value Ref Range Status   Specimen Description URINE, CATHETERIZED  Final   Special Requests NONE  Final   Culture   Final    NO GROWTH 1 DAY Performed at [facility]    Report Status 02/01/2015 FINAL  Final  MRSA PCR Screening     Status: None   Collection Time: 02/01/15 12:32 AM  Result Value Ref Range Status   MRSA by PCR NEGATIVE NEGATIVE Final    Comment:        The GeneXpert MRSA Assay (FDA approved for NASAL specimens only), is one component of a comprehensive MRSA colonization surveillance program. It is not intended to diagnose MRSA infection nor to guide or monitor treatment for MRSA infections.   Culture, blood (routine x 2)     Status: None (Preliminary result)   Collection Time: 02/03/15  1:00 PM  Result Value Ref Range Status   Specimen Description BLOOD LEFT ANTECUBITAL  Final   Special Requests IN PEDIATRIC BOTTLE [place]  Final   Culture   Final    NO GROWTH 2 DAYS Performed at [facility]    Report Status PENDING  Incomplete  Culture, Urine     Status: None   Collection Time: 02/03/15  1:00 PM  Result Value Ref Range Status   Specimen Description URINE, CATHETERIZED  Final   Special Requests NONE  Final   Culture   Final    NO GROWTH 2 DAYS Performed at [facility]    Report Status 02/05/2015 FINAL   Final    Medical History: Past Medical History  Diagnosis Date  . Leukocytosis   . COPD (chronic obstructive pulmonary disease) ([place])   . Allergic rhinitis   . DM (diabetes mellitus) ([place])   . Bipolar affective disorder ([place])   . Hyperlipidemia   . Smoking   . Cough 10/30/2011  . SOB (shortness of breath) 10/30/2011  . Mediastinal adenopathy 11/26/2011   Assessment: 22 YOF presents with abd pain and N/V.  Found to have ruptured appendix and multiple abscesses.  Pharmacy asked to dose ciprofloxacin (ceftriaxone stopped for PCN allergy) and metronidazole. Patient taken to OR (no note available at this time), several antibiotic changes ordered but spoke with PCCM for clarification.    Ceftriaxone x1 in ED  10/19 >> cipro >> 10/19 10/19 >> metronidazole >> 10/19 10/19 >> primaxin >> 10/19 >> vancomycin >> 10/21 10/19 >> fluconazole >> 10/23  PCT trending down  10/19 urine: NG 10/19 blood: NGTD 10/22 blood: NGTD 10/22 urine (UA neg):   Today, 02/06/2015: Temp: Afebrile x 24 h; previous fevers to 101 WBC: trending down to 23.3 Renal: SCr stable wnl; CrCl 73 CG  Repeat CT shows post appendectomy with scattered inflammatory changes in the pelvis, including bowel wall thickening and enhancement of multiple small bowel loops as well has a loculated fluid collection.  Goal of Therapy:  Dose for indication and for patient-specific parameters  Plan:  Day #7 abx  Continue Primaxin 500mg  IV q6h.   F/u renal function, clinical course; LOT/de-escalation  F/u CT results   Annie Sable, PharmD Candidate 02/06/2015, 1:59 PM

## 2015-02-06 NOTE — Progress Notes (Signed)
Patient was on 6 L [place] of oxygen however oxygen saturation began to decrease to 86-88% at rest. Switched to venturi mask at FiO2 of 55% at 2140. Saturations increased to 90% and above. Patient took off venturi mask. Placed venturi mask back on. Educated patient on the importance of keeping venturi mask on. Oxygen saturation was sustaining in mid to upper 80's at rest. Switched patient to non-rebreather mask. Oxygen saturations increased to 91% and above. Pt.denies shortness of breath, doesn't appear to be in acute respiratory distress. Pt.denies pain. Called and notified respiratory therapy to update.

## 2015-02-06 NOTE — Progress Notes (Addendum)
Date: February 06, 2015 Chart reviewed for concurrent status and case management needs. Will continue to follow patient for changes and needs: Fio2 at 55%/poss abd cavity abcess/iv amiodarone drip Velva Harman, RN, BSN, [place]   [phone]

## 2015-02-06 NOTE — Progress Notes (Signed)
6 Days Post-Op  Subjective: She is up in the chair, and off vent, NG in place, not draining allot.  Film yesterday shows she still has an ileus. She reports passing some flatus. Objective: Vital signs in last 24 hours: Temp:  [97.4 F (36.3 C)-98.5 F (36.9 C)] 98.1 F (36.7 C) (10/25 0759) Pulse Rate:  [78-101] 85 (10/25 0600) Resp:  [20-42] 35 (10/25 0600) BP: (128-167)/(49-103) 158/59 mmHg (10/25 0600) SpO2:  [87 %-95 %] 93 % (10/25 0600) FiO2 (%):  [50 %-55 %] 55 % (10/25 0400) Weight:  [79.5 kg (175 lb 4.3 oz)] 79.5 kg (175 lb 4.3 oz) (10/25 0500)   NG 200' Urine 1485 Afebrile, RR up in the 30-40 range, Sats down some yesterday, up to the 90 range last PM NA 150 K+ 2.7 to 3.1 this AM, magnesium 2.1 this Am WBC 23.3K CT yesterday shows:  Focal cul-de-sac fluid collection measures up to 8.8 x 1.5 x 3.5 cm in size question abscess. Numerous SB loops with fluid collections CXR this AM:  Persistent left lower lobe consolidation. Small pleural effusions bilaterally with mild interstitial edema. Suspect a degree of underlying congestive heart failure. The lungs and cardiac silhouette appears stable.  Intake/Output from previous day: 10/24 0701 - 10/25 0700 In: 2187 [I.V.:1387; IV Piggyback:800] Out: 5732 [Urine:1485; Emesis/NG output:200] Intake/Output this shift:    General appearance: alert, cooperative and no distress Resp: BS down in both bases. Cardio: regular and tachycardic GI: no BS, port sites look fine, not much from the NG  Lab Results:   Recent Labs  02/05/15 1054 02/06/15 0500  WBC 28.9* 23.3*  HGB 8.9* 10.4*  HCT 27.0* 32.2*  PLT 547* 567*    BMET  Recent Labs  02/05/15 1615 02/06/15 0500  NA 146* 150*  K 2.7* 3.1*  CL 100* 104  CO2 31 33*  GLUCOSE 198* 148*  BUN 11 9  CREATININE 0.77 0.68  CALCIUM 7.6* 7.8*   PT/INR No results for input(s): LABPROT, INR in the last 72 hours.   Recent Labs Lab 01/31/15 1136 02/01/15 0355  AST 28 29   ALT 18 12*  ALKPHOS 81 49  BILITOT 1.0 0.5  PROT 7.5 5.3*  ALBUMIN 2.8* 2.1*     Lipase     Component Value Date/Time   LIPASE 11* 01/31/2015 1136     Studies/Results: Ct Abdomen Pelvis W Contrast  02/05/2015  CLINICAL DATA:  Leukocytosis, post appendectomy and lysis of adhesions on 01/31/2015, underlying COPD, diabetes mellitus, smoker EXAM: CT ABDOMEN AND PELVIS WITH CONTRAST TECHNIQUE: Multidetector CT imaging of the abdomen and pelvis was performed using the standard protocol following bolus administration of intravenous contrast. Sagittal and coronal MPR images reconstructed from axial data set. CONTRAST:  119mL OMNIPAQUE IOHEXOL 300 MG/ML SOLN IV. Oral contrast was not administered. COMPARISON:  01/31/2015 FINDINGS: Bibasilar atelectasis, difficult to exclude mild consolidation in lower lobes. Small pericardial effusion. Liver, spleen, pancreas, pancreas, and adrenal glands normal appearance. Dependent gallstone in gallbladder. Nasogastric tube in stomach. Surgical drain in pelvis and RIGHT lower quadrant. Few small bowel loops superior mildly distended without definite evidence of obstruction. Mild bowel wall thickening of small bowel loops in pelvis with scattered ascites and areas of enhancement question peritonitis Focal cul-de-sac fluid collection measures up to 8.8 x 1.5 x 3.5 cm in size question abscess. Numerous additional small interloop collections of fluid are identified which do not demonstrate the same degree of margination or enhancement Air and Foley catheter within urinary bladder. Unremarkable  uterus and adnexa. Colon decompressed. No mass or adenopathy. Scattered atherosclerotic calcifications without aneurysm. No free air or hernia. Bones unremarkable. IMPRESSION: Post appendectomy with scattered inflammatory changes in the pelvis, which include bowel wall thickening and enhancement of multiple small bowel loops as well has a loculated fluid collection with enhancing  margins in the pelvis/cul de sac 8.8 x 1.5 x 3.5 cm in size question peritonitis with pelvic abscess. Multiple additional small interloop collections of fluid are seen along bowel loops without the similar degree of enhancement or demarcation. Cholelithiasis. Electronically Signed   By: Lavonia Dana M.D.   On: 02/05/2015 15:59   Dg Chest Port 1 View  02/06/2015  CLINICAL DATA:  Respiratory failure EXAM: PORTABLE CHEST 1 VIEW COMPARISON:  February 05, 2015 and February 01, 2015; chest CT Aug 24, 2014 FINDINGS: Central catheter tip is in the superior vena cava. Nasogastric tube tip extends into the stomach. No pneumothorax. There is airspace consolidation in the left base with small left effusion, stable. There is a minimal right effusion. The interstitium is diffusely prominent, likely with a mild degree of interstitial edema. There is no new parenchymal lung opacity. Heart is upper normal in size with pulmonary vascularity within normal limits. Atherosclerotic changes noted in aorta. No adenopathy is appreciable. IMPRESSION: Tube positions as described without pneumothorax. Persistent left lower lobe consolidation. Small pleural effusions bilaterally with mild interstitial edema. Suspect a degree of underlying congestive heart failure. The lungs and cardiac silhouette appears stable compared to 1 day prior. Electronically Signed   By: Lowella Grip III M.D.   On: 02/06/2015 07:20   Dg Chest Port 1 View  02/05/2015  CLINICAL DATA:  Acute respiratory failure EXAM: PORTABLE CHEST - 1 VIEW COMPARISON:  02/03/2015 FINDINGS: Right-sided PICC line and nasogastric catheter are again noted in satisfactory position. The endotracheal tube is been removed in the interval. Poor inspiratory effort is again noted with persistent left basilar opacification. Increasing right basilar atelectasis is noted. IMPRESSION: Poor inspiratory effort of with persistent left basilar changes and new right basilar atelectasis.  Electronically Signed   By: Inez Catalina M.D.   On: 02/05/2015 10:05    Medications: . antiseptic oral rinse  7 mL Mouth Rinse QID  . chlorhexidine gluconate  15 mL Mouth Rinse BID  . clonazepam  1 mg Oral BID  . famotidine (PEPCID) IV  20 mg Intravenous Q12H  . heparin subcutaneous  5,000 Units Subcutaneous 3 times per day  . imipenem-cilastatin  500 mg Intravenous 4 times per day  . insulin aspart  0-9 Units Subcutaneous 6 times per day  . ipratropium-albuterol  3 mL Nebulization TID  . potassium chloride  10 mEq Intravenous Q1 Hr x 5  . sodium chloride  10-40 mL Intracatheter Q12H    Assessment/Plan Perforated appendicitis with extensive adhesions due to prior intraabdominal surgery. S/p Lysis of adhesions for approximately 40 minutes with laparoscopic appendectomy and evacuation of pelvic abscesses, 01/31/15, Dr. Alphonsa Overall  POD 6 Sepsis with hypotension, tachycardia, decreased urine output, Tachypnea with some decreased O2 saturation  Pericardial effusion Post op VDRF/volume overload  Extubated 02/04/15. Post op Atrial fibrillation COPD/ongoing tobacco use/mediastinal adenopathy/Chronic DOE Chronic leukocytosis  AODM with hypoglycemia Bipolar/depression Hyperlipidemia Hypokalemia Antibiotics:  Day 7 Imipenem-cilastatin  She has had 4 days of fluconazole. DVT:  Heparin/SCD   Plan:  She is on ice chips, with the NG, I will check and review CT with Dr. Marlou Starks, see if there is a way we can drain or  aspirate the fluid collection. Appreciate CCM care.      LOS: 6 days    Jailin Moomaw 02/06/2015

## 2015-02-06 NOTE — Progress Notes (Signed)
[place] Progress Note Patient Name: Mercedes Thornton DOB: Jul 02, 1945 MRN: [id number]   Date of Service  02/06/2015  HPI/Events of Note    eICU Interventions  Hypokalemia, repleted      Intervention Category Intermediate Interventions: Electrolyte abnormality - evaluation and management  Simonne Maffucci 02/06/2015, 6:05 AM

## 2015-02-07 ENCOUNTER — Inpatient Hospital Stay (HOSPITAL_COMMUNITY): Payer: Medicare Other

## 2015-02-07 LAB — BASIC METABOLIC PANEL
ANION GAP: 11 (ref 5–15)
BUN: 7 mg/dL (ref 6–20)
CHLORIDE: 104 mmol/L (ref 101–111)
CO2: 35 mmol/L — AB (ref 22–32)
Calcium: 8 mg/dL — ABNORMAL LOW (ref 8.9–10.3)
Creatinine, Ser: 0.68 mg/dL (ref 0.44–1.00)
GFR calc non Af Amer: >60 mL/min (ref >60)
Glucose, Bld: 163 mg/dL — ABNORMAL HIGH (ref 65–99)
POTASSIUM: 2.9 mmol/L — AB (ref 3.5–5.1)
SODIUM: 150 mmol/L — AB (ref 135–145)

## 2015-02-07 LAB — CBC
HEMATOCRIT: 32.5 % — AB (ref 36.0–46.0)
HEMOGLOBIN: 10.4 g/dL — AB (ref 12.0–15.0)
MCH: 28.3 pg (ref 26.0–34.0)
MCHC: 32 g/dL (ref 30.0–36.0)
MCV: 88.6 fL (ref 78.0–100.0)
Platelets: 627 10*3/uL — ABNORMAL HIGH (ref 150–400)
RBC: 3.67 MIL/uL — AB (ref 3.87–5.11)
RDW: 15.2 % (ref 11.5–15.5)
WBC: 17.6 10*3/uL — ABNORMAL HIGH (ref 4.0–10.5)

## 2015-02-07 LAB — GLUCOSE, CAPILLARY
GLUCOSE-CAPILLARY: 139 mg/dL — AB (ref 65–99)
GLUCOSE-CAPILLARY: 139 mg/dL — AB (ref 65–99)
GLUCOSE-CAPILLARY: 259 mg/dL — AB (ref 65–99)
GLUCOSE-CAPILLARY: 227 mg/dL — AB (ref 65–99)
Glucose-Capillary: 162 mg/dL — ABNORMAL HIGH (ref 65–99)
Glucose-Capillary: 193 mg/dL — ABNORMAL HIGH (ref 65–99)
Glucose-Capillary: 259 mg/dL — ABNORMAL HIGH (ref 65–99)
Glucose-Capillary: 261 mg/dL — ABNORMAL HIGH (ref 65–99)

## 2015-02-07 LAB — PHOSPHORUS: PHOSPHORUS: 3.3 mg/dL (ref 2.5–4.6)

## 2015-02-07 LAB — C DIFFICILE QUICK SCREEN W PCR REFLEX
C DIFFICILE (CDIFF) INTERP: NEGATIVE
C DIFFICLE (CDIFF) ANTIGEN: NEGATIVE
C Diff toxin: NEGATIVE

## 2015-02-07 LAB — MAGNESIUM: Magnesium: 1.4 mg/dL — ABNORMAL LOW (ref 1.7–2.4)

## 2015-02-07 MED ORDER — OXYCODONE-ACETAMINOPHEN 5-325 MG PO TABS: 1.0000 | ORAL_TABLET | ORAL | Status: DC | PRN

## 2015-02-07 MED ORDER — POTASSIUM CHLORIDE CRYS ER 20 MEQ PO TBCR
40.0000 meq | EXTENDED_RELEASE_TABLET | Freq: Once | ORAL | Status: AC
  Administered 2015-02-07: 40 meq via ORAL
  Filled 2015-02-07: qty 2

## 2015-02-07 MED ORDER — BOOST / RESOURCE BREEZE PO LIQD
1.0000 | Freq: Three times a day (TID) | ORAL | Status: DC
  Administered 2015-02-07 – 2015-02-08 (×6): 1 via ORAL

## 2015-02-07 MED ORDER — MAGNESIUM SULFATE 2 GM/50ML IV SOLN
2.0000 g | Freq: Once | INTRAVENOUS | Status: AC
  Administered 2015-02-07: 2 g via INTRAVENOUS
  Filled 2015-02-07: qty 50

## 2015-02-07 MED ORDER — ALTEPLASE 100 MG IV SOLR
2.0000 mg | Freq: Once | INTRAVENOUS | Status: AC
  Administered 2015-02-07: 2 mg
  Filled 2015-02-07: qty 2

## 2015-02-07 MED ORDER — SACCHAROMYCES BOULARDII 250 MG PO CAPS
250.0000 mg | ORAL_CAPSULE | Freq: Two times a day (BID) | ORAL | Status: DC
  Administered 2015-02-07 – 2015-02-16 (×19): 250 mg via ORAL
  Filled 2015-02-07 (×19): qty 1

## 2015-02-07 MED ORDER — FAMOTIDINE 20 MG PO TABS
20.0000 mg | ORAL_TABLET | Freq: Two times a day (BID) | ORAL | Status: DC
  Administered 2015-02-07 – 2015-02-11 (×10): 20 mg via ORAL
  Filled 2015-02-07 (×10): qty 1

## 2015-02-07 MED ORDER — POTASSIUM CHLORIDE 10 MEQ/50ML IV SOLN
10.0000 meq | INTRAVENOUS | Status: AC
  Administered 2015-02-07 (×4): 10 meq via INTRAVENOUS
  Filled 2015-02-07 (×4): qty 50

## 2015-02-07 NOTE — Progress Notes (Signed)
[place] Progress Note Patient Name: Christyana Corwin DOB: [birth year] MRN: [id number]   Date of Service  02/07/2015  HPI/Events of Note    eICU Interventions  Hypokalemia, repleted      Intervention Category Intermediate Interventions: Electrolyte abnormality - evaluation and management  Simonne Maffucci 02/07/2015, 5:38 AM

## 2015-02-07 NOTE — Progress Notes (Signed)
Generally weak.Oxygen dependent this afternoon; RR 28-32. Required NRB mask. Congested cough. HR stable, NSR, continues on Aminodarone infusion.Foley remains due to aggressive diuresis with Lasix.

## 2015-02-07 NOTE — Progress Notes (Signed)
7 Days Post-Op  Subjective: NG out and taking clears, tolerating well, + BM.  She still looks dyspneic with just talking and eating.   Complaining of some GERD.  Sites look fine.  Objective: Vital signs in last 24 hours: Temp:  [98 F (36.7 C)-98.4 F (36.9 C)] 98 F (36.7 C) (10/26 0800) Pulse Rate:  [75-107] 75 (10/26 0800) Resp:  [21-39] 35 (10/26 0800) BP: (105-154)/(32-88) 152/61 mmHg (10/26 0800) SpO2:  [90 %-98 %] 92 % (10/26 0800) Weight:  [78 kg (171 lb 15.3 oz)] 78 kg (171 lb 15.3 oz) (10/26 0452) Last BM Date: 02/06/15 2liter IV 75/NG 2 stools Afebrile, RR still up, Sats 90's on nasal cannula 5-6 l/min NA still up   K+ 2.9, mag 1.4 WBC improving Intake/Output from previous day: 10/25 0701 - 10/26 0700 In: 2038.4 [I.V.:1288.4; IV Piggyback:750] Out: 2840 [Urine:2750; Emesis/NG output:75; Drains:15] Intake/Output this shift: Total I/O In: 106.7 [I.V.:56.7; IV Piggyback:50] Out: -   General appearance: alert, cooperative, no distress and appears tired and just slow to mentate. Resp: clear to auscultation bilaterally and she is dyspenic just talking to me. Cardio: sinus tachycardia GI: soft sore, sites all look fine, nothing in drain currently Extremities: no significant edema  Lab Results:   Recent Labs  02/06/15 0500 02/07/15 0442  WBC 23.3* 17.6*  HGB 10.4* 10.4*  HCT 32.2* 32.5*  PLT 567* 627*    BMET  Recent Labs  02/06/15 0500 02/07/15 0442  NA 150* 150*  K 3.1* 2.9*  CL 104 104  CO2 33* 35*  GLUCOSE 148* 163*  BUN 9 7  CREATININE 0.68 0.68  CALCIUM 7.8* 8.0*   PT/INR No results for input(s): LABPROT, INR in the last 72 hours.   Recent Labs Lab 01/31/15 1136 02/01/15 0355  AST 28 29  ALT 18 12*  ALKPHOS 81 49  BILITOT 1.0 0.5  PROT 7.5 5.3*  ALBUMIN 2.8* 2.1*     Lipase     Component Value Date/Time   LIPASE 11* 01/31/2015 1136     Studies/Results: Ct Abdomen Pelvis W Contrast  02/05/2015  CLINICAL DATA:   Leukocytosis, post appendectomy and lysis of adhesions on 01/31/2015, underlying COPD, diabetes mellitus, smoker EXAM: CT ABDOMEN AND PELVIS WITH CONTRAST TECHNIQUE: Multidetector CT imaging of the abdomen and pelvis was performed using the standard protocol following bolus administration of intravenous contrast. Sagittal and coronal MPR images reconstructed from axial data set. CONTRAST:  142mL OMNIPAQUE IOHEXOL 300 MG/ML SOLN IV. Oral contrast was not administered. COMPARISON:  01/31/2015 FINDINGS: Bibasilar atelectasis, difficult to exclude mild consolidation in lower lobes. Small pericardial effusion. Liver, spleen, pancreas, pancreas, and adrenal glands normal appearance. Dependent gallstone in gallbladder. Nasogastric tube in stomach. Surgical drain in pelvis and RIGHT lower quadrant. Few small bowel loops superior mildly distended without definite evidence of obstruction. Mild bowel wall thickening of small bowel loops in pelvis with scattered ascites and areas of enhancement question peritonitis Focal cul-de-sac fluid collection measures up to 8.8 x 1.5 x 3.5 cm in size question abscess. Numerous additional small interloop collections of fluid are identified which do not demonstrate the same degree of margination or enhancement Air and Foley catheter within urinary bladder. Unremarkable uterus and adnexa. Colon decompressed. No mass or adenopathy. Scattered atherosclerotic calcifications without aneurysm. No free air or hernia. Bones unremarkable. IMPRESSION: Post appendectomy with scattered inflammatory changes in the pelvis, which include bowel wall thickening and enhancement of multiple small bowel loops as well has a loculated fluid collection with  enhancing margins in the pelvis/cul de sac 8.8 x 1.5 x 3.5 cm in size question peritonitis with pelvic abscess. Multiple additional small interloop collections of fluid are seen along bowel loops without the similar degree of enhancement or demarcation.  Cholelithiasis. Electronically Signed   By: Lavonia Dana M.D.   On: 02/05/2015 15:59   Dg Chest Port 1 View  02/06/2015  CLINICAL DATA:  Respiratory failure EXAM: PORTABLE CHEST 1 VIEW COMPARISON:  February 05, 2015 and February 01, 2015; chest CT Aug 24, 2014 FINDINGS: Central catheter tip is in the superior vena cava. Nasogastric tube tip extends into the stomach. No pneumothorax. There is airspace consolidation in the left base with small left effusion, stable. There is a minimal right effusion. The interstitium is diffusely prominent, likely with a mild degree of interstitial edema. There is no new parenchymal lung opacity. Heart is upper normal in size with pulmonary vascularity within normal limits. Atherosclerotic changes noted in aorta. No adenopathy is appreciable. IMPRESSION: Tube positions as described without pneumothorax. Persistent left lower lobe consolidation. Small pleural effusions bilaterally with mild interstitial edema. Suspect a degree of underlying congestive heart failure. The lungs and cardiac silhouette appears stable compared to 1 day prior. Electronically Signed   By: Lowella Grip III M.D.   On: 02/06/2015 07:20    Medications: . antiseptic oral rinse  7 mL Mouth Rinse QID  . chlorhexidine      . chlorhexidine gluconate  15 mL Mouth Rinse BID  . clonazepam  1 mg Oral BID  . famotidine (PEPCID) IV  20 mg Intravenous Q12H  . furosemide  40 mg Intravenous Q12H  . heparin subcutaneous  5,000 Units Subcutaneous 3 times per day  . imipenem-cilastatin  500 mg Intravenous 4 times per day  . insulin aspart  0-9 Units Subcutaneous 6 times per day  . ipratropium-albuterol  3 mL Nebulization TID  . potassium chloride  10 mEq Intravenous Q1 Hr x 4  . sodium chloride  10-40 mL Intracatheter Q12H    Assessment/Plan Perforated appendicitis with extensive adhesions due to prior intraabdominal surgery. S/p Lysis of adhesions for approximately 40 minutes with laparoscopic  appendectomy and evacuation of pelvic abscesses, 01/31/15, Dr. Alphonsa Overall POD 6 Sepsis with hypotension, tachycardia, decreased urine output, Tachypnea with some decreased O2 saturation  Pericardial effusion Post op VDRF/volume overload Extubated 02/04/15. Post op Atrial fibrillation COPD/ongoing tobacco use/mediastinal adenopathy/Chronic DOE Chronic leukocytosis  AODM with hypoglycemia Bipolar/depression Hyperlipidemia Hypokalemia Antibiotics: Day 8 Imipenem-cilastatin She has had 4 days of fluconazole. DVT: Heparin/SCD    Plan:  Add pepcid for her GERD, continue clears today and if she does well go up to full tomorrow, mobilize more if she can tolerate it.  She has PT working with her already. CCM is replacing electrolytes    LOS: 7 days    Caydn Justen 02/07/2015

## 2015-02-07 NOTE — Progress Notes (Signed)
Nutrition Follow-up  DOCUMENTATION CODES:   Not applicable  INTERVENTION:  - Will order Boost Breeze po TID, each supplement provides 250 kcal and 9 grams of protein - Diet advancement as medically feasible - RD will continue to monitor for needs  NUTRITION DIAGNOSIS:   Inadequate protein intake related to inability to eat as evidenced by NPO status. -resolving with CLD order  GOAL:   Patient will meet greater than or equal to 90% of their needs -unmet  MONITOR:   Diet advancement, Supplement acceptance, Weight trends, Labs, Skin, I & O's  ASSESSMENT:   69 y/o F, current smoker, with a PMH of COPD, DM, HLD, who [facility] with nausea/vomiting, abdominal pain, chills and productive cough. Found to have a ruptured appendix with developing pelvic abscess. PCCM consulted for admission.   10/26  Pt extubated 10/23 @ ~1217 and diet advanced to CLD 10/25 @ 1358. Pt reports having a small amount of liquids yesterday and states that this AM she had cranberry juice and Jello; she tried the chicken broth but did not like it. She denies abdominal pain or nausea with intakes. She states she has been feeling hungry yesterday and today.   Noted weight down 15 lbs since 10/19 which is likely at least partly fluid related; Lasix order in place. Nutrition needs re-estimated s/p extubation.  Not able to meet needs with current diet order; will order Boost Breeze to supplement CLD. Medications reviewed. Labs reviewed; CBGs: 139-182 mg/dL, Na: 150 mmol/L, K: 2.9 mmol/L, Ca: 8 mg/dL, Mg: 1.4 mg/dL.    10/20 - Patient is currently intubated on ventilator support - MV: 11.8 L/min; Propofol: 10.2 ml/hr (269 kcal) - Pt with mitten restraints in place and awake at time of visit with NGT in place.  - No family or visitors present to provide PTA information.  - Per chart review, pt has gained 9 lbs in the past 5 months.  - No muscle or fat wasting present. Mild edema  throughout.  Diet Order:  Diet clear liquid Room service appropriate?: Yes; Fluid consistency:: Thin  Skin:  Wound (see comment) (abdominal incision (01/31/15))  Last BM:  10/25  Height:   Ht Readings from Last 1 Encounters:  01/31/15 5\' 10"  (1.778 m)    Weight:   Wt Readings from Last 1 Encounters:  02/07/15 171 lb 15.3 oz (78 kg)    Ideal Body Weight:  68.18 kg (kg)  BMI:  Body mass index is 24.67 kg/(m^2).  Estimated Nutritional Needs:   Kcal:  1550-1750  Protein:  60-70 grams  Fluid:  1.8 L/day  EDUCATION NEEDS:   No education needs identified at this time     Jarome Matin, RD, LDN Inpatient Clinical Dietitian Pager # [phone] After hours/weekend pager # [phone]

## 2015-02-07 NOTE — Progress Notes (Signed)
Transfer primary SVC to [place] as of 10/27 0700.  Will update Dr. Coralyn Pear on patients clinical status.  PCCM will sign off.     Noe Gens, NP-C [facility] Pgr: [phone] or if no answer [phone] 02/07/2015, 10:54 AM

## 2015-02-07 NOTE — Progress Notes (Signed)
PULMONARY / CRITICAL CARE MEDICINE   Name: Mercedes Thornton MRN: [id number] DOB: April 16, 1945    ADMISSION DATE:  01/31/2015 CONSULTATION DATE:  01/31/15  REFERRING MD :  Dr. Tyrone Nine / EDP   CHIEF COMPLAINT:  Ruptured Appendix   INITIAL PRESENTATION: 69 y/o F, current smoker, with a PMH of COPD, DM, HLD, who presented to [facility] on 10/19 with nausea/vomiting, abdominal pain, chills and productive cough.  Found to have a ruptured appendix with developing pelvic abscess. PCCM consulted for admission.    STUDIES:  10/19 CT ABD / Pelvis >> findings most compatible with ruptured appendicitis with developing pelvic abscess, moderate small bowel dilation concerning for ileus rather than obstruction, cholelithiasis, unchanged pericardial effusion, distal esophageal thickening. 10/24 CT Abd/Pelvis >> Loculated fluids in pelvis ? Abscess. Inflammatory changes in pelvis ? Peritonitis.   SIGNIFICANT EVENTS: 10/19  Admit with ruptured appendix> taken to OR, abscess drained, appendectomy (ruptured, appendolith noted), lysis of adhesions, closed 10/20 failed SBT this morning, Afib with RVR, hypoxemia, placed on cardizem drip. 1024. Extrubated. Afib >> started on amiodarone drip.    SUBJECTIVE: Was briefly on a NRB last night. Now back on nasal cannula. No complaints. Feels well.  VITAL SIGNS: Temp:  [98 F (36.7 C)-98.4 F (36.9 C)] 98 F (36.7 C) (10/26 0800) Pulse Rate:  [75-107] 75 (10/26 0800) Resp:  [21-39] 35 (10/26 0800) BP: (105-154)/(32-88) 152/61 mmHg (10/26 0800) SpO2:  [90 %-98 %] 92 % (10/26 0800) Weight:  [171 lb 15.3 oz (78 kg)] 171 lb 15.3 oz (78 kg) (10/26 0452)   HEMODYNAMICS:     VENTILATOR SETTINGS:     INTAKE / OUTPUT:  Intake/Output Summary (Last 24 hours) at 02/07/15 0858 Last data filed at 02/07/15 0800  Gross per 24 hour  Intake 2028.4 ml  Output   2840 ml  Net -811.6 ml    PHYSICAL EXAMINATION: General:  Awake, oriented. HENT: Moist mucus membranes.  PULM:  Clear anteriorly. No wheeze or crackles. CV: RRR, No MRG GI: + BS, JP drain in place  LABS:  CBC  Recent Labs Lab 02/05/15 1054 02/06/15 0500 02/07/15 0442  WBC 28.9* 23.3* 17.6*  HGB 8.9* 10.4* 10.4*  HCT 27.0* 32.2* 32.5*  PLT 547* 567* 627*   Coag's No results for input(s): APTT, INR in the last 168 hours.   BMET  Recent Labs Lab 02/05/15 1615 02/06/15 0500 02/07/15 0442  NA 146* 150* 150*  K 2.7* 3.1* 2.9*  CL 100* 104 104  CO2 31 33* 35*  BUN 11 9 7   CREATININE 0.77 0.68 0.68  GLUCOSE 198* 148* 163*   Electrolytes  Recent Labs Lab 02/05/15 1054  02/05/15 1615 02/06/15 0500 02/07/15 0442  CALCIUM 6.6*  < > 7.6* 7.8* 8.0*  MG 1.2*  --  1.3* 2.1 1.4*  PHOS 2.3*  --   --  2.6 3.3  < > = values in this interval not displayed. Sepsis Markers  Recent Labs Lab 01/31/15 1136  02/01/15 0355 02/01/15 1530 02/01/15 1800 02/02/15 0610 02/02/15 1318  LATICACIDVEN  --   < >  --  1.6 1.4  --  1.22  PROCALCITON 29.70  --  12.21  --   --  5.23  --   < > = values in this interval not displayed. ABG  Recent Labs Lab 01/31/15 2137 02/01/15 1525  PHART 7.334* 7.442  PCO2ART 47.9* 33.9*  PO2ART 74.1* 89.2     Liver Enzymes  Recent Labs Lab 01/31/15 1136 02/01/15 0355  AST  28 29  ALT 18 12*  ALKPHOS 81 49  BILITOT 1.0 0.5  ALBUMIN 2.8* 2.1*   Cardiac Enzymes  Recent Labs Lab 02/01/15 1530 02/01/15 2110 02/02/15 0311  TROPONINI 0.13* 0.10* 0.08*     Glucose  Recent Labs Lab 02/06/15 1201 02/06/15 1627 02/06/15 1956 02/07/15 0012 02/07/15 0342 02/07/15 0736  GLUCAP 191* 176* 139* 193* 162* 139*    Imaging  CXR is pending.   ASSESSMENT / PLAN:  PULMONARY OETT 10/19 >  A: Tobacco Abuse  Acute Respiratory failure due to volume overload (acute pulmonary edema) in setting of COPD; doubt acute lung injury/ARDS given volume resuscitation COPD, does not appear to be in exacerbation this morning, unclear baseline severity  P:    Wean dow O2 as tolerated. Diurese. Lasix 40 mg IV bid Follow CXR today. PRN albuterol + scheduled duoneb Smoking cessation counseling after extubation   CARDIOVASCULAR PICC ordered 10/20 A:  Shock> volume resuscitated, off pressors. Afib > now rate controlled Pericardial Effusion - hx of, noted on CT Chest 08/2014, appears enlarged on admit CXR but CT comparison unchanged Acute pulmonary edema HLD  P:  Diurese Hold home lisinopril, lovastatin Amio for afib  RENAL A:   Hypokalemia. Hypernatremia P:   Monitor BMET and UOP Replace electrolytes as needed. Encourage PO intake of water. Continue to monitor hypernatremia.  GASTROINTESTINAL A:   Ruptured Appendix with abscess > s/p appendectomy, lysis of adhesions, washout, and drain placement ileus Cholelithiasis  P:   Post op care per surgery Nutrition per surgery  H2 blocker for stress ulcer prophylaxis See ID  On clears today.  HEMATOLOGIC A:   Leukocytosis - in setting of intra-abdominal process  P:  See ID  Monitor CBC  Heparin sub cutaneously for DVT prophylaxis   INFECTIOUS A:   Sepsis due to ruptured Appendix with pelvic abscess P:   BCx2 10/19 >>  UC 10/19 >>   Cipro, start date 10/19, 1 dose Flagyl, start date 10/19, 1 dose  Imipenem 10/19 >  Vanc 10/19 > 10/21  Follow cultures PCT > 29 > 12 > 5  CT scan of abd shows a fluid collection concerning for abscess. No plan for drainage by surgery. WBC count continues to improve.  ENDOCRINE A:   Hypoglycemia resolved DM II   P:   CBG Q4 while NPO  Hold home metformin, glimepiride.  NEUROLOGIC A:   Pain post op Bipolar Disorder  Anxiety > severe Sedation needs for vent synchrony P:   Clonazepam, low dose for severe anxiety (home med) PRN ativan for anxiety.   FAMILY  - Updates: Patient and daughter updated 10/26. - Inter-disciplinary family meet or Palliative Care meeting due by:  10/26  CC time 40 minutes today  Marshell Garfinkel  MD [facility] Pager [phone] If no answer or after 3pm call: [phone] 02/07/2015, 8:58 AM

## 2015-02-07 NOTE — Progress Notes (Signed)
Date: February 07, 2015 Chart reviewed for concurrent status and case management needs. Will continue to follow patient for changes and needs: Rhonda Davis, RN, BSN, CCM   [phone] 

## 2015-02-08 ENCOUNTER — Inpatient Hospital Stay (HOSPITAL_COMMUNITY): Payer: Medicare Other

## 2015-02-08 DIAGNOSIS — I4891 Unspecified atrial fibrillation: Secondary | ICD-10-CM

## 2015-02-08 LAB — BASIC METABOLIC PANEL
Anion gap: 10 (ref 5–15)
BUN: 6 mg/dL (ref 6–20)
CALCIUM: 8 mg/dL — AB (ref 8.9–10.3)
CHLORIDE: 100 mmol/L — AB (ref 101–111)
CO2: 35 mmol/L — ABNORMAL HIGH (ref 22–32)
CREATININE: 0.68 mg/dL (ref 0.44–1.00)
GFR calc Af Amer: >60 mL/min (ref >60)
Glucose, Bld: 153 mg/dL — ABNORMAL HIGH (ref 65–99)
Potassium: 2.9 mmol/L — ABNORMAL LOW (ref 3.5–5.1)
SODIUM: 145 mmol/L (ref 135–145)

## 2015-02-08 LAB — CULTURE, BLOOD (ROUTINE X 2): CULTURE: NO GROWTH

## 2015-02-08 LAB — GLUCOSE, CAPILLARY
GLUCOSE-CAPILLARY: 213 mg/dL — AB (ref 65–99)
GLUCOSE-CAPILLARY: 127 mg/dL — AB (ref 65–99)
Glucose-Capillary: 157 mg/dL — ABNORMAL HIGH (ref 65–99)
Glucose-Capillary: 124 mg/dL — ABNORMAL HIGH (ref 65–99)
Glucose-Capillary: 174 mg/dL — ABNORMAL HIGH (ref 65–99)
Glucose-Capillary: 156 mg/dL — ABNORMAL HIGH (ref 65–99)

## 2015-02-08 LAB — PHOSPHORUS: Phosphorus: 3.6 mg/dL (ref 2.5–4.6)

## 2015-02-08 LAB — CBC
HCT: 32.5 % — ABNORMAL LOW (ref 36.0–46.0)
Hemoglobin: 10.5 g/dL — ABNORMAL LOW (ref 12.0–15.0)
MCH: 28.5 pg (ref 26.0–34.0)
MCHC: 32.3 g/dL (ref 30.0–36.0)
MCV: 88.3 fL (ref 78.0–100.0)
PLATELETS: 651 10*3/uL — AB (ref 150–400)
RBC: 3.68 MIL/uL — ABNORMAL LOW (ref 3.87–5.11)
RDW: 15.3 % (ref 11.5–15.5)
WBC: 18 10*3/uL — ABNORMAL HIGH (ref 4.0–10.5)

## 2015-02-08 LAB — MAGNESIUM: MAGNESIUM: 1.5 mg/dL — AB (ref 1.7–2.4)

## 2015-02-08 MED ORDER — DILTIAZEM HCL ER COATED BEADS 120 MG PO CP24
120.0000 mg | ORAL_CAPSULE | Freq: Every day | ORAL | Status: DC
  Administered 2015-02-08 – 2015-02-14 (×7): 120 mg via ORAL
  Filled 2015-02-08 (×7): qty 1

## 2015-02-08 MED ORDER — POTASSIUM CHLORIDE 10 MEQ/50ML IV SOLN
10.0000 meq | INTRAVENOUS | Status: AC
  Administered 2015-02-08 (×6): 10 meq via INTRAVENOUS
  Filled 2015-02-08 (×6): qty 50

## 2015-02-08 MED ORDER — MAGNESIUM SULFATE 2 GM/50ML IV SOLN
2.0000 g | Freq: Once | INTRAVENOUS | Status: AC
  Administered 2015-02-08: 2 g via INTRAVENOUS
  Filled 2015-02-08: qty 50

## 2015-02-08 NOTE — Progress Notes (Signed)
   02/08/15 1400  Clinical Encounter Type  Visited With Patient and family together  Visit Type Follow-up;Psychological support;Spiritual support;Critical Care  Spiritual Encounters  Spiritual Needs Prayer;Emotional;Other (Comment) (Pastoral Conversation)  Stress Factors  Patient Stress Factors Health changes;Other (Comment) (Inability to eat)  Family Stress Factors Health changes (Patient Smoking Habit)   The Chaplain followed up with the patient from a previous visit. The patient was awake and receptive to the Chaplain's visit. The patient's two daughters were at the bedside.  The patient stated that she was feeling better than she was during the previous visit. The patient has been able to be placed on a liquid diet, but wants to get back to eating solid foods. The patient's daughters named their concern about the patient's smoking habit, and stated that they wanted the Chaplain to continue praying for strength that the patient can keep to her plan to stop smoking. The Chaplain prayed with the family. No emotional concerns were stated during this visit.  The Chaplain gave the patient and prayer shawl and bone pillow. The Chaplain will follow up at a later time.   Chaplain [place].

## 2015-02-08 NOTE — Progress Notes (Signed)
[facility] PROGRESS NOTE  Mercedes Thornton OBS:[id number] DOB: October 23, 1945 DOA: 01/31/2015 PCP: Antonietta Jewel, MD  Assessment/Plan: 1. Acute hypoxemic respiratory failure. -Postoperatively patient requiring ventilator support -Could be secondary to volume overload with imaging studies revealing acute pulmonary edema. Patient also having a history of structural lung disease in setting of chronic obstructive lung disease. -She continues to require supplemental oxygen -Repeat chest x-ray performed on 02/08/2015 showing diffuse interstitial prominence which could reflect combination of COPD and edema. Atelectasis. -Encourage incentive spirometry  2.  Ruptured appendicitis -Patient undergoing laparoscopic appendectomy with evacuation of pelvic abscess on 01/31/2015, procedure performed by Dr. Lucia Gaskins of general surgery. -Postoperative course, complicated by sepsis, she remains on antibiotic therapy with Primaxin 500 mg IV every 6 hours.  3.  Sepsis -Present on admission, source of infection likely ruptured appendix. Sepsis evidenced by the presence of shock, requiring IV pressor support, acute respiratory failure requiring ventilator support. -Showing clinical improvement. -Blood cultures obtained on 01/31/2015 at 02/03/2015 showing no growth -Remains on Primaxin  4.  Pulmonary edema -Likely contributing to rotatory failure -Transthoracic echocardiogram performed on 02/02/2015 showing preserved systolic function  -Continue IV Lasix for now  5.  Pericardial effusion. -Patient having a moderate pericardial effusion seen on transthoracic echocardiogram from 02/02/2015. Echo did not reveal evidence of hemodynamic compromise -Will repeat limited echo to assess pericardial effusion  6.  A. Fib -Patient's hospital course, complicated by A. fib RVR requiring amiodarone drip -She converted to sinus rhythm, will discontinue amiodarone drip, started Cardizem 120 mg by mouth daily as blood pressures  have stabilized.  7.  Hypokalemia -A.m. labs showing potassium of 2.9 for which she was administered IV potassium and placement  8. Hypomagnesemia -A.m. labs showing magnesium 1.5, will replace with 2 g of IV magnesium today  Code Status: Full code Family Communication:  Disposition Plan: Continue close monitoring in the intensive care unit.   Consultants:  Pulmonary critical care medicine  General surgery  Procedures:  Laparoscopic appendectomy procedure performed on 01/31/2015  Antibiotics:  Permaxin  HPI/Subjective: Mercedes Thornton is a 69 year old female with a past medical history of chronic objective pulmonary disease, diabetes mellitus, dyslipidemia, admitted on 01/31/2015 presenting with nausea/vomiting/abdominal pain with CT scan of abdomen and pelvis revealing a ruptured appendicitis with developing pelvic abscess. She was taken to the operating room on 01/31/2015 where she underwent laparoscopic appendectomy and evacuation of pelvic abscesses. Procedure was performed by Dr.Newman of general surgery. Postoperatively she went into acute hypoxemic respiratory failure and required ventilatory support. She had sepsis with hypotension requiring IV pressor support. She was treated with broad-spectrum IV and microbial therapy with Primaxin, ciprofloxacin and IV vancomycin. She showed gradual clinical improvement and was extubated on 02/04/2015.  Objective: Filed Vitals:   02/08/15 1600  BP:   Pulse:   Temp: 98.1 F (36.7 C)  Resp:     Intake/Output Summary (Last 24 hours) at 02/08/15 1639 Last data filed at 02/08/15 1500  Gross per 24 hour  Intake 1827.1 ml  Output   3150 ml  Net -1322.9 ml   Filed Weights   02/06/15 0500 02/07/15 0452 02/08/15 1100  Weight: 79.5 kg (175 lb 4.3 oz) 78 kg (171 lb 15.3 oz) 76.4 kg (168 lb 6.9 oz)    Exam:   General:  Patient is awake and alert oriented, states feeling better  Cardiovascular: Regular rate rhythm normal  S1-S2  Respiratory: Diminished breath sounds to bases bilaterally, few tori wheezes, positive crackles  Abdomen: Surgical incision sites appears clean  Musculoskeletal: trace edema to lower extremities.   Data Reviewed: Basic Metabolic Panel:  Recent Labs Lab 02/05/15 0431 02/05/15 1054 02/05/15 1420 02/05/15 1615 02/06/15 0500 02/07/15 0442 02/08/15 0430  NA 147* 147* 146* 146* 150* 150* 145  K 2.4* 4.7 6.5* 2.7* 3.1* 2.9* 2.9*  CL 103 108 106 100* 104 104 100*  CO2 31 29 30 31  33* 35* 35*  GLUCOSE 149* 180* 197* 198* 148* 163* 153*  BUN 12 10 10 11 9 7 6   CREATININE 0.72 0.68 0.75 0.77 0.68 0.68 0.68  CALCIUM 7.6* 6.6* 6.8* 7.6* 7.8* 8.0* 8.0*  MG 1.8 1.2*  --  1.3* 2.1 1.4* 1.5*  PHOS 3.1 2.3*  --   --  2.6 3.3 3.6   Liver Function Tests: No results for input(s): AST, ALT, ALKPHOS, BILITOT, PROT, ALBUMIN in the last 168 hours. No results for input(s): LIPASE, AMYLASE in the last 168 hours. No results for input(s): AMMONIA in the last 168 hours. CBC:  Recent Labs Lab 02/02/15 0610 02/03/15 0500 02/05/15 0431 02/05/15 1054 02/06/15 0500 02/07/15 0442 02/08/15 0430  WBC 26.4* 31.5* 33.1* 28.9* 23.3* 17.6* 18.0*  NEUTROABS 21.3* 26.5*  --  25.2*  --   --   --   HGB 12.0 11.2* 10.4* 8.9* 10.4* 10.4* 10.5*  HCT 38.1 35.1* 31.3* 27.0* 32.2* 32.5* 32.5*  MCV 89.6 89.5 86.7 87.1 87.5 88.6 88.3  PLT 392 447* 523* 547* 567* 627* 651*   Cardiac Enzymes:  Recent Labs Lab 02/01/15 2110 02/02/15 0311  TROPONINI 0.10* 0.08*   BNP (last 3 results)  Recent Labs  02/01/15 1530  BNP 487.5*    ProBNP (last 3 results) No results for input(s): PROBNP in the last 8760 hours.  CBG:  Recent Labs Lab 02/07/15 2317 02/08/15 0418 02/08/15 0734 02/08/15 1216 02/08/15 1540  GLUCAP 227* 157* 124* 213* 174*    Recent Results (from the past 240 hour(s))  Blood Culture (routine x 2)     Status: None   Collection Time: 01/31/15 11:50 AM  Result Value Ref Range  Status   Specimen Description BLOOD LEFT ANTECUBITAL  Final   Special Requests BOTTLES DRAWN AEROBIC AND ANAEROBIC 5CC  Final   Culture   Final    NO GROWTH 5 DAYS Performed at [facility]    Report Status 02/05/2015 FINAL  Final  Blood Culture (routine x 2)     Status: None   Collection Time: 01/31/15 12:02 PM  Result Value Ref Range Status   Specimen Description BLOOD LEFT HAND  Final   Special Requests IN PEDIATRIC BOTTLE [facility]  Final   Culture   Final    NO GROWTH 5 DAYS Performed at [facility]    Report Status 02/05/2015 FINAL  Final  Urine culture     Status: None   Collection Time: 01/31/15  2:00 PM  Result Value Ref Range Status   Specimen Description URINE, CATHETERIZED  Final   Special Requests NONE  Final   Culture   Final    NO GROWTH 1 DAY Performed at [facility]    Report Status 02/01/2015 FINAL  Final  MRSA PCR Screening     Status: None   Collection Time: 02/01/15 12:32 AM  Result Value Ref Range Status   MRSA by PCR NEGATIVE NEGATIVE Final    Comment:        The GeneXpert MRSA Assay (FDA approved for NASAL specimens only), is one component of a comprehensive MRSA colonization  surveillance program. It is not intended to diagnose MRSA infection nor to guide or monitor treatment for MRSA infections.   Culture, blood (routine x 2)     Status: None   Collection Time: 02/03/15  1:00 PM  Result Value Ref Range Status   Specimen Description BLOOD LEFT ANTECUBITAL  Final   Special Requests IN PEDIATRIC BOTTLE [place]  Final   Culture   Final    NO GROWTH 5 DAYS Performed at [facility]    Report Status 02/08/2015 FINAL  Final  Culture, Urine     Status: None   Collection Time: 02/03/15  1:00 PM  Result Value Ref Range Status   Specimen Description URINE, CATHETERIZED  Final   Special Requests NONE  Final   Culture   Final    NO GROWTH 2 DAYS Performed at [facility]    Report Status 02/05/2015 FINAL  Final  C  difficile quick scan w PCR reflex     Status: None   Collection Time: 02/06/15 10:41 PM  Result Value Ref Range Status   C Diff antigen NEGATIVE NEGATIVE Final   C Diff toxin NEGATIVE NEGATIVE Final   C Diff interpretation Negative for toxigenic C. difficile  Final     Studies: Dg Chest Port 1 View  02/08/2015  CLINICAL DATA:  Acute respiratory failure. EXAM: PORTABLE CHEST 1 VIEW COMPARISON:  02/07/2015 FINDINGS: A right PICC remains in place with tip projecting over the lower SVC. Cardiomediastinal silhouette is unchanged. Diffusely increased interstitial markings are stable to slightly increased compared to the prior study with more patchy opacities remaining in both lung bases. Small bilateral pleural effusions are not excluded. No pneumothorax is seen. IMPRESSION: 1. Diffuse interstitial prominence which may reflect a combination of underlying COPD and superimposed edema. 2. Patchy bibasilar opacities suggestive of atelectasis with small pleural effusions not excluded. Electronically Signed   By: Logan Bores M.D.   On: 02/08/2015 07:45   Dg Chest Port 1 View  02/07/2015  CLINICAL DATA:  Productive cough. EXAM: PORTABLE CHEST 1 VIEW COMPARISON:  February 06, 2015. FINDINGS: Stable cardiomediastinal silhouette. No pneumothorax is noted. Right-sided PICC line is noted with tip in expected position of the SVC. Mild central pulmonary vascular congestion is noted with probable bilateral pulmonary edema. Stable left basilar opacity is noted consistent with atelectasis and effusion. Bony thorax is unremarkable. Nasogastric tube has been removed. IMPRESSION: Mild central pulmonary vascular congestion is noted with possible bilateral perihilar edema. Stable left basilar opacity is noted concerning for atelectasis and effusion. Electronically Signed   By: Marijo Conception, M.D.   On: 02/07/2015 10:17    Scheduled Meds: . antiseptic oral rinse  7 mL Mouth Rinse QID  . chlorhexidine gluconate  15 mL Mouth  Rinse BID  . clonazepam  1 mg Oral BID  . diltiazem  120 mg Oral Daily  . famotidine  20 mg Oral BID AC  . feeding supplement  1 Container Oral TID BM  . furosemide  40 mg Intravenous Q12H  . heparin subcutaneous  5,000 Units Subcutaneous 3 times per day  . imipenem-cilastatin  500 mg Intravenous 4 times per day  . insulin aspart  0-9 Units Subcutaneous 6 times per day  . ipratropium-albuterol  3 mL Nebulization TID  . saccharomyces boulardii  250 mg Oral BID  . sodium chloride  10-40 mL Intracatheter Q12H   Continuous Infusions: . sodium chloride 10 mL/hr at 02/01/15 0940  . 0.9 % NaCl with  KCl 40 mEq / L 40 mL/hr (02/07/15 2322)  . dextrose Stopped (02/02/15 0830)  . fentaNYL infusion INTRAVENOUS Stopped (02/04/15 1200)  . phenylephrine (NEO-SYNEPHRINE) Adult infusion Stopped (02/05/15 4665)    Principal Problem:   Ruptured appendix Active Problems:   Perforated appendicitis   Hypoglycemia   Acute respiratory failure with hypoxemia   Sepsis ([place])   Acute respiratory failure with hypoxemia ([place])   Atrial fibrillation ([place])    Time spent: 45 min    Kelvin Cellar  [facility] Pager [phone]. If 7PM-7AM, please contact night-coverage at [email or web address], password [facility] 02/08/2015, 4:39 PM  LOS: 8 days

## 2015-02-08 NOTE — Plan of Care (Signed)
Problem: Phase III Progression Outcomes Goal: Hemodynamically stable Outcome: Completed/Met Date Met:  02/08/15 Amiodorone d/c today.Pt on PO cardizem Goal: Activity advanced as tolerated Outcome: Progressing Pt sat up in chair for almost 2 hours today. She refused to walk in the hall because she feels to weak.

## 2015-02-08 NOTE — Progress Notes (Signed)
8 Days Post-Op  Subjective: She is pretty much with it this AM.  Comfortable on her side.  Tolerating clears, although not much recorded.  Objective: Vital signs in last 24 hours: Temp:  [97.5 F (36.4 C)-98.6 F (37 C)] 98.2 F (36.8 C) (10/27 0400) Pulse Rate:  [75-92] 75 (10/26 2015) Resp:  [27-39] 32 (10/27 0600) BP: (113-150)/(41-110) 122/51 mmHg (10/27 0600) SpO2:  [85 %-100 %] 98 % (10/27 0600) FiO2 (%):  [55 %] 55 % (10/26 2015) Last BM Date: 02/06/15 100 PO listed  Diet: clears Urine 3300 Drain 0 BM x 3 listed Afebrile, VSS K+2.9 WBC still up 1800 CT on 02/05/15 showed:  Focal cul-de-sac fluid collection measures up to 8.8 x 1.5 x 3.5 cm in size question abscess.  Not amenable to drainage. Intake/Output from previous day: 10/26 0701 - 10/27 0700 In: 1820 [P.O.:100; I.V.:1220; IV Piggyback:500] Out: 3300 [Urine:3300] Intake/Output this shift:    General appearance: alert, cooperative, no distress and on FM in bed. Resp: mostly clear, she is coughing up some thick stuff while, in room. GI: soft, sore, sites all look good.  +BS, + Bm  Lab Results:   Recent Labs  02/07/15 0442 02/08/15 0430  WBC 17.6* 18.0*  HGB 10.4* 10.5*  HCT 32.5* 32.5*  PLT 627* 651*    BMET  Recent Labs  02/07/15 0442 02/08/15 0430  NA 150* 145  K 2.9* 2.9*  CL 104 100*  CO2 35* 35*  GLUCOSE 163* 153*  BUN 7 6  CREATININE 0.68 0.68  CALCIUM 8.0* 8.0*   PT/INR No results for input(s): LABPROT, INR in the last 72 hours.  No results for input(s): AST, ALT, ALKPHOS, BILITOT, PROT, ALBUMIN in the last 168 hours.   Lipase     Component Value Date/Time   LIPASE 11* 01/31/2015 1136     Studies/Results: Dg Chest Port 1 View  02/08/2015  CLINICAL DATA:  Acute respiratory failure. EXAM: PORTABLE CHEST 1 VIEW COMPARISON:  02/07/2015 FINDINGS: A right PICC remains in place with tip projecting over the lower SVC. Cardiomediastinal silhouette is unchanged. Diffusely increased  interstitial markings are stable to slightly increased compared to the prior study with more patchy opacities remaining in both lung bases. Small bilateral pleural effusions are not excluded. No pneumothorax is seen. IMPRESSION: 1. Diffuse interstitial prominence which may reflect a combination of underlying COPD and superimposed edema. 2. Patchy bibasilar opacities suggestive of atelectasis with small pleural effusions not excluded. Electronically Signed   By: Logan Bores M.D.   On: 02/08/2015 07:45   Dg Chest Port 1 View  02/07/2015  CLINICAL DATA:  Productive cough. EXAM: PORTABLE CHEST 1 VIEW COMPARISON:  February 06, 2015. FINDINGS: Stable cardiomediastinal silhouette. No pneumothorax is noted. Right-sided PICC line is noted with tip in expected position of the SVC. Mild central pulmonary vascular congestion is noted with probable bilateral pulmonary edema. Stable left basilar opacity is noted consistent with atelectasis and effusion. Bony thorax is unremarkable. Nasogastric tube has been removed. IMPRESSION: Mild central pulmonary vascular congestion is noted with possible bilateral perihilar edema. Stable left basilar opacity is noted concerning for atelectasis and effusion. Electronically Signed   By: Marijo Conception, M.D.   On: 02/07/2015 10:17    Medications: . antiseptic oral rinse  7 mL Mouth Rinse QID  . chlorhexidine gluconate  15 mL Mouth Rinse BID  . clonazepam  1 mg Oral BID  . famotidine  20 mg Oral BID AC  . feeding supplement  1 Container Oral TID BM  . furosemide  40 mg Intravenous Q12H  . heparin subcutaneous  5,000 Units Subcutaneous 3 times per day  . imipenem-cilastatin  500 mg Intravenous 4 times per day  . insulin aspart  0-9 Units Subcutaneous 6 times per day  . ipratropium-albuterol  3 mL Nebulization TID  . magnesium sulfate 1 - 4 g bolus IVPB  2 g Intravenous Once  . potassium chloride  10 mEq Intravenous Q1 Hr x 6  . saccharomyces boulardii  250 mg Oral BID  .  sodium chloride  10-40 mL Intracatheter Q12H    Assessment/Plan Perforated appendicitis with extensive adhesions due to prior intraabdominal surgery. S/p Lysis of adhesions for approximately 40 minutes with laparoscopic appendectomy and evacuation of pelvic abscesses, 01/31/15, Dr. Alphonsa Overall POD 6 Sepsis with hypotension, tachycardia, decreased urine output, Tachypnea with some decreased O2 saturation  Pericardial effusion Post op VDRF/volume overload Extubated 02/04/15. Post op Atrial fibrillation COPD/ongoing tobacco use/mediastinal adenopathy/Chronic DOE Chronic leukocytosis  AODM with hypoglycemia Bipolar/depression Hyperlipidemia Hypokalemia Antibiotics: Day 9 Imipenem-cilastatin She has completed  4 days of fluconazole 02/03/15. DVT: Heparin/SCD    Plan:  Full liquids, increase time OOB and ambulation.  Seen by PT yesterday.  Telem shows SR.     LOS: 8 days    Itali Mckendry 02/08/2015

## 2015-02-08 NOTE — Progress Notes (Signed)
Physical Therapy Treatment Patient Details Name: Mercedes Thornton MRN: [id number] DOB: 1945-05-04 Today's Date: 02/08/2015    History of Present Illness 69 year old female, current smoker, with a PMH of COPD, DM, HLD, who presented to [facility] on 10/19 with nausea/vomiting, abdominal pain, chills and productive cough. Found to have a ruptured appendix with developing pelvic abscess and s/p appendectomy 10/19.  ETT 10/19 - 10/24 and developed Afib so started on amiodarone drip    PT Comments    Max encouragement from therapist and family (daughters) for pt to agree to ambulation. Min assist +2 for ambulation distance of ~70 feet with 2 HHA. Pt is unsteady and will possibly require RW for ambulation. Pt needs to ambulate more but she has not been agreeable to walking with nursing. At this time, will likely require 24 hour supervision/assist.   Follow Up Recommendations  Home health PT;Supervision/Assistance - 24 hour     Equipment Recommendations  Rolling walker with 5" wheels    Recommendations for Other Services       Precautions / Restrictions Precautions Precautions: Fall Precaution Comments: anterior R drain, monitor sats Restrictions Weight Bearing Restrictions: No    Mobility  Bed Mobility Overal bed mobility: Needs Assistance Bed Mobility: Supine to Sit;Sit to Supine     Supine to sit: Mod assist Sit to supine: Mod assist   General bed mobility comments: assist for scooting hip and raising trunk upright, assist for LEs onto bed due to abdominal pain with transfers. Increased time. Utilized bedpad for scooting, positioning.   Transfers Overall transfer level: Needs assistance Equipment used: 2 person hand held assist Transfers: Sit to/from Stand Sit to Stand: Min assist;+2 physical assistance;+2 safety/equipment;From elevated surface         General transfer comment: Assist to rise, stabilize. VCs safety.   Ambulation/Gait Ambulation/Gait assistance: Min assist;+2  physical assistance;+2 safety/equipment Ambulation Distance (Feet): 70 Feet Assistive device: 2 person hand held assist Gait Pattern/deviations: Step-through pattern;Decreased stride length;Staggering right;Staggering left;Drifts right/left     General Gait Details: unsteady with intermittent LOB requiring external assist. Remained on 4L O2 sats around 89% at lowest while ambulating. 1 brief standing rest break to work on breathing. Fatigues easily.   Stairs            Wheelchair Mobility    Modified Rankin (Stroke Patients Only)       Balance Overall balance assessment: Needs assistance         Standing balance support: Bilateral upper extremity supported;During functional activity Standing balance-Leahy Scale: Poor                      Cognition Arousal/Alertness: Awake/alert Behavior During Therapy: WFL for tasks assessed/performed Overall Cognitive Status: Within Functional Limits for tasks assessed                      Exercises      General Comments        Pertinent Vitals/Pain Pain Assessment: Faces Faces Pain Scale: Hurts little more Pain Location: surgical site with mobility Pain Descriptors / Indicators: Sore Pain Intervention(s): Monitored during session;Repositioned    Home Living                      Prior Function            PT Goals (current goals can now be found in the care plan section) Progress towards PT goals: Progressing toward goals (slowly)  Frequency  Min 3X/week    PT Plan Current plan remains appropriate    Co-evaluation             End of Session Equipment Utilized During Treatment: Oxygen Activity Tolerance: Patient limited by fatigue Patient left: in bed;with call bell/phone within reach;with bed alarm set;with family/visitor present     Time: 7414-2395 PT Time Calculation (min) (ACUTE ONLY): 23 min  Charges:  $Gait Training: 8-22 mins $Therapeutic Activity: 8-22 mins                     G Codes:      Weston Anna, MPT Pager: [phone]

## 2015-02-08 NOTE — Care Management Important Message (Signed)
Important Message  Patient Details  Name: Mercedes Thornton MRN: [id number] Date of Birth: 1945-05-23   Medicare Important Message Given:  Yes-second notification given    Shelda Altes 02/08/2015, 3:07 [place] Message  Patient Details  Name: Mercedes Thornton MRN: [id number] Date of Birth: 24-Mar-1946   Medicare Important Message Given:  Yes-second notification given    Shelda Altes 02/08/2015, 3:07 PM

## 2015-02-08 NOTE — Progress Notes (Signed)
Date: February 08, 2015 Chart reviewed for concurrent status and case management needs. Will continue to follow patient for changes and needs: Rhonda Davis, RN, BSN, CCM   [phone] 

## 2015-02-08 NOTE — Progress Notes (Signed)
Chief complaint is feeling weak. O2 weaned to 2L [place] with Sats 96. Productive cough of clear sputum. IS 748ml, encouraged. HR stable.Foley remains for diuresis. Tachypnea persists.

## 2015-02-08 NOTE — Progress Notes (Signed)
eLink Physician-Brief Progress Note Patient Name: Mercedes Thornton DOB: [birth year] MRN: [id number]   Date of Service  02/08/2015  HPI/Events of Note  Hypokalemia due to lasix  eICU Interventions  60 mEq KCl IV ordered Rounding MD to assess need for continued diuresis - presently on scheduled furosemide     Intervention Category Major Interventions: Electrolyte abnormality - evaluation and management  Merton Border 02/08/2015, 5:52 AM

## 2015-02-09 ENCOUNTER — Inpatient Hospital Stay (HOSPITAL_COMMUNITY): Payer: Medicare Other

## 2015-02-09 DIAGNOSIS — I313 Pericardial effusion (noninflammatory): Secondary | ICD-10-CM

## 2015-02-09 LAB — GLUCOSE, CAPILLARY
GLUCOSE-CAPILLARY: 218 mg/dL — AB (ref 65–99)
Glucose-Capillary: 137 mg/dL — ABNORMAL HIGH (ref 65–99)
Glucose-Capillary: 148 mg/dL — ABNORMAL HIGH (ref 65–99)
Glucose-Capillary: 152 mg/dL — ABNORMAL HIGH (ref 65–99)
Glucose-Capillary: 179 mg/dL — ABNORMAL HIGH (ref 65–99)
Glucose-Capillary: 245 mg/dL — ABNORMAL HIGH (ref 65–99)

## 2015-02-09 LAB — COMPREHENSIVE METABOLIC PANEL
ALBUMIN: 2.1 g/dL — AB (ref 3.5–5.0)
ALK PHOS: 69 U/L (ref 38–126)
ALT: 12 U/L — AB (ref 14–54)
AST: 19 U/L (ref 15–41)
Anion gap: 8 (ref 5–15)
BUN: 7 mg/dL (ref 6–20)
CALCIUM: 8.1 mg/dL — AB (ref 8.9–10.3)
CO2: 35 mmol/L — AB (ref 22–32)
CREATININE: 0.76 mg/dL (ref 0.44–1.00)
Chloride: 103 mmol/L (ref 101–111)
GFR calc Af Amer: >60 mL/min (ref >60)
GFR calc non Af Amer: >60 mL/min (ref >60)
GLUCOSE: 153 mg/dL — AB (ref 65–99)
Potassium: 3.3 mmol/L — ABNORMAL LOW (ref 3.5–5.1)
SODIUM: 146 mmol/L — AB (ref 135–145)
Total Bilirubin: 0.4 mg/dL (ref 0.3–1.2)
Total Protein: 6.5 g/dL (ref 6.5–8.1)

## 2015-02-09 LAB — CBC
HEMATOCRIT: 32.5 % — AB (ref 36.0–46.0)
Hemoglobin: 10.3 g/dL — ABNORMAL LOW (ref 12.0–15.0)
MCH: 28.4 pg (ref 26.0–34.0)
MCHC: 31.7 g/dL (ref 30.0–36.0)
MCV: 89.5 fL (ref 78.0–100.0)
PLATELETS: 675 10*3/uL — AB (ref 150–400)
RBC: 3.63 MIL/uL — ABNORMAL LOW (ref 3.87–5.11)
RDW: 15.4 % (ref 11.5–15.5)
WBC: 15.7 10*3/uL — ABNORMAL HIGH (ref 4.0–10.5)

## 2015-02-09 LAB — MAGNESIUM: Magnesium: 1.5 mg/dL — ABNORMAL LOW (ref 1.7–2.4)

## 2015-02-09 MED ORDER — MAGNESIUM SULFATE 2 GM/50ML IV SOLN
2.0000 g | Freq: Once | INTRAVENOUS | Status: AC
  Administered 2015-02-09: 2 g via INTRAVENOUS
  Filled 2015-02-09: qty 50

## 2015-02-09 MED ORDER — POTASSIUM CHLORIDE CRYS ER 20 MEQ PO TBCR
40.0000 meq | EXTENDED_RELEASE_TABLET | Freq: Four times a day (QID) | ORAL | Status: AC
  Administered 2015-02-09 (×3): 40 meq via ORAL
  Filled 2015-02-09 (×3): qty 2

## 2015-02-09 MED ORDER — FUROSEMIDE 10 MG/ML IJ SOLN
60.0000 mg | Freq: Two times a day (BID) | INTRAMUSCULAR | Status: DC
  Administered 2015-02-09 – 2015-02-10 (×4): 60 mg via INTRAVENOUS
  Filled 2015-02-09 (×4): qty 6

## 2015-02-09 NOTE — Progress Notes (Signed)
9 Days Post-Op  Subjective: Still having some SOB. No belly pain  Objective: Vital signs in last 24 hours: Temp:  [98.1 F (36.7 C)-99 F (37.2 C)] 98.6 F (37 C) (10/28 0400) Resp:  [17-35] 33 (10/28 0600) BP: (96-137)/(36-55) 116/47 mmHg (10/28 0600) SpO2:  [90 %-100 %] 90 % (10/28 0600) FiO2 (%):  [55 %] 55 % (10/27 0900) Weight:  [76.4 kg (168 lb 6.9 oz)-78.1 kg (172 lb 2.9 oz)] 78.1 kg (172 lb 2.9 oz) (10/28 0600) Last BM Date: 02/06/15  Intake/Output from previous day: 10/27 0701 - 10/28 0700 In: 2130.9 [P.O.:400; I.V.:1130.9; IV Piggyback:600] Out: 2800 [Urine:2800] Intake/Output this shift:    Resp: clear to auscultation bilaterally Cardio: regular rate and rhythm GI: soft, nontender. incisions look good. had bm this am  Lab Results:   Recent Labs  02/08/15 0430 02/09/15 0600  WBC 18.0* 15.7*  HGB 10.5* 10.3*  HCT 32.5* 32.5*  PLT 651* 675*   BMET  Recent Labs  02/08/15 0430 02/09/15 0600  NA 145 146*  K 2.9* 3.3*  CL 100* 103  CO2 35* 35*  GLUCOSE 153* 153*  BUN 6 7  CREATININE 0.68 0.76  CALCIUM 8.0* 8.1*   PT/INR No results for input(s): LABPROT, INR in the last 72 hours. ABG No results for input(s): PHART, HCO3 in the last 72 hours.  Invalid input(s): PCO2, PO2  Studies/Results: Dg Chest Port 1 View  02/08/2015  CLINICAL DATA:  Acute respiratory failure. EXAM: PORTABLE CHEST 1 VIEW COMPARISON:  02/07/2015 FINDINGS: A right PICC remains in place with tip projecting over the lower SVC. Cardiomediastinal silhouette is unchanged. Diffusely increased interstitial markings are stable to slightly increased compared to the prior study with more patchy opacities remaining in both lung bases. Small bilateral pleural effusions are not excluded. No pneumothorax is seen. IMPRESSION: 1. Diffuse interstitial prominence which may reflect a combination of underlying COPD and superimposed edema. 2. Patchy bibasilar opacities suggestive of atelectasis with  small pleural effusions not excluded. Electronically Signed   By: Logan Bores M.D.   On: 02/08/2015 07:45   Dg Chest Port 1 View  02/07/2015  CLINICAL DATA:  Productive cough. EXAM: PORTABLE CHEST 1 VIEW COMPARISON:  February 06, 2015. FINDINGS: Stable cardiomediastinal silhouette. No pneumothorax is noted. Right-sided PICC line is noted with tip in expected position of the SVC. Mild central pulmonary vascular congestion is noted with probable bilateral pulmonary edema. Stable left basilar opacity is noted consistent with atelectasis and effusion. Bony thorax is unremarkable. Nasogastric tube has been removed. IMPRESSION: Mild central pulmonary vascular congestion is noted with possible bilateral perihilar edema. Stable left basilar opacity is noted concerning for atelectasis and effusion. Electronically Signed   By: Marijo Conception, M.D.   On: 02/07/2015 10:17    Anti-infectives: Anti-infectives    Start     Dose/Rate Route Frequency Ordered Stop   02/02/15 1000  vancomycin (VANCOCIN) IVPB 1000 mg/200 mL premix  Status:  Discontinued     1,000 mg 200 mL/hr over 60 Minutes Intravenous Every 12 hours 02/02/15 0947 02/02/15 1049   02/01/15 2000  fluconazole (DIFLUCAN) IVPB 400 mg  Status:  Discontinued     400 mg 100 mL/hr over 120 Minutes Intravenous Every 24 hours 01/31/15 2033 02/04/15 1442   02/01/15 1200  imipenem-cilastatin (PRIMAXIN) 500 mg in sodium chloride 0.9 % 100 mL IVPB     500 mg 200 mL/hr over 30 Minutes Intravenous 4 times per day 02/01/15 1143     02/01/15  1000  vancomycin (VANCOCIN) 1,250 mg in sodium chloride 0.9 % 250 mL IVPB  Status:  Discontinued     1,250 mg 166.7 mL/hr over 90 Minutes Intravenous Every 24 hours 01/31/15 2033 02/02/15 0947   02/01/15 0600  ciprofloxacin (CIPRO) IVPB 400 mg  Status:  Discontinued     400 mg 200 mL/hr over 60 Minutes Intravenous Every 12 hours 01/31/15 1952 01/31/15 2019   01/31/15 2300  vancomycin (VANCOCIN) 1,250 mg in sodium chloride  0.9 % 250 mL IVPB     1,250 mg 166.7 mL/hr over 90 Minutes Intravenous  Once 01/31/15 2033 01/31/15 2353   01/31/15 2300  fluconazole (DIFLUCAN) IVPB 800 mg     800 mg 200 mL/hr over 120 Minutes Intravenous  Once 01/31/15 2033 02/01/15 0024   01/31/15 2200  ciprofloxacin (CIPRO) IVPB 400 mg  Status:  Discontinued     400 mg 200 mL/hr over 60 Minutes Intravenous Every 12 hours 01/31/15 1559 01/31/15 1952   01/31/15 2200  metroNIDAZOLE (FLAGYL) IVPB 500 mg  Status:  Discontinued     500 mg 100 mL/hr over 60 Minutes Intravenous Every 8 hours 01/31/15 1952 01/31/15 2019   01/31/15 2200  imipenem-cilastatin (PRIMAXIN) 500 mg in sodium chloride 0.9 % 100 mL IVPB  Status:  Discontinued     500 mg 200 mL/hr over 30 Minutes Intravenous 3 times per day 01/31/15 2033 02/01/15 1143   01/31/15 2000  metroNIDAZOLE (FLAGYL) IVPB 500 mg  Status:  Discontinued     500 mg 100 mL/hr over 60 Minutes Intravenous 3 times per day 01/31/15 1559 01/31/15 1952   01/31/15 1800  metroNIDAZOLE (FLAGYL) IVPB 500 mg  Status:  Discontinued     500 mg 100 mL/hr over 60 Minutes Intravenous 4 times per day 01/31/15 1543 01/31/15 1559   01/31/15 1215  metroNIDAZOLE (FLAGYL) IVPB 500 mg     500 mg 100 mL/hr over 60 Minutes Intravenous  Once 01/31/15 1201 01/31/15 1350   01/31/15 1100  cefTRIAXone (ROCEPHIN) 1 g in dextrose 5 % 50 mL IVPB     1 g 100 mL/hr over 30 Minutes Intravenous  Once 01/31/15 1051 01/31/15 1230      Assessment/Plan: s/p Procedure(s): LYSIS OF ADHESIONS /APPENDECTOMY LAPAROSCOPIC  (N/A) Advance diet  Continue pulmonary management per CCM OOB Primaxin day 8. rescan fluid collections next week. Wbc trending down  LOS: 9 days    TOTH III,PAUL S 02/09/2015

## 2015-02-09 NOTE — Evaluation (Signed)
Occupational Therapy Evaluation Patient Details Name: Mercedes Thornton MRN: [id number] DOB: 24-Mar-1946 Today's Date: 02/09/2015    History of Present Illness 69 year old female, current smoker, with a PMH of COPD, DM, HLD, who presented to [facility] on 10/19 with nausea/vomiting, abdominal pain, chills and productive cough. Found to have a ruptured appendix with developing pelvic abscess and s/p appendectomy 10/19.  ETT 10/19 - 10/24 and developed Afib so started on amiodarone drip   Clinical Impression   Pt was admitted for the above. She was independent with adls prior to admission and she currently needs min A for SPT, +2 min A for mobility for safety due to lines and up to max A for ADLs.  Pt will benefit from skilled OT.  Goals in acute are for min guard to supervision level.      Follow Up Recommendations  Supervision/Assistance - 24 hour;Home health OT (vs SNF/LTACH)    Equipment Recommendations   (possibly 3:1 commode; to be further assessed)    Recommendations for Other Services       Precautions / Restrictions Precautions Precautions: Fall Precaution Comments: anterior R drain, monitor sats Restrictions Weight Bearing Restrictions: No      Mobility Bed Mobility           Sit to supine: Mod assist;+2 for safety/equipment   General bed mobility comments: second person to guide trunk and watch lines; assist for LEs  Transfers   Equipment used: 1 person hand held assist Transfers: Sit to/from [facility] Sit to Stand: Min assist Stand pivot transfers: Min assist       General transfer comment: assist to rise and stabilize.    Balance           Standing balance support: Bilateral upper extremity supported;During functional activity Standing balance-Leahy Scale: Poor                              ADL Overall ADL's : Needs assistance/impaired     Grooming: Set up;Sitting   Upper Body Bathing: Min guard;Sitting (manipulate  lines)   Lower Body Bathing: Moderate assistance;Sit to/from stand   Upper Body Dressing : Minimal assistance;Sitting (lines)   Lower Body Dressing: Maximal assistance;Sit to/from stand   Toilet Transfer: Minimal assistance;Stand-pivot;BSC   Toileting- Clothing Manipulation and Hygiene: Maximal assistance;Sit to/from stand         General ADL Comments: pt was finishing bathing with NT when OT arrived.  She transferrred from 3:1 back to bed with min A and cues (see cognition).  Pt was not interested in seeing AE at this time.  Pt on 6 liters 02; sats 95%     Vision     Perception     Praxis      Pertinent Vitals/Pain Pain Assessment: Faces Faces Pain Scale: Hurts a little bit Pain Location: surgical site Pain Descriptors / Indicators: Sore Pain Intervention(s): Limited activity within patient's tolerance;Monitored during session;Repositioned     Hand Dominance     Extremity/Trunk Assessment Upper Extremity Assessment Upper Extremity Assessment: Overall WFL for tasks assessed           Communication Communication Communication: No difficulties   Cognition Arousal/Alertness: Awake/alert Behavior During Therapy: Flat affect Overall Cognitive Status: No family/caregiver present to determine baseline cognitive functioning Area of Impairment: Following commands               General Comments: restated commands at times and also restated pt needed  to stand still while NT finished washing her buttocks:  she asked me to move twice while this was being done   General Comments       Exercises       Shoulder Instructions      Home Living Family/patient expects to be discharged to:: Unsure Living Arrangements: Alone                               Additional Comments: pt has a standard commode and tub shower at home:  no DME      Prior Functioning/Environment Level of Independence: Independent             OT Diagnosis: Generalized  weakness   OT Problem List: Decreased strength;Decreased activity tolerance;Impaired balance (sitting and/or standing);Decreased knowledge of use of DME or AE;Pain;Decreased cognition   OT Treatment/Interventions: Self-care/ADL training;DME and/or AE instruction;Balance training;Patient/family education;Energy conservation;Cognitive remediation/compensation    OT Goals(Current goals can be found in the care plan section) Acute Rehab OT Goals Patient Stated Goal: none stated OT Goal Formulation: With patient Time For Goal Achievement: 02/23/15 Potential to Achieve Goals: Good ADL Goals Pt Will Perform Grooming: with supervision;standing Pt Will Perform Lower Body Bathing: with supervision;with adaptive equipment;sit to/from stand Pt Will Perform Lower Body Dressing: with supervision;with adaptive equipment;sit to/from stand Pt Will Transfer to Toilet: with min guard assist;ambulating;bedside commode Pt Will Perform Toileting - Clothing Manipulation and hygiene: sit to/from stand Additional ADL Goal #1: Pt will follow all commands with initial cue and not need any cues for safety during ADLs/toilet transfers  OT Frequency: Min 2X/week   Barriers to D/C:            Co-evaluation              End of Session    Activity Tolerance: Patient limited by fatigue Patient left: in bed;with call bell/phone within reach;with bed alarm set;with family/visitor present   Time: 8325-4982 OT Time Calculation (min): 20 min Charges:  OT General Charges $OT Visit: 1 Procedure OT Evaluation $Initial OT Evaluation Tier I: 1 Procedure G-Codes:    Dhillon [place] 03-07-15, 3:42 PM  Lesle Chris, OTR/L [phone] 07-Mar-2015

## 2015-02-09 NOTE — Progress Notes (Signed)
[facility] PROGRESS NOTE  Mercedes Thornton AYT:[id number] DOB: 09/08/1945 DOA: 01/31/2015 PCP: Antonietta Jewel, MD  Assessment/Plan: 1. Acute hypoxemic respiratory failure. -Postoperatively patient requiring ventilator support -Could be secondary to volume overload with imaging studies revealing acute pulmonary edema. Patient also having a history of structural lung disease in setting of chronic obstructive lung disease. -Repeat chest x-ray performed on 02/08/2015 showing diffuse interstitial prominence which could reflect combination of COPD and edema. Atelectasis. -On 02/09/2015 patient having increase in oxygen requirement. On lung exam she had increasing bibasilar crackles is a suspect this may be related to pulmonary edema. She was given 60 mg of IV Lasix this morning. Two-view chest x-ray has been ordered and is pending at the time of this dictation. Her white count is trending down, doubt this is secondary to healthcare associated pneumonia. -Encourage incentive spirometry  2.  Ruptured appendicitis -Patient undergoing laparoscopic appendectomy with evacuation of pelvic abscess on 01/31/2015, procedure performed by Dr. Lucia Gaskins of general surgery. -Postoperative course, complicated by sepsis, she remains on antibiotic therapy with Primaxin 500 mg IV every 6 hours. -Surgery following. White count trending down.  3.  Sepsis -Present on admission, source of infection likely ruptured appendix. Sepsis evidenced by the presence of shock, requiring IV pressor support, acute respiratory failure requiring ventilator support. -Showing clinical improvement. -Blood cultures obtained on 01/31/2015 at 02/03/2015 showing no growth -Remains on Primaxin  4.  Pulmonary edema -Likely contributing to rotatory failure -Transthoracic echocardiogram performed on 02/02/2015 showing preserved systolic function  -I increased her Lasix to 60 mg IV twice a day, two-view chest x-ray pending at the time of this  dictation.  5.  Pericardial effusion. -Patient having a moderate pericardial effusion seen on transthoracic echocardiogram from 02/02/2015. Echo did not reveal evidence of hemodynamic compromise -Pending limited echo to assess pericardial effusion  6.  A. Fib -Patient's hospital course, complicated by A. fib RVR requiring amiodarone drip -She converted to sinus rhythm, will discontinue amiodarone drip, started Cardizem 120 mg by mouth daily as she has converted to sinus rhythm  7.  Hypokalemia -A.m. labs on 02/08/2015 showing potassium of 2.9 for which she was administered IV potassium and placement -Repeat lab work on 02/09/2015 showing improvement potassium of 3.3, will replace with oral potassium 40 meq PO x 3  8. Hypomagnesemia -A.m. labs on 02/08/2015 showing magnesium 1.5, will replace with 2 g of IV magnesium today -Repeat lab work on 02/09/2015 showing magnesium of 1.5, will replace with 2 g of IV magnesium  Code Status: Full code Family Communication:  Disposition Plan: Continue close monitoring in the intensive care unit.   Consultants:  Pulmonary critical care medicine  General surgery  Procedures:  Laparoscopic appendectomy procedure performed on 01/31/2015  Antibiotics:  Permaxin  HPI/Subjective: Mercedes Thornton is a 69 year old female with a past medical history of chronic objective pulmonary disease, diabetes mellitus, dyslipidemia, admitted on 01/31/2015 presenting with nausea/vomiting/abdominal pain with CT scan of abdomen and pelvis revealing a ruptured appendicitis with developing pelvic abscess. She was taken to the operating room on 01/31/2015 where she underwent laparoscopic appendectomy and evacuation of pelvic abscesses. Procedure was performed by Dr.Newman of general surgery. Postoperatively she went into acute hypoxemic respiratory failure and required ventilatory support. She had sepsis with hypotension requiring IV pressor support. She was treated with  broad-spectrum IV and microbial therapy with Primaxin, ciprofloxacin and IV vancomycin. She showed gradual clinical improvement and was extubated on 02/04/2015.  Objective: Filed Vitals:   02/09/15 1000  BP: 123/39  Pulse:  Temp:   Resp: 34    Intake/Output Summary (Last 24 hours) at 02/09/15 1136 Last data filed at 02/09/15 1057  Gross per 24 hour  Intake 1744.16 ml  Output   3800 ml  Net -2055.84 ml   Filed Weights   02/07/15 0452 02/08/15 1100 02/09/15 0600  Weight: 78 kg (171 lb 15.3 oz) 76.4 kg (168 lb 6.9 oz) 78.1 kg (172 lb 2.9 oz)    Exam:   General:  Patient is awake and alert oriented, states feeling better, she was assisted out of bed to chair  Cardiovascular: Regular rate rhythm normal S1-S2  Respiratory: Diminished breath sounds to bases bilaterally, few tori wheezes, positive crackles at bases more pronounced today.  Abdomen: Surgical incision sites appears clean   Musculoskeletal: trace edema to lower extremities.   Data Reviewed: Basic Metabolic Panel:  Recent Labs Lab 02/05/15 0431 02/05/15 1054  02/05/15 1615 02/06/15 0500 02/07/15 0442 02/08/15 0430 02/09/15 0600  NA 147* 147*  < > 146* 150* 150* 145 146*  K 2.4* 4.7  < > 2.7* 3.1* 2.9* 2.9* 3.3*  CL 103 108  < > 100* 104 104 100* 103  CO2 31 29  < > 31 33* 35* 35* 35*  GLUCOSE 149* 180*  < > 198* 148* 163* 153* 153*  BUN 12 10  < > 11 9 7 6 7   CREATININE 0.72 0.68  < > 0.77 0.68 0.68 0.68 0.76  CALCIUM 7.6* 6.6*  < > 7.6* 7.8* 8.0* 8.0* 8.1*  MG 1.8 1.2*  --  1.3* 2.1 1.4* 1.5* 1.5*  PHOS 3.1 2.3*  --   --  2.6 3.3 3.6  --   < > = values in this interval not displayed. Liver Function Tests:  Recent Labs Lab 02/09/15 0600  AST 19  ALT 12*  ALKPHOS 69  BILITOT 0.4  PROT 6.5  ALBUMIN 2.1*   No results for input(s): LIPASE, AMYLASE in the last 168 hours. No results for input(s): AMMONIA in the last 168 hours. CBC:  Recent Labs Lab 02/03/15 0500  02/05/15 1054  02/06/15 0500 02/07/15 0442 02/08/15 0430 02/09/15 0600  WBC 31.5*  < > 28.9* 23.3* 17.6* 18.0* 15.7*  NEUTROABS 26.5*  --  25.2*  --   --   --   --   HGB 11.2*  < > 8.9* 10.4* 10.4* 10.5* 10.3*  HCT 35.1*  < > 27.0* 32.2* 32.5* 32.5* 32.5*  MCV 89.5  < > 87.1 87.5 88.6 88.3 89.5  PLT 447*  < > 547* 567* 627* 651* 675*  < > = values in this interval not displayed. Cardiac Enzymes: No results for input(s): CKTOTAL, CKMB, CKMBINDEX, TROPONINI in the last 168 hours. BNP (last 3 results)  Recent Labs  02/01/15 1530  BNP 487.5*    ProBNP (last 3 results) No results for input(s): PROBNP in the last 8760 hours.  CBG:  Recent Labs Lab 02/08/15 1540 02/08/15 2021 02/08/15 2302 02/09/15 0531 02/09/15 0808  GLUCAP 174* 127* 156* 148* 137*    Recent Results (from the past 240 hour(s))  Blood Culture (routine x 2)     Status: None   Collection Time: 01/31/15 11:50 AM  Result Value Ref Range Status   Specimen Description BLOOD LEFT ANTECUBITAL  Final   Special Requests BOTTLES DRAWN AEROBIC AND ANAEROBIC 5CC  Final   Culture   Final    NO GROWTH 5 DAYS Performed at [facility]    Report Status 02/05/2015 FINAL  Final  Blood Culture (routine x 2)     Status: None   Collection Time: 01/31/15 12:02 PM  Result Value Ref Range Status   Specimen Description BLOOD LEFT HAND  Final   Special Requests IN PEDIATRIC BOTTLE [place]  Final   Culture   Final    NO GROWTH 5 DAYS Performed at [facility]    Report Status 02/05/2015 FINAL  Final  Urine culture     Status: None   Collection Time: 01/31/15  2:00 PM  Result Value Ref Range Status   Specimen Description URINE, CATHETERIZED  Final   Special Requests NONE  Final   Culture   Final    NO GROWTH 1 DAY Performed at [facility]    Report Status 02/01/2015 FINAL  Final  MRSA PCR Screening     Status: None   Collection Time: 02/01/15 12:32 AM  Result Value Ref Range Status   MRSA by PCR NEGATIVE  NEGATIVE Final    Comment:        The GeneXpert MRSA Assay (FDA approved for NASAL specimens only), is one component of a comprehensive MRSA colonization surveillance program. It is not intended to diagnose MRSA infection nor to guide or monitor treatment for MRSA infections.   Culture, blood (routine x 2)     Status: None   Collection Time: 02/03/15  1:00 PM  Result Value Ref Range Status   Specimen Description BLOOD LEFT ANTECUBITAL  Final   Special Requests IN PEDIATRIC BOTTLE [place]  Final   Culture   Final    NO GROWTH 5 DAYS Performed at [facility]    Report Status 02/08/2015 FINAL  Final  Culture, Urine     Status: None   Collection Time: 02/03/15  1:00 PM  Result Value Ref Range Status   Specimen Description URINE, CATHETERIZED  Final   Special Requests NONE  Final   Culture   Final    NO GROWTH 2 DAYS Performed at [facility]    Report Status 02/05/2015 FINAL  Final  C difficile quick scan w PCR reflex     Status: None   Collection Time: 02/06/15 10:41 PM  Result Value Ref Range Status   C Diff antigen NEGATIVE NEGATIVE Final   C Diff toxin NEGATIVE NEGATIVE Final   C Diff interpretation Negative for toxigenic C. difficile  Final     Studies: Dg Chest Port 1 View  02/08/2015  CLINICAL DATA:  Acute respiratory failure. EXAM: PORTABLE CHEST 1 VIEW COMPARISON:  02/07/2015 FINDINGS: A right PICC remains in place with tip projecting over the lower SVC. Cardiomediastinal silhouette is unchanged. Diffusely increased interstitial markings are stable to slightly increased compared to the prior study with more patchy opacities remaining in both lung bases. Small bilateral pleural effusions are not excluded. No pneumothorax is seen. IMPRESSION: 1. Diffuse interstitial prominence which may reflect a combination of underlying COPD and superimposed edema. 2. Patchy bibasilar opacities suggestive of atelectasis with small pleural effusions not excluded.  Electronically Signed   By: Logan Bores M.D.   On: 02/08/2015 07:45    Scheduled Meds: . antiseptic oral rinse  7 mL Mouth Rinse QID  . chlorhexidine gluconate  15 mL Mouth Rinse BID  . clonazepam  1 mg Oral BID  . diltiazem  120 mg Oral Daily  . famotidine  20 mg Oral BID AC  . feeding supplement  1 Container Oral TID BM  . furosemide  60 mg Intravenous  BID  . heparin subcutaneous  5,000 Units Subcutaneous 3 times per day  . imipenem-cilastatin  500 mg Intravenous 4 times per day  . insulin aspart  0-9 Units Subcutaneous 6 times per day  . ipratropium-albuterol  3 mL Nebulization TID  . potassium chloride SA  40 mEq Oral Q6H  . saccharomyces boulardii  250 mg Oral BID  . sodium chloride  10-40 mL Intracatheter Q12H   Continuous Infusions: . sodium chloride 10 mL/hr at 02/01/15 0940  . dextrose Stopped (02/02/15 0830)    Principal Problem:   Ruptured appendix Active Problems:   Perforated appendicitis   Hypoglycemia   Acute respiratory failure with hypoxemia   Sepsis ([place])   Acute respiratory failure with hypoxemia ([place])   Atrial fibrillation ([place])    Time spent: 35 min    Kelvin Cellar  [facility] Pager [phone]. If 7PM-7AM, please contact night-coverage at [email or web address], password [facility] 02/09/2015, 11:36 AM  LOS: 9 days

## 2015-02-09 NOTE — Progress Notes (Signed)
  Echocardiogram 2D Echocardiogram has been performed.  Jennette Dubin 02/09/2015, 12:11 PM

## 2015-02-09 NOTE — Progress Notes (Signed)
ANTIBIOTIC CONSULT NOTE - Follow-up  Pharmacy Consult for imipenem Indication: Ruptured appendix with pelvic abscesses  Allergies  Allergen Reactions  . Penicillins Hives and Swelling    Has patient had a PCN reaction causing immediate rash, facial/tongue/throat swelling, SOB or lightheadedness with hypotension: Yes Has patient had a PCN reaction causing severe rash involving mucus membranes or skin necrosis: Yes Has patient had a PCN reaction that required hospitalization Yes- hospital for 7 days Has patient had a PCN reaction occurring within the last 10 years: No If all of the above answers are "NO", then may proceed with Cephalosporin use.     Patient Measurements: Height: 5\' 10"  (177.8 cm) Weight: 172 lb 2.9 oz (78.1 kg) IBW/kg (Calculated) : 68.5 Adjusted Body Weight:   Vital Signs: Temp: 97.9 F (36.6 C) (10/28 0810) Temp Source: Oral (10/28 0810) BP: 127/96 mmHg (10/28 1200) Intake/Output from previous day: 10/27 0701 - 10/28 0700 In: 2140.9 [P.O.:400; I.V.:1140.9; IV Piggyback:600] Out: 2800 [Urine:2800] Intake/Output from this shift: Total I/O In: 100 [I.V.:50; IV Piggyback:50] Out: 1300 [Urine:1300]  Labs:  Recent Labs  02/07/15 0442 02/08/15 0430 02/09/15 0600  WBC 17.6* 18.0* 15.7*  HGB 10.4* 10.5* 10.3*  PLT 627* 651* 675*  CREATININE 0.68 0.68 0.76   Estimated Creatinine Clearance: 72.8 mL/min (by C-G formula based on Cr of 0.76). No results for input(s): VANCOTROUGH, VANCOPEAK, VANCORANDOM, GENTTROUGH, GENTPEAK, GENTRANDOM, TOBRATROUGH, TOBRAPEAK, TOBRARND, AMIKACINPEAK, AMIKACINTROU, AMIKACIN in the last 72 hours.   Microbiology: Recent Results (from the past 720 hour(s))  Blood Culture (routine x 2)     Status: None   Collection Time: 01/31/15 11:50 AM  Result Value Ref Range Status   Specimen Description BLOOD LEFT ANTECUBITAL  Final   Special Requests BOTTLES DRAWN AEROBIC AND ANAEROBIC 5CC  Final   Culture   Final    NO GROWTH 5  DAYS Performed at [facility]    Report Status 02/05/2015 FINAL  Final  Blood Culture (routine x 2)     Status: None   Collection Time: 01/31/15 12:02 PM  Result Value Ref Range Status   Specimen Description BLOOD LEFT HAND  Final   Special Requests IN PEDIATRIC BOTTLE [place]  Final   Culture   Final    NO GROWTH 5 DAYS Performed at [facility]    Report Status 02/05/2015 FINAL  Final  Urine culture     Status: None   Collection Time: 01/31/15  2:00 PM  Result Value Ref Range Status   Specimen Description URINE, CATHETERIZED  Final   Special Requests NONE  Final   Culture   Final    NO GROWTH 1 DAY Performed at [facility]    Report Status 02/01/2015 FINAL  Final  MRSA PCR Screening     Status: None   Collection Time: 02/01/15 12:32 AM  Result Value Ref Range Status   MRSA by PCR NEGATIVE NEGATIVE Final    Comment:        The GeneXpert MRSA Assay (FDA approved for NASAL specimens only), is one component of a comprehensive MRSA colonization surveillance program. It is not intended to diagnose MRSA infection nor to guide or monitor treatment for MRSA infections.   Culture, blood (routine x 2)     Status: None   Collection Time: 02/03/15  1:00 PM  Result Value Ref Range Status   Specimen Description BLOOD LEFT ANTECUBITAL  Final   Special Requests IN PEDIATRIC BOTTLE [place]  Final   Culture  Final    NO GROWTH 5 DAYS Performed at [facility]    Report Status 02/08/2015 FINAL  Final  Culture, Urine     Status: None   Collection Time: 02/03/15  1:00 PM  Result Value Ref Range Status   Specimen Description URINE, CATHETERIZED  Final   Special Requests NONE  Final   Culture   Final    NO GROWTH 2 DAYS Performed at [facility]    Report Status 02/05/2015 FINAL  Final  C difficile quick scan w PCR reflex     Status: None   Collection Time: 02/06/15 10:41 PM  Result Value Ref Range Status   C Diff antigen NEGATIVE NEGATIVE Final    C Diff toxin NEGATIVE NEGATIVE Final   C Diff interpretation Negative for toxigenic C. difficile  Final    Medical History: Past Medical History  Diagnosis Date  . Leukocytosis   . COPD (chronic obstructive pulmonary disease) ([place])   . Allergic rhinitis   . DM (diabetes mellitus) ([place])   . Bipolar affective disorder ([place])   . Hyperlipidemia   . Smoking   . Cough 10/30/2011  . SOB (shortness of breath) 10/30/2011  . Mediastinal adenopathy 11/26/2011   Assessment: 61 yoF presents with abd pain and N/V.  Found to have ruptured appendix and multiple abscesses s/p laparascopic appendectomy with evacuation of pelvic abscess on 10/19.  Post-op course complicated by sepsis. Currently on D10 of primaxin   Ceftriaxone x1 in ED  10/19 >> cipro >> 10/19 10/19 >> metronidazole >> 10/19 10/19 >> primaxin >> 10/19 >> vancomycin >> 10/21 10/19 >> fluconazole >> 10/23  PCT trending down  10/19 urine: NG 10/19 blood: NGTD 10/22 blood: NGTD 10/22 urine (UA neg):   Today, 02/09/2015: Temp: Afebrile WBC: trending down slowly Renal: SCr stable wnl; CrCl 73 CG  Repeat CT shows post appendectomy with scattered inflammatory changes in the pelvis, including bowel wall thickening and enhancement of multiple small bowel loops as well has a loculated fluid collection.  Goal of Therapy:  Dose for indication and for patient-specific parameters  Plan:  Day #10 abx  Continue Primaxin 500mg  IV q6h.   F/u renal function, clinical course; LOT/de-escalation  Ralene Bathe, PharmD, BCPS 02/09/2015, 12:38 PM  Pager: [phone]

## 2015-02-10 LAB — GLUCOSE, CAPILLARY
GLUCOSE-CAPILLARY: 244 mg/dL — AB (ref 65–99)
GLUCOSE-CAPILLARY: 218 mg/dL — AB (ref 65–99)
Glucose-Capillary: 110 mg/dL — ABNORMAL HIGH (ref 65–99)
Glucose-Capillary: 154 mg/dL — ABNORMAL HIGH (ref 65–99)
Glucose-Capillary: 234 mg/dL — ABNORMAL HIGH (ref 65–99)

## 2015-02-10 LAB — CBC
HEMATOCRIT: 33.1 % — AB (ref 36.0–46.0)
HEMOGLOBIN: 10.6 g/dL — AB (ref 12.0–15.0)
MCH: 28.6 pg (ref 26.0–34.0)
MCHC: 32 g/dL (ref 30.0–36.0)
MCV: 89.5 fL (ref 78.0–100.0)
PLATELETS: 696 10*3/uL — AB (ref 150–400)
RBC: 3.7 MIL/uL — AB (ref 3.87–5.11)
RDW: 15.4 % (ref 11.5–15.5)
WBC: 15.2 10*3/uL — ABNORMAL HIGH (ref 4.0–10.5)

## 2015-02-10 LAB — BASIC METABOLIC PANEL
Anion gap: 7 (ref 5–15)
BUN: 10 mg/dL (ref 6–20)
CHLORIDE: 104 mmol/L (ref 101–111)
CO2: 33 mmol/L — AB (ref 22–32)
CREATININE: 0.68 mg/dL (ref 0.44–1.00)
Calcium: 8.3 mg/dL — ABNORMAL LOW (ref 8.9–10.3)
GFR calc non Af Amer: >60 mL/min (ref >60)
Glucose, Bld: 136 mg/dL — ABNORMAL HIGH (ref 65–99)
Potassium: 4 mmol/L (ref 3.5–5.1)
Sodium: 144 mmol/L (ref 135–145)

## 2015-02-10 NOTE — Progress Notes (Signed)
10 Days Post-Op  Subjective: Having bowel function, no real complaints, no n/v  Objective: Vital signs in last 24 hours: Temp:  [97.6 F (36.4 C)-98.7 F (37.1 C)] 98.4 F (36.9 C) (10/29 0420) Resp:  [0-34] 28 (10/29 0600) BP: (108-128)/(39-96) 121/51 mmHg (10/29 0600) SpO2:  [84 %-97 %] 89 % (10/29 0600) Last BM Date: 02/09/15  Intake/Output from previous day: 10/28 0701 - 10/29 0700 In: 880 [P.O.:200; I.V.:230; IV Piggyback:450] Out: 2710 [Urine:2700; Drains:10] Intake/Output this shift:    General appearance: no distress Resp: diminished breath sounds bibasilar Cardio: regular rate and rhythm GI: incisions  clean, drain with nothing it in now, soft bs present  Lab Results:   Recent Labs  02/09/15 0600 02/10/15 0515  WBC 15.7* 15.2*  HGB 10.3* 10.6*  HCT 32.5* 33.1*  PLT 675* 696*   BMET  Recent Labs  02/09/15 0600 02/10/15 0515  NA 146* 144  K 3.3* 4.0  CL 103 104  CO2 35* 33*  GLUCOSE 153* 136*  BUN 7 10  CREATININE 0.76 0.68  CALCIUM 8.1* 8.3*   PT/INR No results for input(s): LABPROT, INR in the last 72 hours. ABG No results for input(s): PHART, HCO3 in the last 72 hours.  Invalid input(s): PCO2, PO2  Studies/Results: Dg Chest 2 View  02/09/2015  CLINICAL DATA:  Short of breath EXAM: CHEST  2 VIEW COMPARISON:  02/08/2015 FINDINGS: Mild cardiomegaly. Small bilateral effusions are not significantly changed. Diffuse interstitial lung disease is improved. No pneumothorax. Stable right PICC. IMPRESSION: Stable small pleural effusions. Diffuse interstitial disease is improved likely due to improving edema. Electronically Signed   By: Marybelle Killings M.D.   On: 02/09/2015 11:51    Anti-infectives: Anti-infectives    Start     Dose/Rate Route Frequency Ordered Stop   02/02/15 1000  vancomycin (VANCOCIN) IVPB 1000 mg/200 mL premix  Status:  Discontinued     1,000 mg 200 mL/hr over 60 Minutes Intravenous Every 12 hours 02/02/15 0947 02/02/15 1049   02/01/15 2000  fluconazole (DIFLUCAN) IVPB 400 mg  Status:  Discontinued     400 mg 100 mL/hr over 120 Minutes Intravenous Every 24 hours 01/31/15 2033 02/04/15 1442   02/01/15 1200  imipenem-cilastatin (PRIMAXIN) 500 mg in sodium chloride 0.9 % 100 mL IVPB     500 mg 200 mL/hr over 30 Minutes Intravenous 4 times per day 02/01/15 1143     02/01/15 1000  vancomycin (VANCOCIN) 1,250 mg in sodium chloride 0.9 % 250 mL IVPB  Status:  Discontinued     1,250 mg 166.7 mL/hr over 90 Minutes Intravenous Every 24 hours 01/31/15 2033 02/02/15 0947   02/01/15 0600  ciprofloxacin (CIPRO) IVPB 400 mg  Status:  Discontinued     400 mg 200 mL/hr over 60 Minutes Intravenous Every 12 hours 01/31/15 1952 01/31/15 2019   01/31/15 2300  vancomycin (VANCOCIN) 1,250 mg in sodium chloride 0.9 % 250 mL IVPB     1,250 mg 166.7 mL/hr over 90 Minutes Intravenous  Once 01/31/15 2033 01/31/15 2353   01/31/15 2300  fluconazole (DIFLUCAN) IVPB 800 mg     800 mg 200 mL/hr over 120 Minutes Intravenous  Once 01/31/15 2033 02/01/15 0024   01/31/15 2200  ciprofloxacin (CIPRO) IVPB 400 mg  Status:  Discontinued     400 mg 200 mL/hr over 60 Minutes Intravenous Every 12 hours 01/31/15 1559 01/31/15 1952   01/31/15 2200  metroNIDAZOLE (FLAGYL) IVPB 500 mg  Status:  Discontinued     500  mg 100 mL/hr over 60 Minutes Intravenous Every 8 hours 01/31/15 1952 01/31/15 2019   01/31/15 2200  imipenem-cilastatin (PRIMAXIN) 500 mg in sodium chloride 0.9 % 100 mL IVPB  Status:  Discontinued     500 mg 200 mL/hr over 30 Minutes Intravenous 3 times per day 01/31/15 2033 02/01/15 1143   01/31/15 2000  metroNIDAZOLE (FLAGYL) IVPB 500 mg  Status:  Discontinued     500 mg 100 mL/hr over 60 Minutes Intravenous 3 times per day 01/31/15 1559 01/31/15 1952   01/31/15 1800  metroNIDAZOLE (FLAGYL) IVPB 500 mg  Status:  Discontinued     500 mg 100 mL/hr over 60 Minutes Intravenous 4 times per day 01/31/15 1543 01/31/15 1559   01/31/15 1215   metroNIDAZOLE (FLAGYL) IVPB 500 mg     500 mg 100 mL/hr over 60 Minutes Intravenous  Once 01/31/15 1201 01/31/15 1350   01/31/15 1100  cefTRIAXone (ROCEPHIN) 1 g in dextrose 5 % 50 mL IVPB     1 g 100 mL/hr over 30 Minutes Intravenous  Once 01/31/15 1051 01/31/15 1230      Assessment/Plan: POD 9 lap appy   Would continue abx and regular diet Follow wbc and exam Given collections and Im not sure the surgical drain is taking care of them on last ct scan would plan re-ct at a week or so after last one.  Could do in next 48 hours depending on how she continues to respond to abx to make sure there is nothing that can be drained.  [facility] 02/10/2015

## 2015-02-10 NOTE — Progress Notes (Signed)
[facility] PROGRESS NOTE  Mercedes Thornton XBJ:[id number] DOB: [birth year] DOA: 01/31/2015 PCP: Antonietta Jewel, MD  Assessment/Plan: 1. Acute hypoxemic respiratory failure. -Postoperatively patient requiring ventilator support -Could be secondary to volume overload with imaging studies revealing acute pulmonary edema. Patient also having a history of structural lung disease in setting of chronic obstructive lung disease. -Repeat chest x-ray performed on 02/08/2015 showing diffuse interstitial prominence which could reflect combination of COPD and edema. Atelectasis. -Her respiratory status seems improved within the last 24 hours with IV diuresis as she was taken off the mark and is on [place].   2.  Ruptured appendicitis -Patient undergoing laparoscopic appendectomy with evacuation of pelvic abscess on 01/31/2015, procedure performed by Dr. Lucia Gaskins of general surgery. -Postoperative course, complicated by sepsis, she remains on antibiotic therapy with Primaxin 500 mg IV every 6 hours. -Surgery following. White count trending down. -Plan to re-image her next week.  3.  Sepsis -Present on admission, source of infection likely ruptured appendix. Sepsis evidenced by the presence of shock, requiring IV pressor support, acute respiratory failure requiring ventilator support. -Showing clinical improvement. -Blood cultures obtained on 01/31/2015 at 02/03/2015 showing no growth -Remains on Primaxin   4.  Pulmonary edema -Likely contributing to rotatory failure -Transthoracic echocardiogram performed on 02/02/2015 showing preserved systolic function  -I increased her Lasix to 60 mg IV twice a day, two-view chest x-ray pending at the time of this dictation.  5.  Pericardial effusion. -Patient having a moderate pericardial effusion seen on transthoracic echocardiogram from 02/02/2015. Echo did not reveal evidence of hemodynamic compromise -Repeat transthoracic echocardiogram performed on 02/09/2015  showing small to moderate pleural effusion without evidence of hemodynamic compromise.  6.  A. Fib -Patient's hospital course, complicated by A. fib RVR requiring amiodarone drip -She converted to sinus rhythm, will discontinue amiodarone drip, started Cardizem 120 mg by mouth daily as she has converted to sinus rhythm  7.  Hypokalemia -Testim improved to 4.0 after receiving replacement  8. Hypomagnesemia -Replaced  Code Status: Full code Family Communication:  Disposition Plan: Continue close monitoring in the intensive care unit.   Consultants:  Pulmonary critical care medicine  General surgery  Procedures:  Laparoscopic appendectomy procedure performed on 01/31/2015  Antibiotics:  Permaxin  HPI/Subjective: Mercedes Thornton is a 69 year old female with a past medical history of chronic objective pulmonary disease, diabetes mellitus, dyslipidemia, admitted on 01/31/2015 presenting with nausea/vomiting/abdominal pain with CT scan of abdomen and pelvis revealing a ruptured appendicitis with developing pelvic abscess. She was taken to the operating room on 01/31/2015 where she underwent laparoscopic appendectomy and evacuation of pelvic abscesses. Procedure was performed by Dr.Newman of general surgery. Postoperatively she went into acute hypoxemic respiratory failure and required ventilatory support. She had sepsis with hypotension requiring IV pressor support. She was treated with broad-spectrum IV and microbial therapy with Primaxin, ciprofloxacin and IV vancomycin. She showed gradual clinical improvement and was extubated on 02/04/2015.  Objective: Filed Vitals:   02/10/15 1700  BP:   Pulse:   Temp: 97.8 F (36.6 C)  Resp:     Intake/Output Summary (Last 24 hours) at 02/10/15 1736 Last data filed at 02/10/15 0600  Gross per 24 hour  Intake    330 ml  Output   1410 ml  Net  -1080 ml   Filed Weights   02/07/15 0452 02/08/15 1100 02/09/15 0600  Weight: 78 kg (171 lb 15.3  oz) 76.4 kg (168 lb 6.9 oz) 78.1 kg (172 lb 2.9 oz)    Exam:   General:  Patient having no complaints, states feeling better from a respiratory standpoint.  Cardiovascular: Regular rate rhythm normal S1-S2  Respiratory: Improved lung sounds, decreased bibasilar rales and crackles  Abdomen: Surgical incision sites appears clean   Musculoskeletal: trace edema to lower extremities.   Data Reviewed: Basic Metabolic Panel:  Recent Labs Lab 02/05/15 0431 02/05/15 1054  02/05/15 1615 02/06/15 0500 02/07/15 0442 02/08/15 0430 02/09/15 0600 02/10/15 0515  NA 147* 147*  < > 146* 150* 150* 145 146* 144  K 2.4* 4.7  < > 2.7* 3.1* 2.9* 2.9* 3.3* 4.0  CL 103 108  < > 100* 104 104 100* 103 104  CO2 31 29  < > 31 33* 35* 35* 35* 33*  GLUCOSE 149* 180*  < > 198* 148* 163* 153* 153* 136*  BUN 12 10  < > 11 9 7 6 7 10   CREATININE 0.72 0.68  < > 0.77 0.68 0.68 0.68 0.76 0.68  CALCIUM 7.6* 6.6*  < > 7.6* 7.8* 8.0* 8.0* 8.1* 8.3*  MG 1.8 1.2*  --  1.3* 2.1 1.4* 1.5* 1.5*  --   PHOS 3.1 2.3*  --   --  2.6 3.3 3.6  --   --   < > = values in this interval not displayed. Liver Function Tests:  Recent Labs Lab 02/09/15 0600  AST 19  ALT 12*  ALKPHOS 69  BILITOT 0.4  PROT 6.5  ALBUMIN 2.1*   No results for input(s): LIPASE, AMYLASE in the last 168 hours. No results for input(s): AMMONIA in the last 168 hours. CBC:  Recent Labs Lab 02/05/15 1054 02/06/15 0500 02/07/15 0442 02/08/15 0430 02/09/15 0600 02/10/15 0515  WBC 28.9* 23.3* 17.6* 18.0* 15.7* 15.2*  NEUTROABS 25.2*  --   --   --   --   --   HGB 8.9* 10.4* 10.4* 10.5* 10.3* 10.6*  HCT 27.0* 32.2* 32.5* 32.5* 32.5* 33.1*  MCV 87.1 87.5 88.6 88.3 89.5 89.5  PLT 547* 567* 627* 651* 675* 696*   Cardiac Enzymes: No results for input(s): CKTOTAL, CKMB, CKMBINDEX, TROPONINI in the last 168 hours. BNP (last 3 results)  Recent Labs  02/01/15 1530  BNP 487.5*    ProBNP (last 3 results) No results for input(s): PROBNP  in the last 8760 hours.  CBG:  Recent Labs Lab 02/09/15 2335 02/10/15 0506 02/10/15 0845 02/10/15 1303 02/10/15 1645  GLUCAP 179* 110* 154* 244* 218*    Recent Results (from the past 240 hour(s))  MRSA PCR Screening     Status: None   Collection Time: 02/01/15 12:32 AM  Result Value Ref Range Status   MRSA by PCR NEGATIVE NEGATIVE Final    Comment:        The GeneXpert MRSA Assay (FDA approved for NASAL specimens only), is one component of a comprehensive MRSA colonization surveillance program. It is not intended to diagnose MRSA infection nor to guide or monitor treatment for MRSA infections.   Culture, blood (routine x 2)     Status: None   Collection Time: 02/03/15  1:00 PM  Result Value Ref Range Status   Specimen Description BLOOD LEFT ANTECUBITAL  Final   Special Requests IN PEDIATRIC BOTTLE [place]  Final   Culture   Final    NO GROWTH 5 DAYS Performed at [facility]    Report Status 02/08/2015 FINAL  Final  Culture, Urine     Status: None   Collection Time: 02/03/15  1:00 PM  Result Value Ref Range Status  Specimen Description URINE, CATHETERIZED  Final   Special Requests NONE  Final   Culture   Final    NO GROWTH 2 DAYS Performed at [facility]    Report Status 02/05/2015 FINAL  Final  C difficile quick scan w PCR reflex     Status: None   Collection Time: 02/06/15 10:41 PM  Result Value Ref Range Status   C Diff antigen NEGATIVE NEGATIVE Final   C Diff toxin NEGATIVE NEGATIVE Final   C Diff interpretation Negative for toxigenic C. difficile  Final     Studies: Dg Chest 2 View  02/09/2015  CLINICAL DATA:  Short of breath EXAM: CHEST  2 VIEW COMPARISON:  02/08/2015 FINDINGS: Mild cardiomegaly. Small bilateral effusions are not significantly changed. Diffuse interstitial lung disease is improved. No pneumothorax. Stable right PICC. IMPRESSION: Stable small pleural effusions. Diffuse interstitial disease is improved likely due to  improving edema. Electronically Signed   By: Marybelle Killings M.D.   On: 02/09/2015 11:51    Scheduled Meds: . antiseptic oral rinse  7 mL Mouth Rinse QID  . chlorhexidine gluconate  15 mL Mouth Rinse BID  . clonazepam  1 mg Oral BID  . diltiazem  120 mg Oral Daily  . famotidine  20 mg Oral BID AC  . feeding supplement  1 Container Oral TID BM  . furosemide  60 mg Intravenous BID  . heparin subcutaneous  5,000 Units Subcutaneous 3 times per day  . imipenem-cilastatin  500 mg Intravenous 4 times per day  . insulin aspart  0-9 Units Subcutaneous 6 times per day  . ipratropium-albuterol  3 mL Nebulization TID  . saccharomyces boulardii  250 mg Oral BID  . sodium chloride  10-40 mL Intracatheter Q12H   Continuous Infusions: . sodium chloride 10 mL/hr at 02/01/15 0940  . dextrose Stopped (02/02/15 0830)    Principal Problem:   Ruptured appendix Active Problems:   Perforated appendicitis   Hypoglycemia   Acute respiratory failure with hypoxemia   Sepsis ([place])   Acute respiratory failure with hypoxemia ([place])   Atrial fibrillation ([place])    Time spent: 25 min    Kelvin Cellar  [facility] Pager [phone]. If 7PM-7AM, please contact night-coverage at [email or web address], password [facility] 02/10/2015, 5:36 PM  LOS: 10 days

## 2015-02-11 DIAGNOSIS — I5031 Acute diastolic (congestive) heart failure: Secondary | ICD-10-CM | POA: Diagnosis not present

## 2015-02-11 LAB — BASIC METABOLIC PANEL
ANION GAP: 7 (ref 5–15)
BUN: 12 mg/dL (ref 6–20)
CHLORIDE: 103 mmol/L (ref 101–111)
CO2: 32 mmol/L (ref 22–32)
Calcium: 8.4 mg/dL — ABNORMAL LOW (ref 8.9–10.3)
Creatinine, Ser: 0.76 mg/dL (ref 0.44–1.00)
GFR calc Af Amer: >60 mL/min (ref >60)
GFR calc non Af Amer: >60 mL/min (ref >60)
GLUCOSE: 156 mg/dL — AB (ref 65–99)
POTASSIUM: 3.7 mmol/L (ref 3.5–5.1)
Sodium: 142 mmol/L (ref 135–145)

## 2015-02-11 LAB — CBC
HEMATOCRIT: 33.1 % — AB (ref 36.0–46.0)
HEMOGLOBIN: 10.5 g/dL — AB (ref 12.0–15.0)
MCH: 28.5 pg (ref 26.0–34.0)
MCHC: 31.7 g/dL (ref 30.0–36.0)
MCV: 89.9 fL (ref 78.0–100.0)
Platelets: 729 10*3/uL — ABNORMAL HIGH (ref 150–400)
RBC: 3.68 MIL/uL — ABNORMAL LOW (ref 3.87–5.11)
RDW: 15.8 % — ABNORMAL HIGH (ref 11.5–15.5)
WBC: 16.5 10*3/uL — ABNORMAL HIGH (ref 4.0–10.5)

## 2015-02-11 LAB — GLUCOSE, CAPILLARY
GLUCOSE-CAPILLARY: 183 mg/dL — AB (ref 65–99)
Glucose-Capillary: 128 mg/dL — ABNORMAL HIGH (ref 65–99)
Glucose-Capillary: 136 mg/dL — ABNORMAL HIGH (ref 65–99)
Glucose-Capillary: 184 mg/dL — ABNORMAL HIGH (ref 65–99)
Glucose-Capillary: 221 mg/dL — ABNORMAL HIGH (ref 65–99)

## 2015-02-11 MED ORDER — INSULIN ASPART 100 UNIT/ML ~~LOC~~ SOLN
0.0000 [IU] | Freq: Three times a day (TID) | SUBCUTANEOUS | Status: DC
  Administered 2015-02-12: 5 [IU] via SUBCUTANEOUS
  Administered 2015-02-12 (×2): 3 [IU] via SUBCUTANEOUS
  Administered 2015-02-13 (×3): 5 [IU] via SUBCUTANEOUS
  Administered 2015-02-14 (×3): 3 [IU] via SUBCUTANEOUS
  Administered 2015-02-15: 5 [IU] via SUBCUTANEOUS
  Administered 2015-02-15: 3 [IU] via SUBCUTANEOUS
  Administered 2015-02-16 (×2): 5 [IU] via SUBCUTANEOUS

## 2015-02-11 MED ORDER — CLONAZEPAM 0.125 MG PO TBDP: 1.0000 mg | ORAL_TABLET | Freq: Two times a day (BID) | ORAL | Status: DC | PRN

## 2015-02-11 MED ORDER — FUROSEMIDE 40 MG PO TABS
40.0000 mg | ORAL_TABLET | Freq: Two times a day (BID) | ORAL | Status: DC
  Administered 2015-02-11 – 2015-02-15 (×9): 40 mg via ORAL
  Filled 2015-02-11 (×9): qty 1

## 2015-02-11 MED ORDER — INSULIN ASPART 100 UNIT/ML ~~LOC~~ SOLN
0.0000 [IU] | Freq: Every day | SUBCUTANEOUS | Status: DC
  Administered 2015-02-11 – 2015-02-15 (×2): 2 [IU] via SUBCUTANEOUS

## 2015-02-11 NOTE — Progress Notes (Signed)
Pt was encouraged to go for a walk, but is refusing to do so. Pt states she will go for a walk later [place]

## 2015-02-11 NOTE — Progress Notes (Signed)
Patient refuses to work in [employer], stated she want to get good rest first.

## 2015-02-11 NOTE — Progress Notes (Signed)
Received from ICU AAOX3. O2 at 2 L , JP intact. FROM , voice no complaints, calling light within reach

## 2015-02-11 NOTE — Progress Notes (Signed)
Patient ID: Mercedes Thornton, female   DOB: [birth year], 69 y.o.   MRN: [id number] 11 Days Post-Op  Subjective: Abdomen "not bothering her." tolerating diet.  No n/v.  Objective: Vital signs in last 24 hours: Temp:  [97.8 F (36.6 C)-98.9 F (37.2 C)] 98.7 F (37.1 C) (10/30 0400) Resp:  [19-35] 25 (10/30 0600) BP: (97-133)/(38-104) 118/43 mmHg (10/30 0600) SpO2:  [81 %-98 %] 95 % (10/30 0717) Weight:  [76.4 kg (168 lb 6.9 oz)] 76.4 kg (168 lb 6.9 oz) (10/30 0345) Last BM Date: 02/09/15  Intake/Output from previous day: 10/29 0701 - 10/30 0700 In: 260 [I.V.:160; IV Piggyback:100] Out: 2425 [Urine:2425] Intake/Output this shift:    General appearance: no distress Resp: slightly labored, but just got out of bed to chair.   Cardio: regular rate and rhythm GI: soft, non tender, non distended.   Lab Results:   Recent Labs  02/10/15 0515 02/11/15 0400  WBC 15.2* 16.5*  HGB 10.6* 10.5*  HCT 33.1* 33.1*  PLT 696* 729*   BMET  Recent Labs  02/10/15 0515 02/11/15 0400  NA 144 142  K 4.0 3.7  CL 104 103  CO2 33* 32  GLUCOSE 136* 156*  BUN 10 12  CREATININE 0.68 0.76  CALCIUM 8.3* 8.4*   PT/INR No results for input(s): LABPROT, INR in the last 72 hours. ABG No results for input(s): PHART, HCO3 in the last 72 hours.  Invalid input(s): PCO2, PO2  Studies/Results: Dg Chest 2 View  02/09/2015  CLINICAL DATA:  Short of breath EXAM: CHEST  2 VIEW COMPARISON:  02/08/2015 FINDINGS: Mild cardiomegaly. Small bilateral effusions are not significantly changed. Diffuse interstitial lung disease is improved. No pneumothorax. Stable right PICC. IMPRESSION: Stable small pleural effusions. Diffuse interstitial disease is improved likely due to improving edema. Electronically Signed   By: Marybelle Killings M.D.   On: 02/09/2015 11:51    Anti-infectives: Anti-infectives    Start     Dose/Rate Route Frequency Ordered Stop   02/02/15 1000  vancomycin (VANCOCIN) IVPB 1000 mg/200 mL premix   Status:  Discontinued     1,000 mg 200 mL/hr over 60 Minutes Intravenous Every 12 hours 02/02/15 0947 02/02/15 1049   02/01/15 2000  fluconazole (DIFLUCAN) IVPB 400 mg  Status:  Discontinued     400 mg 100 mL/hr over 120 Minutes Intravenous Every 24 hours 01/31/15 2033 02/04/15 1442   02/01/15 1200  imipenem-cilastatin (PRIMAXIN) 500 mg in sodium chloride 0.9 % 100 mL IVPB     500 mg 200 mL/hr over 30 Minutes Intravenous 4 times per day 02/01/15 1143     02/01/15 1000  vancomycin (VANCOCIN) 1,250 mg in sodium chloride 0.9 % 250 mL IVPB  Status:  Discontinued     1,250 mg 166.7 mL/hr over 90 Minutes Intravenous Every 24 hours 01/31/15 2033 02/02/15 0947   02/01/15 0600  ciprofloxacin (CIPRO) IVPB 400 mg  Status:  Discontinued     400 mg 200 mL/hr over 60 Minutes Intravenous Every 12 hours 01/31/15 1952 01/31/15 2019   01/31/15 2300  vancomycin (VANCOCIN) 1,250 mg in sodium chloride 0.9 % 250 mL IVPB     1,250 mg 166.7 mL/hr over 90 Minutes Intravenous  Once 01/31/15 2033 01/31/15 2353   01/31/15 2300  fluconazole (DIFLUCAN) IVPB 800 mg     800 mg 200 mL/hr over 120 Minutes Intravenous  Once 01/31/15 2033 02/01/15 0024   01/31/15 2200  ciprofloxacin (CIPRO) IVPB 400 mg  Status:  Discontinued     400  mg 200 mL/hr over 60 Minutes Intravenous Every 12 hours 01/31/15 1559 01/31/15 1952   01/31/15 2200  metroNIDAZOLE (FLAGYL) IVPB 500 mg  Status:  Discontinued     500 mg 100 mL/hr over 60 Minutes Intravenous Every 8 hours 01/31/15 1952 01/31/15 2019   01/31/15 2200  imipenem-cilastatin (PRIMAXIN) 500 mg in sodium chloride 0.9 % 100 mL IVPB  Status:  Discontinued     500 mg 200 mL/hr over 30 Minutes Intravenous 3 times per day 01/31/15 2033 02/01/15 1143   01/31/15 2000  metroNIDAZOLE (FLAGYL) IVPB 500 mg  Status:  Discontinued     500 mg 100 mL/hr over 60 Minutes Intravenous 3 times per day 01/31/15 1559 01/31/15 1952   01/31/15 1800  metroNIDAZOLE (FLAGYL) IVPB 500 mg  Status:   Discontinued     500 mg 100 mL/hr over 60 Minutes Intravenous 4 times per day 01/31/15 1543 01/31/15 1559   01/31/15 1215  metroNIDAZOLE (FLAGYL) IVPB 500 mg     500 mg 100 mL/hr over 60 Minutes Intravenous  Once 01/31/15 1201 01/31/15 1350   01/31/15 1100  cefTRIAXone (ROCEPHIN) 1 g in dextrose 5 % 50 mL IVPB     1 g 100 mL/hr over 30 Minutes Intravenous  Once 01/31/15 1051 01/31/15 1230      Assessment/Plan: POD 10 lap appy   Would continue abx and regular diet Follow wbc and exam  Would not repeat scan today since pt feeling well.  May still need to repeat later.    [name] 02/11/2015

## 2015-02-11 NOTE — Progress Notes (Addendum)
[facility] PROGRESS NOTE  Barbaraann Avans ZOX:[id number] DOB: 1945-07-02 DOA: 01/31/2015 PCP: Antonietta Jewel, MD    Interim Summary  Mercedes Thornton is a 69 year old female with a past medical history of chronic objective pulmonary disease, diabetes mellitus, dyslipidemia, admitted on 01/31/2015 presenting with nausea/vomiting/abdominal pain with CT scan of abdomen and pelvis revealing a ruptured appendicitis with developing pelvic abscess. She was taken to the operating room on 01/31/2015 where she underwent laparoscopic appendectomy and evacuation of pelvic abscesses. Procedure was performed by Dr.Newman of general surgery. Postoperatively she went into acute hypoxemic respiratory failure and required ventilatory support. She had sepsis with hypotension requiring IV pressor support. She was treated with broad-spectrum IV and microbial therapy with Primaxin, ciprofloxacin and IV vancomycin. She showed gradual clinical improvement and was extubated on 02/04/2015.                            Assessment/Plan: 1. Acute hypoxemic respiratory failure. -Postoperatively patient requiring ventilator support. Extubated on 02/04/2015.  -Could be secondary to volume overload with imaging studies revealing acute pulmonary edema. Patient also having a history of structural lung disease in setting of chronic obstructive lung disease. -Repeat chest x-ray performed on 02/08/2015 showing diffuse interstitial prominence which could reflect combination of COPD and edema. Atelectasis. -On 02/09/2015 her oxygen requirement increased for which I increased her lasix to 60 mg IV BID, having a positive fluid balance on ins/outs and increasing crackles on exam.  -She subsequently had good diuresis. Follow up CXR showed improvement to edema.  -Working on weaning her supplemental oxygen. Encouraged her to use incentive spirometer and out of bed to chair.   2.  Ruptured appendicitis -Patient undergoing laparoscopic appendectomy  with evacuation of pelvic abscess on 01/31/2015, procedure performed by Dr. Lucia Gaskins of general surgery. -Postoperative course, complicated by sepsis, she remains on antibiotic therapy with Primaxin 500 mg IV every 6 hours. -Surgery following.  -Her WBC's remain elevated at 16,500 on 02/11/2015  -Plan to re-image her next week per surgery.  3.  Sepsis -Present on admission, source of infection likely ruptured appendix. Sepsis evidenced by the presence of shock, requiring IV pressor support, acute respiratory failure requiring ventilator support. -Showing clinical improvement. -Blood cultures obtained on 01/31/2015 at 02/03/2015 showing no growth -Remains on Primaxin.  -Plan for repeat CT scan of abd/pelvis as recommended by surgery  4.  Pulmonary edema -Likely contributing to rotatory failure -Transthoracic echocardiogram performed on 02/02/2015 showing preserved systolic function  -I increased her Lasix to 60 mg IV twice a day after which she had good diuresis.  5. Acute Diatolic CHF -TTE performed on 02/02/2015 showing preserved EF  - Ins/outs showing now a net negative fluid balance of -1.2L, diuresing 2.4 L in the last 24 hours.  -Will transition to Lasix 40 mg PO BID.   6.  Pericardial effusion. -Patient having a moderate pericardial effusion seen on transthoracic echocardiogram from 02/02/2015. Echo did not reveal evidence of hemodynamic compromise -Repeat transthoracic echocardiogram performed on 02/09/2015 showing small to moderate pleural effusion without evidence of hemodynamic compromise.  6.  A. Fib -Patient's hospital course, complicated by A. fib RVR requiring amiodarone drip -She converted to sinus rhythm, will discontinue amiodarone drip, started Cardizem 120 mg by mouth daily as she has converted to sinus rhythm  7.  Hypokalemia -Potassium 3.7 on 02/11/2015 after receiving replacement.  -Hypokalemia likely secondary to IV diuresis.   8.  Hypomagnesemia -Replaced  Code Status: Full code Family Communication: I  spoke to her daughters at bedside Disposition Plan: Will transfer out of SDU to telemetry   Consultants:  Pulmonary critical care medicine  General surgery  Procedures:  Laparoscopic appendectomy procedure performed on 01/31/2015  Antibiotics:  Permaxin  HPI/Subjective:  She thinks she is getting better, breathing easier.   Objective: Filed Vitals:   02/11/15 0800  BP: 112/58  Pulse:   Temp: 97.9 F (36.6 C)  Resp: 31    Intake/Output Summary (Last 24 hours) at 02/11/15 1017 Last data filed at 02/11/15 0800  Gross per 24 hour  Intake    440 ml  Output   2535 ml  Net  -2095 ml   Filed Weights   02/08/15 1100 02/09/15 0600 02/11/15 0345  Weight: 76.4 kg (168 lb 6.9 oz) 78.1 kg (172 lb 2.9 oz) 76.4 kg (168 lb 6.9 oz)    Exam:   General:  Patient having no complaints, she was assisted out of bed to chair today. Did well.  Cardiovascular: Regular rate rhythm normal S1-S2  Respiratory: Improved lung sounds, decreased bibasilar rales and crackles  Abdomen: Surgical incision sites appears clean, abd soft nontender nondistended.   Musculoskeletal: trace edema to lower extremities.   Data Reviewed: Basic Metabolic Panel:  Recent Labs Lab 02/05/15 0431 02/05/15 1054  02/05/15 1615 02/06/15 0500 02/07/15 0442 02/08/15 0430 02/09/15 0600 02/10/15 0515 02/11/15 0400  NA 147* 147*  < > 146* 150* 150* 145 146* 144 142  K 2.4* 4.7  < > 2.7* 3.1* 2.9* 2.9* 3.3* 4.0 3.7  CL 103 108  < > 100* 104 104 100* 103 104 103  CO2 31 29  < > 31 33* 35* 35* 35* 33* 32  GLUCOSE 149* 180*  < > 198* 148* 163* 153* 153* 136* 156*  BUN 12 10  < > 11 9 7 6 7 10 12   CREATININE 0.72 0.68  < > 0.77 0.68 0.68 0.68 0.76 0.68 0.76  CALCIUM 7.6* 6.6*  < > 7.6* 7.8* 8.0* 8.0* 8.1* 8.3* 8.4*  MG 1.8 1.2*  --  1.3* 2.1 1.4* 1.5* 1.5*  --   --   PHOS 3.1 2.3*  --   --  2.6 3.3 3.6  --   --   --   < > =  values in this interval not displayed. Liver Function Tests:  Recent Labs Lab 02/09/15 0600  AST 19  ALT 12*  ALKPHOS 69  BILITOT 0.4  PROT 6.5  ALBUMIN 2.1*   No results for input(s): LIPASE, AMYLASE in the last 168 hours. No results for input(s): AMMONIA in the last 168 hours. CBC:  Recent Labs Lab 02/05/15 1054  02/07/15 0442 02/08/15 0430 02/09/15 0600 02/10/15 0515 02/11/15 0400  WBC 28.9*  < > 17.6* 18.0* 15.7* 15.2* 16.5*  NEUTROABS 25.2*  --   --   --   --   --   --   HGB 8.9*  < > 10.4* 10.5* 10.3* 10.6* 10.5*  HCT 27.0*  < > 32.5* 32.5* 32.5* 33.1* 33.1*  MCV 87.1  < > 88.6 88.3 89.5 89.5 89.9  PLT 547*  < > 627* 651* 675* 696* 729*  < > = values in this interval not displayed. Cardiac Enzymes: No results for input(s): CKTOTAL, CKMB, CKMBINDEX, TROPONINI in the last 168 hours. BNP (last 3 results)  Recent Labs  02/01/15 1530  BNP 487.5*    ProBNP (last 3 results) No results for input(s): PROBNP in the last 8760 hours.  CBG:  Recent Labs Lab 02/10/15 1645 02/10/15 2018 02/10/15 2349 02/11/15 0325 02/11/15 0749  GLUCAP 218* 234* 136* 184* 128*    Recent Results (from the past 240 hour(s))  Culture, blood (routine x 2)     Status: None   Collection Time: 02/03/15  1:00 PM  Result Value Ref Range Status   Specimen Description BLOOD LEFT ANTECUBITAL  Final   Special Requests IN PEDIATRIC BOTTLE [place]  Final   Culture   Final    NO GROWTH 5 DAYS Performed at [facility]    Report Status 02/08/2015 FINAL  Final  Culture, Urine     Status: None   Collection Time: 02/03/15  1:00 PM  Result Value Ref Range Status   Specimen Description URINE, CATHETERIZED  Final   Special Requests NONE  Final   Culture   Final    NO GROWTH 2 DAYS Performed at [facility]    Report Status 02/05/2015 FINAL  Final  C difficile quick scan w PCR reflex     Status: None   Collection Time: 02/06/15 10:41 PM  Result Value Ref Range Status   C Diff  antigen NEGATIVE NEGATIVE Final   C Diff toxin NEGATIVE NEGATIVE Final   C Diff interpretation Negative for toxigenic C. difficile  Final     Studies: Dg Chest 2 View  02/09/2015  CLINICAL DATA:  Short of breath EXAM: CHEST  2 VIEW COMPARISON:  02/08/2015 FINDINGS: Mild cardiomegaly. Small bilateral effusions are not significantly changed. Diffuse interstitial lung disease is improved. No pneumothorax. Stable right PICC. IMPRESSION: Stable small pleural effusions. Diffuse interstitial disease is improved likely due to improving edema. Electronically Signed   By: Marybelle Killings M.D.   On: 02/09/2015 11:51    Scheduled Meds: . antiseptic oral rinse  7 mL Mouth Rinse QID  . diltiazem  120 mg Oral Daily  . famotidine  20 mg Oral BID AC  . feeding supplement  1 Container Oral TID BM  . furosemide  40 mg Oral BID  . heparin subcutaneous  5,000 Units Subcutaneous 3 times per day  . imipenem-cilastatin  500 mg Intravenous 4 times per day  . insulin aspart  0-9 Units Subcutaneous 6 times per day  . ipratropium-albuterol  3 mL Nebulization TID  . saccharomyces boulardii  250 mg Oral BID  . sodium chloride  10-40 mL Intracatheter Q12H   Continuous Infusions: . sodium chloride 10 mL/hr at 02/01/15 0940    Principal Problem:   Ruptured appendix Active Problems:   Perforated appendicitis   Hypoglycemia   Acute respiratory failure with hypoxemia   Sepsis ([place])   Acute respiratory failure with hypoxemia (HCC)   Atrial fibrillation ([place])   Acute diastolic heart failure ([place])    Time spent: 30 min    Kelvin Cellar  [facility] Pager [phone]. If 7PM-7AM, please contact night-coverage at [email or web address], password [facility] 02/11/2015, 10:17 AM  LOS: 11 days

## 2015-02-12 DIAGNOSIS — I472 Ventricular tachycardia: Secondary | ICD-10-CM

## 2015-02-12 DIAGNOSIS — I480 Paroxysmal atrial fibrillation: Secondary | ICD-10-CM

## 2015-02-12 DIAGNOSIS — I5031 Acute diastolic (congestive) heart failure: Secondary | ICD-10-CM

## 2015-02-12 LAB — GLUCOSE, CAPILLARY
GLUCOSE-CAPILLARY: 208 mg/dL — AB (ref 65–99)
GLUCOSE-CAPILLARY: 160 mg/dL — AB (ref 65–99)
Glucose-Capillary: 193 mg/dL — ABNORMAL HIGH (ref 65–99)
Glucose-Capillary: 188 mg/dL — ABNORMAL HIGH (ref 65–99)

## 2015-02-12 LAB — CBC
HEMATOCRIT: 33.7 % — AB (ref 36.0–46.0)
HEMOGLOBIN: 10.8 g/dL — AB (ref 12.0–15.0)
MCH: 28.5 pg (ref 26.0–34.0)
MCHC: 32 g/dL (ref 30.0–36.0)
MCV: 88.9 fL (ref 78.0–100.0)
Platelets: 686 10*3/uL — ABNORMAL HIGH (ref 150–400)
RBC: 3.79 MIL/uL — AB (ref 3.87–5.11)
RDW: 15.5 % (ref 11.5–15.5)
WBC: 12.6 10*3/uL — ABNORMAL HIGH (ref 4.0–10.5)

## 2015-02-12 LAB — BASIC METABOLIC PANEL
ANION GAP: 7 (ref 5–15)
BUN: 13 mg/dL (ref 6–20)
CALCIUM: 8.6 mg/dL — AB (ref 8.9–10.3)
CO2: 31 mmol/L (ref 22–32)
Chloride: 102 mmol/L (ref 101–111)
Creatinine, Ser: 0.7 mg/dL (ref 0.44–1.00)
Glucose, Bld: 217 mg/dL — ABNORMAL HIGH (ref 65–99)
POTASSIUM: 3.3 mmol/L — AB (ref 3.5–5.1)
Sodium: 140 mmol/L (ref 135–145)

## 2015-02-12 LAB — MAGNESIUM: Magnesium: 1.8 mg/dL (ref 1.7–2.4)

## 2015-02-12 MED ORDER — POTASSIUM CHLORIDE CRYS ER 20 MEQ PO TBCR
40.0000 meq | EXTENDED_RELEASE_TABLET | Freq: Once | ORAL | Status: AC
  Administered 2015-02-12: 40 meq via ORAL
  Filled 2015-02-12: qty 2

## 2015-02-12 MED ORDER — MAGNESIUM SULFATE 2 GM/50ML IV SOLN
2.0000 g | Freq: Once | INTRAVENOUS | Status: AC
  Administered 2015-02-12: 2 g via INTRAVENOUS
  Filled 2015-02-12: qty 50

## 2015-02-12 NOTE — Progress Notes (Signed)
PT Cancellation Note  Patient Details Name: Mercedes Thornton MRN: [id number] DOB: 1945/10/20   Cancelled Treatment:    Reason Eval/Treat Not Completed: Pt just finished walking with nursing. Encouraged pt to continue as tolerated. Will check back another day.    Weston Anna, MPT Pager: [phone]

## 2015-02-12 NOTE — Progress Notes (Signed)
Ambulated with patient in the hall.  Patient o2 sat dropped to 88 on room air.  Placed patient back on 2L  and o2 sat went up to 93%.  Patient walked 150 ft, tolerated well.

## 2015-02-12 NOTE — Consult Note (Addendum)
Admit date: 01/31/2015 Referring Physician  Dr. Erlinda Hong  Primary Physician  Dr. Antonietta Jewel Primary Cardiologist  None Reason for Consultation  Wide complex tachycardia  HPI: Mercedes Thornton is a 69 year old female with a past medical history of chronic objective pulmonary disease, diabetes mellitus and dyslipidemia but no cardiac history who was admitted on 01/31/2015 presenting with nausea/vomiting/abdominal pain with CT scan of abdomen and pelvis revealed a ruptured appendicitis with developing pelvic abscess. She was taken to the operating room on 01/31/2015 where she underwent laparoscopic appendectomy and evacuation of pelvic abscesses. Procedure was performed by Dr.Newman of general surgery. Postoperatively she went into acute hypoxemic respiratory failure and required ventilatory support. She had sepsis with hypotension requiring IV pressor support. She was treated with broad-spectrum IV and microbial therapy with Primaxin, ciprofloxacin and IV vancomycin. She showed gradual clinical improvement and was extubated on 02/04/2015.She has some problems with postop atrial fibrillation which was treated with IV Amio.  This has subsequently been stopped and she is now on Cardizem.  2D echo showed normal LVF with mild to moderate pericardial effusion with no tamponade.  Today she had a 7 beat run of wide complex tachycardia and Cardiology is now asked to consult.  She denies any history of exertional CP or DOE.  She has no family history of CAD but is an ongoing smoker.       PMH:   Past Medical History  Diagnosis Date  . Leukocytosis   . COPD (chronic obstructive pulmonary disease) ([place])   . Allergic rhinitis   . DM (diabetes mellitus) ([place])   . Bipolar affective disorder ([place])   . Hyperlipidemia   . Smoking   . Cough 10/30/2011  . SOB (shortness of breath) 10/30/2011  . Mediastinal adenopathy 11/26/2011     PSH:   Past Surgical History  Procedure Laterality Date  .  Cataract extraction    . Cesarean section    . Colonoscopy  2008    negative per pt's report, with [facility]   . Laparoscopic appendectomy N/A 01/31/2015    Procedure: LYSIS OF ADHESIONS /APPENDECTOMY LAPAROSCOPIC ;  Surgeon: Alphonsa Overall, MD;  Location: [facility];  Service: General;  Laterality: N/A;    Allergies:  Penicillins Prior to Admit Meds:   Prescriptions prior to admission  Medication Sig Dispense Refill Last Dose  . albuterol (PROVENTIL HFA;VENTOLIN HFA) 108 (90 BASE) MCG/ACT inhaler Inhale 2 puffs into the lungs every 6 (six) hours as needed for wheezing.   unknown  . aspirin 81 MG tablet Take 81 mg by mouth daily.   01/30/2015 at Unknown time  . cetirizine (ZYRTEC) 10 MG tablet Take 10 mg by mouth daily.   01/31/2015 at Unknown time  . Cholecalciferol (VITAMIN D3) 3000 UNITS TABS Take 1 tablet by mouth daily.    01/31/2015 at Unknown time  . Cinnamon 500 MG TABS Take 1 tablet by mouth daily.    01/31/2015 at Unknown time  . citalopram (CELEXA) 20 MG tablet Take 1 tablet by mouth daily.   01/26/2015 at Unknown time  . clonazePAM (KLONOPIN) 1 MG tablet Take 1 tablet by mouth daily.   01/31/2015 at Unknown time  . Cod Liver Oil CAPS Take 1 capsule by mouth daily.    01/31/2015 at Unknown time  . fluticasone (FLONASE) 50 MCG/ACT nasal spray Place 1 spray into both nostrils daily as needed for allergies.    Past Week at Unknown time  . glimepiride (AMARYL) 4 MG tablet Take 1 tablet  by mouth daily.   01/31/2015 at Unknown time  . lisinopril (PRINIVIL,ZESTRIL) 5 MG tablet Take 5 mg by mouth daily.    01/31/2015 at Unknown time  . lovastatin (MEVACOR) 20 MG tablet Take 20 mg by mouth at bedtime.    01/30/2015 at Unknown time  . metFORMIN (GLUCOPHAGE-XR) 500 MG 24 hr tablet Take 1 tablet by mouth 2 (two) times daily.   01/31/2015 at Unknown time  . Multiple Vitamins-Minerals (WOMENS MULTIVITAMIN PLUS PO) Take 1 tablet by mouth daily.    01/31/2015 at Unknown time  . UNABLE TO FIND Take 1  tablet by mouth daily. Med Name:  Tumeric   01/30/2015 at Unknown time  . vitamin C (ASCORBIC ACID) 500 MG tablet Take 500 mg by mouth daily.   01/31/2015 at Unknown time   Fam HX:    Family History  Problem Relation Age of Onset  . Stroke Mother   . Cancer Father     prostate   Social HX:    Social History   Social History  . Marital Status: Divorced    Spouse Name: N/A  . Number of Children: 2  . Years of Education: N/A   Occupational History  .      retired grade school/middle school teacher   Social History Main Topics  . Smoking status: Current Every Day Smoker -- 0.25 packs/day for 40 years  . Smokeless tobacco: Never Used  . Alcohol Use: No  . Drug Use: No  . Sexual Activity: Not on file   Other Topics Concern  . Not on file   Social History Narrative     ROS:  All 11 ROS were addressed and are negative except what is stated in the HPI  Physical Exam: Blood pressure 112/31, pulse 76, temperature 98.4 F (36.9 C), temperature source Oral, resp. rate 18, height 5\' 10"  (1.778 m), weight 170 lb 13.7 oz (77.5 kg), SpO2 93 %.    General: Well developed, well nourished, in no acute distress Head: Eyes PERRLA, No xanthomas.   Normal cephalic and atramatic  Lungs:   Clear bilaterally to auscultation and percussion. Heart:   HRRR S1 S2 Pulses are 2+ & equal.            No carotid bruit. No JVD.  No abdominal bruits. No femoral bruits. Abdomen: Bowel sounds are positive, abdomen soft and non-tender without masses  Extremities:   No clubbing, cyanosis or edema.  DP +1 Neuro: Alert and oriented X 3. Psych:  Good affect, responds appropriately    Labs:   Lab Results  Component Value Date   WBC 12.6* 02/12/2015   HGB 10.8* 02/12/2015   HCT 33.7* 02/12/2015   MCV 88.9 02/12/2015   PLT 686* 02/12/2015    Recent Labs Lab 02/09/15 0600  02/12/15 0700  NA 146*  < > 140  K 3.3*  < > 3.3*  CL 103  < > 102  CO2 35*  < > 31  BUN 7  < > 13  CREATININE 0.76  < >  0.70  CALCIUM 8.1*  < > 8.6*  PROT 6.5  --   --   BILITOT 0.4  --   --   ALKPHOS 69  --   --   ALT 12*  --   --   AST 19  --   --   GLUCOSE 153*  < > 217*  < > = values in this interval not displayed. No results found for: PTT Lab  Results  Component Value Date   INR 0.92 11/11/2011   Lab Results  Component Value Date   TROPONINI 0.08* 02/02/2015    No results found for: CHOL No results found for: HDL No results found for: LDLCALC No results found for: TRIG No results found for: CHOLHDL No results found for: LDLDIRECT    Radiology:  No results found.  EKG:  10/20 showed atrial fibrillation with RVR - No EKG done since then  ASSESSMENT/PLAN: 1.  Wide complex tachycardia 7 beats in the setting of a potassium of 3.3.  Her magnesium was also low a few days ago at 1.5. Admit EKG showed NSR with anterior infarct and nonspecific T wave abnormality with normal QTc.   Echo showed normal LVF.  WCT is most likely secondary to hypokalemia.  Need to replete potassium and keep > 4.  Check a Mag level and replete if low.  She has not had any symptoms of angina but does have risk factors including dyslipidemia, DM and ongoing tobacco use.  Plans have been made for her to go to [place] to recover from her Appendicitis.  I think we should proceed with Leane Call prior to discharge to rule out ischemia.  Would not add BB at this time due to soft BP. Continue low dose CCB. 2.  Post op afib with no further reoccurrence.  She has not had any history of PAF in the past and this was in the setting of acute illness.  Would not recommend long term anticoagulation at this time unless she has further episodes.  This patients CHA2DS2-VASc Score and unadjusted Ischemic Stroke Rate (% per year) is equal to 3.2 % stroke rate/year from a score of 3. 3.  Acute ruptured appendix with forming pelvic abcess s/p laparoscopic appendectomy - per surgery 4.  Acute diastolic CHF secondary to acute illness - appears  euvolemic on exam after IV lasix.  Now on PO lasix.   5.  Mild to moderate pericardial effusion ? Etiology - repeat echo in 2 weeks to ensure this is resolving. 6.  Hypokalemia/Hypomagnesemia - replete per IM    Sueanne Margarita, MD  02/12/2015  1:43 PM

## 2015-02-12 NOTE — Progress Notes (Signed)
ANTIBIOTIC CONSULT NOTE - Follow-up  Pharmacy Consult for imipenem Indication: Ruptured appendix with pelvic abscesses  Allergies  Allergen Reactions  . Penicillins Hives and Swelling    Has patient had a PCN reaction causing immediate rash, facial/tongue/throat swelling, SOB or lightheadedness with hypotension: Yes Has patient had a PCN reaction causing severe rash involving mucus membranes or skin necrosis: Yes Has patient had a PCN reaction that required hospitalization Yes- hospital for 7 days Has patient had a PCN reaction occurring within the last 10 years: No If all of the above answers are "NO", then may proceed with Cephalosporin use.     Patient Measurements: Height: 5\' 10"  (177.8 cm) Weight: 170 lb 13.7 oz (77.5 kg) IBW/kg (Calculated) : 68.5 Adjusted Body Weight:   Vital Signs: Temp: 98.4 F (36.9 C) (10/31 0554) Temp Source: Oral (10/31 0554) BP: 112/31 mmHg (10/31 0554) Pulse Rate: 76 (10/31 0554) Intake/Output from previous day: 10/30 0701 - 10/31 0700 In: 700 [P.O.:360; I.V.:140; IV Piggyback:200] Out: 65 [Urine:60; Drains:5] Intake/Output from this shift: Total I/O In: 240 [P.O.:240] Out: -   Labs:  Recent Labs  02/10/15 0515 02/11/15 0400 02/12/15 0700  WBC 15.2* 16.5* 12.6*  HGB 10.6* 10.5* 10.8*  PLT 696* 729* 686*  CREATININE 0.68 0.76 0.70   Estimated Creatinine Clearance: 72.8 mL/min (by C-G formula based on Cr of 0.7). No results for input(s): VANCOTROUGH, VANCOPEAK, VANCORANDOM, GENTTROUGH, GENTPEAK, GENTRANDOM, TOBRATROUGH, TOBRAPEAK, TOBRARND, AMIKACINPEAK, AMIKACINTROU, AMIKACIN in the last 72 hours.   Microbiology: Recent Results (from the past 720 hour(s))  Blood Culture (routine x 2)     Status: None   Collection Time: 01/31/15 11:50 AM  Result Value Ref Range Status   Specimen Description BLOOD LEFT ANTECUBITAL  Final   Special Requests BOTTLES DRAWN AEROBIC AND ANAEROBIC 5CC  Final   Culture   Final    NO GROWTH 5  DAYS Performed at [facility]    Report Status 02/05/2015 FINAL  Final  Blood Culture (routine x 2)     Status: None   Collection Time: 01/31/15 12:02 PM  Result Value Ref Range Status   Specimen Description BLOOD LEFT HAND  Final   Special Requests IN PEDIATRIC BOTTLE [place]  Final   Culture   Final    NO GROWTH 5 DAYS Performed at [facility]    Report Status 02/05/2015 FINAL  Final  Urine culture     Status: None   Collection Time: 01/31/15  2:00 PM  Result Value Ref Range Status   Specimen Description URINE, CATHETERIZED  Final   Special Requests NONE  Final   Culture   Final    NO GROWTH 1 DAY Performed at [facility]    Report Status 02/01/2015 FINAL  Final  MRSA PCR Screening     Status: None   Collection Time: 02/01/15 12:32 AM  Result Value Ref Range Status   MRSA by PCR NEGATIVE NEGATIVE Final    Comment:        The GeneXpert MRSA Assay (FDA approved for NASAL specimens only), is one component of a comprehensive MRSA colonization surveillance program. It is not intended to diagnose MRSA infection nor to guide or monitor treatment for MRSA infections.   Culture, blood (routine x 2)     Status: None   Collection Time: 02/03/15  1:00 PM  Result Value Ref Range Status   Specimen Description BLOOD LEFT ANTECUBITAL  Final   Special Requests IN PEDIATRIC BOTTLE [place]  Final  Culture   Final    NO GROWTH 5 DAYS Performed at [facility]    Report Status 02/08/2015 FINAL  Final  Culture, Urine     Status: None   Collection Time: 02/03/15  1:00 PM  Result Value Ref Range Status   Specimen Description URINE, CATHETERIZED  Final   Special Requests NONE  Final   Culture   Final    NO GROWTH 2 DAYS Performed at [facility]    Report Status 02/05/2015 FINAL  Final  C difficile quick scan w PCR reflex     Status: None   Collection Time: 02/06/15 10:41 PM  Result Value Ref Range Status   C Diff antigen NEGATIVE NEGATIVE Final    C Diff toxin NEGATIVE NEGATIVE Final   C Diff interpretation Negative for toxigenic C. difficile  Final    Medical History: Past Medical History  Diagnosis Date  . Leukocytosis   . COPD (chronic obstructive pulmonary disease) ([place])   . Allergic rhinitis   . DM (diabetes mellitus) ([place])   . Bipolar affective disorder ([place])   . Hyperlipidemia   . Smoking   . Cough 10/30/2011  . SOB (shortness of breath) 10/30/2011  . Mediastinal adenopathy 11/26/2011   Assessment: 51 yoF presents with abd pain and N/V.  Found to have ruptured appendix and multiple abscesses s/p laparascopic appendectomy with evacuation of pelvic abscess on 10/19.  Post-op course complicated by sepsis. Currently on D10 of primaxin   Ceftriaxone x1 in ED  10/19 >> cipro >> 10/19 10/19 >> metronidazole >> 10/19 10/19 >> primaxin >> 10/19 >> vancomycin >> 10/21 10/19 >> fluconazole >> 10/23  PCT trending down  10/19 urine: NG 10/19 blood: NGTD 10/22 blood: NGTD 10/22 urine (UA neg):   Today, 02/12/2015: Temp: Afebrile WBC: trending down slowly Renal: SCr stable wnl; CrCl 73 CG  Repeat CT shows post appendectomy with scattered inflammatory changes in the pelvis, including bowel wall thickening and enhancement of multiple small bowel loops as well has a loculated fluid collection.  Goal of Therapy:  Dose for indication and for patient-specific parameters  Plan:  Day #13 abx  Continue Primaxin 500mg  IV q6h.   Pharmacy will sign-off as dose adjustment unlikely  F/u renal function, clinical course; LOT/de-escalation  Doreene Eland, PharmD, BCPS.   Pager: [phone] 02/12/2015 1:34 PM

## 2015-02-12 NOTE — Progress Notes (Signed)
Per NT patient had bright red blood in stool. Per patient MD is aware of blood in stool. According to patient this is not the first occurrence since surgery. Will update day nurse.

## 2015-02-12 NOTE — Progress Notes (Signed)
Patient ambulated in hall 168 ft. Patient tolerated well. Will continue to monitor closely

## 2015-02-12 NOTE — Progress Notes (Signed)
[facility] PROGRESS NOTE  Weronika Birch PXT:[id number] DOB: 1945-08-06 DOA: 01/31/2015 PCP: Antonietta Jewel, MD    Interim Summary  [name] is a 69 year old female with a past medical history of chronic objective pulmonary disease, diabetes mellitus, dyslipidemia, admitted on 01/31/2015 presenting with nausea/vomiting/abdominal pain with CT scan of abdomen and pelvis revealing a ruptured appendicitis with developing pelvic abscess. She was taken to the operating room on 01/31/2015 where she underwent laparoscopic appendectomy and evacuation of pelvic abscesses. Procedure was performed by Dr.Newman of general surgery. Postoperatively she went into acute hypoxemic respiratory failure and required ventilatory support. She had sepsis with hypotension requiring IV pressor support. She was treated with broad-spectrum IV and microbial therapy with Primaxin, ciprofloxacin and IV vancomycin. She showed gradual clinical improvement and was extubated on 02/04/2015.                            Assessment/Plan: 1. Acute hypoxemic respiratory failure. -Postoperatively patient requiring ventilator support. Extubated on 02/04/2015.  -Could be secondary to volume overload with imaging studies revealing acute pulmonary edema. Patient also having a history of structural lung disease in setting of chronic obstructive lung disease. -Repeat chest x-ray performed on 02/08/2015 showing diffuse interstitial prominence which could reflect combination of COPD and edema. Atelectasis. -On 02/09/2015 her oxygen requirement increased for which I increased her lasix to 60 mg IV BID, having a positive fluid balance on ins/outs and increasing crackles on exam.  -She subsequently had good diuresis. Follow up CXR showed improvement to edema.  -Working on weaning her supplemental oxygen. Encouraged her to use incentive spirometer and out of bed to chair. -ambulate on room air , check oxygen saturation.  Continue wean oxygen  2.   Ruptured appendicitis -Patient undergoing laparoscopic appendectomy with evacuation of pelvic abscess on 01/31/2015, procedure performed by Dr. Lucia Gaskins of general surgery. -Postoperative course, complicated by sepsis, she remains on antibiotic therapy with Primaxin 500 mg IV every 6 hours. Wbc trending down ( h/o chronic leukocytosis) -Surgery following, surgery to decide when to re-imaging ab/pel and abx duration.   3.  Sepsis -Present on admission, source of infection likely ruptured appendix. Sepsis evidenced by the presence of shock, requiring IV pressor support, acute respiratory failure requiring ventilator support. -Showing clinical improvement. -Blood cultures obtained on 01/31/2015 at 02/03/2015 showing no growth -Remains on Primaxin, eating, d/c ivf.  4. Acute Diatolic CHF/pulmonary edema -TTE performed on 02/02/2015 showing preserved EF  - Ins/outs showing now a net negative fluid balance of -1.2L, diuresing 2.4 L in the last 24 hours.  -now Lasix 40 mg PO BID. No edema, on 2liter oxygen supplement  5.  Pericardial effusion. -Patient having a moderate pericardial effusion seen on transthoracic echocardiogram from 02/02/2015. Echo did not reveal evidence of hemodynamic compromise -Repeat transthoracic echocardiogram performed on 02/09/2015 showing small to moderate pleural effusion without evidence of hemodynamic compromise.  6.  A. Fib (paroxysimal , post op) -Patient's hospital course, complicated by A. fib RVR requiring amiodarone drip -She converted to sinus rhythm, will discontinue amiodarone drip, started Cardizem 120 mg by mouth daily as she has converted to sinus rhythm -cardiology consulted.  7.  Hypokalemia -Potassium 3.7 on 02/11/2015 after receiving replacement.  -Hypokalemia likely secondary to IV diuresis.   8. Hypomagnesemia -Replaced  9. NSVT, keep mag>2, k >4, cardiology consulted.  Code Status: Full code Family Communication: I spoke to her daughters  over the phone Disposition Plan: d/c home with daughter and home health  in 1-2 days if medically stable   Consultants:  Pulmonary critical care medicine  General surgery  Procedures:  Laparoscopic appendectomy procedure performed on 01/31/2015  Antibiotics:  Permaxin  HPI/Subjective:  had brief NSVT on tele, asymptomatic, She thinks she is getting better, breathing easier.   Objective: Filed Vitals:   02/12/15 0554  BP: 112/31  Pulse: 76  Temp: 98.4 F (36.9 C)  Resp: 18    Intake/Output Summary (Last 24 hours) at 02/12/15 1321 Last data filed at 02/12/15 0900  Gross per 24 hour  Intake    920 ml  Output      5 ml  Net    915 ml   Filed Weights   02/09/15 0600 02/11/15 0345 02/12/15 0554  Weight: 172 lb 2.9 oz (78.1 kg) 168 lb 6.9 oz (76.4 kg) 170 lb 13.7 oz (77.5 kg)    Exam:   General:  NAd, talking to daughter over the phone  Cardiovascular: Regular rate rhythm normal S1-S2  Respiratory: Improved lung sounds, decreased bibasilar rales and crackles  Abdomen: Surgical incision sites appears clean, abd soft nontender nondistended.   Musculoskeletal: trace edema to lower extremities.   Data Reviewed: Basic Metabolic Panel:  Recent Labs Lab 02/05/15 1615 02/06/15 0500 02/07/15 0442 02/08/15 0430 02/09/15 0600 02/10/15 0515 02/11/15 0400 02/12/15 0700  NA 146* 150* 150* 145 146* 144 142 140  K 2.7* 3.1* 2.9* 2.9* 3.3* 4.0 3.7 3.3*  CL 100* 104 104 100* 103 104 103 102  CO2 31 33* 35* 35* 35* 33* 32 31  GLUCOSE 198* 148* 163* 153* 153* 136* 156* 217*  BUN 11 9 7 6 7 10 12 13   CREATININE 0.77 0.68 0.68 0.68 0.76 0.68 0.76 0.70  CALCIUM 7.6* 7.8* 8.0* 8.0* 8.1* 8.3* 8.4* 8.6*  MG 1.3* 2.1 1.4* 1.5* 1.5*  --   --   --   PHOS  --  2.6 3.3 3.6  --   --   --   --    Liver Function Tests:  Recent Labs Lab 02/09/15 0600  AST 19  ALT 12*  ALKPHOS 69  BILITOT 0.4  PROT 6.5  ALBUMIN 2.1*   No results for input(s): LIPASE, AMYLASE in the  last 168 hours. No results for input(s): AMMONIA in the last 168 hours. CBC:  Recent Labs Lab 02/08/15 0430 02/09/15 0600 02/10/15 0515 02/11/15 0400 02/12/15 0700  WBC 18.0* 15.7* 15.2* 16.5* 12.6*  HGB 10.5* 10.3* 10.6* 10.5* 10.8*  HCT 32.5* 32.5* 33.1* 33.1* 33.7*  MCV 88.3 89.5 89.5 89.9 88.9  PLT 651* 675* 696* 729* 686*   Cardiac Enzymes: No results for input(s): CKTOTAL, CKMB, CKMBINDEX, TROPONINI in the last 168 hours. BNP (last 3 results)  Recent Labs  02/01/15 1530  BNP 487.5*    ProBNP (last 3 results) No results for input(s): PROBNP in the last 8760 hours.  CBG:  Recent Labs Lab 02/11/15 0749 02/11/15 1144 02/11/15 1633 02/12/15 0742 02/12/15 1126  GLUCAP 128* 183* 221* 208* 160*    Recent Results (from the past 240 hour(s))  Culture, blood (routine x 2)     Status: None   Collection Time: 02/03/15  1:00 PM  Result Value Ref Range Status   Specimen Description BLOOD LEFT ANTECUBITAL  Final   Special Requests IN PEDIATRIC BOTTLE [place]  Final   Culture   Final    NO GROWTH 5 DAYS Performed at [facility]    Report Status 02/08/2015 FINAL  Final  Culture, Urine  Status: None   Collection Time: 02/03/15  1:00 PM  Result Value Ref Range Status   Specimen Description URINE, CATHETERIZED  Final   Special Requests NONE  Final   Culture   Final    NO GROWTH 2 DAYS Performed at [facility]    Report Status 02/05/2015 FINAL  Final  C difficile quick scan w PCR reflex     Status: None   Collection Time: 02/06/15 10:41 PM  Result Value Ref Range Status   C Diff antigen NEGATIVE NEGATIVE Final   C Diff toxin NEGATIVE NEGATIVE Final   C Diff interpretation Negative for toxigenic C. difficile  Final     Studies: No results found.  Scheduled Meds: . antiseptic oral rinse  7 mL Mouth Rinse QID  . diltiazem  120 mg Oral Daily  . feeding supplement  1 Container Oral TID BM  . furosemide  40 mg Oral BID  . heparin subcutaneous   5,000 Units Subcutaneous 3 times per day  . imipenem-cilastatin  500 mg Intravenous 4 times per day  . insulin aspart  0-15 Units Subcutaneous TID WC  . insulin aspart  0-5 Units Subcutaneous QHS  . ipratropium-albuterol  3 mL Nebulization TID  . saccharomyces boulardii  250 mg Oral BID  . sodium chloride  10-40 mL Intracatheter Q12H   Continuous Infusions: . sodium chloride 10 mL/hr at 02/01/15 0940    Principal Problem:   Ruptured appendix Active Problems:   Perforated appendicitis   Hypoglycemia   Acute respiratory failure with hypoxemia   Sepsis ([place])   Acute respiratory failure with hypoxemia (HCC)   Atrial fibrillation (HCC)   Acute diastolic heart failure ([place])    Time spent: 35 min    Tyreik Delahoussaye MD PhD  [facility] Pager [phone]. If 7PM-7AM, please contact night-coverage at [email or web address], password [facility] 02/12/2015, 1:21 PM  LOS: 12 days

## 2015-02-12 NOTE — Progress Notes (Signed)
Occupational Therapy Treatment Patient Details Name: Mercedes Thornton MRN: [id number] DOB: April 28, 1945 Today's Date: 02/12/2015    History of present illness 69 year old female, current smoker, with a PMH of COPD, DM, HLD, who presented to [facility] on 10/19 with nausea/vomiting, abdominal pain, chills and productive cough. Found to have a ruptured appendix with developing pelvic abscess and s/p appendectomy 10/19.  ETT 10/19 - 10/24 and developed Afib so started on amiodarone drip   OT comments  Pt did well walking to bathroom and back with OT           Precautions / Restrictions Precautions Precautions: Fall Precaution Comments: anterior R drain, monitor sats Restrictions Weight Bearing Restrictions: No       Mobility Bed Mobility   Bed Mobility: Supine to Sit     Supine to sit: Min assist Sit to supine: Min assist      Transfers Overall transfer level: Needs assistance Equipment used: 1 person hand held assist Transfers: Sit to/from Stand Sit to Stand: Min guard Stand pivot transfers: Min guard       General transfer comment: walk to bathroom    Balance                                   ADL                           Toilet Transfer: Min guard;Ambulation;Regular Toilet;Comfort height toilet   Toileting- Clothing Manipulation and Hygiene: Min guard;Sit to/from stand                          Cognition   Behavior During Therapy: [facility] for tasks assessed/performed Overall Cognitive Status: Within Functional Limits for tasks assessed                               General Comments      Pertinent Vitals/ Pain       Faces Pain Scale: Hurts a little bit Pain Location: surgical site Pain Descriptors / Indicators: Sore Pain Intervention(s): Monitored during session  Home Living                                              Frequency       Progress Toward Goals  OT Goals(current goals can now be  found in the care plan section)  Progress towards OT goals: Progressing toward goals            End of Session     Activity Tolerance Patient tolerated treatment well   Patient Left in bed;with call bell/phone within reach;with bed alarm set   Nurse Communication Mobility status        Time: 1435-1457 OT Time Calculation (min): 22 min  Charges: OT General Charges $OT Visit: 1 Procedure OT Treatments $Self Care/Home Management : 8-22 mins  Bodin Gorka, Edwena Felty D 02/12/2015, 3:19 PM

## 2015-02-12 NOTE — Progress Notes (Signed)
Patient ID: Mercedes Thornton, female   DOB: Sep 26, 1945, 69 y.o.   MRN: [id number]     [facility]      [address] [place], [place] [address]    Phone: [phone] FAX: [phone]     Subjective: No n/v. Passing flatus.  Minimal drain output. VSS.  Afebrile.  WBC down to 12.6k from 16.5k. Walking.  Uses IS 3-4 times per day, discussed.   Objective:  Vital signs:  Filed Vitals:   02/11/15 2052 02/11/15 2138 02/12/15 0554 02/12/15 0758  BP: 124/71  112/31   Pulse: 97  76   Temp: 98.2 F (36.8 C)  98.4 F (36.9 C)   TempSrc:   Oral   Resp:   18   Height:      Weight:   77.5 kg (170 lb 13.7 oz)   SpO2: 95% 94% 91% 90%    Last BM Date: 02/11/15  Intake/Output   Yesterday:  10/30 0701 - 10/31 0700 In: 700 [P.O.:360; I.V.:140; IV Piggyback:200] Out: 65 [Urine:60; Drains:5] This shift:  Total I/O In: 240 [P.O.:240] Out: -   Physical Exam: General: Pt awake/alert/oriented x4 in no acute distress Abdomen: Soft.  Nondistended.Mildly tender at incisions only.  No evidence of peritonitis.  No incarcerated hernias. RLQ drain, no output in the bulb, but milky output in the drain.    Problem List:   Principal Problem:   Ruptured appendix Active Problems:   Perforated appendicitis   Hypoglycemia   Acute respiratory failure with hypoxemia   Sepsis ([place])   Acute respiratory failure with hypoxemia (HCC)   Atrial fibrillation ([place])   Acute diastolic heart failure ([place])    Results:   Labs: Results for orders placed or performed during the hospital encounter of 01/31/15 (from the past 48 hour(s))  Glucose, capillary     Status: Abnormal   Collection Time: 02/10/15  1:03 PM  Result Value Ref Range   Glucose-Capillary 244 (H) 65 - 99 mg/dL  Glucose, capillary     Status: Abnormal   Collection Time: 02/10/15  4:45 PM  Result Value Ref Range   Glucose-Capillary 218 (H) 65 - 99 mg/dL   Comment 1 Notify RN    Comment 2  Document in Chart   Glucose, capillary     Status: Abnormal   Collection Time: 02/10/15  8:18 PM  Result Value Ref Range   Glucose-Capillary 234 (H) 65 - 99 mg/dL   Comment 1 Notify RN    Comment 2 Document in Chart   Glucose, capillary     Status: Abnormal   Collection Time: 02/10/15 11:49 PM  Result Value Ref Range   Glucose-Capillary 136 (H) 65 - 99 mg/dL   Comment 1 Notify RN    Comment 2 Document in Chart   Glucose, capillary     Status: Abnormal   Collection Time: 02/11/15  3:25 AM  Result Value Ref Range   Glucose-Capillary 184 (H) 65 - 99 mg/dL   Comment 1 Notify RN    Comment 2 Document in Chart   Basic metabolic panel     Status: Abnormal   Collection Time: 02/11/15  4:00 AM  Result Value Ref Range   Sodium 142 135 - 145 mmol/L   Potassium 3.7 3.5 - 5.1 mmol/L   Chloride 103 101 - 111 mmol/L   CO2 32 22 - 32 mmol/L   Glucose, Bld 156 (H) 65 - 99 mg/dL   BUN 12 6 - 20  mg/dL   Creatinine, Ser 0.76 0.44 - 1.00 mg/dL   Calcium 8.4 (L) 8.9 - 10.3 mg/dL   GFR calc non Af Amer >60 >60 mL/min   GFR calc Af Amer >60 >60 mL/min    Comment: (NOTE) The eGFR has been calculated using the CKD EPI equation. This calculation has not been validated in all clinical situations. eGFR's persistently <60 mL/min signify possible Chronic Kidney Disease.    Anion gap 7 5 - 15  CBC     Status: Abnormal   Collection Time: 02/11/15  4:00 AM  Result Value Ref Range   WBC 16.5 (H) 4.0 - 10.5 K/uL   RBC 3.68 (L) 3.87 - 5.11 MIL/uL   Hemoglobin 10.5 (L) 12.0 - 15.0 g/dL   HCT 33.1 (L) 36.0 - 46.0 %   MCV 89.9 78.0 - 100.0 fL   MCH 28.5 26.0 - 34.0 pg   MCHC 31.7 30.0 - 36.0 g/dL   RDW 15.8 (H) 11.5 - 15.5 %   Platelets 729 (H) 150 - 400 K/uL  Glucose, capillary     Status: Abnormal   Collection Time: 02/11/15  7:49 AM  Result Value Ref Range   Glucose-Capillary 128 (H) 65 - 99 mg/dL  Glucose, capillary     Status: Abnormal   Collection Time: 02/11/15 11:44 AM  Result Value Ref  Range   Glucose-Capillary 183 (H) 65 - 99 mg/dL  Glucose, capillary     Status: Abnormal   Collection Time: 02/11/15  4:33 PM  Result Value Ref Range   Glucose-Capillary 221 (H) 65 - 99 mg/dL  Basic metabolic panel     Status: Abnormal   Collection Time: 02/12/15  7:00 AM  Result Value Ref Range   Sodium 140 135 - 145 mmol/L   Potassium 3.3 (L) 3.5 - 5.1 mmol/L   Chloride 102 101 - 111 mmol/L   CO2 31 22 - 32 mmol/L   Glucose, Bld 217 (H) 65 - 99 mg/dL   BUN 13 6 - 20 mg/dL   Creatinine, Ser 0.70 0.44 - 1.00 mg/dL   Calcium 8.6 (L) 8.9 - 10.3 mg/dL   GFR calc non Af Amer >60 >60 mL/min   GFR calc Af Amer >60 >60 mL/min    Comment: (NOTE) The eGFR has been calculated using the CKD EPI equation. This calculation has not been validated in all clinical situations. eGFR's persistently <60 mL/min signify possible Chronic Kidney Disease.    Anion gap 7 5 - 15  CBC     Status: Abnormal   Collection Time: 02/12/15  7:00 AM  Result Value Ref Range   WBC 12.6 (H) 4.0 - 10.5 K/uL   RBC 3.79 (L) 3.87 - 5.11 MIL/uL   Hemoglobin 10.8 (L) 12.0 - 15.0 g/dL   HCT 33.7 (L) 36.0 - 46.0 %   MCV 88.9 78.0 - 100.0 fL   MCH 28.5 26.0 - 34.0 pg   MCHC 32.0 30.0 - 36.0 g/dL   RDW 15.5 11.5 - 15.5 %   Platelets 686 (H) 150 - 400 K/uL  Glucose, capillary     Status: Abnormal   Collection Time: 02/12/15  7:42 AM  Result Value Ref Range   Glucose-Capillary 208 (H) 65 - 99 mg/dL    Imaging / Studies: No results found.  Medications / Allergies:  Scheduled Meds: . antiseptic oral rinse  7 mL Mouth Rinse QID  . diltiazem  120 mg Oral Daily  . feeding supplement  1 Container Oral TID BM  .  furosemide  40 mg Oral BID  . heparin subcutaneous  5,000 Units Subcutaneous 3 times per day  . imipenem-cilastatin  500 mg Intravenous 4 times per day  . insulin aspart  0-15 Units Subcutaneous TID WC  . insulin aspart  0-5 Units Subcutaneous QHS  . ipratropium-albuterol  3 mL Nebulization TID  .  saccharomyces boulardii  250 mg Oral BID  . sodium chloride  10-40 mL Intracatheter Q12H   Continuous Infusions: . sodium chloride 10 mL/hr at 02/01/15 0940   PRN Meds:.sodium chloride, albuterol, clonazepam, lip balm, metoprolol, morphine injection, ondansetron **OR** ondansetron (ZOFRAN) IV, oxyCODONE-acetaminophen  Antibiotics: Anti-infectives    Start     Dose/Rate Route Frequency Ordered Stop   02/02/15 1000  vancomycin (VANCOCIN) IVPB 1000 mg/200 mL premix  Status:  Discontinued     1,000 mg 200 mL/hr over 60 Minutes Intravenous Every 12 hours 02/02/15 0947 02/02/15 1049   02/01/15 2000  fluconazole (DIFLUCAN) IVPB 400 mg  Status:  Discontinued     400 mg 100 mL/hr over 120 Minutes Intravenous Every 24 hours 01/31/15 2033 02/04/15 1442   02/01/15 1200  imipenem-cilastatin (PRIMAXIN) 500 mg in sodium chloride 0.9 % 100 mL IVPB     500 mg 200 mL/hr over 30 Minutes Intravenous 4 times per day 02/01/15 1143     02/01/15 1000  vancomycin (VANCOCIN) 1,250 mg in sodium chloride 0.9 % 250 mL IVPB  Status:  Discontinued     1,250 mg 166.7 mL/hr over 90 Minutes Intravenous Every 24 hours 01/31/15 2033 02/02/15 0947   02/01/15 0600  ciprofloxacin (CIPRO) IVPB 400 mg  Status:  Discontinued     400 mg 200 mL/hr over 60 Minutes Intravenous Every 12 hours 01/31/15 1952 01/31/15 2019   01/31/15 2300  vancomycin (VANCOCIN) 1,250 mg in sodium chloride 0.9 % 250 mL IVPB     1,250 mg 166.7 mL/hr over 90 Minutes Intravenous  Once 01/31/15 2033 01/31/15 2353   01/31/15 2300  fluconazole (DIFLUCAN) IVPB 800 mg     800 mg 200 mL/hr over 120 Minutes Intravenous  Once 01/31/15 2033 02/01/15 0024   01/31/15 2200  ciprofloxacin (CIPRO) IVPB 400 mg  Status:  Discontinued     400 mg 200 mL/hr over 60 Minutes Intravenous Every 12 hours 01/31/15 1559 01/31/15 1952   01/31/15 2200  metroNIDAZOLE (FLAGYL) IVPB 500 mg  Status:  Discontinued     500 mg 100 mL/hr over 60 Minutes Intravenous Every 8 hours  01/31/15 1952 01/31/15 2019   01/31/15 2200  imipenem-cilastatin (PRIMAXIN) 500 mg in sodium chloride 0.9 % 100 mL IVPB  Status:  Discontinued     500 mg 200 mL/hr over 30 Minutes Intravenous 3 times per day 01/31/15 2033 02/01/15 1143   01/31/15 2000  metroNIDAZOLE (FLAGYL) IVPB 500 mg  Status:  Discontinued     500 mg 100 mL/hr over 60 Minutes Intravenous 3 times per day 01/31/15 1559 01/31/15 1952   01/31/15 1800  metroNIDAZOLE (FLAGYL) IVPB 500 mg  Status:  Discontinued     500 mg 100 mL/hr over 60 Minutes Intravenous 4 times per day 01/31/15 1543 01/31/15 1559   01/31/15 1215  metroNIDAZOLE (FLAGYL) IVPB 500 mg     500 mg 100 mL/hr over 60 Minutes Intravenous  Once 01/31/15 1201 01/31/15 1350   01/31/15 1100  cefTRIAXone (ROCEPHIN) 1 g in dextrose 5 % 50 mL IVPB     1 g 100 mL/hr over 30 Minutes Intravenous  Once 01/31/15 1051 01/31/15 1230  Assessment/Plan Perforated appendicitis POD#12 laparoscopic appendectomy/LOA---Dr. Lucia Gaskins  -stable, WBC trending down.  Will hold off on repeating CT since she is improving -continue drain care(62m out but looks purulent) -IS, pain control mobilize ID-Primaxin D#11, continue VTE prophylaxis-SCD/heparin   FEN-no issues Dispo-continue inpatient   EErby Pian [facility] Pager [phone](7A-4:30P)   02/12/2015 10:28 AM

## 2015-02-12 NOTE — Progress Notes (Signed)
CCMD called reporting patient had 6 beats of wide QRS SVT.  MD made aware.  Will continue to monitor closely.

## 2015-02-12 NOTE — Progress Notes (Signed)
Nuclear medicine called and set up myocar for 9am on 11/1.  [facility] called and transportation to [facility] set up.

## 2015-02-12 NOTE — Care Management Important Message (Signed)
Important Message  Patient Details  Name: Kiarah Eckstein MRN: [id number] Date of Birth: July 21, 1945   Medicare Important Message Given:  Yes-third notification given    Camillo Flaming 02/12/2015, 2:49 [place] Message  Patient Details  Name: Shakerria Parran MRN: [id number] Date of Birth: Mar 28, 1946   Medicare Important Message Given:  Yes-third notification given    Camillo Flaming 02/12/2015, 2:49 PM

## 2015-02-12 NOTE — Progress Notes (Signed)
Small amount of bright red blood in patient stool.  Patient stated this is normal and MD is aware.  Will continue to monitor closely.

## 2015-02-13 ENCOUNTER — Ambulatory Visit (HOSPITAL_COMMUNITY)
Admit: 2015-02-13 | Discharge: 2015-02-13 | Disposition: A | Discharge status: Home / Self Care | Payer: Medicare Other | Attending: Cardiology | Admitting: Cardiology

## 2015-02-13 DIAGNOSIS — I472 Ventricular tachycardia: Secondary | ICD-10-CM

## 2015-02-13 DIAGNOSIS — I2510 Atherosclerotic heart disease of native coronary artery without angina pectoris: Secondary | ICD-10-CM

## 2015-02-13 HISTORY — DX: Atherosclerotic heart disease of native coronary artery without angina pectoris: I25.10

## 2015-02-13 LAB — CBC WITH DIFFERENTIAL/PLATELET
BASOS PCT: 0 %
Basophils Absolute: 0 10*3/uL (ref 0.0–0.1)
EOS ABS: 0.2 10*3/uL (ref 0.0–0.7)
EOS PCT: 2 %
HCT: 33.8 % — ABNORMAL LOW (ref 36.0–46.0)
Hemoglobin: 10.6 g/dL — ABNORMAL LOW (ref 12.0–15.0)
Lymphocytes Relative: 17 %
Lymphs Abs: 1.9 10*3/uL (ref 0.7–4.0)
MCH: 28.2 pg (ref 26.0–34.0)
MCHC: 31.4 g/dL (ref 30.0–36.0)
MCV: 89.9 fL (ref 78.0–100.0)
MONO ABS: 1.3 10*3/uL — AB (ref 0.1–1.0)
MONOS PCT: 12 %
Neutro Abs: 7.4 10*3/uL (ref 1.7–7.7)
Neutrophils Relative %: 69 %
PLATELETS: 667 10*3/uL — AB (ref 150–400)
RBC: 3.76 MIL/uL — ABNORMAL LOW (ref 3.87–5.11)
RDW: 15.8 % — AB (ref 11.5–15.5)
WBC: 10.9 10*3/uL — ABNORMAL HIGH (ref 4.0–10.5)

## 2015-02-13 LAB — NM MYOCAR MULTI W/SPECT W/WALL MOTION / EF
CHL CUP MPHR: 152 {beats}/min
CHL CUP RESTING HR STRESS: 77 {beats}/min
CSEPEDS: 0 s
CSEPEW: 1 METS
CSEPHR: 65 %
CSEPPHR: 100 {beats}/min
Exercise duration (min): 0 min

## 2015-02-13 LAB — COMPREHENSIVE METABOLIC PANEL
ALBUMIN: 2.3 g/dL — AB (ref 3.5–5.0)
ALT: 17 U/L (ref 14–54)
ANION GAP: 8 (ref 5–15)
AST: 31 U/L (ref 15–41)
Alkaline Phosphatase: 81 U/L (ref 38–126)
BILIRUBIN TOTAL: 0.6 mg/dL (ref 0.3–1.2)
BUN: 11 mg/dL (ref 6–20)
CO2: 31 mmol/L (ref 22–32)
Calcium: 8.3 mg/dL — ABNORMAL LOW (ref 8.9–10.3)
Chloride: 102 mmol/L (ref 101–111)
Creatinine, Ser: 0.67 mg/dL (ref 0.44–1.00)
GFR calc Af Amer: >60 mL/min (ref >60)
GFR calc non Af Amer: >60 mL/min (ref >60)
GLUCOSE: 227 mg/dL — AB (ref 65–99)
POTASSIUM: 3.3 mmol/L — AB (ref 3.5–5.1)
SODIUM: 141 mmol/L (ref 135–145)
TOTAL PROTEIN: 6.9 g/dL (ref 6.5–8.1)

## 2015-02-13 LAB — GLUCOSE, CAPILLARY
Glucose-Capillary: 218 mg/dL — ABNORMAL HIGH (ref 65–99)
Glucose-Capillary: 225 mg/dL — ABNORMAL HIGH (ref 65–99)
Glucose-Capillary: 205 mg/dL — ABNORMAL HIGH (ref 65–99)
Glucose-Capillary: 143 mg/dL — ABNORMAL HIGH (ref 65–99)

## 2015-02-13 LAB — MAGNESIUM: Magnesium: 1.6 mg/dL — ABNORMAL LOW (ref 1.7–2.4)

## 2015-02-13 LAB — TSH: TSH: 4.524 u[IU]/mL — ABNORMAL HIGH (ref 0.350–4.500)

## 2015-02-13 MED ORDER — ALTEPLASE 2 MG IJ SOLR
2.0000 mg | Freq: Once | INTRAMUSCULAR | Status: AC
  Administered 2015-02-13: 2 mg
  Filled 2015-02-13: qty 2

## 2015-02-13 MED ORDER — REGADENOSON 0.4 MG/5ML IV SOLN
INTRAVENOUS | Status: AC
  Filled 2015-02-13: qty 5

## 2015-02-13 MED ORDER — INSULIN DETEMIR 100 UNIT/ML ~~LOC~~ SOLN
8.0000 [IU] | Freq: Every day | SUBCUTANEOUS | Status: DC
  Administered 2015-02-13 – 2015-02-15 (×2): 8 [IU] via SUBCUTANEOUS
  Filled 2015-02-13 (×4): qty 0.08

## 2015-02-13 MED ORDER — TECHNETIUM TC 99M SESTAMIBI GENERIC - CARDIOLITE
10.0000 | Freq: Once | INTRAVENOUS | Status: AC | PRN
  Administered 2015-02-13: 10 via INTRAVENOUS

## 2015-02-13 MED ORDER — TECHNETIUM TC 99M SESTAMIBI - CARDIOLITE
30.0000 | Freq: Once | INTRAVENOUS | Status: AC | PRN
  Administered 2015-02-13: 30 via INTRAVENOUS

## 2015-02-13 MED ORDER — MAGNESIUM SULFATE 2 GM/50ML IV SOLN
2.0000 g | Freq: Once | INTRAVENOUS | Status: AC
  Administered 2015-02-13: 2 g via INTRAVENOUS
  Filled 2015-02-13: qty 50

## 2015-02-13 MED ORDER — POTASSIUM CHLORIDE CRYS ER 20 MEQ PO TBCR
40.0000 meq | EXTENDED_RELEASE_TABLET | Freq: Once | ORAL | Status: AC
  Administered 2015-02-13: 40 meq via ORAL
  Filled 2015-02-13: qty 2

## 2015-02-13 MED ORDER — REGADENOSON 0.4 MG/5ML IV SOLN
0.4000 mg | Freq: Once | INTRAVENOUS | Status: AC
  Administered 2015-02-13: 0.4 mg via INTRAVENOUS

## 2015-02-13 MED ORDER — ENSURE ENLIVE PO LIQD
237.0000 mL | Freq: Every morning | ORAL | Status: DC
  Administered 2015-02-14: 237 mL via ORAL

## 2015-02-13 NOTE — Progress Notes (Signed)
Note abnormal nuc result below:  IMPRESSION: 1. Findings concerning for inducible ischemia in the lateral wall.  2. Normal left ventricular wall motion.  3. Left ventricular ejection fraction 67%  4. High-risk stress test findings*.  Will discuss with Dr. Radford Pax.  Hilbert Corrigan PA Pager: [phone]

## 2015-02-13 NOTE — Progress Notes (Signed)
Spoke with pt concerning [place] needs. Pt called her daughter on cell while this CM was in her room. Pt and daughter on the telephone had no preference of [place].  Referral was given to [place]. Pt will live with daughter in [place], [place].

## 2015-02-13 NOTE — Progress Notes (Signed)
Nutrition Follow-up  DOCUMENTATION CODES:   Not applicable  INTERVENTION:  - Will d/c Boost Breeze and order Ensure Enlive once per day, this supplement provides 350 kcal and 20 grams of protein - RD will continue to monitor for needs  NUTRITION DIAGNOSIS:   Inadequate protein intake related to inability to eat as evidenced by NPO status. -ongoing this AM  GOAL:   Patient will meet greater than or equal to 90% of their needs -previously meeting  MONITOR:   PO intake, Supplement acceptance, Weight trends, Labs, Skin, I & O's  ASSESSMENT:   69 y/o F, current smoker, with a PMH of COPD, DM, HLD, who presented to [facility] on 10/19 with nausea/vomiting, abdominal pain, chills and productive cough. Found to have a ruptured appendix with developing pelvic abscess. PCCM consulted for admission.   11/1 Per chart review, pt's diet advancement since last assessment:  10/27 @ 0841: FLD 10/28 @ 1004: Soft 10/30 @ 1020: Heart Healthy 11/1 @ 0001: NPO for myoview  Pt has been eating well the past few days; 100% of lunch and dinner 10/30 and 100% breakfast and lunch yesterday (10/31). Boost Breeze was ordered to supplement CLD. Will d/c and add Ensure Enlive once/day although pt likely meeting needs even without supplement at this time.  Medications reviewed. Labs reviewed; CBGs: 563-875 mg/dL, K: 3.3 mmol/L.    10/26  - Pt extubated 10/23 @ ~1217 and diet advanced to CLD 10/25 @ 1358.  - Pt reports having a small amount of liquids yesterday and states that this AM she had cranberry juice and Jello; she tried the chicken broth but did not like it.  - She denies abdominal pain or nausea with intakes. She states she has been feeling hungry yesterday and today.   - Noted weight down 15 lbs since 10/19 which is likely at least partly fluid related; Lasix order in place. Nutrition needs re-estimated s/p extubation.  - Not able to meet needs with current diet order; will order Boost Breeze to  supplement CLD.   10/20 - Patient is currently intubated on ventilator support - MV: 11.8 L/min; Propofol: 10.2 ml/hr (269 kcal) - Pt with mitten restraints in place and awake at time of visit with NGT in place.  - No family or visitors present to provide PTA information.  - Per chart review, pt has gained 9 lbs in the past 5 months.  - No muscle or fat wasting present. Mild edema throughout.   Diet Order:  Diet NPO time specified  Skin:  Wound (see comment) (abdominal incision (01/31/15))  Last BM:  10/31  Height:   Ht Readings from Last 1 Encounters:  01/31/15 5\' 10"  (1.778 m)    Weight:   Wt Readings from Last 1 Encounters:  02/13/15 165 lb 9.1 oz (75.1 kg)    Ideal Body Weight:  68.18 kg (kg)  BMI:  Body mass index is 23.76 kg/(m^2).  Estimated Nutritional Needs:   Kcal:  1550-1750  Protein:  60-70 grams  Fluid:  1.8 L/day  EDUCATION NEEDS:   No education needs identified at this time     Jarome Matin, RD, LDN Inpatient Clinical Dietitian Pager # [phone] After hours/weekend pager # [phone]

## 2015-02-13 NOTE — Progress Notes (Signed)
Inpatient Diabetes Program Recommendations  AACE/ADA: New Consensus Statement on Inpatient Glycemic Control (2015)  Target Ranges:  Prepandial:   less than 140 mg/dL      Peak postprandial:   less than 180 mg/dL (1-2 hours)      Critically ill patients:  140 - 180 mg/dL   Review of Glycemic Control  Diabetes history: DM2 Outpatient Diabetes medications: metformin 500 mg bid, Amaryl 4 mg QAM Current orders for Inpatient glycemic control: Novolog moderate tidwc and hs  Inpatient Diabetes Program Recommendations:  Insulin - Basal: May benefit from small amount basal insulin - Levemir 8 units QHS   Will continue to follow. Thank you. Lorenda Peck, RD, LDN, CDE Inpatient Diabetes Coordinator [phone]

## 2015-02-13 NOTE — Progress Notes (Signed)
Physical Therapy Treatment Patient Details Name: Mercedes Thornton MRN: [id number] DOB: [birth year] Today's Date: 02/13/2015    History of Present Illness      PT Comments    Pt. Able to ambulate to bathroom and down hall 56' with no AD, LOB or rest breaks needed; ambulated on RA for first ~35 ft with o2 sat drop to 87%; put on 2L of o2 and sats increased to 91-94% and ambulated back to bed; bed mobility and transfers at supervision level and patient declined order for RW - stated she has straight cane at home.   Follow Up Recommendations  Home health PT;Supervision/Assistance - 24 hour   SATURATION QUALIFICATIONS: (This note is used to comply with regulatory documentation for home oxygen)  Patient Saturations on Room Air at Rest = 92%  Patient Saturations on Room Air while Ambulating = 87%  Patient Saturations on 2 Liters of oxygen while Ambulating = 90%  Please briefly explain why patient needs home oxygen: required 2 L supplemental oxygen to achieve therapeutic level   Equipment Recommendations  Rolling walker with 5" wheels (patient declines; states she has straight cane at home )    Recommendations for Other Services       Precautions / Restrictions Precautions Precautions: Fall Precaution Comments: anterior R drain, monitor sats Restrictions Weight Bearing Restrictions: No    Mobility  Bed Mobility Overal bed mobility: Modified Independent Bed Mobility: Supine to Sit;Sit to Supine     Supine to sit: Modified independent (Device/Increase time) Sit to supine: Modified independent (Device/Increase time)   General bed mobility comments: needs extra time and bed rails for assist  Transfers Overall transfer level: Modified independent Equipment used: 1 person hand held assist Transfers: Sit to/from Stand Sit to Stand: Modified independent (Device/Increase time);Supervision            Ambulation/Gait Ambulation/Gait assistance: Modified independent  (Device/Increase time);Supervision Ambulation Distance (Feet): 85 Feet Assistive device: None Gait Pattern/deviations: WFL(Within Functional Limits);Step-through pattern     General Gait Details: pt. able to ambulate to bathroom and then 72' in hallway; started on RA with o2 sats dropping to 87% - put on 1L with o2 sats staying between 91-94%; no LOB or rest breaks    Stairs            Wheelchair Mobility    Modified Rankin (Stroke Patients Only)       Balance                                    Cognition Arousal/Alertness: Awake/alert Behavior During Therapy: WFL for tasks assessed/performed Overall Cognitive Status: Within Functional Limits for tasks assessed                      Exercises      General Comments        Pertinent Vitals/Pain Pain Assessment: No/denies pain    Home Living                      Prior Function            PT Goals (current goals can now be found in the care plan section) Acute Rehab PT Goals Time For Goal Achievement: 02/13/15 Potential to Achieve Goals: Good Progress towards PT goals: Progressing toward goals    Frequency  Min 3X/week    PT Plan Current plan remains appropriate    Co-evaluation  End of Session Equipment Utilized During Treatment: Gait belt;Oxygen Activity Tolerance: Patient tolerated treatment well Patient left: in bed;with call bell/phone within reach     Time: 1525-1550 PT Time Calculation (min) (ACUTE ONLY): 25 min  Charges:  $Gait Training: 8-22 mins $Therapeutic Activity: 8-22 mins                    G CodesDenna Haggard, SPTA   02/13/2015 4:10 PM   Pager: [phone]   Reviewed and agree with above Rica Koyanagi  PTA [facility]  Acute  Rehab Pager      [phone]

## 2015-02-13 NOTE — Progress Notes (Signed)
[facility] PROGRESS NOTE  Mercedes Thornton UXL:[id number] DOB: 10-15-1945 DOA: 01/31/2015 PCP: Antonietta Jewel, MD    Interim Summary  Mercedes Thornton is a 69 year old female with a past medical history of chronic objective pulmonary disease, diabetes mellitus, dyslipidemia, admitted on 01/31/2015 presenting with nausea/vomiting/abdominal pain with CT scan of abdomen and pelvis revealing a ruptured appendicitis with developing pelvic abscess. She was taken to the operating room on 01/31/2015 where she underwent laparoscopic appendectomy and evacuation of pelvic abscesses. Procedure was performed by Dr.Newman of general surgery. Postoperatively she went into acute hypoxemic respiratory failure and required ventilatory support. She had sepsis with hypotension requiring IV pressor support. She was treated with broad-spectrum IV and microbial therapy with Primaxin, ciprofloxacin and IV vancomycin. She showed gradual clinical improvement and was extubated on 02/04/2015.                            Assessment/Plan: 1. Acute hypoxemic respiratory failure. -Postoperatively patient requiring ventilator support. Extubated on 02/04/2015.  -Could be secondary to volume overload with imaging studies revealing acute pulmonary edema. Patient also having a history of structural lung disease in setting of chronic obstructive lung disease. -Repeat chest x-ray performed on 02/08/2015 showing diffuse interstitial prominence which could reflect combination of COPD and edema. Atelectasis. -On 02/09/2015 her oxygen requirement increased for which I increased her lasix to 60 mg IV BID, having a positive fluid balance on ins/outs and increasing crackles on exam.  -She subsequently had good diuresis. Follow up CXR showed improvement to edema.  -Working on weaning her supplemental oxygen. Encouraged her to use incentive spirometer and out of bed to chair. -ambulate on room air , check oxygen saturation.  Continue wean oxygen, repeat  cxr 2 view in am.  2.  Ruptured appendicitis -Patient undergoing laparoscopic appendectomy with evacuation of pelvic abscess on 01/31/2015, procedure performed by Dr. Lucia Gaskins of general surgery. -Postoperative course, complicated by sepsis, she remains on antibiotic therapy with Primaxin 500 mg IV every 6 hours. Wbc trending down ( h/o chronic leukocytosis) -Surgery following, surgery recommended total of 14days of abx, hold on repeat imaging due to clinically improving,  Surgery please address drain care Mercedes Thornton /and follow up, patient plans to stay with her daughter in [place] after discharge.   3.  Sepsis -Present on admission, source of infection likely ruptured appendix. Sepsis evidenced by the presence of shock, requiring IV pressor support, acute respiratory failure requiring ventilator support. -Showing clinical improvement. -Blood cultures obtained on 01/31/2015 at 02/03/2015 showing no growth -Remains on Primaxin, eating, d/c ivf.  4. Acute Diatolic CHF/pulmonary edema -TTE performed on 02/02/2015 showing preserved EF  - Ins/outs showing now a net negative fluid balance of -1.2L, diuresing 2.4 L in the last 24 hours.  -now Lasix 40 mg PO BID. No edema, on 2liter oxygen supplement, wean oxygen, repeat cxr  5.  Pericardial effusion. -Patient having a moderate pericardial effusion seen on transthoracic echocardiogram from 02/02/2015. Echo did not reveal evidence of hemodynamic compromise -Repeat transthoracic echocardiogram performed on 02/09/2015 showing small to moderate pleural effusion without evidence of hemodynamic compromise.  6.  A. Fib (paroxysimal , post op) -Patient's hospital course, complicated by A. fib RVR requiring amiodarone drip -She converted to sinus rhythm, will discontinue amiodarone drip, started Cardizem 120 mg by mouth daily as she has converted to sinus rhythm -cardiology consulted.  7.  Hypokalemia -Potassium 3.7 on 02/11/2015 after receiving  replacement.  -Hypokalemia likely secondary to diuresis.   8.  Hypomagnesemia -Replaced  9. NSVT, keep mag>2, k >4, cardiology consulted.  10. noninsulin dependent diabetes, on metformin/amaryl PTA, a1c 7.5, on ssi here since admission, start levemir due to persistent elevated am blood glucose. May need to be discharged on insulin.   Code Status: Full code Family Communication: patient Disposition Plan: d/c home with daughter in [place] and home health in 1-2 days if medically stable   Consultants:  Pulmonary critical care medicine  General surgery  Procedures:  Laparoscopic appendectomy procedure performed on 01/31/2015  Antibiotics:  Permaxin  HPI/Subjective:  returned from stress test, denies chest pain, no abdominal pain, reported stool start to form, no blood. On 2liter oxygen supplement.   Objective: Filed Vitals:   02/13/15 1242  BP: 143/114  Pulse: 81  Temp: 97.8 F (36.6 C)  Resp: 22    Intake/Output Summary (Last 24 hours) at 02/13/15 1256 Last data filed at 02/13/15 9767  Gross per 24 hour  Intake    555 ml  Output    253 ml  Net    302 ml   Filed Weights   02/11/15 0345 02/12/15 0554 02/13/15 0627  Weight: 168 lb 6.9 oz (76.4 kg) 170 lb 13.7 oz (77.5 kg) 165 lb 9.1 oz (75.1 kg)    Exam:   General:  NAD, but frail  Cardiovascular: Regular rate rhythm normal S1-S2  Respiratory: diminished, no rales, no rhonchi, no wheezing  Abdomen: Surgical incision sites appears clean, abd soft nontender nondistended, RLQ drain with minimal drain, no blood,   Musculoskeletal: trace edema to lower extremities has resolved.   Data Reviewed: Basic Metabolic Panel:  Recent Labs Lab 02/07/15 0442 02/08/15 0430 02/09/15 0600 02/10/15 0515 02/11/15 0400 02/12/15 0700 02/12/15 1546 02/13/15 0435  NA 150* 145 146* 144 142 140  --  141  K 2.9* 2.9* 3.3* 4.0 3.7 3.3*  --  3.3*  CL 104 100* 103 104 103 102  --  102  CO2 35* 35* 35* 33* 32 31  --  31   GLUCOSE 163* 153* 153* 136* 156* 217*  --  227*  BUN 7 6 7 10 12 13   --  11  CREATININE 0.68 0.68 0.76 0.68 0.76 0.70  --  0.67  CALCIUM 8.0* 8.0* 8.1* 8.3* 8.4* 8.6*  --  8.3*  MG 1.4* 1.5* 1.5*  --   --   --  1.8 1.6*  PHOS 3.3 3.6  --   --   --   --   --   --    Liver Function Tests:  Recent Labs Lab 02/09/15 0600 02/13/15 0435  AST 19 31  ALT 12* 17  ALKPHOS 69 81  BILITOT 0.4 0.6  PROT 6.5 6.9  ALBUMIN 2.1* 2.3*   No results for input(s): LIPASE, AMYLASE in the last 168 hours. No results for input(s): AMMONIA in the last 168 hours. CBC:  Recent Labs Lab 02/09/15 0600 02/10/15 0515 02/11/15 0400 02/12/15 0700 02/13/15 0435  WBC 15.7* 15.2* 16.5* 12.6* 10.9*  NEUTROABS  --   --   --   --  7.4  HGB 10.3* 10.6* 10.5* 10.8* 10.6*  HCT 32.5* 33.1* 33.1* 33.7* 33.8*  MCV 89.5 89.5 89.9 88.9 89.9  PLT 675* 696* 729* 686* 667*   Cardiac Enzymes: No results for input(s): CKTOTAL, CKMB, CKMBINDEX, TROPONINI in the last 168 hours. BNP (last 3 results)  Recent Labs  02/01/15 1530  BNP 487.5*    ProBNP (last 3 results) No results for input(s): PROBNP in  the last 8760 hours.  CBG:  Recent Labs Lab 02/12/15 0742 02/12/15 1126 02/12/15 1655 02/12/15 2138 02/13/15 0742  GLUCAP 208* 160* 193* 188* 218*    Recent Results (from the past 240 hour(s))  Culture, blood (routine x 2)     Status: None   Collection Time: 02/03/15  1:00 PM  Result Value Ref Range Status   Specimen Description BLOOD LEFT ANTECUBITAL  Final   Special Requests IN PEDIATRIC BOTTLE [place]  Final   Culture   Final    NO GROWTH 5 DAYS Performed at [facility]    Report Status 02/08/2015 FINAL  Final  Culture, Urine     Status: None   Collection Time: 02/03/15  1:00 PM  Result Value Ref Range Status   Specimen Description URINE, CATHETERIZED  Final   Special Requests NONE  Final   Culture   Final    NO GROWTH 2 DAYS Performed at [facility]    Report Status 02/05/2015  FINAL  Final  C difficile quick scan w PCR reflex     Status: None   Collection Time: 02/06/15 10:41 PM  Result Value Ref Range Status   C Diff antigen NEGATIVE NEGATIVE Final   C Diff toxin NEGATIVE NEGATIVE Final   C Diff interpretation Negative for toxigenic C. difficile  Final     Studies: No results found.  Scheduled Meds: . antiseptic oral rinse  7 mL Mouth Rinse QID  . diltiazem  120 mg Oral Daily  . feeding supplement (ENSURE ENLIVE)  237 mL Oral q morning - 10a  . furosemide  40 mg Oral BID  . heparin subcutaneous  5,000 Units Subcutaneous 3 times per day  . imipenem-cilastatin  500 mg Intravenous 4 times per day  . insulin aspart  0-15 Units Subcutaneous TID WC  . insulin aspart  0-5 Units Subcutaneous QHS  . insulin detemir  8 Units Subcutaneous QHS  . ipratropium-albuterol  3 mL Nebulization TID  . magnesium sulfate 1 - 4 g bolus IVPB  2 g Intravenous Once  . potassium chloride  40 mEq Oral Once  . saccharomyces boulardii  250 mg Oral BID  . sodium chloride  10-40 mL Intracatheter Q12H   Continuous Infusions:    Principal Problem:   Ruptured appendix Active Problems:   Perforated appendicitis   Hypoglycemia   Acute respiratory failure with hypoxemia   Sepsis ([place])   Acute respiratory failure with hypoxemia (HCC)   Atrial fibrillation (HCC)   Acute diastolic heart failure (HCC)   Wide-complex tachycardia ([place])    Time spent: 35 min   Lisa-Marie Rueger MD PhD  [facility] Pager [phone]. If 7PM-7AM, please contact night-coverage at [email or web address], password [facility] 02/13/2015, 12:56 PM  LOS: 13 days

## 2015-02-13 NOTE — Progress Notes (Signed)
Patient ID: Mercedes Thornton, female   DOB: 1946-03-20, 69 y.o.   MRN: [id number]     [place]      [address] [place], [place] [address]    Phone: [phone] FAX: [phone]     Subjective: No n/v. Little soreness.  Having BMs. Tolerating POs.  WBC down to 10.9k.  Afebrile.  Objective:  Vital signs:  Filed Vitals:   02/12/15 2131 02/13/15 0218 02/13/15 0627 02/13/15 0844  BP: 109/92 135/56 135/41   Pulse: 82 80 77   Temp: 97.9 F (36.6 C) 98.3 F (36.8 C) 98.6 F (37 C)   TempSrc: Oral Oral Oral   Resp: _0 Height:      Weight:   75.1 kg (165 lb 9.1 oz)   SpO2: 94% 92% 90% 94%    Last BM Date: 02/12/15  Intake/Output   Yesterday:  10/31 0701 - 11/01 0700 In: 765 [P.O.:360; IV Piggyback:400] Out: 103 [Urine:100; Drains:3] This shift:  Total I/O In: 30 [I.V.:30] Out: 150 [Urine:150]   Physical Exam: General: Pt awake/alert/oriented x4 in no acute distress Abdomen: Soft. Nondistended.Mildly tender at incisions only. No evidence of peritonitis. No incarcerated hernias. RLQ drain,minimal milky drainage.     Problem List:   Principal Problem:   Ruptured appendix Active Problems:   Perforated appendicitis   Hypoglycemia   Acute respiratory failure with hypoxemia   Sepsis ([place])   Acute respiratory failure with hypoxemia (HCC)   Atrial fibrillation (HCC)   Acute diastolic heart failure (HCC)   Wide-complex tachycardia (HCC)    Results:   Labs: Results for orders placed or performed during the hospital encounter of 01/31/15 (from the past 48 hour(s))  Glucose, capillary     Status: Abnormal   Collection Time: 02/11/15 11:44 AM  Result Value Ref Range   Glucose-Capillary 183 (H) 65 - 99 mg/dL  Glucose, capillary     Status: Abnormal   Collection Time: 02/11/15  4:33 PM  Result Value Ref Range   Glucose-Capillary 221 (H) 65 - 99 mg/dL  Basic metabolic panel     Status: Abnormal    Collection Time: 02/12/15  7:00 AM  Result Value Ref Range   Sodium 140 135 - 145 mmol/L   Potassium 3.3 (L) 3.5 - 5.1 mmol/L   Chloride 102 101 - 111 mmol/L   CO2 31 22 - 32 mmol/L   Glucose, Bld 217 (H) 65 - 99 mg/dL   BUN 13 6 - 20 mg/dL   Creatinine, Ser 0.70 0.44 - 1.00 mg/dL   Calcium 8.6 (L) 8.9 - 10.3 mg/dL   GFR calc non Af Amer >60 >60 mL/min   GFR calc Af Amer >60 >60 mL/min    Comment: (NOTE) The eGFR has been calculated using the CKD EPI equation. This calculation has not been validated in all clinical situations. eGFR's persistently <60 mL/min signify possible Chronic Kidney Disease.    Anion gap 7 5 - 15  CBC     Status: Abnormal   Collection Time: 02/12/15  7:00 AM  Result Value Ref Range   WBC 12.6 (H) 4.0 - 10.5 K/uL   RBC 3.79 (L) 3.87 - 5.11 MIL/uL   Hemoglobin 10.8 (L) 12.0 - 15.0 g/dL   HCT 33.7 (L) 36.0 - 46.0 %   MCV 88.9 78.0 - 100.0 fL   MCH 28.5 26.0 - 34.0 pg   MCHC 32.0 30.0 - 36.0 g/dL   RDW 15.5 11.5 -  15.5 %   Platelets 686 (H) 150 - 400 K/uL  Glucose, capillary     Status: Abnormal   Collection Time: 02/12/15  7:42 AM  Result Value Ref Range   Glucose-Capillary 208 (H) 65 - 99 mg/dL  Glucose, capillary     Status: Abnormal   Collection Time: 02/12/15 11:26 AM  Result Value Ref Range   Glucose-Capillary 160 (H) 65 - 99 mg/dL  Magnesium     Status: None   Collection Time: 02/12/15  3:46 PM  Result Value Ref Range   Magnesium 1.8 1.7 - 2.4 mg/dL  Glucose, capillary     Status: Abnormal   Collection Time: 02/12/15  4:55 PM  Result Value Ref Range   Glucose-Capillary 193 (H) 65 - 99 mg/dL  Glucose, capillary     Status: Abnormal   Collection Time: 02/12/15  9:38 PM  Result Value Ref Range   Glucose-Capillary 188 (H) 65 - 99 mg/dL   Comment 1 Notify RN    Comment 2 Document in Chart   TSH     Status: Abnormal   Collection Time: 02/13/15  4:35 AM  Result Value Ref Range   TSH 4.524 (H) 0.350 - 4.500 uIU/mL  CBC with  Differential/Platelet     Status: Abnormal   Collection Time: 02/13/15  4:35 AM  Result Value Ref Range   WBC 10.9 (H) 4.0 - 10.5 K/uL   RBC 3.76 (L) 3.87 - 5.11 MIL/uL   Hemoglobin 10.6 (L) 12.0 - 15.0 g/dL   HCT 33.8 (L) 36.0 - 46.0 %   MCV 89.9 78.0 - 100.0 fL   MCH 28.2 26.0 - 34.0 pg   MCHC 31.4 30.0 - 36.0 g/dL   RDW 15.8 (H) 11.5 - 15.5 %   Platelets 667 (H) 150 - 400 K/uL   Neutrophils Relative % 69 %   Neutro Abs 7.4 1.7 - 7.7 K/uL   Lymphocytes Relative 17 %   Lymphs Abs 1.9 0.7 - 4.0 K/uL   Monocytes Relative 12 %   Monocytes Absolute 1.3 (H) 0.1 - 1.0 K/uL   Eosinophils Relative 2 %   Eosinophils Absolute 0.2 0.0 - 0.7 K/uL   Basophils Relative 0 %   Basophils Absolute 0.0 0.0 - 0.1 K/uL  Comprehensive metabolic panel     Status: Abnormal   Collection Time: 02/13/15  4:35 AM  Result Value Ref Range   Sodium 141 135 - 145 mmol/L   Potassium 3.3 (L) 3.5 - 5.1 mmol/L   Chloride 102 101 - 111 mmol/L   CO2 31 22 - 32 mmol/L   Glucose, Bld 227 (H) 65 - 99 mg/dL   BUN 11 6 - 20 mg/dL   Creatinine, Ser 0.67 0.44 - 1.00 mg/dL   Calcium 8.3 (L) 8.9 - 10.3 mg/dL   Total Protein 6.9 6.5 - 8.1 g/dL   Albumin 2.3 (L) 3.5 - 5.0 g/dL   AST 31 15 - 41 U/L   ALT 17 14 - 54 U/L   Alkaline Phosphatase 81 38 - 126 U/L   Total Bilirubin 0.6 0.3 - 1.2 mg/dL   GFR calc non Af Amer >60 >60 mL/min   GFR calc Af Amer >60 >60 mL/min    Comment: (NOTE) The eGFR has been calculated using the CKD EPI equation. This calculation has not been validated in all clinical situations. eGFR's persistently <60 mL/min signify possible Chronic Kidney Disease.    Anion gap 8 5 - 15  Magnesium     Status:  Abnormal   Collection Time: 02/13/15  4:35 AM  Result Value Ref Range   Magnesium 1.6 (L) 1.7 - 2.4 mg/dL  Glucose, capillary     Status: Abnormal   Collection Time: 02/13/15  7:42 AM  Result Value Ref Range   Glucose-Capillary 218 (H) 65 - 99 mg/dL    Imaging / Studies: No results  found.  Medications / Allergies:  Scheduled Meds: . antiseptic oral rinse  7 mL Mouth Rinse QID  . diltiazem  120 mg Oral Daily  . feeding supplement  1 Container Oral TID BM  . furosemide  40 mg Oral BID  . heparin subcutaneous  5,000 Units Subcutaneous 3 times per day  . imipenem-cilastatin  500 mg Intravenous 4 times per day  . insulin aspart  0-15 Units Subcutaneous TID WC  . insulin aspart  0-5 Units Subcutaneous QHS  . ipratropium-albuterol  3 mL Nebulization TID  . magnesium sulfate 1 - 4 g bolus IVPB  2 g Intravenous Once  . potassium chloride  40 mEq Oral Once  . saccharomyces boulardii  250 mg Oral BID  . sodium chloride  10-40 mL Intracatheter Q12H   Continuous Infusions: . sodium chloride 10 mL/hr at 02/01/15 0940   PRN Meds:.sodium chloride, albuterol, clonazepam, lip balm, metoprolol, morphine injection, ondansetron **OR** ondansetron (ZOFRAN) IV, oxyCODONE-acetaminophen  Antibiotics: Anti-infectives    Start     Dose/Rate Route Frequency Ordered Stop   02/02/15 1000  vancomycin (VANCOCIN) IVPB 1000 mg/200 mL premix  Status:  Discontinued     1,000 mg 200 mL/hr over 60 Minutes Intravenous Every 12 hours 02/02/15 0947 02/02/15 1049   02/01/15 2000  fluconazole (DIFLUCAN) IVPB 400 mg  Status:  Discontinued     400 mg 100 mL/hr over 120 Minutes Intravenous Every 24 hours 01/31/15 2033 02/04/15 1442   02/01/15 1200  imipenem-cilastatin (PRIMAXIN) 500 mg in sodium chloride 0.9 % 100 mL IVPB     500 mg 200 mL/hr over 30 Minutes Intravenous 4 times per day 02/01/15 1143     02/01/15 1000  vancomycin (VANCOCIN) 1,250 mg in sodium chloride 0.9 % 250 mL IVPB  Status:  Discontinued     1,250 mg 166.7 mL/hr over 90 Minutes Intravenous Every 24 hours 01/31/15 2033 02/02/15 0947   02/01/15 0600  ciprofloxacin (CIPRO) IVPB 400 mg  Status:  Discontinued     400 mg 200 mL/hr over 60 Minutes Intravenous Every 12 hours 01/31/15 1952 01/31/15 2019   01/31/15 2300  vancomycin  (VANCOCIN) 1,250 mg in sodium chloride 0.9 % 250 mL IVPB     1,250 mg 166.7 mL/hr over 90 Minutes Intravenous  Once 01/31/15 2033 01/31/15 2353   01/31/15 2300  fluconazole (DIFLUCAN) IVPB 800 mg     800 mg 200 mL/hr over 120 Minutes Intravenous  Once 01/31/15 2033 02/01/15 0024   01/31/15 2200  ciprofloxacin (CIPRO) IVPB 400 mg  Status:  Discontinued     400 mg 200 mL/hr over 60 Minutes Intravenous Every 12 hours 01/31/15 1559 01/31/15 1952   01/31/15 2200  metroNIDAZOLE (FLAGYL) IVPB 500 mg  Status:  Discontinued     500 mg 100 mL/hr over 60 Minutes Intravenous Every 8 hours 01/31/15 1952 01/31/15 2019   01/31/15 2200  imipenem-cilastatin (PRIMAXIN) 500 mg in sodium chloride 0.9 % 100 mL IVPB  Status:  Discontinued     500 mg 200 mL/hr over 30 Minutes Intravenous 3 times per day 01/31/15 2033 02/01/15 1143   01/31/15 2000  metroNIDAZOLE (  FLAGYL) IVPB 500 mg  Status:  Discontinued     500 mg 100 mL/hr over 60 Minutes Intravenous 3 times per day 01/31/15 1559 01/31/15 1952   01/31/15 1800  metroNIDAZOLE (FLAGYL) IVPB 500 mg  Status:  Discontinued     500 mg 100 mL/hr over 60 Minutes Intravenous 4 times per day 01/31/15 1543 01/31/15 1559   01/31/15 1215  metroNIDAZOLE (FLAGYL) IVPB 500 mg     500 mg 100 mL/hr over 60 Minutes Intravenous  Once 01/31/15 1201 01/31/15 1350   01/31/15 1100  cefTRIAXone (ROCEPHIN) 1 g in dextrose 5 % 50 mL IVPB     1 g 100 mL/hr over 30 Minutes Intravenous  Once 01/31/15 1051 01/31/15 1230        Assessment/Plan Perforated appendicitis POD#13 laparoscopic appendectomy/LOA---Dr. Lucia Gaskins  -stable, WBC trending down. does not need a repeat CT scan as she is clinically improving.  -continue drain care(61m out but looks purulent) -IS, pain control mobilize CV-stress test today ID-Primaxin D#12/14 total VTE prophylaxis-SCD/heparin  FEN-no issues Dispo-per primary team.  EErby Pian ANP-BC [facility] Pager  [phone](7A-4:30P)   02/13/2015 9:05 AM

## 2015-02-13 NOTE — Progress Notes (Signed)
1 day lexiscan myoview completed without complication. Pending final result by [facility].  Hilbert Corrigan PA Pager: [phone]

## 2015-02-13 NOTE — Progress Notes (Signed)
Discussed Myoview with Dr Radford Pax. She will discuss with pt in am.  Kerin Ransom PA-C 02/13/2015 6:37 PM

## 2015-02-14 ENCOUNTER — Inpatient Hospital Stay (HOSPITAL_COMMUNITY): Payer: Medicare Other

## 2015-02-14 DIAGNOSIS — R9439 Abnormal result of other cardiovascular function study: Secondary | ICD-10-CM | POA: Diagnosis not present

## 2015-02-14 DIAGNOSIS — I472 Ventricular tachycardia, unspecified: Secondary | ICD-10-CM | POA: Insufficient documentation

## 2015-02-14 DIAGNOSIS — A419 Sepsis, unspecified organism: Principal | ICD-10-CM

## 2015-02-14 LAB — BASIC METABOLIC PANEL
Anion gap: 7 (ref 5–15)
BUN: 8 mg/dL (ref 6–20)
CALCIUM: 8.4 mg/dL — AB (ref 8.9–10.3)
CO2: 30 mmol/L (ref 22–32)
CREATININE: 0.61 mg/dL (ref 0.44–1.00)
Chloride: 103 mmol/L (ref 101–111)
GFR calc non Af Amer: >60 mL/min (ref >60)
GLUCOSE: 181 mg/dL — AB (ref 65–99)
Potassium: 3.4 mmol/L — ABNORMAL LOW (ref 3.5–5.1)
Sodium: 140 mmol/L (ref 135–145)

## 2015-02-14 LAB — MAGNESIUM: Magnesium: 1.7 mg/dL (ref 1.7–2.4)

## 2015-02-14 LAB — GLUCOSE, CAPILLARY
GLUCOSE-CAPILLARY: 231 mg/dL — AB (ref 65–99)
GLUCOSE-CAPILLARY: 152 mg/dL — AB (ref 65–99)
GLUCOSE-CAPILLARY: 184 mg/dL — AB (ref 65–99)
Glucose-Capillary: 162 mg/dL — ABNORMAL HIGH (ref 65–99)
Glucose-Capillary: 171 mg/dL — ABNORMAL HIGH (ref 65–99)

## 2015-02-14 LAB — CBC
HEMATOCRIT: 34.3 % — AB (ref 36.0–46.0)
Hemoglobin: 11.1 g/dL — ABNORMAL LOW (ref 12.0–15.0)
MCH: 28.7 pg (ref 26.0–34.0)
MCHC: 32.4 g/dL (ref 30.0–36.0)
MCV: 88.6 fL (ref 78.0–100.0)
PLATELETS: 614 10*3/uL — AB (ref 150–400)
RBC: 3.87 MIL/uL (ref 3.87–5.11)
RDW: 15.5 % (ref 11.5–15.5)
WBC: 11.1 10*3/uL — ABNORMAL HIGH (ref 4.0–10.5)

## 2015-02-14 MED ORDER — MAGNESIUM SULFATE 2 GM/50ML IV SOLN
2.0000 g | Freq: Once | INTRAVENOUS | Status: AC
  Administered 2015-02-14: 2 g via INTRAVENOUS
  Filled 2015-02-14: qty 50

## 2015-02-14 MED ORDER — SODIUM CHLORIDE 0.9 % IJ SOLN: 3.0000 mL | Freq: Two times a day (BID) | INTRAMUSCULAR | Status: DC

## 2015-02-14 MED ORDER — SODIUM CHLORIDE 0.9 % IV SOLN: 250.0000 mL | INTRAVENOUS | Status: DC | PRN

## 2015-02-14 MED ORDER — SODIUM CHLORIDE 0.9 % IJ SOLN: 3.0000 mL | INTRAMUSCULAR | Status: DC | PRN

## 2015-02-14 MED ORDER — METOPROLOL TARTRATE 25 MG PO TABS
25.0000 mg | ORAL_TABLET | Freq: Two times a day (BID) | ORAL | Status: DC
  Administered 2015-02-14 – 2015-02-16 (×5): 25 mg via ORAL
  Filled 2015-02-14 (×5): qty 1

## 2015-02-14 MED ORDER — SODIUM CHLORIDE 0.9 % IV SOLN
INTRAVENOUS | Status: DC
  Administered 2015-02-15: 05:00:00 via INTRAVENOUS

## 2015-02-14 MED ORDER — ASPIRIN 81 MG PO CHEW: 81.0000 mg | CHEWABLE_TABLET | ORAL | Status: DC

## 2015-02-14 MED ORDER — POTASSIUM CHLORIDE CRYS ER 20 MEQ PO TBCR
40.0000 meq | EXTENDED_RELEASE_TABLET | Freq: Once | ORAL | Status: AC
  Administered 2015-02-14: 40 meq via ORAL
  Filled 2015-02-14: qty 2

## 2015-02-14 MED ORDER — ASPIRIN EC 81 MG PO TBEC
81.0000 mg | DELAYED_RELEASE_TABLET | Freq: Every day | ORAL | Status: DC
  Administered 2015-02-14 – 2015-02-16 (×3): 81 mg via ORAL
  Filled 2015-02-14 (×3): qty 1

## 2015-02-14 NOTE — Progress Notes (Signed)
Physical Therapy Treatment Patient Details Name: Mercedes Thornton MRN: [id number] DOB: Nov 12, 1945 Today's Date: 02/14/2015    History of Present Illness 69 year old female, current smoker, with a PMH of COPD, DM, HLD, who presented to [facility] on 10/19 with nausea/vomiting, abdominal pain, chills and productive cough. Found to have a ruptured appendix with developing pelvic abscess and s/p appendectomy 10/19.  ETT 10/19 - 10/24 and developed Afib so started on amiodarone drip    PT Comments    Pt feeling better but reports poor sleep at night.  Feeling "very tired". Assisted to bathroom then amb a great distance in hallway with out need for an assisted device.  Pt declines any rec for a walker.    Follow Up Recommendations  Supervision/Assistance - 24 hour;Home health PT (pt might decline)     Equipment Recommendations  Rolling walker with 5" wheels (pt may decline)    Recommendations for Other Services       Precautions / Restrictions Precautions Precautions: Fall Precaution Comments: monitor O2 sats Restrictions Weight Bearing Restrictions: No    Mobility  Bed Mobility Overal bed mobility: Modified Independent       Supine to sit: Modified independent (Device/Increase time)     General bed mobility comments: needs extra time and bed rails for assist  Transfers Overall transfer level: Modified independent Equipment used: 1 person hand held assist Transfers: Sit to/from Stand Sit to Stand: Modified independent (Device/Increase time);Supervision         General transfer comment: good safety cognition and use of hands to steady self.    Ambulation/Gait Ambulation/Gait assistance: Supervision Ambulation Distance (Feet): 145 Feet Assistive device: None Gait Pattern/deviations: Step-through pattern Gait velocity: WFL   General Gait Details: amb to bathroom then in hallway an increased distance. Amb on RA lowest 91%.     Stairs            Wheelchair Mobility     Modified Rankin (Stroke Patients Only)       Balance                                    Cognition Arousal/Alertness: Awake/alert Behavior During Therapy: WFL for tasks assessed/performed Overall Cognitive Status: Within Functional Limits for tasks assessed                      Exercises      General Comments        Pertinent Vitals/Pain Pain Assessment: No/denies pain    Home Living                      Prior Function            PT Goals (current goals can now be found in the care plan section) Progress towards PT goals: Progressing toward goals    Frequency  Min 3X/week    PT Plan Current plan remains appropriate    Co-evaluation             End of Session Equipment Utilized During Treatment: Gait belt Activity Tolerance: Patient tolerated treatment well Patient left: in bed;with call bell/phone within reach     Time: 0945-1000 PT Time Calculation (min) (ACUTE ONLY): 15 min  Charges:  $Gait Training: 8-22 mins                    G Codes:  Rica Koyanagi  PTA [facility]  Acute  Rehab Pager      [phone]

## 2015-02-14 NOTE — Progress Notes (Signed)
Patient ID: Mercedes Thornton, female   DOB: 1946-03-27, 69 y.o.   MRN: [id number]     [facility]      [address] [place], [place] [address]    Phone: [phone] FAX: [phone]     Subjective:  WBC slightly up, but insignificant.  VSS   Afebrile.  Tolerating POs, having BMs.   Objective:  Vital signs:  Filed Vitals:   02/13/15 0844 02/13/15 1242 02/13/15 2012 02/14/15 0529  BP:  143/114 126/64 117/61  Pulse:  81 81 79  Temp:  97.8 F (36.6 C) 97.7 F (36.5 C) 98.2 F (36.8 C)  TempSrc:  Oral Oral Oral  Resp:  '22 19 19  ' Height:      Weight:    74.9 kg (165 lb 2 oz)  SpO2: 94% 98% 97%     Last BM Date: 02/13/15  Intake/Output   Yesterday:  11/01 0701 - 11/02 0700 In: 470 [P.O.:240; I.V.:30; IV Piggyback:200] Out: 1000 [Urine:1000] This shift:     Physical Exam: General: Pt awake/alert/oriented x4 in no acute distress Abdomen: Soft. Nondistended.Mildly tender at incisions only. No evidence of peritonitis. No incarcerated hernias. RLQ drain,minimal milky drainage.    Problem List:   Principal Problem:   Ruptured appendix Active Problems:   Perforated appendicitis   Hypoglycemia   Acute respiratory failure with hypoxemia   Sepsis ([place])   Acute respiratory failure with hypoxemia (HCC)   Atrial fibrillation (HCC)   Acute diastolic heart failure ([place])   Wide-complex tachycardia ([place])    Results:   Labs: Results for orders placed or performed during the hospital encounter of 01/31/15 (from the past 48 hour(s))  Glucose, capillary     Status: Abnormal   Collection Time: 02/12/15 11:26 AM  Result Value Ref Range   Glucose-Capillary 160 (H) 65 - 99 mg/dL  Magnesium     Status: None   Collection Time: 02/12/15  3:46 PM  Result Value Ref Range   Magnesium 1.8 1.7 - 2.4 mg/dL  Glucose, capillary     Status: Abnormal   Collection Time: 02/12/15  4:55 PM  Result Value Ref Range   Glucose-Capillary 193  (H) 65 - 99 mg/dL  Glucose, capillary     Status: Abnormal   Collection Time: 02/12/15  9:38 PM  Result Value Ref Range   Glucose-Capillary 188 (H) 65 - 99 mg/dL   Comment 1 Notify RN    Comment 2 Document in Chart   TSH     Status: Abnormal   Collection Time: 02/13/15  4:35 AM  Result Value Ref Range   TSH 4.524 (H) 0.350 - 4.500 uIU/mL  CBC with Differential/Platelet     Status: Abnormal   Collection Time: 02/13/15  4:35 AM  Result Value Ref Range   WBC 10.9 (H) 4.0 - 10.5 K/uL   RBC 3.76 (L) 3.87 - 5.11 MIL/uL   Hemoglobin 10.6 (L) 12.0 - 15.0 g/dL   HCT 33.8 (L) 36.0 - 46.0 %   MCV 89.9 78.0 - 100.0 fL   MCH 28.2 26.0 - 34.0 pg   MCHC 31.4 30.0 - 36.0 g/dL   RDW 15.8 (H) 11.5 - 15.5 %   Platelets 667 (H) 150 - 400 K/uL   Neutrophils Relative % 69 %   Neutro Abs 7.4 1.7 - 7.7 K/uL   Lymphocytes Relative 17 %   Lymphs Abs 1.9 0.7 - 4.0 K/uL   Monocytes Relative 12 %   Monocytes Absolute  1.3 (H) 0.1 - 1.0 K/uL   Eosinophils Relative 2 %   Eosinophils Absolute 0.2 0.0 - 0.7 K/uL   Basophils Relative 0 %   Basophils Absolute 0.0 0.0 - 0.1 K/uL  Comprehensive metabolic panel     Status: Abnormal   Collection Time: 02/13/15  4:35 AM  Result Value Ref Range   Sodium 141 135 - 145 mmol/L   Potassium 3.3 (L) 3.5 - 5.1 mmol/L   Chloride 102 101 - 111 mmol/L   CO2 31 22 - 32 mmol/L   Glucose, Bld 227 (H) 65 - 99 mg/dL   BUN 11 6 - 20 mg/dL   Creatinine, Ser 0.67 0.44 - 1.00 mg/dL   Calcium 8.3 (L) 8.9 - 10.3 mg/dL   Total Protein 6.9 6.5 - 8.1 g/dL   Albumin 2.3 (L) 3.5 - 5.0 g/dL   AST 31 15 - 41 U/L   ALT 17 14 - 54 U/L   Alkaline Phosphatase 81 38 - 126 U/L   Total Bilirubin 0.6 0.3 - 1.2 mg/dL   GFR calc non Af Amer >60 >60 mL/min   GFR calc Af Amer >60 >60 mL/min    Comment: (NOTE) The eGFR has been calculated using the CKD EPI equation. This calculation has not been validated in all clinical situations. eGFR's persistently <60 mL/min signify possible Chronic  Kidney Disease.    Anion gap 8 5 - 15  Magnesium     Status: Abnormal   Collection Time: 02/13/15  4:35 AM  Result Value Ref Range   Magnesium 1.6 (L) 1.7 - 2.4 mg/dL  Glucose, capillary     Status: Abnormal   Collection Time: 02/13/15  7:42 AM  Result Value Ref Range   Glucose-Capillary 218 (H) 65 - 99 mg/dL  Glucose, capillary     Status: Abnormal   Collection Time: 02/13/15  1:11 PM  Result Value Ref Range   Glucose-Capillary 225 (H) 65 - 99 mg/dL  Glucose, capillary     Status: Abnormal   Collection Time: 02/13/15  4:50 PM  Result Value Ref Range   Glucose-Capillary 205 (H) 65 - 99 mg/dL  Glucose, capillary     Status: Abnormal   Collection Time: 02/13/15 10:20 PM  Result Value Ref Range   Glucose-Capillary 143 (H) 65 - 99 mg/dL  CBC     Status: Abnormal   Collection Time: 02/14/15  5:45 AM  Result Value Ref Range   WBC 11.1 (H) 4.0 - 10.5 K/uL   RBC 3.87 3.87 - 5.11 MIL/uL   Hemoglobin 11.1 (L) 12.0 - 15.0 g/dL   HCT 34.3 (L) 36.0 - 46.0 %   MCV 88.6 78.0 - 100.0 fL   MCH 28.7 26.0 - 34.0 pg   MCHC 32.4 30.0 - 36.0 g/dL   RDW 15.5 11.5 - 15.5 %   Platelets 614 (H) 150 - 400 K/uL  Basic metabolic panel     Status: Abnormal   Collection Time: 02/14/15  5:45 AM  Result Value Ref Range   Sodium 140 135 - 145 mmol/L   Potassium 3.4 (L) 3.5 - 5.1 mmol/L   Chloride 103 101 - 111 mmol/L   CO2 30 22 - 32 mmol/L   Glucose, Bld 181 (H) 65 - 99 mg/dL   BUN 8 6 - 20 mg/dL   Creatinine, Ser 0.61 0.44 - 1.00 mg/dL   Calcium 8.4 (L) 8.9 - 10.3 mg/dL   GFR calc non Af Amer >60 >60 mL/min   GFR calc Af  Amer >60 >60 mL/min    Comment: (NOTE) The eGFR has been calculated using the CKD EPI equation. This calculation has not been validated in all clinical situations. eGFR's persistently <60 mL/min signify possible Chronic Kidney Disease.    Anion gap 7 5 - 15  Magnesium     Status: None   Collection Time: 02/14/15  5:45 AM  Result Value Ref Range   Magnesium 1.7 1.7 - 2.4  mg/dL  Glucose, capillary     Status: Abnormal   Collection Time: 02/14/15  7:25 AM  Result Value Ref Range   Glucose-Capillary 152 (H) 65 - 99 mg/dL    Imaging / Studies: Dg Chest 2 View  02/14/2015  CLINICAL DATA:  Shortness of breath, history COPD, smoking, diabetes mellitus EXAM: CHEST  2 VIEW COMPARISON:  02/09/2015 FINDINGS: RIGHT arm PICC line tip projects over SVC near cavoatrial junction. Enlargement of cardiac silhouette. Atherosclerotic calcification aorta. Pulmonary vascularity normal. Bronchitic changes with bibasilar atelectasis. Mild interstitial prominence in both lungs unchanged. Pleural thickening/scarring lateral RIGHT upper hemi thorax unchanged. No pleural effusion or pneumothorax. Bones demineralized. IMPRESSION: Enlargement of cardiac silhouette. Bronchitic changes with persistent bibasilar atelectasis. Minimal persistent interstitial prominence question residual infiltrate. Electronically Signed   By: Lavonia Dana M.D.   On: 02/14/2015 08:27   Nm Myocar Multi W/spect W/wall Motion / Ef  02/13/2015  CLINICAL DATA:  Ventricular tachyarrhythmia EXAM: MYOCARDIAL IMAGING WITH SPECT (REST AND PHARMACOLOGIC-STRESS) GATED LEFT VENTRICULAR WALL MOTION STUDY LEFT VENTRICULAR EJECTION FRACTION TECHNIQUE: Standard myocardial SPECT imaging was performed after resting intravenous injection of 10 mCi Tc-86msestamibi. Subsequently, intravenous infusion of Lexiscan was performed under the supervision of the Cardiology staff. At peak effect of the drug, 30 mCi Tc-975mestamibi was injected intravenously and standard myocardial SPECT imaging was performed. Quantitative gated imaging was also performed to evaluate left ventricular wall motion, and estimate left ventricular ejection fraction. COMPARISON:  None. FINDINGS: Perfusion: There is decreased activity noted in the lateral wall on stress images relative to rest images concerning for spleen. Wall Motion: Normal left ventricular wall motion. No  left ventricular dilation. Left Ventricular Ejection Fraction: 67 % End diastolic volume 51 ml End systolic volume 17 ml IMPRESSION: 1. Findings concerning for inducible ischemia in the lateral wall. 2. Normal left ventricular wall motion. 3. Left ventricular ejection fraction 67% 4. High-risk stress test findings*. *2012 Appropriate Use Criteria for Coronary Revascularization Focused Update: J Am Coll Cardiol. 207989;21(1):[email or web address] Electronically Signed   By: KeRolm Baptise.D.   On: 02/13/2015 14:32    Medications / Allergies:  Scheduled Meds: . antiseptic oral rinse  7 mL Mouth Rinse QID  . diltiazem  120 mg Oral Daily  . feeding supplement (ENSURE ENLIVE)  237 mL Oral q morning - 10a  . furosemide  40 mg Oral BID  . heparin subcutaneous  5,000 Units Subcutaneous 3 times per day  . imipenem-cilastatin  500 mg Intravenous 4 times per day  . insulin aspart  0-15 Units Subcutaneous TID WC  . insulin aspart  0-5 Units Subcutaneous QHS  . insulin detemir  8 Units Subcutaneous QHS  . saccharomyces boulardii  250 mg Oral BID  . sodium chloride  10-40 mL Intracatheter Q12H   Continuous Infusions:  PRN Meds:.sodium chloride, albuterol, clonazepam, lip balm, metoprolol, morphine injection, ondansetron **OR** ondansetron (ZOFRAN) IV, oxyCODONE-acetaminophen  Antibiotics: Anti-infectives    Start     Dose/Rate Route Frequency Ordered Stop   02/02/15 1000  vancomycin (VANCOCIN) IVPB 1000 mg/200 mL  premix  Status:  Discontinued     1,000 mg 200 mL/hr over 60 Minutes Intravenous Every 12 hours 02/02/15 0947 02/02/15 1049   02/01/15 2000  fluconazole (DIFLUCAN) IVPB 400 mg  Status:  Discontinued     400 mg 100 mL/hr over 120 Minutes Intravenous Every 24 hours 01/31/15 2033 02/04/15 1442   02/01/15 1200  imipenem-cilastatin (PRIMAXIN) 500 mg in sodium chloride 0.9 % 100 mL IVPB     500 mg 200 mL/hr over 30 Minutes Intravenous 4 times per day  02/01/15 1143     02/01/15 1000  vancomycin (VANCOCIN) 1,250 mg in sodium chloride 0.9 % 250 mL IVPB  Status:  Discontinued     1,250 mg 166.7 mL/hr over 90 Minutes Intravenous Every 24 hours 01/31/15 2033 02/02/15 0947   02/01/15 0600  ciprofloxacin (CIPRO) IVPB 400 mg  Status:  Discontinued     400 mg 200 mL/hr over 60 Minutes Intravenous Every 12 hours 01/31/15 1952 01/31/15 2019   01/31/15 2300  vancomycin (VANCOCIN) 1,250 mg in sodium chloride 0.9 % 250 mL IVPB     1,250 mg 166.7 mL/hr over 90 Minutes Intravenous  Once 01/31/15 2033 01/31/15 2353   01/31/15 2300  fluconazole (DIFLUCAN) IVPB 800 mg     800 mg 200 mL/hr over 120 Minutes Intravenous  Once 01/31/15 2033 02/01/15 0024   01/31/15 2200  ciprofloxacin (CIPRO) IVPB 400 mg  Status:  Discontinued     400 mg 200 mL/hr over 60 Minutes Intravenous Every 12 hours 01/31/15 1559 01/31/15 1952   01/31/15 2200  metroNIDAZOLE (FLAGYL) IVPB 500 mg  Status:  Discontinued     500 mg 100 mL/hr over 60 Minutes Intravenous Every 8 hours 01/31/15 1952 01/31/15 2019   01/31/15 2200  imipenem-cilastatin (PRIMAXIN) 500 mg in sodium chloride 0.9 % 100 mL IVPB  Status:  Discontinued     500 mg 200 mL/hr over 30 Minutes Intravenous 3 times per day 01/31/15 2033 02/01/15 1143   01/31/15 2000  metroNIDAZOLE (FLAGYL) IVPB 500 mg  Status:  Discontinued     500 mg 100 mL/hr over 60 Minutes Intravenous 3 times per day 01/31/15 1559 01/31/15 1952   01/31/15 1800  metroNIDAZOLE (FLAGYL) IVPB 500 mg  Status:  Discontinued     500 mg 100 mL/hr over 60 Minutes Intravenous 4 times per day 01/31/15 1543 01/31/15 1559   01/31/15 1215  metroNIDAZOLE (FLAGYL) IVPB 500 mg     500 mg 100 mL/hr over 60 Minutes Intravenous  Once 01/31/15 1201 01/31/15 1350   01/31/15 1100  cefTRIAXone (ROCEPHIN) 1 g in dextrose 5 % 50 mL IVPB     1 g 100 mL/hr over 30 Minutes Intravenous  Once 01/31/15 1051 01/31/15 1230         Assessment/Plan Perforated  appendicitis POD#13 laparoscopic appendectomy/LOA---Dr. Lucia Gaskins  -stable -DC drain -IS, pain control mobilize CV-positive stress test, awaiting definitive plan from cards.  ID-Primaxin D#13 +rocephin/fagyl.  DC atbx.  VTE prophylaxis-SCD/heparin  FEN-no issues Dispo-per primary team/cards.  Follow up arranged.   Erby Pian, [facility] Pager [phone]-[phone])  02/14/2015 9:45 AM

## 2015-02-14 NOTE — Progress Notes (Signed)
OT Cancellation Note  Patient Details Name: Mercedes Thornton MRN: [id number] DOB: 11-15-1945   Cancelled Treatment:    Reason Eval/Treat Not Completed: Other (comment).  Spoke to [employer].  Pt resting and requesting lunch at 3:00.  Will check back later this afternoon if schedule permits or will try to return tomorrow.  [name] 02/14/2015, 2:40 PM  Lesle Chris, OTR/L [phone] 02/14/2015

## 2015-02-14 NOTE — Progress Notes (Signed)
[facility] PROGRESS NOTE  Mercedes Thornton WYO:[id number] DOB: Oct 26, 1945 DOA: 01/31/2015 PCP: Antonietta Jewel, MD    Interim Summary  [name] is a 69 year old female with a past medical history of chronic objective pulmonary disease, diabetes mellitus, dyslipidemia, admitted on 01/31/2015 presenting with nausea/vomiting/abdominal pain with CT scan of abdomen and pelvis revealing a ruptured appendicitis with developing pelvic abscess. She was taken to the operating room on 01/31/2015 where she underwent laparoscopic appendectomy and evacuation of pelvic abscesses. Procedure was performed by Dr.Newman of general surgery. Postoperatively she went into acute hypoxemic respiratory failure and required ventilatory support. She had sepsis with hypotension requiring IV pressor support. She was treated with broad-spectrum IV and microbial therapy with Primaxin, ciprofloxacin and IV vancomycin. She showed gradual clinical improvement and was extubated on 02/04/2015.         Patient was transferred to [facility], with continued diuresis, abx, she developed NSVTx2, with post op afib/ chf and pericardioeffusion ,cardiology consulted, stress test high risk, she is to [facility] on [facility], if no need of stenting, will come back to [place], if need stenting, she will [facility] and [facility], she will be discharged with home health, she will live with her daughter in [place] once discharged.  Her surgical drain was discontinued on 11/2, abx stopped on 11/2, she will need to follow up with general surgery Dr. Lucia Gaskins.                    Assessment/Plan: 1. Acute hypoxemic respiratory failure. -Postoperatively patient requiring ventilator support. Extubated on 02/04/2015.  -Could be secondary to volume overload with imaging studies revealing acute pulmonary edema. Patient also having a history of structural lung disease in setting of chronic obstructive lung disease. -Repeat  chest x-ray performed on 02/08/2015 showing diffuse interstitial prominence which could reflect combination of COPD and edema. Atelectasis. -received iv lasix, then oral lasix, repeat cxr on 11/2 with improvement, but persistent bibasilar atelectasis, bronchitic changes and minimal persistent interstitial prominence remains,  Continue lasix, continue incentive spirometer, wean oxygen. Need home o2?  2.  Ruptured appendicitis -Patient undergoing laparoscopic appendectomy with evacuation of pelvic abscess on 01/31/2015, procedure performed by Dr. Lucia Gaskins of general surgery. -Postoperative course, complicated by sepsis, she remains on antibiotic therapy with Primaxin 500 mg IV every 6 hours. Wbc trending down ( h/o chronic leukocytosis) -Surgery following, surgery recommended stop abx on 11/2, hold on repeat imaging due to clinically improving, surgical drain removed on 11/2, she will follow up with Dr. Lucia Gaskins outpatient.   3.  Sepsis -Present on admission, source of infection likely ruptured appendix. Sepsis evidenced by the presence of shock, requiring IV pressor support, acute respiratory failure requiring ventilator support. -Blood cultures obtained on 01/31/2015 at 02/03/2015 showing no growth -finished treatment with  Primaxin, last dose on 11/2. -sepsis resolved,  4. Acute Diatolic CHF/pulmonary edema -TTE performed on 02/02/2015 showing preserved EF  - s/p IV lasix -now Lasix 40 mg PO BID. No edema, on 2liter oxygen, continue wean oxygen  5.  Pericardial effusion. -Patient having a moderate pericardial effusion seen on transthoracic echocardiogram from 02/02/2015. Echo did not reveal evidence of hemodynamic compromise -Repeat transthoracic echocardiogram performed on 02/09/2015 showing small to moderate pleural effusion without evidence of hemodynamic compromise.  6.  A. Fib (paroxysimal , post op) -Patient's hospital course, complicated by A. fib RVR requiring amiodarone drip -She  converted to sinus rhythm, amiodarone drip discontinued, was on  Cardizem 120 mg, now  changed to metoprolol per cardiology recommendation. No need of anticoagulation per cardiology. -cardiology consulted.  7.  Hypokalemia -Hypokalemia likely secondary to diuresis, replace prn to keep K >4.  8. Hypomagnesemia -Replace prn to keep mag >2.  9. NSVT, keep mag>2, k >4, cardiology consulted.  10: high risk stress test: denies chest pain, on asa/statin/betablocker, cardiac cath 11/3.   11. noninsulin dependent diabetes, on metformin/amaryl PTA, a1c 7.5, on ssi here since admission, start levemir due to persistent elevated am blood glucose. May need to be discharged on insulin.   12. smoking cessation education provided.   Code Status: Full code Family Communication: patient Disposition Plan: d/c home with daughter in [place] and home health in 1-2 days if medically stable   Consultants:  Pulmonary critical care medicine  General surgery  cardiology  Procedures:  Laparoscopic appendectomy procedure performed on 01/31/2015  picc line (right)  Antibiotics:  Permaxin stopped on 11/2  HPI/Subjective:  feeling better, denies chest pain, no abdominal pain, reported stool start to form, no blood. Tolerating diet, Surgical drain removed, On 2liter oxygen supplement.   Objective: Filed Vitals:   02/14/15 1327  BP: 141/82  Pulse: 80  Temp: 97.9 F (36.6 C)  Resp:     Intake/Output Summary (Last 24 hours) at 02/14/15 1845 Last data filed at 02/14/15 1811  Gross per 24 hour  Intake    440 ml  Output   1455 ml  Net  -1015 ml   Filed Weights   02/12/15 0554 02/13/15 0627 02/14/15 0529  Weight: 170 lb 13.7 oz (77.5 kg) 165 lb 9.1 oz (75.1 kg) 165 lb 2 oz (74.9 kg)    Exam:   General:  NAD, but frail  Cardiovascular: Regular rate rhythm normal S1-S2  Respiratory: diminished, no rales, no rhonchi, no wheezing  Abdomen: Surgical incision sites appears clean, abd soft  nontender nondistended, RLQ drain removed, dressing intact.  Musculoskeletal: trace edema to lower extremities has resolved.   Data Reviewed: Basic Metabolic Panel:  Recent Labs Lab 02/08/15 0430 02/09/15 0600 02/10/15 0515 02/11/15 0400 02/12/15 0700 02/12/15 1546 02/13/15 0435 02/14/15 0545  NA 145 146* 144 142 140  --  141 140  K 2.9* 3.3* 4.0 3.7 3.3*  --  3.3* 3.4*  CL 100* 103 104 103 102  --  102 103  CO2 35* 35* 33* 32 31  --  31 30  GLUCOSE 153* 153* 136* 156* 217*  --  227* 181*  BUN 6 7 10 12 13   --  11 8  CREATININE 0.68 0.76 0.68 0.76 0.70  --  0.67 0.61  CALCIUM 8.0* 8.1* 8.3* 8.4* 8.6*  --  8.3* 8.4*  MG 1.5* 1.5*  --   --   --  1.8 1.6* 1.7  PHOS 3.6  --   --   --   --   --   --   --    Liver Function Tests:  Recent Labs Lab 02/09/15 0600 02/13/15 0435  AST 19 31  ALT 12* 17  ALKPHOS 69 81  BILITOT 0.4 0.6  PROT 6.5 6.9  ALBUMIN 2.1* 2.3*   No results for input(s): LIPASE, AMYLASE in the last 168 hours. No results for input(s): AMMONIA in the last 168 hours. CBC:  Recent Labs Lab 02/10/15 0515 02/11/15 0400 02/12/15 0700 02/13/15 0435 02/14/15 0545  WBC 15.2* 16.5* 12.6* 10.9* 11.1*  NEUTROABS  --   --   --  7.4  --   HGB 10.6* 10.5* 10.8*  10.6* 11.1*  HCT 33.1* 33.1* 33.7* 33.8* 34.3*  MCV 89.5 89.9 88.9 89.9 88.6  PLT 696* 729* 686* 667* 614*   Cardiac Enzymes: No results for input(s): CKTOTAL, CKMB, CKMBINDEX, TROPONINI in the last 168 hours. BNP (last 3 results)  Recent Labs  02/01/15 1530  BNP 487.5*    ProBNP (last 3 results) No results for input(s): PROBNP in the last 8760 hours.  CBG:  Recent Labs Lab 02/13/15 1650 02/13/15 2220 02/14/15 0725 02/14/15 1204 02/14/15 1637  GLUCAP 205* 143* 152* 184* 162*    Recent Results (from the past 240 hour(s))  C difficile quick scan w PCR reflex     Status: None   Collection Time: 02/06/15 10:41 PM  Result Value Ref Range Status   C Diff antigen NEGATIVE NEGATIVE  Final   C Diff toxin NEGATIVE NEGATIVE Final   C Diff interpretation Negative for toxigenic C. difficile  Final     Studies: Dg Chest 2 View  02/14/2015  CLINICAL DATA:  Shortness of breath, history COPD, smoking, diabetes mellitus EXAM: CHEST  2 VIEW COMPARISON:  02/09/2015 FINDINGS: RIGHT arm PICC line tip projects over SVC near cavoatrial junction. Enlargement of cardiac silhouette. Atherosclerotic calcification aorta. Pulmonary vascularity normal. Bronchitic changes with bibasilar atelectasis. Mild interstitial prominence in both lungs unchanged. Pleural thickening/scarring lateral RIGHT upper hemi thorax unchanged. No pleural effusion or pneumothorax. Bones demineralized. IMPRESSION: Enlargement of cardiac silhouette. Bronchitic changes with persistent bibasilar atelectasis. Minimal persistent interstitial prominence question residual infiltrate. Electronically Signed   By: Lavonia Dana M.D.   On: 02/14/2015 08:27   Nm Myocar Multi W/spect W/wall Motion / Ef  02/13/2015  CLINICAL DATA:  Ventricular tachyarrhythmia EXAM: MYOCARDIAL IMAGING WITH SPECT (REST AND PHARMACOLOGIC-STRESS) GATED LEFT VENTRICULAR WALL MOTION STUDY LEFT VENTRICULAR EJECTION FRACTION TECHNIQUE: Standard myocardial SPECT imaging was performed after resting intravenous injection of 10 mCi Tc-60m sestamibi. Subsequently, intravenous infusion of Lexiscan was performed under the supervision of the Cardiology staff. At peak effect of the drug, 30 mCi Tc-21m sestamibi was injected intravenously and standard myocardial SPECT imaging was performed. Quantitative gated imaging was also performed to evaluate left ventricular wall motion, and estimate left ventricular ejection fraction. COMPARISON:  None. FINDINGS: Perfusion: There is decreased activity noted in the lateral wall on stress images relative to rest images concerning for spleen. Wall Motion: Normal left ventricular wall motion. No left ventricular dilation. Left Ventricular  Ejection Fraction: 67 % End diastolic volume 51 ml End systolic volume 17 ml IMPRESSION: 1. Findings concerning for inducible ischemia in the lateral wall. 2. Normal left ventricular wall motion. 3. Left ventricular ejection fraction 67% 4. High-risk stress test findings*. *2012 Appropriate Use Criteria for Coronary Revascularization Focused Update: J Am Coll Cardiol. 2956;21(3):086-578. [email or web address] Electronically Signed   By: Rolm Baptise M.D.   On: 02/13/2015 14:32    Scheduled Meds: . antiseptic oral rinse  7 mL Mouth Rinse QID  . aspirin EC  81 mg Oral Daily  . feeding supplement (ENSURE ENLIVE)  237 mL Oral q morning - 10a  . furosemide  40 mg Oral BID  . heparin subcutaneous  5,000 Units Subcutaneous 3 times per day  . insulin aspart  0-15 Units Subcutaneous TID WC  . insulin aspart  0-5 Units Subcutaneous QHS  . insulin detemir  8 Units Subcutaneous QHS  . metoprolol tartrate  25 mg Oral BID  . saccharomyces boulardii  250 mg Oral BID  . sodium chloride  10-40 mL Intracatheter Q12H   Continuous  Infusions:    Principal Problem:   Ruptured appendix Active Problems:   Perforated appendicitis   Hypoglycemia   Acute respiratory failure with hypoxemia   Sepsis ([place])   Acute respiratory failure with hypoxemia (HCC)   Atrial fibrillation (HCC)   Acute diastolic heart failure (HCC)   Wide-complex tachycardia (HCC)   Abnormal nuclear stress test    Time spent: 35 min   Quanta Robertshaw MD PhD  [facility] Pager [phone]. If 7PM-7AM, please contact night-coverage at [email or web address], password [facility] 02/14/2015, 6:45 PM  LOS: 14 days

## 2015-02-14 NOTE — Discharge Instructions (Signed)
Laparoscopic Appendectomy, Adult, Care After °Refer to this sheet in the next few weeks. These instructions provide you with information on caring for yourself after your procedure. Your caregiver may also give you more specific instructions. Your treatment has been planned according to current medical practices, but problems sometimes occur. Call your caregiver if you have any problems or questions after your procedure. °HOME CARE INSTRUCTIONS °· Do not drive while taking narcotic pain medicines. °· Use stool softener if you become constipated from your pain medicines. °· Change your bandages (dressings) as directed. °· Keep your wounds clean and dry. You may wash the wounds gently with soap and water. Gently pat the wounds dry with a clean towel. °· Do not take baths, swim, or use hot tubs for 10 days, or as instructed by your caregiver. °· Only take over-the-counter or prescription medicines for pain, discomfort, or fever as directed by your caregiver. °· You may continue your normal diet as directed. °· Do not lift more than 10 pounds (4.5 kg) or play contact sports for 3 weeks, or as directed. °· Slowly increase your activity after surgery. °· Take deep breaths to avoid getting a lung infection (pneumonia). °SEEK MEDICAL CARE IF: °· You have redness, swelling, or increasing pain in your wounds. °· You have pus coming from your wounds. °· You have drainage from a wound that lasts longer than 1 day. °· You notice a bad smell coming from the wounds or dressing. °· Your wound edges break open after stitches (sutures) have been removed. °· You notice increasing pain in the shoulders (shoulder strap areas) or near your shoulder blades. °· You develop dizzy episodes or fainting while standing. °· You develop shortness of breath. °· You develop persistent nausea or vomiting. °· You cannot control your bowel functions or lose your appetite. °· You develop diarrhea. °SEEK IMMEDIATE MEDICAL CARE IF:  °· You have a  fever. °· You develop a rash. °· You have difficulty breathing or sharp pains in your chest. °· You develop any reaction or side effects to medicines given. °MAKE SURE YOU: °· Understand these instructions. °· Will watch your condition. °· Will get help right away if you are not doing well or get worse. °  °This information is not intended to replace advice given to you by your health care provider. Make sure you discuss any questions you have with your health care provider. °  °Document Released: 03/31/2005 Document Revised: 08/15/2014 Document Reviewed: 09/18/2014 °Elsevier Interactive Patient Education ©2016 [employer] ° °CCS ______[facility] °LAPAROSCOPIC SURGERY: POST OP INSTRUCTIONS °Always review your discharge instruction sheet given to you by the facility where your surgery was performed. °IF YOU HAVE DISABILITY OR FAMILY LEAVE FORMS, YOU MUST BRING THEM TO THE OFFICE FOR PROCESSING.   °DO NOT GIVE THEM TO YOUR DOCTOR. ° °1. A prescription for pain medication may be given to you upon discharge.  Take your pain medication as prescribed, if needed.  If narcotic pain medicine is not needed, then you may take acetaminophen (Tylenol) or ibuprofen (Advil) as needed. °2. Take your usually prescribed medications unless otherwise directed. °3. If you need a refill on your pain medication, please contact your pharmacy.  They will contact our office to request authorization. Prescriptions will not be filled after 5pm or on week-ends. °4. You should follow a light diet the first few days after arrival home, such as soup and crackers, etc.  Be sure to include lots of fluids daily. °5. Most   patients will experience some swelling and bruising in the area of the incisions.  Ice packs will help.  Swelling and bruising can take several days to resolve.  °6. It is common to experience some constipation if taking pain medication after surgery.  Increasing fluid intake and taking a stool softener (such as  Colace) will usually help or prevent this problem from occurring.  A mild laxative (Milk of Magnesia or Miralax) should be taken according to package instructions if there are no bowel movements after 48 hours. °7. Unless discharge instructions indicate otherwise, you may remove your bandages 24-48 hours after surgery, and you may shower at that time.  You may have steri-strips (small skin tapes) in place directly over the incision.  These strips should be left on the skin for 7-10 days.  If your surgeon used skin glue on the incision, you may shower in 24 hours.  The glue will flake off over the next 2-3 weeks.  Any sutures or staples will be removed at the office during your follow-up visit. °8. ACTIVITIES:  You may resume regular (light) daily activities beginning the next day--such as daily self-care, walking, climbing stairs--gradually increasing activities as tolerated.  You may have sexual intercourse when it is comfortable.  Refrain from any heavy lifting or straining until approved by your doctor. °a. You may drive when you are no longer taking prescription pain medication, you can comfortably wear a seatbelt, and you can safely maneuver your car and apply brakes. °b. RETURN TO WORK:  __________________________________________________________ °9. You should see your doctor in the office for a follow-up appointment approximately 2-3 weeks after your surgery.  Make sure that you call for this appointment within a day or two after you arrive home to insure a convenient appointment time. °10. OTHER INSTRUCTIONS: __________________________________________________________________________________________________________________________ __________________________________________________________________________________________________________________________ °WHEN TO CALL YOUR DOCTOR: °1. Fever over 101.0 °2. Inability to urinate °3. Continued bleeding from incision. °4. Increased pain, redness, or drainage from the  incision. °5. Increasing abdominal pain ° °The clinic staff is available to answer your questions during regular business hours.  Please don’t hesitate to call and ask to speak to one of the nurses for clinical concerns.  If you have a medical emergency, go to the nearest emergency room or call 911.  A surgeon from [facility] is always on call at the hospital. °[address], [address], [place], [place]  [address] ? [address] [address], [place], [place]   [address] °[phone] ? [phone] ? FAX [phone] °Web site: [email or web address] ° °

## 2015-02-14 NOTE — Progress Notes (Signed)
Patient Profile: 69 year old female with a past medical history of COPD, diabetes mellitus and dyslipidemia but no prior cardiac history who was admitted on 01/31/2015 with abdominal pain in the setting of a ruptured appendicitis with developing pelvic abscesses, s/p laparoscopic appendectomy . Postoperatively she went into acute hypoxemic respiratory failure and required ventilatory support. She had sepsis with hypotension requiring IV pressor support. She was treated with broad-spectrum IV and microbial therapy. Also with post operative Afib requiring IV amio. 2D echo showed normal LVF with mild to moderate pericardial effusion with no tamponade.02/12/15 she had a 7 beat run of WCT. Subsequently, a NST was ordered to assess for ischemia. Interpreted as a high risk study with inducible ischemia in the lateral wall.    Subjective: Feels better overall. Denies CP. No resting dyspnea.   Objective: Vital signs in last 24 hours: Temp:  [97.7 F (36.5 C)-98.2 F (36.8 C)] 98.2 F (36.8 C) (11/02 0529) Pulse Rate:  [77-99] 79 (11/02 0529) Resp:  [19-22] 19 (11/02 0529) BP: (117-156)/(56-114) 117/61 mmHg (11/02 0529) SpO2:  [92 %-98 %] 97 % (11/01 2012) Weight:  [165 lb 2 oz (74.9 kg)] 165 lb 2 oz (74.9 kg) (11/02 0529) Last BM Date: 02/13/15  Intake/Output from previous day: 11/01 0701 - 11/02 0700 In: 470 [P.O.:240; I.V.:30; IV Piggyback:200] Out: 1000 [Urine:1000] Intake/Output this shift: Total I/O In: -  Out: 200 [Urine:200]  Medications Current Facility-Administered Medications  Medication Dose Route Frequency Provider Last Rate Last Dose  . 0.9 %  sodium chloride infusion  250 mL Intravenous PRN Donita Brooks, NP      . albuterol (PROVENTIL) (2.5 MG/3ML) 0.083% nebulizer solution 2.5 mg  2.5 mg Nebulization Q2H PRN Juanito Doom, MD      . antiseptic oral rinse solution (CORINZ)  7 mL Mouth Rinse QID Juanito Doom, MD   7 mL at 02/13/15 1836  . clonazePAM (KLONOPIN)  disintegrating tablet 1 mg  1 mg Oral BID PRN Kelvin Cellar, MD      . diltiazem (CARDIZEM CD) 24 hr capsule 120 mg  120 mg Oral Daily Kelvin Cellar, MD   120 mg at 02/14/15 0749  . feeding supplement (ENSURE ENLIVE) (ENSURE ENLIVE) liquid 237 mL  237 mL Oral q morning - 10a Maricela Bo Ostheim, RD   237 mL at 02/14/15 0748  . furosemide (LASIX) tablet 40 mg  40 mg Oral BID Kelvin Cellar, MD   40 mg at 02/14/15 0749  . heparin injection 5,000 Units  5,000 Units Subcutaneous 3 times per day Juanito Doom, MD   5,000 Units at 02/14/15 0610  . insulin aspart (novoLOG) injection 0-15 Units  0-15 Units Subcutaneous TID WC Kelvin Cellar, MD   3 Units at 02/14/15 [id number]  . insulin aspart (novoLOG) injection 0-5 Units  0-5 Units Subcutaneous QHS Kelvin Cellar, MD   2 Units at 02/11/15 2120  . insulin detemir (LEVEMIR) injection 8 Units  8 Units Subcutaneous QHS Florencia Reasons, MD   8 Units at 02/13/15 2226  . lip balm (CARMEX) ointment   Topical PRN Praveen Mannam, MD      . magnesium sulfate IVPB 2 g 50 mL  2 g Intravenous Once Florencia Reasons, MD   2 g at 02/14/15 1004  . metoprolol (LOPRESSOR) injection 5 mg  5 mg Intravenous Q4H PRN Chesley Mires, MD   5 mg at 02/04/15 1340  . morphine 2 MG/ML injection 1-4 mg  1-4 mg Intravenous Q2H PRN Alphonsa Overall,  MD   2 mg at 02/05/15 0818  . ondansetron (ZOFRAN-ODT) disintegrating tablet 4 mg  4 mg Oral Q6H PRN Alphonsa Overall, MD       Or  . ondansetron [facility]) injection 4 mg  4 mg Intravenous Q6H PRN Alphonsa Overall, MD      . oxyCODONE-acetaminophen (PERCOCET/ROXICET) 5-325 MG per tablet 1-2 tablet  1-2 tablet Oral Q4H PRN Earnstine Regal, PA-C      . saccharomyces boulardii (FLORASTOR) capsule 250 mg  250 mg Oral BID Earnstine Regal, PA-C   250 mg at 02/14/15 0748  . sodium chloride 0.9 % injection 10-40 mL  10-40 mL Intracatheter Q12H Juanito Doom, MD   10 mL at 02/13/15 1443    PE: General appearance: alert, cooperative and no distress Neck: no carotid bruit  and no JVD Lungs: decrease BS at the bases Heart: regular rate and rhythm, S1, S2 normal, no murmur, click, rub or gallop Extremities: no LEE Pulses: 2+ and symmetric Skin: warm and dry Neurologic: Grossly normal  Lab Results:   Recent Labs  02/12/15 0700 02/13/15 0435 02/14/15 0545  WBC 12.6* 10.9* 11.1*  HGB 10.8* 10.6* 11.1*  HCT 33.7* 33.8* 34.3*  PLT 686* 667* 614*   BMET  Recent Labs  02/12/15 0700 02/13/15 0435 02/14/15 0545  NA 140 141 140  K 3.3* 3.3* 3.4*  CL 102 102 103  CO2 31 31 30   GLUCOSE 217* 227* 181*  BUN 13 11 8   CREATININE 0.70 0.67 0.61  CALCIUM 8.6* 8.3* 8.4*    Studies/Results: NST 02/13/15 FINDINGS: Perfusion: There is decreased activity noted in the lateral wall on stress images relative to rest images concerning for spleen.  Wall Motion: Normal left ventricular wall motion. No left ventricular dilation.  Left Ventricular Ejection Fraction: 67 %  End diastolic volume 51 ml  End systolic volume 17 ml  IMPRESSION: 1. Findings concerning for inducible ischemia in the lateral wall.  2. Normal left ventricular wall motion.  3. Left ventricular ejection fraction 67%  4. High-risk stress test findings*.    Assessment/Plan  Principal Problem:   Ruptured appendix Active Problems:   Perforated appendicitis   Hypoglycemia   Acute respiratory failure with hypoxemia   Sepsis ([place])   Acute respiratory failure with hypoxemia (H[facility])   Atrial fibrillation (HCC)   Acute diastolic heart failure (HCC)   Wide-complex tachycardia ([place])   1. Abnormal NST: High risk test findings with concerns for inducible ischemia in the lateral wall. EF 67%. Will need cath prior to discharge.   2. Acute ruptured appendix with forming pelvic abscess: s/p laparoscopic appendectomy - per surgery  3. Acute diastolic CHF secondary to acute illness: - appears euvolemic on exam after IV lasix. Now on PO lasix.   4. Mild to moderate pericardial  effusion: ? Etiology - repeat echo in 2 weeks to ensure this is resolving.  5. Post-operative Atrial Fibrillation: with no further reoccurrence. She has not had any history of PAF in the past and this was in the setting of acute illness. Would not recommend long term anticoagulation at this time unless she has further episodes. This patients CHA2DS2-VASc Score and unadjusted Ischemic Stroke Rate (% per year) is equal to 3.2 % stroke rate/year from a score of 3.  6. Hypokalemia/Hypomagnesemia - replete per IM  7. NSVT: no further recurrence on telemetry. This occurred in the setting of hypokalemia, which has been repleated. However with high risk NST, will also need to r/o coronary ischemia.  8. Tobacco Abuse: 30+ year history. Smoking cessation strongly encouraged.    LOS: 14 days    Brittainy M. Rosita Fire, PA-C 02/14/2015 10:09 AM

## 2015-02-15 ENCOUNTER — Encounter (HOSPITAL_COMMUNITY)
Admission: EM | Disposition: A | Discharge status: Home Health | Payer: Self-pay | Source: Home / Self Care | Attending: Pulmonary Disease

## 2015-02-15 ENCOUNTER — Other Ambulatory Visit: Payer: Self-pay | Admitting: Cardiology

## 2015-02-15 DIAGNOSIS — I3139 Other pericardial effusion (noninflammatory): Secondary | ICD-10-CM

## 2015-02-15 DIAGNOSIS — R931 Abnormal findings on diagnostic imaging of heart and coronary circulation: Secondary | ICD-10-CM

## 2015-02-15 DIAGNOSIS — I2510 Atherosclerotic heart disease of native coronary artery without angina pectoris: Secondary | ICD-10-CM

## 2015-02-15 DIAGNOSIS — I313 Pericardial effusion (noninflammatory): Secondary | ICD-10-CM

## 2015-02-15 HISTORY — PX: CARDIAC CATHETERIZATION: SHX172

## 2015-02-15 LAB — CBC
HCT: 35.3 % — ABNORMAL LOW (ref 36.0–46.0)
HEMOGLOBIN: 11.3 g/dL — AB (ref 12.0–15.0)
MCH: 28.5 pg (ref 26.0–34.0)
MCHC: 32 g/dL (ref 30.0–36.0)
MCV: 88.9 fL (ref 78.0–100.0)
PLATELETS: 592 10*3/uL — AB (ref 150–400)
RBC: 3.97 MIL/uL (ref 3.87–5.11)
RDW: 15.6 % — ABNORMAL HIGH (ref 11.5–15.5)
WBC: 10.2 10*3/uL (ref 4.0–10.5)

## 2015-02-15 LAB — BASIC METABOLIC PANEL
ANION GAP: 7 (ref 5–15)
BUN: 8 mg/dL (ref 6–20)
CHLORIDE: 101 mmol/L (ref 101–111)
CO2: 30 mmol/L (ref 22–32)
Calcium: 8.4 mg/dL — ABNORMAL LOW (ref 8.9–10.3)
Creatinine, Ser: 0.76 mg/dL (ref 0.44–1.00)
Glucose, Bld: 216 mg/dL — ABNORMAL HIGH (ref 65–99)
POTASSIUM: 4.9 mmol/L (ref 3.5–5.1)
SODIUM: 138 mmol/L (ref 135–145)

## 2015-02-15 LAB — T4, FREE: FREE T4: 1.48 ng/dL — AB (ref 0.61–1.12)

## 2015-02-15 LAB — PROTIME-INR
INR: 1.06 (ref 0.00–1.49)
PROTHROMBIN TIME: 14 s (ref 11.6–15.2)

## 2015-02-15 LAB — GLUCOSE, CAPILLARY
GLUCOSE-CAPILLARY: 209 mg/dL — AB (ref 65–99)
GLUCOSE-CAPILLARY: 112 mg/dL — AB (ref 65–99)
Glucose-Capillary: 153 mg/dL — ABNORMAL HIGH (ref 65–99)
Glucose-Capillary: 216 mg/dL — ABNORMAL HIGH (ref 65–99)

## 2015-02-15 LAB — MAGNESIUM: MAGNESIUM: 1.9 mg/dL (ref 1.7–2.4)

## 2015-02-15 SURGERY — LEFT HEART CATH AND CORONARY ANGIOGRAPHY

## 2015-02-15 MED ORDER — FENTANYL CITRATE (PF) 100 MCG/2ML IJ SOLN
INTRAMUSCULAR | Status: DC | PRN
  Administered 2015-02-15 (×2): 25 ug via INTRAVENOUS

## 2015-02-15 MED ORDER — FENTANYL CITRATE (PF) 100 MCG/2ML IJ SOLN
INTRAMUSCULAR | Status: AC
  Filled 2015-02-15: qty 4

## 2015-02-15 MED ORDER — LIDOCAINE HCL (PF) 1 % IJ SOLN
INTRAMUSCULAR | Status: DC | PRN
  Administered 2015-02-15: 15:00:00

## 2015-02-15 MED ORDER — IOHEXOL 350 MG/ML SOLN
INTRAVENOUS | Status: DC | PRN
  Administered 2015-02-15: 45 mL via INTRACARDIAC

## 2015-02-15 MED ORDER — SODIUM CHLORIDE 0.9 % IJ SOLN: 3.0000 mL | Freq: Two times a day (BID) | INTRAMUSCULAR | Status: DC

## 2015-02-15 MED ORDER — MIDAZOLAM HCL 2 MG/2ML IJ SOLN
INTRAMUSCULAR | Status: DC | PRN
  Administered 2015-02-15 (×2): 1 mg via INTRAVENOUS

## 2015-02-15 MED ORDER — LIDOCAINE HCL (PF) 1 % IJ SOLN
INTRAMUSCULAR | Status: AC
  Filled 2015-02-15: qty 30

## 2015-02-15 MED ORDER — NITROGLYCERIN 1 MG/10 ML FOR IR/CATH LAB
INTRA_ARTERIAL | Status: AC
  Filled 2015-02-15: qty 10

## 2015-02-15 MED ORDER — SODIUM CHLORIDE 0.9 % WEIGHT BASED INFUSION: 3.0000 mL/kg/h | INTRAVENOUS | Status: AC

## 2015-02-15 MED ORDER — SODIUM CHLORIDE 0.9 % IJ SOLN: 3.0000 mL | INTRAMUSCULAR | Status: DC | PRN

## 2015-02-15 MED ORDER — HEPARIN SODIUM (PORCINE) 5000 UNIT/ML IJ SOLN
5000.0000 [IU] | Freq: Three times a day (TID) | INTRAMUSCULAR | Status: DC
  Administered 2015-02-15 – 2015-02-16 (×3): 5000 [IU] via SUBCUTANEOUS
  Filled 2015-02-15 (×2): qty 1

## 2015-02-15 MED ORDER — HEPARIN SODIUM (PORCINE) 1000 UNIT/ML IJ SOLN
INTRAMUSCULAR | Status: AC
  Filled 2015-02-15: qty 1

## 2015-02-15 MED ORDER — ACETAMINOPHEN 325 MG PO TABS: 650.0000 mg | ORAL_TABLET | ORAL | Status: DC | PRN

## 2015-02-15 MED ORDER — HEPARIN SODIUM (PORCINE) 1000 UNIT/ML IJ SOLN
INTRAMUSCULAR | Status: DC | PRN
  Administered 2015-02-15: 4000 [IU] via INTRAVENOUS

## 2015-02-15 MED ORDER — MIDAZOLAM HCL 2 MG/2ML IJ SOLN
INTRAMUSCULAR | Status: AC
  Filled 2015-02-15: qty 4

## 2015-02-15 MED ORDER — SODIUM CHLORIDE 0.9 % IV SOLN: 250.0000 mL | INTRAVENOUS | Status: DC | PRN

## 2015-02-15 MED ORDER — HEPARIN (PORCINE) IN NACL 2-0.9 UNIT/ML-% IJ SOLN
INTRAMUSCULAR | Status: AC
  Filled 2015-02-15: qty 1000

## 2015-02-15 MED ORDER — VERAPAMIL HCL 2.5 MG/ML IV SOLN
INTRAVENOUS | Status: DC | PRN
  Administered 2015-02-15: 15:00:00 via INTRA_ARTERIAL

## 2015-02-15 MED ORDER — HEPARIN (PORCINE) IN NACL 2-0.9 UNIT/ML-% IJ SOLN
INTRAMUSCULAR | Status: AC
  Filled 2015-02-15: qty 500

## 2015-02-15 MED ORDER — VERAPAMIL HCL 2.5 MG/ML IV SOLN
INTRAVENOUS | Status: AC
  Filled 2015-02-15: qty 2

## 2015-02-15 SURGICAL SUPPLY — 8 items
CATH OPTITORQUE TIG 4.0 5F (CATHETERS) ×2 IMPLANT
DEVICE RAD COMP TR BAND LRG (VASCULAR PRODUCTS) ×2 IMPLANT
GLIDESHEATH SLEND A-KIT 6F 22G (SHEATH) ×2 IMPLANT
KIT HEART LEFT (KITS) ×2 IMPLANT
PACK CARDIAC CATHETERIZATION (CUSTOM PROCEDURE TRAY) ×2 IMPLANT
TRANSDUCER W/STOPCOCK (MISCELLANEOUS) ×2 IMPLANT
TUBING CIL FLEX 10 FLL-RA (TUBING) ×2 IMPLANT
WIRE SAFE-T 1.5MM-J .035X260CM (WIRE) ×2 IMPLANT

## 2015-02-15 NOTE — Progress Notes (Signed)
Patient ID: Mercedes Thornton, female   DOB: [birth year], 69 y.o.   MRN: [id number]     [address] [place], [place] [address]    Phone: [phone] FAX: [phone]     Subjective: No changes.  WBC normalized.  Afebrile.    Objective:  Vital signs:  Filed Vitals:   02/14/15 1211 02/14/15 1327 02/14/15 2023 02/15/15 0406  BP: 122/70 141/82 127/56 118/56  Pulse: 74 80 72 65  Temp:  97.9 F (36.6 C) 98.1 F (36.7 C) 98.2 F (36.8 C)  TempSrc:  Oral Oral Oral  Resp:   20 20  Height:      Weight:    73.528 kg (162 lb 1.6 oz)  SpO2:  95% 91% 93%    Last BM Date: 02/14/15  Intake/Output   Yesterday:  11/02 0701 - 11/03 0700 In: 159.5 [P.O.:120; I.V.:39.5] Out: 1460 [Urine:1455; Drains:5] This shift:  Total I/O In: -  Out: 100 [Urine:100]   Physical Exam: General: Pt awake/alert/oriented x4 in no acute distress Abdomen: Soft. Nondistended.Mildly tender at incisions only. No evidence of peritonitis. No incarcerated hernias.    Problem List:   Principal Problem:   Ruptured appendix Active Problems:   Perforated appendicitis   Hypoglycemia   Acute respiratory failure with hypoxemia   Sepsis ([place])   Acute respiratory failure with hypoxemia ([facility])   Atrial fibrillation (H[facility])   Acute diastolic heart failure (HCC)   Wide-complex tachycardia (HCC)   Abnormal nuclear stress test   Ventricular tachyarrhythmia ([place])    Results:   Labs: Results for orders placed or performed during the hospital encounter of 01/31/15 (from the past 48 hour(s))  Glucose, capillary     Status: Abnormal   Collection Time: 02/13/15  1:11 PM  Result Value Ref Range   Glucose-Capillary 225 (H) 65 - 99 mg/dL  Glucose, capillary     Status: Abnormal   Collection Time: 02/13/15  4:50 PM  Result Value Ref Range   Glucose-Capillary 205 (H) 65 - 99 mg/dL  Glucose, capillary     Status: Abnormal   Collection Time: 02/13/15  10:20 PM  Result Value Ref Range   Glucose-Capillary 143 (H) 65 - 99 mg/dL  CBC     Status: Abnormal   Collection Time: 02/14/15  5:45 AM  Result Value Ref Range   WBC 11.1 (H) 4.0 - 10.5 K/uL   RBC 3.87 3.87 - 5.11 MIL/uL   Hemoglobin 11.1 (L) 12.0 - 15.0 g/dL   HCT 34.3 (L) 36.0 - 46.0 %   MCV 88.6 78.0 - 100.0 fL   MCH 28.7 26.0 - 34.0 pg   MCHC 32.4 30.0 - 36.0 g/dL   RDW 15.5 11.5 - 15.5 %   Platelets 614 (H) 150 - 400 K/uL  Basic metabolic panel     Status: Abnormal   Collection Time: 02/14/15  5:45 AM  Result Value Ref Range   Sodium 140 135 - 145 mmol/L   Potassium 3.4 (L) 3.5 - 5.1 mmol/L   Chloride 103 101 - 111 mmol/L   CO2 30 22 - 32 mmol/L   Glucose, Bld 181 (H) 65 - 99 mg/dL   BUN 8 6 - 20 mg/dL   Creatinine, Ser 0.61 0.44 - 1.00 mg/dL   Calcium 8.4 (L) 8.9 - 10.3 mg/dL   GFR calc non Af Amer >60 >60 mL/min   GFR calc Af Amer >60 >60 mL/min  Comment: (NOTE) The eGFR has been calculated using the CKD EPI equation. This calculation has not been validated in all clinical situations. eGFR's persistently <60 mL/min signify possible Chronic Kidney Disease.    Anion gap 7 5 - 15  Magnesium     Status: None   Collection Time: 02/14/15  5:45 AM  Result Value Ref Range   Magnesium 1.7 1.7 - 2.4 mg/dL  Glucose, capillary     Status: Abnormal   Collection Time: 02/14/15  7:25 AM  Result Value Ref Range   Glucose-Capillary 152 (H) 65 - 99 mg/dL  Glucose, capillary     Status: Abnormal   Collection Time: 02/14/15 12:04 PM  Result Value Ref Range   Glucose-Capillary 184 (H) 65 - 99 mg/dL  Glucose, capillary     Status: Abnormal   Collection Time: 02/14/15  4:37 PM  Result Value Ref Range   Glucose-Capillary 162 (H) 65 - 99 mg/dL  Glucose, capillary     Status: Abnormal   Collection Time: 02/14/15  8:28 PM  Result Value Ref Range   Glucose-Capillary 171 (H) 65 - 99 mg/dL   Comment 1 Notify RN    Comment 2 Document in Chart   CBC     Status: Abnormal    Collection Time: 02/15/15  6:00 AM  Result Value Ref Range   WBC 10.2 4.0 - 10.5 K/uL   RBC 3.97 3.87 - 5.11 MIL/uL   Hemoglobin 11.3 (L) 12.0 - 15.0 g/dL   HCT 35.3 (L) 36.0 - 46.0 %   MCV 88.9 78.0 - 100.0 fL   MCH 28.5 26.0 - 34.0 pg   MCHC 32.0 30.0 - 36.0 g/dL   RDW 15.6 (H) 11.5 - 15.5 %   Platelets 592 (H) 150 - 400 K/uL  Basic metabolic panel     Status: Abnormal   Collection Time: 02/15/15  6:00 AM  Result Value Ref Range   Sodium 138 135 - 145 mmol/L   Potassium 4.9 3.5 - 5.1 mmol/L    Comment: DELTA CHECK NOTED REPEATED TO VERIFY MODERATE HEMOLYSIS    Chloride 101 101 - 111 mmol/L   CO2 30 22 - 32 mmol/L   Glucose, Bld 216 (H) 65 - 99 mg/dL   BUN 8 6 - 20 mg/dL   Creatinine, Ser 0.76 0.44 - 1.00 mg/dL   Calcium 8.4 (L) 8.9 - 10.3 mg/dL   GFR calc non Af Amer >60 >60 mL/min   GFR calc Af Amer >60 >60 mL/min    Comment: (NOTE) The eGFR has been calculated using the CKD EPI equation. This calculation has not been validated in all clinical situations. eGFR's persistently <60 mL/min signify possible Chronic Kidney Disease.    Anion gap 7 5 - 15  Magnesium     Status: None   Collection Time: 02/15/15  6:00 AM  Result Value Ref Range   Magnesium 1.9 1.7 - 2.4 mg/dL  Protime-INR     Status: None   Collection Time: 02/15/15  6:00 AM  Result Value Ref Range   Prothrombin Time 14.0 11.6 - 15.2 seconds   INR 1.06 0.00 - 1.49  Glucose, capillary     Status: Abnormal   Collection Time: 02/15/15  7:34 AM  Result Value Ref Range   Glucose-Capillary 209 (H) 65 - 99 mg/dL    Imaging / Studies: Dg Chest 2 View  02/14/2015  CLINICAL DATA:  Shortness of breath, history COPD, smoking, diabetes mellitus EXAM: CHEST  2 VIEW COMPARISON:  02/09/2015  FINDINGS: RIGHT arm PICC line tip projects over SVC near cavoatrial junction. Enlargement of cardiac silhouette. Atherosclerotic calcification aorta. Pulmonary vascularity normal. Bronchitic changes with bibasilar atelectasis. Mild  interstitial prominence in both lungs unchanged. Pleural thickening/scarring lateral RIGHT upper hemi thorax unchanged. No pleural effusion or pneumothorax. Bones demineralized. IMPRESSION: Enlargement of cardiac silhouette. Bronchitic changes with persistent bibasilar atelectasis. Minimal persistent interstitial prominence question residual infiltrate. Electronically Signed   By: Lavonia Dana M.D.   On: 02/14/2015 08:27   Nm Myocar Multi W/spect W/wall Motion / Ef  02/13/2015  CLINICAL DATA:  Ventricular tachyarrhythmia EXAM: MYOCARDIAL IMAGING WITH SPECT (REST AND PHARMACOLOGIC-STRESS) GATED LEFT VENTRICULAR WALL MOTION STUDY LEFT VENTRICULAR EJECTION FRACTION TECHNIQUE: Standard myocardial SPECT imaging was performed after resting intravenous injection of 10 mCi [id number]. Subsequently, intravenous infusion of Lexiscan was performed under the supervision of the Cardiology staff. At peak effect of the drug, 30 mCi [id number] was injected intravenously and standard myocardial SPECT imaging was performed. Quantitative gated imaging was also performed to evaluate left ventricular wall motion, and estimate left ventricular ejection fraction. COMPARISON:  None. FINDINGS: Perfusion: There is decreased activity noted in the lateral wall on stress images relative to rest images concerning for spleen. Wall Motion: Normal left ventricular wall motion. No left ventricular dilation. Left Ventricular Ejection Fraction: 67 % End diastolic volume 51 ml End systolic volume 17 ml IMPRESSION: 1. Findings concerning for inducible ischemia in the lateral wall. 2. Normal left ventricular wall motion. 3. Left ventricular ejection fraction 67% 4. High-risk stress test findings*. *2012 Appropriate Use Criteria for Coronary Revascularization Focused Update: J Am Coll Cardiol. 207262;03(5):[email or web address]://[email or web address] Electronically Signed   By: KeRolm Baptise.D.   On: 02/13/2015 14:32     Medications / Allergies:  Scheduled Meds: . antiseptic oral rinse  7 mL Mouth Rinse QID  . aspirin  81 mg Oral Pre-Cath  . aspirin EC  81 mg Oral Daily  . feeding supplement (ENSURE ENLIVE)  237 mL Oral q morning - 10a  . furosemide  40 mg Oral BID  . heparin subcutaneous  5,000 Units Subcutaneous 3 times per day  . insulin aspart  0-15 Units Subcutaneous TID WC  . insulin aspart  0-5 Units Subcutaneous QHS  . insulin detemir  8 Units Subcutaneous QHS  . metoprolol tartrate  25 mg Oral BID  . saccharomyces boulardii  250 mg Oral BID  . sodium chloride  10-40 mL Intracatheter Q12H  . sodium chloride  3 mL Intravenous Q12H   Continuous Infusions: . sodium chloride 10 mL/hr at 02/15/15 0503   PRN Meds:.sodium chloride, sodium chloride, albuterol, clonazepam, lip balm, metoprolol, morphine injection, ondansetron **OR** ondansetron (ZOFRAN) IV, oxyCODONE-acetaminophen, sodium chloride  Antibiotics: Anti-infectives    Start     Dose/Rate Route Frequency Ordered Stop   02/02/15 1000  vancomycin (VANCOCIN) IVPB 1000 mg/200 mL premix  Status:  Discontinued     1,000 mg 200 mL/hr over 60 Minutes Intravenous Every 12 hours 02/02/15 0947 02/02/15 1049   02/01/15 2000  fluconazole (DIFLUCAN) IVPB 400 mg  Status:  Discontinued     400 mg 100 mL/hr over 120 Minutes Intravenous Every 24 hours 01/31/15 2033 02/04/15 1442   02/01/15 1200  imipenem-cilastatin (PRIMAXIN) 500 mg in sodium chloride 0.9 % 100 mL IVPB  Status:  Discontinued     500 mg 200 mL/hr over 30 Minutes Intravenous 4 times per day 02/01/15 1143 02/14/15 0947   02/01/15 1000  vancomycin (VANCOCIN) 1,250 mg in sodium chloride  0.9 % 250 mL IVPB  Status:  Discontinued     1,250 mg 166.7 mL/hr over 90 Minutes Intravenous Every 24 hours 01/31/15 2033 02/02/15 0947   02/01/15 0600  ciprofloxacin (CIPRO) IVPB 400 mg  Status:  Discontinued     400 mg 200 mL/hr over 60 Minutes Intravenous Every 12 hours 01/31/15 1952 01/31/15 2019    01/31/15 2300  vancomycin (VANCOCIN) 1,250 mg in sodium chloride 0.9 % 250 mL IVPB     1,250 mg 166.7 mL/hr over 90 Minutes Intravenous  Once 01/31/15 2033 01/31/15 2353   01/31/15 2300  fluconazole (DIFLUCAN) IVPB 800 mg     800 mg 200 mL/hr over 120 Minutes Intravenous  Once 01/31/15 2033 02/01/15 0024   01/31/15 2200  ciprofloxacin (CIPRO) IVPB 400 mg  Status:  Discontinued     400 mg 200 mL/hr over 60 Minutes Intravenous Every 12 hours 01/31/15 1559 01/31/15 1952   01/31/15 2200  metroNIDAZOLE (FLAGYL) IVPB 500 mg  Status:  Discontinued     500 mg 100 mL/hr over 60 Minutes Intravenous Every 8 hours 01/31/15 1952 01/31/15 2019   01/31/15 2200  imipenem-cilastatin (PRIMAXIN) 500 mg in sodium chloride 0.9 % 100 mL IVPB  Status:  Discontinued     500 mg 200 mL/hr over 30 Minutes Intravenous 3 times per day 01/31/15 2033 02/01/15 1143   01/31/15 2000  metroNIDAZOLE (FLAGYL) IVPB 500 mg  Status:  Discontinued     500 mg 100 mL/hr over 60 Minutes Intravenous 3 times per day 01/31/15 1559 01/31/15 1952   01/31/15 1800  metroNIDAZOLE (FLAGYL) IVPB 500 mg  Status:  Discontinued     500 mg 100 mL/hr over 60 Minutes Intravenous 4 times per day 01/31/15 1543 01/31/15 1559   01/31/15 1215  metroNIDAZOLE (FLAGYL) IVPB 500 mg     500 mg 100 mL/hr over 60 Minutes Intravenous  Once 01/31/15 1201 01/31/15 1350   01/31/15 1100  cefTRIAXone (ROCEPHIN) 1 g in dextrose 5 % 50 mL IVPB     1 g 100 mL/hr over 30 Minutes Intravenous  Once 01/31/15 1051 01/31/15 1230       Assessment/Plan Perforated appendicitis POD#14 laparoscopic appendectomy/LOA---Dr. Lucia Gaskins  -stable -drain out -IS, pain control mobilize CV-positive stress test, LHC today.  VTE prophylaxis-SCD/heparin  FEN-no issues Dispo-per primary team/cards. Follow up arranged.  Erby Pian, [facility] Surgery Pager [phone](7A-4:30P)   02/15/2015 8:33 AM

## 2015-02-15 NOTE — Progress Notes (Signed)
SUBJECTIVE:  No complaints  OBJECTIVE:   Vitals:   Filed Vitals:   02/14/15 1211 02/14/15 1327 02/14/15 2023 02/15/15 0406  BP: 122/70 141/82 127/56 118/56  Pulse: 74 80 72 65  Temp:  97.9 F (36.6 C) 98.1 F (36.7 C) 98.2 F (36.8 C)  TempSrc:  Oral Oral Oral  Resp:   20 20  Height:      Weight:    162 lb 1.6 oz (73.528 kg)  SpO2:  95% 91% 93%   I&O's:   Intake/Output Summary (Last 24 hours) at 02/15/15 1021 Last data filed at 02/15/15 1003  Gross per 24 hour  Intake  159.5 ml  Output   1505 ml  Net -1345.5 ml   TELEMETRY: Reviewed telemetry pt in NSR:     PHYSICAL EXAM General: Well developed, well nourished, in no acute distress Head: Eyes PERRLA, No xanthomas.   Normal cephalic and atramatic  Lungs:   Clear bilaterally to auscultation and percussion. Heart:   HRRR S1 S2 Pulses are 2+ & equal. Abdomen: Bowel sounds are positive, abdomen soft and non-tender without masses  Extremities:   No clubbing, cyanosis or edema.  DP +1 Neuro: Alert and oriented X 3. Psych:  Good affect, responds appropriately   LABS: Basic Metabolic Panel:  Recent Labs  02/14/15 0545 02/15/15 0600  NA 140 138  K 3.4* 4.9  CL 103 101  CO2 30 30  GLUCOSE 181* 216*  BUN 8 8  CREATININE 0.61 0.76  CALCIUM 8.4* 8.4*  MG 1.7 1.9   Liver Function Tests:  Recent Labs  02/13/15 0435  AST 31  ALT 17  ALKPHOS 81  BILITOT 0.6  PROT 6.9  ALBUMIN 2.3*   No results for input(s): LIPASE, AMYLASE in the last 72 hours. CBC:  Recent Labs  02/13/15 0435 02/14/15 0545 02/15/15 0600  WBC 10.9* 11.1* 10.2  NEUTROABS 7.4  --   --   HGB 10.6* 11.1* 11.3*  HCT 33.8* 34.3* 35.3*  MCV 89.9 88.6 88.9  PLT 667* 614* 592*   Cardiac Enzymes: No results for input(s): CKTOTAL, CKMB, CKMBINDEX, TROPONINI in the last 72 hours. BNP: Invalid input(s): POCBNP D-Dimer: No results for input(s): DDIMER in the last 72 hours. Hemoglobin A1C: No results for input(s): HGBA1C in the last 72  hours. Fasting Lipid Panel: No results for input(s): CHOL, HDL, LDLCALC, TRIG, CHOLHDL, LDLDIRECT in the last 72 hours. Thyroid Function Tests:  Recent Labs  02/13/15 0435  TSH 4.524*   Anemia Panel: No results for input(s): VITAMINB12, FOLATE, FERRITIN, TIBC, IRON, RETICCTPCT in the last 72 hours. Coag Panel:   Lab Results  Component Value Date   INR 1.06 02/15/2015   INR 0.92 11/11/2011    RADIOLOGY: Ct Abdomen Pelvis Wo Contrast  01/31/2015  CLINICAL DATA:  Abdominal distension. Abdominal pain. Weakness. Leukocytosis. EXAM: CT ABDOMEN AND PELVIS WITHOUT CONTRAST TECHNIQUE: Multidetector CT imaging of the abdomen and pelvis was performed following the standard protocol without IV contrast. COMPARISON:  Chest CT 08/24/2014 FINDINGS: Subpleural opacities in the right lower lobe likely reflect dependent subsegmental atelectasis and trace pleural fluid or thickening. There is minimal atelectasis or scarring in the left lung base. Coronary artery calcification is noted. Pericardial effusion measures up to 1.5 cm in thickness and overall appears similar in volume to the prior chest CT although with a greater amount of the fluid located posteriorly on the current examination. Small gallstones are noted. No biliary dilatation is seen. The liver, kidneys, and pancreas  have an unremarkable unenhanced appearance. A subcentimeter calcification in the spleen is unchanged, as is minimal bilateral adrenal gland thickening. There is the suggestion of mild distal esophageal wall thickening with a small amount of fluid or debris present, incompletely visualized. The appendix is identified in the right lower and central abdomen and is mildly distended and thick walled diffusely, measuring up to 13 mm in diameter. There is mild surrounding inflammatory stranding, and there is a small locule of extraluminal gas immediately inferior to the proximal appendix (series 2, image 69). More inferiorly in the right adnexa  is a 4.2 x 3.6 cm gas and fluid collection. There is additional fluid and gas in the left pelvis which appears less organized, measuring approximately 9.6 x 3.9 cm (series 2, image 78). There is moderate dilatation of multiple proximal and mid small bowel loops containing air-fluid levels and measuring up to 5.6 cm in diameter. Gas and fluid are also present an nondilated colon. There is mild sigmoid colon wall thickening which is favored to be secondary to adjacent pelvic inflammation. Bladder is unremarkable. Uterus is present. Extensive atherosclerotic vascular calcification is noted. Lower lumbar facet arthrosis is noted. Subcentimeter retroperitoneal lymph nodes are likely reactive. IMPRESSION: 1. Findings most compatible with ruptured appendicitis with likely developing pelvic abscesses. 2. Moderate small bowel dilatation favored to reflect ileus rather than mechanical obstruction. 3. Cholelithiasis. 4. Unchanged pericardial effusion. 5. Distal esophageal wall thickening, query esophagitis. These results were called by telephone at the time of interpretation on 01/31/2015 at 11:51 am to Dr. Tyrone Nine, who verbally acknowledged these results. Electronically Signed   By: Logan Bores M.D.   On: 01/31/2015 11:58   Dg Chest 2 View  02/14/2015  CLINICAL DATA:  Shortness of breath, history COPD, smoking, diabetes mellitus EXAM: CHEST  2 VIEW COMPARISON:  02/09/2015 FINDINGS: RIGHT arm PICC line tip projects over SVC near cavoatrial junction. Enlargement of cardiac silhouette. Atherosclerotic calcification aorta. Pulmonary vascularity normal. Bronchitic changes with bibasilar atelectasis. Mild interstitial prominence in both lungs unchanged. Pleural thickening/scarring lateral RIGHT upper hemi thorax unchanged. No pleural effusion or pneumothorax. Bones demineralized. IMPRESSION: Enlargement of cardiac silhouette. Bronchitic changes with persistent bibasilar atelectasis. Minimal persistent interstitial prominence  question residual infiltrate. Electronically Signed   By: Lavonia Dana M.D.   On: 02/14/2015 08:27   Dg Chest 2 View  02/09/2015  CLINICAL DATA:  Short of breath EXAM: CHEST  2 VIEW COMPARISON:  02/08/2015 FINDINGS: Mild cardiomegaly. Small bilateral effusions are not significantly changed. Diffuse interstitial lung disease is improved. No pneumothorax. Stable right PICC. IMPRESSION: Stable small pleural effusions. Diffuse interstitial disease is improved likely due to improving edema. Electronically Signed   By: Marybelle Killings M.D.   On: 02/09/2015 11:51   Dg Chest 2 View  01/31/2015  CLINICAL DATA:  Cough and congestion with increased weakness over the past 5 days. EXAM: CHEST  2 VIEW COMPARISON:  Chest CT 08/24/2014 FINDINGS: Cardiopericardial enlargement appears increased compared to scanogram from previous study, when pericardial effusion was present, but there is rotation to the left. Stable aortic and hilar contours. Poorly defined left diaphragm, likely from cardiac contact, with no pleural effusion seen on lateral imaging. Mild interstitial coarsening correlating with emphysema on previous CT. There is no edema, consolidation, or pneumothorax. IMPRESSION: 1. Cardiopericardial enlargement with apparent increase from chest CT 08/24/2014 potentially due to rotation. Given a pericardial effusion was present on the comparison CT, consider echocardiogram follow-up. 2. Emphysema without superimposed pneumonia or edema. Electronically Signed   By:  Monte Fantasia M.D.   On: 01/31/2015 10:33   Ct Abdomen Pelvis W Contrast  02/05/2015  CLINICAL DATA:  Leukocytosis, post appendectomy and lysis of adhesions on 01/31/2015, underlying COPD, diabetes mellitus, smoker EXAM: CT ABDOMEN AND PELVIS WITH CONTRAST TECHNIQUE: Multidetector CT imaging of the abdomen and pelvis was performed using the standard protocol following bolus administration of intravenous contrast. Sagittal and coronal MPR images reconstructed  from axial data set. CONTRAST:  127mL OMNIPAQUE IOHEXOL 300 MG/ML SOLN IV. Oral contrast was not administered. COMPARISON:  01/31/2015 FINDINGS: Bibasilar atelectasis, difficult to exclude mild consolidation in lower lobes. Small pericardial effusion. Liver, spleen, pancreas, pancreas, and adrenal glands normal appearance. Dependent gallstone in gallbladder. Nasogastric tube in stomach. Surgical drain in pelvis and RIGHT lower quadrant. Few small bowel loops superior mildly distended without definite evidence of obstruction. Mild bowel wall thickening of small bowel loops in pelvis with scattered ascites and areas of enhancement question peritonitis Focal cul-de-sac fluid collection measures up to 8.8 x 1.5 x 3.5 cm in size question abscess. Numerous additional small interloop collections of fluid are identified which do not demonstrate the same degree of margination or enhancement Air and Foley catheter within urinary bladder. Unremarkable uterus and adnexa. Colon decompressed. No mass or adenopathy. Scattered atherosclerotic calcifications without aneurysm. No free air or hernia. Bones unremarkable. IMPRESSION: Post appendectomy with scattered inflammatory changes in the pelvis, which include bowel wall thickening and enhancement of multiple small bowel loops as well has a loculated fluid collection with enhancing margins in the pelvis/cul de sac 8.8 x 1.5 x 3.5 cm in size question peritonitis with pelvic abscess. Multiple additional small interloop collections of fluid are seen along bowel loops without the similar degree of enhancement or demarcation. Cholelithiasis. Electronically Signed   By: Lavonia Dana M.D.   On: 02/05/2015 15:59   Nm Myocar Multi W/spect W/wall Motion / Ef  02/13/2015  CLINICAL DATA:  Ventricular tachyarrhythmia EXAM: MYOCARDIAL IMAGING WITH SPECT (REST AND PHARMACOLOGIC-STRESS) GATED LEFT VENTRICULAR WALL MOTION STUDY LEFT VENTRICULAR EJECTION FRACTION TECHNIQUE: Standard myocardial  SPECT imaging was performed after resting intravenous injection of 10 mCi Tc-38m sestamibi. Subsequently, intravenous infusion of Lexiscan was performed under the supervision of the Cardiology staff. At peak effect of the drug, 30 mCi Tc-68m sestamibi was injected intravenously and standard myocardial SPECT imaging was performed. Quantitative gated imaging was also performed to evaluate left ventricular wall motion, and estimate left ventricular ejection fraction. COMPARISON:  None. FINDINGS: Perfusion: There is decreased activity noted in the lateral wall on stress images relative to rest images concerning for spleen. Wall Motion: Normal left ventricular wall motion. No left ventricular dilation. Left Ventricular Ejection Fraction: 67 % End diastolic volume 51 ml End systolic volume 17 ml IMPRESSION: 1. Findings concerning for inducible ischemia in the lateral wall. 2. Normal left ventricular wall motion. 3. Left ventricular ejection fraction 67% 4. High-risk stress test findings*. *2012 Appropriate Use Criteria for Coronary Revascularization Focused Update: J Am Coll Cardiol. 1191;47(8):295-621. http://content.[email or web address].aspx?[email or web address] Electronically Signed   By: Rolm Baptise M.D.   On: 02/13/2015 14:32   Dg Chest Port 1 View  02/08/2015  CLINICAL DATA:  Acute respiratory failure. EXAM: PORTABLE CHEST 1 VIEW COMPARISON:  02/07/2015 FINDINGS: A right PICC remains in place with tip projecting over the lower SVC. Cardiomediastinal silhouette is unchanged. Diffusely increased interstitial markings are stable to slightly increased compared to the prior study with more patchy opacities remaining in both lung bases. Small bilateral pleural effusions are not excluded. No pneumothorax  is seen. IMPRESSION: 1. Diffuse interstitial prominence which may reflect a combination of underlying COPD and superimposed edema. 2. Patchy bibasilar opacities suggestive of atelectasis with small pleural effusions  not excluded. Electronically Signed   By: Logan Bores M.D.   On: 02/08/2015 07:45   Dg Chest Port 1 View  02/07/2015  CLINICAL DATA:  Productive cough. EXAM: PORTABLE CHEST 1 VIEW COMPARISON:  February 06, 2015. FINDINGS: Stable cardiomediastinal silhouette. No pneumothorax is noted. Right-sided PICC line is noted with tip in expected position of the SVC. Mild central pulmonary vascular congestion is noted with probable bilateral pulmonary edema. Stable left basilar opacity is noted consistent with atelectasis and effusion. Bony thorax is unremarkable. Nasogastric tube has been removed. IMPRESSION: Mild central pulmonary vascular congestion is noted with possible bilateral perihilar edema. Stable left basilar opacity is noted concerning for atelectasis and effusion. Electronically Signed   By: Marijo Conception, M.D.   On: 02/07/2015 10:17   Dg Chest Port 1 View  02/06/2015  CLINICAL DATA:  Respiratory failure EXAM: PORTABLE CHEST 1 VIEW COMPARISON:  February 05, 2015 and February 01, 2015; chest CT Aug 24, 2014 FINDINGS: Central catheter tip is in the superior vena cava. Nasogastric tube tip extends into the stomach. No pneumothorax. There is airspace consolidation in the left base with small left effusion, stable. There is a minimal right effusion. The interstitium is diffusely prominent, likely with a mild degree of interstitial edema. There is no new parenchymal lung opacity. Heart is upper normal in size with pulmonary vascularity within normal limits. Atherosclerotic changes noted in aorta. No adenopathy is appreciable. IMPRESSION: Tube positions as described without pneumothorax. Persistent left lower lobe consolidation. Small pleural effusions bilaterally with mild interstitial edema. Suspect a degree of underlying congestive heart failure. The lungs and cardiac silhouette appears stable compared to 1 day prior. Electronically Signed   By: Lowella Grip III M.D.   On: 02/06/2015 07:20   Dg Chest  Port 1 View  02/05/2015  CLINICAL DATA:  Acute respiratory failure EXAM: PORTABLE CHEST - 1 VIEW COMPARISON:  02/03/2015 FINDINGS: Right-sided PICC line and nasogastric catheter are again noted in satisfactory position. The endotracheal tube is been removed in the interval. Poor inspiratory effort is again noted with persistent left basilar opacification. Increasing right basilar atelectasis is noted. IMPRESSION: Poor inspiratory effort of with persistent left basilar changes and new right basilar atelectasis. Electronically Signed   By: Inez Catalina M.D.   On: 02/05/2015 10:05   Dg Chest Port 1 View  02/03/2015  CLINICAL DATA:  Acute respiratory failure with hypoxia. EXAM: PORTABLE CHEST 1 VIEW COMPARISON:  02/02/2015 FINDINGS: Endotracheal tube with tip 4.1 cm above the carina. Nasogastric tube unchanged. Right-sided PICC line has tip over the right atrium. Lungs are hypoinflated demonstrate persistent opacification over the left base/ retrocardiac region obscuration of the hemidiaphragm. Stable mild bilateral interstitial prominence. Stable cardiomegaly. Remainder of the exam is unchanged. IMPRESSION: Stable mild bilateral interstitial prominence with stable left base opacification likely effusions/atelectasis although cannot exclude infection. Stable cardiomegaly. Tubes and lines as described. Right-sided PICC line has tip over the right atrium and could be pulled back approximately 5 cm. These results were called by telephone at the time of interpretation on 02/03/2015 at 9:04 am to patient's nurse, Wardell Honour, who verbally acknowledged these results. Electronically Signed   By: Marin Olp M.D.   On: 02/03/2015 09:04   Portable Chest Xray  02/02/2015  CLINICAL DATA:  Respiratory failure. EXAM: PORTABLE CHEST 1  VIEW COMPARISON:  02/01/2015 . FINDINGS: Endotracheal tube and NG tube in stable position. Right PICC line stable position. Cardiomegaly. Mild pulmonary interstitial prominence. Small  bilateral pleural effusions. Low lung volumes with basilar atelectasis. No pneumothorax. IMPRESSION: 1. Lines and tubes in stable position . 2. Cardiomegaly of mild from interstitial prominence and small bilateral pleural effusions. A component congestive heart failure cannot be excluded. 3. Low lung volumes with persistent bibasilar atelectasis. Electronically Signed   By: [place]   On: 02/02/2015 07:15   Dg Chest Port 1 View  02/01/2015  CLINICAL DATA:  PICC line placement. History of atrial fibrillation, COPD, leukocytosis and smoking. EXAM: PORTABLE CHEST 1 VIEW COMPARISON:  02/01/2015 and 01/31/2015. FINDINGS: 1506 hours. Patient is rotated to the left. The endotracheal and nasogastric tubes appear unchanged. There is new right arm PICC with its tip inferior to the left mainstem bronchus, at the level of the lower SVC allowing for rotation. No evidence of pneumothorax. Bilateral pleural effusions and bibasilar airspace opacities are unchanged. IMPRESSION: PICC line tip at the lower SVC level. No pneumothorax or other significant changes. Electronically Signed   By: Richardean Sale M.D.   On: 02/01/2015 15:33   Dg Chest Port 1 View  02/01/2015  CLINICAL DATA:  Pericardial effusion. EXAM: PORTABLE CHEST 1 VIEW COMPARISON:  01/31/2015.  CT 08/24/2014 . FINDINGS: Endotracheal tube and NG tube in stable position. Stable cardiomegaly. Prior CT revealed a small pericardial effusion. No pulmonary venous congestion. Diffuse bilateral pulmonary interstitial prominence. Previously identified density in the left AP window has resolved, this may have represented atelectasis. Small bilateral pleural effusions. No pneumothorax. IMPRESSION: 1. Lines and tubes in stable position. 2. Diffuse bilateral pulmonary interstitial prominence unchanged. Interstitial edema and/or pneumonitis could present in this fashion. Small bilateral pleural effusions. 3. Previously identified density in the AP window has resolved.  This may have represented atelectasis. Electronically Signed   By: [place]   On: 02/01/2015 07:34   Dg Chest Port 1 View  01/31/2015  CLINICAL DATA:  Endotracheal tube placement. EXAM: PORTABLE CHEST 1 VIEW COMPARISON:  01/31/2015 at 1016 hours. FINDINGS: Endotracheal tube terminates approximately 2.9 cm above the carina. Nasogastric tube is followed into the stomach with the side port in the region of the gastroesophageal junction. There is prominence of the AP window and left hilum. Consolidation in the lingula obscures the left heart border. There is interstitial prominence in the right hemi thorax. IMPRESSION: 1. Endotracheal tube is in satisfactory position. 2. Prominence of the AP window and left hilum, of uncertain etiology. New consolidation in the lingula. Consider further evaluation with CT chest with contrast, as clinically indicated. 3. Progressive interstitial prominence in the right lung, possibly due to edema. Electronically Signed   By: Lorin Picket M.D.   On: 01/31/2015 20:55   US Breast Ltd Uni Left Inc Axilla  01/26/2015  CLINICAL DATA:  69 year old female presenting for delayed six-month follow-up of a probably benign left breast mass. EXAM: DIGITAL DIAGNOSTIC BILATERAL MAMMOGRAM WITH 3D TOMOSYNTHESIS AND CAD LEFT BREAST ULTRASOUND COMPARISON:  Previous exam(s). ACR Breast Density Category b: There are scattered areas of fibroglandular density. FINDINGS: In the lower-inner quadrant of the left breast, there is a persistent oval mass measuring 5 mm on the MLO view. No new masses, areas of distortion or suspicious calcifications are seen in the breasts bilaterally. Mammographic images were processed with CAD. Physical exam targeted to the lower-inner quadrant of the left breast demonstrates no discrete palpable abnormality. Ultrasound targeted  to the lower-inner quadrant of the left breast at 7 o'clock, 2 cm from the nipple demonstrates a hypoechoic superficial oval mass with  indistinct margins measuring 9 x 3 x 4 mm. Previously, the mass measured 5 x 3 x 4 mm. Internal blood flow is documented within the mass. IMPRESSION: 1. Interval increase in size of the superficial mass at 7 o'clock. This may represent a papilloma or possibly a fibroadenoma. 2.  No evidence of malignancy in the right breast. RECOMMENDATION: Ultrasound-guided biopsy is recommended for the left breast mass at 7 o'clock which has increased in size. The patient states that she will need to call back for an appointment, and prefers to come in mid November. I have discussed the findings and recommendations with the patient. Results were also provided in writing at the conclusion of the visit. If applicable, a reminder letter will be sent to the patient regarding the next appointment. BI-RADS CATEGORY  4: Suspicious. Electronically Signed   By: Ammie Ferrier M.D.   On: 01/26/2015 15:01   Mm Diag Breast Tomo Bilateral  01/26/2015  CLINICAL DATA:  69 year old female presenting for delayed six-month follow-up of a probably benign left breast mass. EXAM: DIGITAL DIAGNOSTIC BILATERAL MAMMOGRAM WITH 3D TOMOSYNTHESIS AND CAD LEFT BREAST ULTRASOUND COMPARISON:  Previous exam(s). ACR Breast Density Category b: There are scattered areas of fibroglandular density. FINDINGS: In the lower-inner quadrant of the left breast, there is a persistent oval mass measuring 5 mm on the MLO view. No new masses, areas of distortion or suspicious calcifications are seen in the breasts bilaterally. Mammographic images were processed with CAD. Physical exam targeted to the lower-inner quadrant of the left breast demonstrates no discrete palpable abnormality. Ultrasound targeted to the lower-inner quadrant of the left breast at 7 o'clock, 2 cm from the nipple demonstrates a hypoechoic superficial oval mass with indistinct margins measuring 9 x 3 x 4 mm. Previously, the mass measured 5 x 3 x 4 mm. Internal blood flow is documented within the  mass. IMPRESSION: 1. Interval increase in size of the superficial mass at 7 o'clock. This may represent a papilloma or possibly a fibroadenoma. 2.  No evidence of malignancy in the right breast. RECOMMENDATION: Ultrasound-guided biopsy is recommended for the left breast mass at 7 o'clock which has increased in size. The patient states that she will need to call back for an appointment, and prefers to come in mid November. I have discussed the findings and recommendations with the patient. Results were also provided in writing at the conclusion of the visit. If applicable, a reminder letter will be sent to the patient regarding the next appointment. BI-RADS CATEGORY  4: Suspicious. Electronically Signed   By: Ammie Ferrier M.D.   On: 01/26/2015 15:01    Assessment/Plan  Principal Problem:  Ruptured appendix Active Problems:  Perforated appendicitis  Hypoglycemia  Acute respiratory failure with hypoxemia  Sepsis ([place])  Acute respiratory failure with hypoxemia ([facility])  Atrial fibrillation ([facility])  Acute diastolic heart failure ([facility])  Wide-complex tachycardia ([place])   1. Abnormal NST: Nuclear stress test with ischemia in the lateral wall. She has a long history of tobacco use, dyslipidemia, DM and now with nonsustained ventricular tachycardia on telemetry.  For cath today.  Continue statin/BB/ASA.  2. Acute ruptured appendix with forming pelvic abscess: s/p laparoscopic appendectomy - per surgery  3. Acute diastolic CHF secondary to acute illness: - appears euvolemic on exam after IV lasix. Now on PO lasix.   4. Mild to moderate pericardial  effusion: ? Etiology - repeat echo in 2 weeks to ensure this is resolving.  5. Post-operative Atrial Fibrillation: with no further reoccurrence. She has not had any history of PAF in the past and this was in the setting of acute illness. Would not recommend long term anticoagulation at this time unless she has further episodes. This  patients CHA2DS2-VASc Score and unadjusted Ischemic Stroke Rate (% per year) is equal to 3.2 % stroke rate/year from a score of 3.  6. Hypokalemia/Hypomagnesemia - repleted per IM  7. NSVT: no further recurrence on telemetry. This occurred in the setting of hypokalemia, which has been repleated. However with high risk NST, will also need to r/o coronary ischemia.   8. Tobacco Abuse: 30+ year history. Smoking cessation strongly encouraged.    Sueanne Margarita, MD  02/15/2015  10:21 AM

## 2015-02-15 NOTE — Progress Notes (Signed)
OT Cancellation Note  Patient Details Name: Mercedes Thornton MRN: [id number] DOB: Sep 12, 1945   Cancelled Treatment:    Reason Eval/Treat Not Completed: Patient at procedure or test/ unavailable  Tyri Elmore 02/15/2015, 12:48 PM  Lesle Chris, OTR/L [phone] 02/15/2015

## 2015-02-15 NOTE — Progress Notes (Signed)
Cath results reviewed and showed 50% RCA.  OK from cardiac standpoint for discharge.  Continue ASA/BB and statin. Would stop Lasix as LVEDP was normal and actually low (62mmHg) and diastolic CHF was in setting of acute illness. She will need followup with Cardiology in 2 months with me and echo to followup on mild to moderate pericardial effusion in 2 weeks.  She also will need outpt fasting lipid panel.  We will arranage this.  Will sign off.  Call with any question.

## 2015-02-15 NOTE — Interval H&P Note (Signed)
History and Physical Interval Note:  02/15/2015 2:19 PM  Mercedes Thornton  has presented today for surgery, with the diagnosis of ABNORMAL NUCLEAR STRESS TEST. The various methods of treatment have been discussed with the patient and family. After consideration of risks, benefits and other options for treatment, the patient has consented to  Procedure(s): Left Heart Cath and Coronary Angiography (N/A) with possible Percutaneous Coronary Intervention  as a surgical intervention .  The patient's history has been reviewed, patient examined, no change in status, stable for surgery.  I have reviewed the patient's chart and labs.  Questions were answered to the patient's satisfaction.     [place], [place]  Cath Lab Visit (complete for each Cath Lab visit)  Clinical Evaluation Leading to the Procedure:   ACS: Yes. Unclear = peri-operative Sepsis with Acute Resp Failure & + Troponin  Non-ACS:    Anginal Classification: No Symptoms  Anti-ischemic medical therapy: Minimal Therapy (1 class of medications)  Non-Invasive Test Results: High-risk stress test findings: cardiac mortality >3%/year  Prior CABG: No previous CABG   TIMI SCORE  Patient Information:  TIMI Score is 4  UA/NSTEMI and intermediate-risk features (e.g., TIMI score 3?4) for short-term risk of death or nonfatal MI  Revascularization of the presumed culprit artery   A (8)  Indication: 10; Score: 8  HARDING, Leonie Green, M.D., M.S. Interventional Cardiologist   Pager # [phone]

## 2015-02-15 NOTE — Progress Notes (Signed)
Pt received from [facility] via [facility], placed on monitor, vss, denies CP or SOB, resting comfortably.

## 2015-02-15 NOTE — Progress Notes (Signed)
PT Cancellation Note  Patient Details Name: Mercedes Thornton MRN: [id number] DOB: [birth year]   Cancelled Treatment:    Reason Eval/Treat Not Completed: Patient at procedure or test/unavailable   Jomel Whittlesey,KATHrine E 02/15/2015, 1:02 PM Carmelia Bake, PT, DPT 02/15/2015 Pager: [phone]

## 2015-02-15 NOTE — Progress Notes (Addendum)
TR BAND REMOVAL  LOCATION:   Rightradial  DEFLATED PER PROTOCOL:    Yes.    TIME BAND OFF / DRESSING APPLIED:    1745p   SITE UPON ARRIVAL:    Level 0  SITE AFTER BAND REMOVAL:    Level 0  REVERSE ALLEN'S TEST:     positive  CIRCULATION SENSATION AND MOVEMENT:    Within Normal Limits   Yes.    COMMENTS:   Right wrist level 0 . Pt given instruction on radial care, and verbalize understanding.     VS remain stable.  No complaints of discomfort at this time. Pt transported back to [facility] via [facility].

## 2015-02-15 NOTE — Progress Notes (Signed)
[facility] PROGRESS NOTE  Teona Vargus VFI:[id number] DOB: [birth year] DOA: 01/31/2015 PCP: Antonietta Jewel, MD    Interim Summary  [name] is a 69 year old female with a past medical history of chronic objective pulmonary disease, diabetes mellitus, dyslipidemia, admitted on 01/31/2015 presenting with nausea/vomiting/abdominal pain with CT scan of abdomen and pelvis revealing a ruptured appendicitis with developing pelvic abscess. She was taken to the operating room on 01/31/2015 where she underwent laparoscopic appendectomy and evacuation of pelvic abscesses. Procedure was performed by Dr.Newman of general surgery. Postoperatively she went into acute hypoxemic respiratory failure and required ventilatory support. She had sepsis with hypotension requiring IV pressor support. She was treated with broad-spectrum IV and microbial therapy with Primaxin, ciprofloxacin and IV vancomycin. She showed gradual clinical improvement and was extubated on 02/04/2015.          Patient was transferred to [facility], with continued diuresis, abx, she developed NSVTx2, with post op afib/ chf and pericardioeffusion ,cardiology consulted, stress test high risk, she is to [facility] on [facility], if no need of stenting, will come back to [facility], if need stenting, [facility] will [facility] and [facility], she will be discharged with home health (may need home oxygen), she will live with her daughter in [place] once discharged.  Her surgical drain was discontinued on 11/2, abx stopped on 11/2, she will need to follow up with general surgery Dr. Lucia Gaskins.                    Assessment/Plan: 1. Acute hypoxemic respiratory failure. -Postoperatively patient requiring ventilator support. Extubated on 02/04/2015.  -Could be secondary to volume overload with imaging studies revealing acute pulmonary edema. Patient also having a history of structural lung disease in setting of chronic obstructive  lung disease. -Repeat chest x-ray performed on 02/08/2015 showing diffuse interstitial prominence which could reflect combination of COPD and edema. Atelectasis. -received iv lasix, then oral lasix, repeat cxr on 11/2 with improvement, but persistent bibasilar atelectasis, bronchitic changes and minimal persistent interstitial prominence remains,  Continue lasix, continue incentive spirometer, wean oxygen. Need home o2?  2.  Ruptured appendicitis -Patient undergoing laparoscopic appendectomy with evacuation of pelvic abscess on 01/31/2015, procedure performed by Dr. Lucia Gaskins of general surgery. -Postoperative course, complicated by sepsis, she remains on antibiotic therapy with Primaxin 500 mg IV every 6 hours. Wbc trending down ( h/o chronic leukocytosis) -Surgery following, surgery recommended stop abx on 11/2, hold on repeat imaging due to clinically improving, surgical drain removed on 11/2, she will follow up with Dr. Lucia Gaskins outpatient.   3.  Sepsis -Present on admission, source of infection likely ruptured appendix. Sepsis evidenced by the presence of shock, requiring IV pressor support, acute respiratory failure requiring ventilator support. -Blood cultures obtained on 01/31/2015 at 02/03/2015 showing no growth -finished treatment with  Primaxin, last dose on 11/2. -sepsis resolved,  4. Acute Diatolic CHF/pulmonary edema -TTE performed on 02/02/2015 showing preserved EF  - s/p IV lasix -now Lasix 40 mg PO BID. No edema, on 2liter oxygen, continue wean oxygen  5.  Pericardial effusion. -Patient having a moderate pericardial effusion seen on transthoracic echocardiogram from 02/02/2015. Echo did not reveal evidence of hemodynamic compromise -Repeat transthoracic echocardiogram performed on 02/09/2015 showing small to moderate pleural effusion without evidence of hemodynamic compromise.  6.  A. Fib (paroxysimal , post op) -Patient's hospital course, complicated by A. fib RVR requiring  amiodarone drip -She converted to sinus rhythm, amiodarone drip discontinued, was on  Cardizem 120 mg, now changed to metoprolol per cardiology recommendation. No need of anticoagulation per cardiology. -cardiology consulted.  7.  Hypokalemia -Hypokalemia likely secondary to diuresis, replace prn to keep K >4.  8. Hypomagnesemia -Replace prn to keep mag >2.  9. NSVT, keep mag>2, k >4, cardiology consulted.  10: high risk stress test: denies chest pain, on asa/statin/betablocker, cardiac cath 11/3.   11. noninsulin dependent diabetes, on metformin/amaryl PTA, a1c 7.5, on ssi here since admission, start levemir due to persistent elevated am blood glucose. May need to be discharged on insulin.   12. smoking cessation education provided.   Code Status: Full code Family Communication: patient Disposition Plan: d/c home with daughter in [place] and home health in 1-2 days if medically stable   Consultants:  Pulmonary critical care medicine  General surgery  cardiology  Procedures:  Laparoscopic appendectomy procedure performed on 01/31/2015  picc line (right)  Antibiotics:  Permaxin stopped on 11/2  HPI/Subjective:  feeling better, denies chest pain, no abdominal pain, reported stool start to form, no blood. Tolerating diet, Surgical drain removed, On 2liter oxygen supplement.   Objective: Filed Vitals:   02/15/15 1041  BP: 118/59  Pulse: 67  Temp:   Resp:     Intake/Output Summary (Last 24 hours) at 02/15/15 1127 Last data filed at 02/15/15 1003  Gross per 24 hour  Intake  159.5 ml  Output   1505 ml  Net -1345.5 ml   Filed Weights   02/13/15 0627 02/14/15 0529 02/15/15 0406  Weight: 165 lb 9.1 oz (75.1 kg) 165 lb 2 oz (74.9 kg) 162 lb 1.6 oz (73.528 kg)    Exam:   General:  NAD, but frail  Cardiovascular: Regular rate rhythm normal S1-S2  Respiratory: diminished, no rales, no rhonchi, no wheezing  Abdomen: Surgical incision sites appears clean,  abd soft nontender nondistended, RLQ drain removed, dressing intact.  Musculoskeletal: trace edema to lower extremities has resolved.   Data Reviewed: Basic Metabolic Panel:  Recent Labs Lab 02/09/15 0600  02/11/15 0400 02/12/15 0700 02/12/15 1546 02/13/15 0435 02/14/15 0545 02/15/15 0600  NA 146*  < > 142 140  --  141 140 138  K 3.3*  < > 3.7 3.3*  --  3.3* 3.4* 4.9  CL 103  < > 103 102  --  102 103 101  CO2 35*  < > 32 31  --  31 30 30   GLUCOSE 153*  < > 156* 217*  --  227* 181* 216*  BUN 7  < > 12 13  --  11 8 8   CREATININE 0.76  < > 0.76 0.70  --  0.67 0.61 0.76  CALCIUM 8.1*  < > 8.4* 8.6*  --  8.3* 8.4* 8.4*  MG 1.5*  --   --   --  1.8 1.6* 1.7 1.9  < > = values in this interval not displayed. Liver Function Tests:  Recent Labs Lab 02/09/15 0600 02/13/15 0435  AST 19 31  ALT 12* 17  ALKPHOS 69 81  BILITOT 0.4 0.6  PROT 6.5 6.9  ALBUMIN 2.1* 2.3*   No results for input(s): LIPASE, AMYLASE in the last 168 hours. No results for input(s): AMMONIA in the last 168 hours. CBC:  Recent Labs Lab 02/11/15 0400 02/12/15 0700 02/13/15 0435 02/14/15 0545 02/15/15 0600  WBC 16.5* 12.6* 10.9* 11.1* 10.2  NEUTROABS  --   --  7.4  --   --   HGB 10.5* 10.8* 10.6* 11.1* 11.3*  HCT 33.1* 33.7* 33.8* 34.3* 35.3*  MCV 89.9 88.9 89.9 88.6 88.9  PLT 729* 686* 667* 614* 592*   Cardiac Enzymes: No results for input(s): CKTOTAL, CKMB, CKMBINDEX, TROPONINI in the last 168 hours. BNP (last 3 results)  Recent Labs  02/01/15 1530  BNP 487.5*    ProBNP (last 3 results) No results for input(s): PROBNP in the last 8760 hours.  CBG:  Recent Labs Lab 02/14/15 0725 02/14/15 1204 02/14/15 1637 02/14/15 2028 02/15/15 0734  GLUCAP 152* 184* 162* 171* 209*    Recent Results (from the past 240 hour(s))  C difficile quick scan w PCR reflex     Status: None   Collection Time: 02/06/15 10:41 PM  Result Value Ref Range Status   C Diff antigen NEGATIVE NEGATIVE Final   C  Diff toxin NEGATIVE NEGATIVE Final   C Diff interpretation Negative for toxigenic C. difficile  Final     Studies: Dg Chest 2 View  02/14/2015  CLINICAL DATA:  Shortness of breath, history COPD, smoking, diabetes mellitus EXAM: CHEST  2 VIEW COMPARISON:  02/09/2015 FINDINGS: RIGHT arm PICC line tip projects over SVC near cavoatrial junction. Enlargement of cardiac silhouette. Atherosclerotic calcification aorta. Pulmonary vascularity normal. Bronchitic changes with bibasilar atelectasis. Mild interstitial prominence in both lungs unchanged. Pleural thickening/scarring lateral RIGHT upper hemi thorax unchanged. No pleural effusion or pneumothorax. Bones demineralized. IMPRESSION: Enlargement of cardiac silhouette. Bronchitic changes with persistent bibasilar atelectasis. Minimal persistent interstitial prominence question residual infiltrate. Electronically Signed   By: Lavonia Dana M.D.   On: 02/14/2015 08:27   Nm Myocar Multi W/spect W/wall Motion / Ef  02/13/2015  CLINICAL DATA:  Ventricular tachyarrhythmia EXAM: MYOCARDIAL IMAGING WITH SPECT (REST AND PHARMACOLOGIC-STRESS) GATED LEFT VENTRICULAR WALL MOTION STUDY LEFT VENTRICULAR EJECTION FRACTION TECHNIQUE: Standard myocardial SPECT imaging was performed after resting intravenous injection of 10 mCi Tc-36m sestamibi. Subsequently, intravenous infusion of Lexiscan was performed under the supervision of the Cardiology staff. At peak effect of the drug, 30 mCi Tc-52m sestamibi was injected intravenously and standard myocardial SPECT imaging was performed. Quantitative gated imaging was also performed to evaluate left ventricular wall motion, and estimate left ventricular ejection fraction. COMPARISON:  None. FINDINGS: Perfusion: There is decreased activity noted in the lateral wall on stress images relative to rest images concerning for spleen. Wall Motion: Normal left ventricular wall motion. No left ventricular dilation. Left Ventricular Ejection  Fraction: 67 % End diastolic volume 51 ml End systolic volume 17 ml IMPRESSION: 1. Findings concerning for inducible ischemia in the lateral wall. 2. Normal left ventricular wall motion. 3. Left ventricular ejection fraction 67% 4. High-risk stress test findings*. *2012 Appropriate Use Criteria for Coronary Revascularization Focused Update: J Am Coll Cardiol. 9371;69(6):789-381. http://content.airportbarriers.com.aspx?articleid=[email or web address] Electronically Signed   By: Rolm Baptise M.D.   On: 02/13/2015 14:32    Scheduled Meds: . antiseptic oral rinse  7 mL Mouth Rinse QID  . aspirin  81 mg Oral Pre-Cath  . aspirin EC  81 mg Oral Daily  . feeding supplement (ENSURE ENLIVE)  237 mL Oral q morning - 10a  . furosemide  40 mg Oral BID  . heparin subcutaneous  5,000 Units Subcutaneous 3 times per day  . insulin aspart  0-15 Units Subcutaneous TID WC  . insulin aspart  0-5 Units Subcutaneous QHS  . insulin detemir  8 Units Subcutaneous QHS  . metoprolol tartrate  25 mg Oral BID  . saccharomyces boulardii  250 mg Oral BID  . sodium chloride  10-40 mL  Intracatheter Q12H  . sodium chloride  3 mL Intravenous Q12H   Continuous Infusions: . sodium chloride 10 mL/hr at 02/15/15 0503    Principal Problem:   Ruptured appendix Active Problems:   Perforated appendicitis   Hypoglycemia   Acute respiratory failure with hypoxemia   Sepsis (HCC)   Acute respiratory failure with hypoxemia (HCC)   Atrial fibrillation (HCC)   Acute diastolic heart failure (HCC)   Wide-complex tachycardia (HCC)   Abnormal nuclear stress test   Ventricular tachyarrhythmia ([place])    Time spent: 74 min   Leniya Breit MD PhD  [facility] Pager [phone]. If 7PM-7AM, please contact night-coverage at [email or web address], password [facility] 02/15/2015, 11:27 AM  LOS: 15 days

## 2015-02-15 NOTE — Progress Notes (Addendum)
Patient back from cath lab. R wrist covered in gauze dressing that is clean.dry and intact. Patient denies pain. Will continue to monitor.

## 2015-02-15 NOTE — H&P (View-Only) (Signed)
SUBJECTIVE:  No complaints  OBJECTIVE:   Vitals:   Filed Vitals:   02/14/15 1211 02/14/15 1327 02/14/15 2023 02/15/15 0406  BP: 122/70 141/82 127/56 118/56  Pulse: 74 80 72 65  Temp:  97.9 F (36.6 C) 98.1 F (36.7 C) 98.2 F (36.8 C)  TempSrc:  Oral Oral Oral  Resp:   20 20  Height:      Weight:    162 lb 1.6 oz (73.528 kg)  SpO2:  95% 91% 93%   I&O's:   Intake/Output Summary (Last 24 hours) at 02/15/15 1021 Last data filed at 02/15/15 1003  Gross per 24 hour  Intake  159.5 ml  Output   1505 ml  Net -1345.5 ml   TELEMETRY: Reviewed telemetry pt in NSR:     PHYSICAL EXAM General: Well developed, well nourished, in no acute distress Head: Eyes PERRLA, No xanthomas.   Normal cephalic and atramatic  Lungs:   Clear bilaterally to auscultation and percussion. Heart:   HRRR S1 S2 Pulses are 2+ & equal. Abdomen: Bowel sounds are positive, abdomen soft and non-tender without masses  Extremities:   No clubbing, cyanosis or edema.  DP +1 Neuro: Alert and oriented X 3. Psych:  Good affect, responds appropriately   LABS: Basic Metabolic Panel:  Recent Labs  02/14/15 0545 02/15/15 0600  NA 140 138  K 3.4* 4.9  CL 103 101  CO2 30 30  GLUCOSE 181* 216*  BUN 8 8  CREATININE 0.61 0.76  CALCIUM 8.4* 8.4*  MG 1.7 1.9   Liver Function Tests:  Recent Labs  02/13/15 0435  AST 31  ALT 17  ALKPHOS 81  BILITOT 0.6  PROT 6.9  ALBUMIN 2.3*   No results for input(s): LIPASE, AMYLASE in the last 72 hours. CBC:  Recent Labs  02/13/15 0435 02/14/15 0545 02/15/15 0600  WBC 10.9* 11.1* 10.2  NEUTROABS 7.4  --   --   HGB 10.6* 11.1* 11.3*  HCT 33.8* 34.3* 35.3*  MCV 89.9 88.6 88.9  PLT 667* 614* 592*   Cardiac Enzymes: No results for input(s): CKTOTAL, CKMB, CKMBINDEX, TROPONINI in the last 72 hours. BNP: Invalid input(s): POCBNP D-Dimer: No results for input(s): DDIMER in the last 72 hours. Hemoglobin A1C: No results for input(s): HGBA1C in the last 72  hours. Fasting Lipid Panel: No results for input(s): CHOL, HDL, LDLCALC, TRIG, CHOLHDL, LDLDIRECT in the last 72 hours. Thyroid Function Tests:  Recent Labs  02/13/15 0435  TSH 4.524*   Anemia Panel: No results for input(s): VITAMINB12, FOLATE, FERRITIN, TIBC, IRON, RETICCTPCT in the last 72 hours. Coag Panel:   Lab Results  Component Value Date   INR 1.06 02/15/2015   INR 0.92 11/11/2011    RADIOLOGY: Ct Abdomen Pelvis Wo Contrast  01/31/2015  CLINICAL DATA:  Abdominal distension. Abdominal pain. Weakness. Leukocytosis. EXAM: CT ABDOMEN AND PELVIS WITHOUT CONTRAST TECHNIQUE: Multidetector CT imaging of the abdomen and pelvis was performed following the standard protocol without IV contrast. COMPARISON:  Chest CT 08/24/2014 FINDINGS: Subpleural opacities in the right lower lobe likely reflect dependent subsegmental atelectasis and trace pleural fluid or thickening. There is minimal atelectasis or scarring in the left lung base. Coronary artery calcification is noted. Pericardial effusion measures up to 1.5 cm in thickness and overall appears similar in volume to the prior chest CT although with a greater amount of the fluid located posteriorly on the current examination. Small gallstones are noted. No biliary dilatation is seen. The liver, kidneys, and pancreas  have an unremarkable unenhanced appearance. A subcentimeter calcification in the spleen is unchanged, as is minimal bilateral adrenal gland thickening. There is the suggestion of mild distal esophageal wall thickening with a small amount of fluid or debris present, incompletely visualized. The appendix is identified in the right lower and central abdomen and is mildly distended and thick walled diffusely, measuring up to 13 mm in diameter. There is mild surrounding inflammatory stranding, and there is a small locule of extraluminal gas immediately inferior to the proximal appendix (series 2, image 69). More inferiorly in the right adnexa  is a 4.2 x 3.6 cm gas and fluid collection. There is additional fluid and gas in the left pelvis which appears less organized, measuring approximately 9.6 x 3.9 cm (series 2, image 78). There is moderate dilatation of multiple proximal and mid small bowel loops containing air-fluid levels and measuring up to 5.6 cm in diameter. Gas and fluid are also present an nondilated colon. There is mild sigmoid colon wall thickening which is favored to be secondary to adjacent pelvic inflammation. Bladder is unremarkable. Uterus is present. Extensive atherosclerotic vascular calcification is noted. Lower lumbar facet arthrosis is noted. Subcentimeter retroperitoneal lymph nodes are likely reactive. IMPRESSION: 1. Findings most compatible with ruptured appendicitis with likely developing pelvic abscesses. 2. Moderate small bowel dilatation favored to reflect ileus rather than mechanical obstruction. 3. Cholelithiasis. 4. Unchanged pericardial effusion. 5. Distal esophageal wall thickening, query esophagitis. These results were called by telephone at the time of interpretation on 01/31/2015 at 11:51 am to Dr. Tyrone Nine, who verbally acknowledged these results. Electronically Signed   By: Logan Bores M.D.   On: 01/31/2015 11:58   Dg Chest 2 View  02/14/2015  CLINICAL DATA:  Shortness of breath, history COPD, smoking, diabetes mellitus EXAM: CHEST  2 VIEW COMPARISON:  02/09/2015 FINDINGS: RIGHT arm PICC line tip projects over SVC near cavoatrial junction. Enlargement of cardiac silhouette. Atherosclerotic calcification aorta. Pulmonary vascularity normal. Bronchitic changes with bibasilar atelectasis. Mild interstitial prominence in both lungs unchanged. Pleural thickening/scarring lateral RIGHT upper hemi thorax unchanged. No pleural effusion or pneumothorax. Bones demineralized. IMPRESSION: Enlargement of cardiac silhouette. Bronchitic changes with persistent bibasilar atelectasis. Minimal persistent interstitial prominence  question residual infiltrate. Electronically Signed   By: Lavonia Dana M.D.   On: 02/14/2015 08:27   Dg Chest 2 View  02/09/2015  CLINICAL DATA:  Short of breath EXAM: CHEST  2 VIEW COMPARISON:  02/08/2015 FINDINGS: Mild cardiomegaly. Small bilateral effusions are not significantly changed. Diffuse interstitial lung disease is improved. No pneumothorax. Stable right PICC. IMPRESSION: Stable small pleural effusions. Diffuse interstitial disease is improved likely due to improving edema. Electronically Signed   By: Marybelle Killings M.D.   On: 02/09/2015 11:51   Dg Chest 2 View  01/31/2015  CLINICAL DATA:  Cough and congestion with increased weakness over the past 5 days. EXAM: CHEST  2 VIEW COMPARISON:  Chest CT 08/24/2014 FINDINGS: Cardiopericardial enlargement appears increased compared to scanogram from previous study, when pericardial effusion was present, but there is rotation to the left. Stable aortic and hilar contours. Poorly defined left diaphragm, likely from cardiac contact, with no pleural effusion seen on lateral imaging. Mild interstitial coarsening correlating with emphysema on previous CT. There is no edema, consolidation, or pneumothorax. IMPRESSION: 1. Cardiopericardial enlargement with apparent increase from chest CT 08/24/2014 potentially due to rotation. Given a pericardial effusion was present on the comparison CT, consider echocardiogram follow-up. 2. Emphysema without superimposed pneumonia or edema. Electronically Signed   By:  Monte Fantasia M.D.   On: 01/31/2015 10:33   Ct Abdomen Pelvis W Contrast  02/05/2015  CLINICAL DATA:  Leukocytosis, post appendectomy and lysis of adhesions on 01/31/2015, underlying COPD, diabetes mellitus, smoker EXAM: CT ABDOMEN AND PELVIS WITH CONTRAST TECHNIQUE: Multidetector CT imaging of the abdomen and pelvis was performed using the standard protocol following bolus administration of intravenous contrast. Sagittal and coronal MPR images reconstructed  from axial data set. CONTRAST:  187mL OMNIPAQUE IOHEXOL 300 MG/ML SOLN IV. Oral contrast was not administered. COMPARISON:  01/31/2015 FINDINGS: Bibasilar atelectasis, difficult to exclude mild consolidation in lower lobes. Small pericardial effusion. Liver, spleen, pancreas, pancreas, and adrenal glands normal appearance. Dependent gallstone in gallbladder. Nasogastric tube in stomach. Surgical drain in pelvis and RIGHT lower quadrant. Few small bowel loops superior mildly distended without definite evidence of obstruction. Mild bowel wall thickening of small bowel loops in pelvis with scattered ascites and areas of enhancement question peritonitis Focal cul-de-sac fluid collection measures up to 8.8 x 1.5 x 3.5 cm in size question abscess. Numerous additional small interloop collections of fluid are identified which do not demonstrate the same degree of margination or enhancement Air and Foley catheter within urinary bladder. Unremarkable uterus and adnexa. Colon decompressed. No mass or adenopathy. Scattered atherosclerotic calcifications without aneurysm. No free air or hernia. Bones unremarkable. IMPRESSION: Post appendectomy with scattered inflammatory changes in the pelvis, which include bowel wall thickening and enhancement of multiple small bowel loops as well has a loculated fluid collection with enhancing margins in the pelvis/cul de sac 8.8 x 1.5 x 3.5 cm in size question peritonitis with pelvic abscess. Multiple additional small interloop collections of fluid are seen along bowel loops without the similar degree of enhancement or demarcation. Cholelithiasis. Electronically Signed   By: Lavonia Dana M.D.   On: 02/05/2015 15:59   Nm Myocar Multi W/spect W/wall Motion / Ef  02/13/2015  CLINICAL DATA:  Ventricular tachyarrhythmia EXAM: MYOCARDIAL IMAGING WITH SPECT (REST AND PHARMACOLOGIC-STRESS) GATED LEFT VENTRICULAR WALL MOTION STUDY LEFT VENTRICULAR EJECTION FRACTION TECHNIQUE: Standard myocardial  SPECT imaging was performed after resting intravenous injection of 10 mCi Tc-59m sestamibi. Subsequently, intravenous infusion of Lexiscan was performed under the supervision of the Cardiology staff. At peak effect of the drug, 30 mCi Tc-11m sestamibi was injected intravenously and standard myocardial SPECT imaging was performed. Quantitative gated imaging was also performed to evaluate left ventricular wall motion, and estimate left ventricular ejection fraction. COMPARISON:  None. FINDINGS: Perfusion: There is decreased activity noted in the lateral wall on stress images relative to rest images concerning for spleen. Wall Motion: Normal left ventricular wall motion. No left ventricular dilation. Left Ventricular Ejection Fraction: 67 % End diastolic volume 51 ml End systolic volume 17 ml IMPRESSION: 1. Findings concerning for inducible ischemia in the lateral wall. 2. Normal left ventricular wall motion. 3. Left ventricular ejection fraction 67% 4. High-risk stress test findings*. *2012 Appropriate Use Criteria for Coronary Revascularization Focused Update: J Am Coll Cardiol. 4742;59(5):638-756. http://content.airportbarriers.com.aspx?articleid=[email or web address] Electronically Signed   By: Rolm Baptise M.D.   On: 02/13/2015 14:32   Dg Chest Port 1 View  02/08/2015  CLINICAL DATA:  Acute respiratory failure. EXAM: PORTABLE CHEST 1 VIEW COMPARISON:  02/07/2015 FINDINGS: A right PICC remains in place with tip projecting over the lower SVC. Cardiomediastinal silhouette is unchanged. Diffusely increased interstitial markings are stable to slightly increased compared to the prior study with more patchy opacities remaining in both lung bases. Small bilateral pleural effusions are not excluded. No pneumothorax  is seen. IMPRESSION: 1. Diffuse interstitial prominence which may reflect a combination of underlying COPD and superimposed edema. 2. Patchy bibasilar opacities suggestive of atelectasis with small pleural effusions  not excluded. Electronically Signed   By: Logan Bores M.D.   On: 02/08/2015 07:45   Dg Chest Port 1 View  02/07/2015  CLINICAL DATA:  Productive cough. EXAM: PORTABLE CHEST 1 VIEW COMPARISON:  February 06, 2015. FINDINGS: Stable cardiomediastinal silhouette. No pneumothorax is noted. Right-sided PICC line is noted with tip in expected position of the SVC. Mild central pulmonary vascular congestion is noted with probable bilateral pulmonary edema. Stable left basilar opacity is noted consistent with atelectasis and effusion. Bony thorax is unremarkable. Nasogastric tube has been removed. IMPRESSION: Mild central pulmonary vascular congestion is noted with possible bilateral perihilar edema. Stable left basilar opacity is noted concerning for atelectasis and effusion. Electronically Signed   By: Marijo Conception, M.D.   On: 02/07/2015 10:17   Dg Chest Port 1 View  02/06/2015  CLINICAL DATA:  Respiratory failure EXAM: PORTABLE CHEST 1 VIEW COMPARISON:  February 05, 2015 and February 01, 2015; chest CT Aug 24, 2014 FINDINGS: Central catheter tip is in the superior vena cava. Nasogastric tube tip extends into the stomach. No pneumothorax. There is airspace consolidation in the left base with small left effusion, stable. There is a minimal right effusion. The interstitium is diffusely prominent, likely with a mild degree of interstitial edema. There is no new parenchymal lung opacity. Heart is upper normal in size with pulmonary vascularity within normal limits. Atherosclerotic changes noted in aorta. No adenopathy is appreciable. IMPRESSION: Tube positions as described without pneumothorax. Persistent left lower lobe consolidation. Small pleural effusions bilaterally with mild interstitial edema. Suspect a degree of underlying congestive heart failure. The lungs and cardiac silhouette appears stable compared to 1 day prior. Electronically Signed   By: Lowella Grip III M.D.   On: 02/06/2015 07:20   Dg Chest  Port 1 View  02/05/2015  CLINICAL DATA:  Acute respiratory failure EXAM: PORTABLE CHEST - 1 VIEW COMPARISON:  02/03/2015 FINDINGS: Right-sided PICC line and nasogastric catheter are again noted in satisfactory position. The endotracheal tube is been removed in the interval. Poor inspiratory effort is again noted with persistent left basilar opacification. Increasing right basilar atelectasis is noted. IMPRESSION: Poor inspiratory effort of with persistent left basilar changes and new right basilar atelectasis. Electronically Signed   By: Inez Catalina M.D.   On: 02/05/2015 10:05   Dg Chest Port 1 View  02/03/2015  CLINICAL DATA:  Acute respiratory failure with hypoxia. EXAM: PORTABLE CHEST 1 VIEW COMPARISON:  02/02/2015 FINDINGS: Endotracheal tube with tip 4.1 cm above the carina. Nasogastric tube unchanged. Right-sided PICC line has tip over the right atrium. Lungs are hypoinflated demonstrate persistent opacification over the left base/ retrocardiac region obscuration of the hemidiaphragm. Stable mild bilateral interstitial prominence. Stable cardiomegaly. Remainder of the exam is unchanged. IMPRESSION: Stable mild bilateral interstitial prominence with stable left base opacification likely effusions/atelectasis although cannot exclude infection. Stable cardiomegaly. Tubes and lines as described. Right-sided PICC line has tip over the right atrium and could be pulled back approximately 5 cm. These results were called by telephone at the time of interpretation on 02/03/2015 at 9:04 am to patient's nurse, Wardell Honour, who verbally acknowledged these results. Electronically Signed   By: Marin Olp M.D.   On: 02/03/2015 09:04   Portable Chest Xray  02/02/2015  CLINICAL DATA:  Respiratory failure. EXAM: PORTABLE CHEST 1  VIEW COMPARISON:  02/01/2015 . FINDINGS: Endotracheal tube and NG tube in stable position. Right PICC line stable position. Cardiomegaly. Mild pulmonary interstitial prominence. Small  bilateral pleural effusions. Low lung volumes with basilar atelectasis. No pneumothorax. IMPRESSION: 1. Lines and tubes in stable position . 2. Cardiomegaly of mild from interstitial prominence and small bilateral pleural effusions. A component congestive heart failure cannot be excluded. 3. Low lung volumes with persistent bibasilar atelectasis. Electronically Signed   By: [place]   On: 02/02/2015 07:15   Dg Chest Port 1 View  02/01/2015  CLINICAL DATA:  PICC line placement. History of atrial fibrillation, COPD, leukocytosis and smoking. EXAM: PORTABLE CHEST 1 VIEW COMPARISON:  02/01/2015 and 01/31/2015. FINDINGS: 1506 hours. Patient is rotated to the left. The endotracheal and nasogastric tubes appear unchanged. There is new right arm PICC with its tip inferior to the left mainstem bronchus, at the level of the lower SVC allowing for rotation. No evidence of pneumothorax. Bilateral pleural effusions and bibasilar airspace opacities are unchanged. IMPRESSION: PICC line tip at the lower SVC level. No pneumothorax or other significant changes. Electronically Signed   By: Richardean Sale M.D.   On: 02/01/2015 15:33   Dg Chest Port 1 View  02/01/2015  CLINICAL DATA:  Pericardial effusion. EXAM: PORTABLE CHEST 1 VIEW COMPARISON:  01/31/2015.  CT 08/24/2014 . FINDINGS: Endotracheal tube and NG tube in stable position. Stable cardiomegaly. Prior CT revealed a small pericardial effusion. No pulmonary venous congestion. Diffuse bilateral pulmonary interstitial prominence. Previously identified density in the left AP window has resolved, this may have represented atelectasis. Small bilateral pleural effusions. No pneumothorax. IMPRESSION: 1. Lines and tubes in stable position. 2. Diffuse bilateral pulmonary interstitial prominence unchanged. Interstitial edema and/or pneumonitis could present in this fashion. Small bilateral pleural effusions. 3. Previously identified density in the AP window has resolved.  This may have represented atelectasis. Electronically Signed   By: [place]   On: 02/01/2015 07:34   Dg Chest Port 1 View  01/31/2015  CLINICAL DATA:  Endotracheal tube placement. EXAM: PORTABLE CHEST 1 VIEW COMPARISON:  01/31/2015 at 1016 hours. FINDINGS: Endotracheal tube terminates approximately 2.9 cm above the carina. Nasogastric tube is followed into the stomach with the side port in the region of the gastroesophageal junction. There is prominence of the AP window and left hilum. Consolidation in the lingula obscures the left heart border. There is interstitial prominence in the right hemi thorax. IMPRESSION: 1. Endotracheal tube is in satisfactory position. 2. Prominence of the AP window and left hilum, of uncertain etiology. New consolidation in the lingula. Consider further evaluation with CT chest with contrast, as clinically indicated. 3. Progressive interstitial prominence in the right lung, possibly due to edema. Electronically Signed   By: Lorin Picket M.D.   On: 01/31/2015 20:55   US Breast Ltd Uni Left Inc Axilla  01/26/2015  CLINICAL DATA:  69 year old female presenting for delayed six-month follow-up of a probably benign left breast mass. EXAM: DIGITAL DIAGNOSTIC BILATERAL MAMMOGRAM WITH 3D TOMOSYNTHESIS AND CAD LEFT BREAST ULTRASOUND COMPARISON:  Previous exam(s). ACR Breast Density Category b: There are scattered areas of fibroglandular density. FINDINGS: In the lower-inner quadrant of the left breast, there is a persistent oval mass measuring 5 mm on the MLO view. No new masses, areas of distortion or suspicious calcifications are seen in the breasts bilaterally. Mammographic images were processed with CAD. Physical exam targeted to the lower-inner quadrant of the left breast demonstrates no discrete palpable abnormality. Ultrasound targeted  to the lower-inner quadrant of the left breast at 7 o'clock, 2 cm from the nipple demonstrates a hypoechoic superficial oval mass with  indistinct margins measuring 9 x 3 x 4 mm. Previously, the mass measured 5 x 3 x 4 mm. Internal blood flow is documented within the mass. IMPRESSION: 1. Interval increase in size of the superficial mass at 7 o'clock. This may represent a papilloma or possibly a fibroadenoma. 2.  No evidence of malignancy in the right breast. RECOMMENDATION: Ultrasound-guided biopsy is recommended for the left breast mass at 7 o'clock which has increased in size. The patient states that she will need to call back for an appointment, and prefers to come in mid November. I have discussed the findings and recommendations with the patient. Results were also provided in writing at the conclusion of the visit. If applicable, a reminder letter will be sent to the patient regarding the next appointment. BI-RADS CATEGORY  4: Suspicious. Electronically Signed   By: Ammie Ferrier M.D.   On: 01/26/2015 15:01   Mm Diag Breast Tomo Bilateral  01/26/2015  CLINICAL DATA:  69 year old female presenting for delayed six-month follow-up of a probably benign left breast mass. EXAM: DIGITAL DIAGNOSTIC BILATERAL MAMMOGRAM WITH 3D TOMOSYNTHESIS AND CAD LEFT BREAST ULTRASOUND COMPARISON:  Previous exam(s). ACR Breast Density Category b: There are scattered areas of fibroglandular density. FINDINGS: In the lower-inner quadrant of the left breast, there is a persistent oval mass measuring 5 mm on the MLO view. No new masses, areas of distortion or suspicious calcifications are seen in the breasts bilaterally. Mammographic images were processed with CAD. Physical exam targeted to the lower-inner quadrant of the left breast demonstrates no discrete palpable abnormality. Ultrasound targeted to the lower-inner quadrant of the left breast at 7 o'clock, 2 cm from the nipple demonstrates a hypoechoic superficial oval mass with indistinct margins measuring 9 x 3 x 4 mm. Previously, the mass measured 5 x 3 x 4 mm. Internal blood flow is documented within the  mass. IMPRESSION: 1. Interval increase in size of the superficial mass at 7 o'clock. This may represent a papilloma or possibly a fibroadenoma. 2.  No evidence of malignancy in the right breast. RECOMMENDATION: Ultrasound-guided biopsy is recommended for the left breast mass at 7 o'clock which has increased in size. The patient states that she will need to call back for an appointment, and prefers to come in mid November. I have discussed the findings and recommendations with the patient. Results were also provided in writing at the conclusion of the visit. If applicable, a reminder letter will be sent to the patient regarding the next appointment. BI-RADS CATEGORY  4: Suspicious. Electronically Signed   By: Ammie Ferrier M.D.   On: 01/26/2015 15:01    Assessment/Plan  Principal Problem:  Ruptured appendix Active Problems:  Perforated appendicitis  Hypoglycemia  Acute respiratory failure with hypoxemia  Sepsis ([place])  Acute respiratory failure with hypoxemia ([facility])  Atrial fibrillation ([facility])  Acute diastolic heart failure ([facility])  Wide-complex tachycardia ([place])   1. Abnormal NST: Nuclear stress test with ischemia in the lateral wall. She has a long history of tobacco use, dyslipidemia, DM and now with nonsustained ventricular tachycardia on telemetry.  For cath today.  Continue statin/BB/ASA.  2. Acute ruptured appendix with forming pelvic abscess: s/p laparoscopic appendectomy - per surgery  3. Acute diastolic CHF secondary to acute illness: - appears euvolemic on exam after IV lasix. Now on PO lasix.   4. Mild to moderate pericardial  effusion: ? Etiology - repeat echo in 2 weeks to ensure this is resolving.  5. Post-operative Atrial Fibrillation: with no further reoccurrence. She has not had any history of PAF in the past and this was in the setting of acute illness. Would not recommend long term anticoagulation at this time unless she has further episodes. This  patients CHA2DS2-VASc Score and unadjusted Ischemic Stroke Rate (% per year) is equal to 3.2 % stroke rate/year from a score of 3.  6. Hypokalemia/Hypomagnesemia - repleted per IM  7. NSVT: no further recurrence on telemetry. This occurred in the setting of hypokalemia, which has been repleated. However with high risk NST, will also need to r/o coronary ischemia.   8. Tobacco Abuse: 30+ year history. Smoking cessation strongly encouraged.    Mercedes Margarita, MD  02/15/2015  10:21 AM

## 2015-02-16 ENCOUNTER — Encounter (HOSPITAL_COMMUNITY): Payer: Self-pay | Admitting: Cardiology

## 2015-02-16 LAB — BASIC METABOLIC PANEL
ANION GAP: 9 (ref 5–15)
BUN: 11 mg/dL (ref 6–20)
CALCIUM: 8.6 mg/dL — AB (ref 8.9–10.3)
CHLORIDE: 101 mmol/L (ref 101–111)
CO2: 29 mmol/L (ref 22–32)
Creatinine, Ser: 0.64 mg/dL (ref 0.44–1.00)
GFR calc non Af Amer: >60 mL/min (ref >60)
Glucose, Bld: 216 mg/dL — ABNORMAL HIGH (ref 65–99)
Potassium: 3.1 mmol/L — ABNORMAL LOW (ref 3.5–5.1)
Sodium: 139 mmol/L (ref 135–145)

## 2015-02-16 LAB — LIPID PANEL
Cholesterol: 173 mg/dL (ref 0–200)
HDL: 46 mg/dL (ref >40)
LDL CALC: 86 mg/dL (ref 0–99)
Total CHOL/HDL Ratio: 3.8 RATIO
Triglycerides: 204 mg/dL — ABNORMAL HIGH (ref <150)
VLDL: 41 mg/dL — ABNORMAL HIGH (ref 0–40)

## 2015-02-16 LAB — CBC
HEMATOCRIT: 35 % — AB (ref 36.0–46.0)
HEMOGLOBIN: 11 g/dL — AB (ref 12.0–15.0)
MCH: 28.1 pg (ref 26.0–34.0)
MCHC: 31.4 g/dL (ref 30.0–36.0)
MCV: 89.5 fL (ref 78.0–100.0)
Platelets: 576 10*3/uL — ABNORMAL HIGH (ref 150–400)
RBC: 3.91 MIL/uL (ref 3.87–5.11)
RDW: 15.6 % — ABNORMAL HIGH (ref 11.5–15.5)
WBC: 10.3 10*3/uL (ref 4.0–10.5)

## 2015-02-16 LAB — GLUCOSE, CAPILLARY
GLUCOSE-CAPILLARY: 212 mg/dL — AB (ref 65–99)
GLUCOSE-CAPILLARY: 210 mg/dL — AB (ref 65–99)

## 2015-02-16 LAB — MAGNESIUM: Magnesium: 1.5 mg/dL — ABNORMAL LOW (ref 1.7–2.4)

## 2015-02-16 MED ORDER — OXYCODONE-ACETAMINOPHEN 5-325 MG PO TABS: 1.0000 | ORAL_TABLET | ORAL | Status: DC | PRN

## 2015-02-16 MED ORDER — SODIUM CHLORIDE 0.9 % IJ SOLN
10.0000 mL | INTRAMUSCULAR | Status: DC | PRN
  Administered 2015-02-16 (×2): 10 mL

## 2015-02-16 MED ORDER — MAGNESIUM SULFATE 2 GM/50ML IV SOLN
2.0000 g | Freq: Once | INTRAVENOUS | Status: AC
  Administered 2015-02-16: 2 g via INTRAVENOUS
  Filled 2015-02-16: qty 50

## 2015-02-16 MED ORDER — METFORMIN HCL ER 500 MG PO TB24: 1000.0000 mg | ORAL_TABLET | Freq: Two times a day (BID) | ORAL | Status: DC

## 2015-02-16 MED ORDER — INSULIN STARTER KIT- PEN NEEDLES (ENGLISH)
1.0000 | Freq: Once | Status: DC
  Filled 2015-02-16: qty 1

## 2015-02-16 MED ORDER — ENSURE ENLIVE PO LIQD: 237.0000 mL | Freq: Every morning | ORAL | Status: DC

## 2015-02-16 MED ORDER — SACCHAROMYCES BOULARDII 250 MG PO CAPS: 250.0000 mg | ORAL_CAPSULE | Freq: Two times a day (BID) | ORAL | Status: DC

## 2015-02-16 MED ORDER — INSULIN GLARGINE 100 UNIT/ML SOLOSTAR PEN: 5.0000 [IU] | PEN_INJECTOR | Freq: Every day | SUBCUTANEOUS | Status: DC

## 2015-02-16 MED ORDER — METOPROLOL TARTRATE 25 MG PO TABS: 25.0000 mg | ORAL_TABLET | Freq: Two times a day (BID) | ORAL | Status: DC

## 2015-02-16 MED ORDER — POTASSIUM CHLORIDE CRYS ER 20 MEQ PO TBCR
40.0000 meq | EXTENDED_RELEASE_TABLET | ORAL | Status: AC
  Administered 2015-02-16 (×2): 40 meq via ORAL
  Filled 2015-02-16 (×2): qty 2

## 2015-02-16 MED ORDER — LIVING WELL WITH DIABETES BOOK
Freq: Once | Status: AC
  Administered 2015-02-16: 1
  Filled 2015-02-16: qty 1

## 2015-02-16 MED ORDER — LISINOPRIL 5 MG PO TABS: 2.5000 mg | ORAL_TABLET | Freq: Every day | ORAL | Status: DC

## 2015-02-16 NOTE — Progress Notes (Signed)
SATURATION QUALIFICATIONS: (This note is used to comply with regulatory documentation for home oxygen)  Patient Saturations on Room Air at Rest = 97%  Patient Saturations on Room Air while Ambulating = 86%  Patient Saturations on 2Liters of oxygen while Ambulating = 92%  Please briefly explain why patient needs home oxygen: 

## 2015-02-16 NOTE — Progress Notes (Signed)
SATURATION QUALIFICATIONS: (This note is used to comply with regulatory documentation for home oxygen)  Patient Saturations on Room Air at Rest = 97%  Patient Saturations on Room Air while Ambulating = 86%  Patient Saturations on  Liters of oxygen while Ambulating = %  Please briefly explain why patient needs home oxygen:

## 2015-02-16 NOTE — Discharge Summary (Addendum)
Discharge Summary  Mercedes Thornton TWS:[id number] DOB: 10/23/1945  PCP: Antonietta Jewel, MD  Admit date: 01/31/2015 Discharge date: 02/16/2015  Time spent: >75mins  Recommendations for Outpatient Follow-up:  1. F/u with PMD within a two weeks for hospital discharge follow up, pmd to monitor blood sugar/ blood pressure control. 2. F/u with general surgery Dr. Lucia Gaskins 3. F/u with cardiology Dr. Radford Pax, repeat echocardiogram in two weeks to f/u pericardioeffusion, cardiology to arrange the test.  4. F/u with pulmonology Dr. Waunita Schooner for hypoxia, lung infiltrate.   Discharge Diagnoses:  Active Hospital Problems   Diagnosis Date Noted  . Ruptured appendix 01/31/2015  . Abnormal nuclear stress test 02/14/2015  . Ventricular tachyarrhythmia ([place])   . Wide-complex tachycardia ([place]) 02/12/2015  . Acute diastolic heart failure ([place]) 02/11/2015  . Acute respiratory failure with hypoxemia ([place])   . Atrial fibrillation ([place])   . Hypoglycemia 02/01/2015  . Acute respiratory failure with hypoxemia 02/01/2015  . Sepsis ([place]) 02/01/2015  . Perforated appendicitis 01/31/2015    Resolved Hospital Problems   Diagnosis Date Noted Date Resolved  No resolved problems to display.    Discharge Condition: stable  Diet recommendation: heart healthy/carb modified  Filed Weights   02/14/15 0529 02/15/15 0406 02/16/15 0543  Weight: 165 lb 2 oz (74.9 kg) 162 lb 1.6 oz (73.528 kg) 161 lb 12.8 oz (73.392 kg)    History of present illness:  69 y/o F, current smoker, with a PMH of COPD, DM, HLD, allergic rhinitis, mediastinal adenopathy, pericardial effusion (seen on CT in 08/2014), and bipolar disorder who presented to [facility] on 10/19 with nausea/vomiting, abdominal pain, chills and productive cough.   The patient reports she began having lower abdominal pain on Saturday October 10/15. Symptoms progressed to nausea, vomiting and diarrhea that lasted all day. She had some relief of abdominal pain on Sunday October  11th. She had decreased energy, decreased oral intake and shaking chills alternating with drenching sweats throughout the weekend.   Initial ER evaluation noted BP of 88/55, sat 77%, afebrile and transiently tachycardic (122 highest). WBC 18.4, Hgb 15.5, BUN 62, Sr Cr 0.87, glucose 43. CT of the abdomen/pelvis was reviewed which demonstrated findings most compatible with ruptured appendicitis with developing pelvic abscess, moderate small bowel dilation concerning for ileus rather than obstruction, cholelithiasis, unchanged pericardial effusion, distal esophageal thickening. She was treated with NS volume resuscitation, flagyl and rocephin in the ER. Hypoglycemia was addressed as well. The patients blood pressure responded well to IVF and returned to normal. Lactic acid 1.98, albumin 2.8, AST 28/ALT 18. PCCM consulted for evaluation / admission.   Sister at bedside reports she is a current 1ppd smoker for approximately 50 years. She is a retired Pharmacist, hospital. PCP - Dr. Alinda Deem.  Patient underwent Lysis of adhesions for approximately 40 minutes with laparoscopic appendectomy and evacuation of pelvic abscesses. The procedure was done by general surgery on 10/19. Postoperatively she went into acute hypoxemic respiratory failure and required ventilatory support. She had sepsis with hypotension requiring IV pressor support. She was treated with broad-spectrum IV and microbial therapy with Primaxin, ciprofloxacin and IV vancomycin. She showed gradual clinical improvement and was extubated on 02/04/2015.she showed steady improvement and transferred from ICU to [facility]ad hospitalist service on 10/27. Cardiology also consulted due to postop afib/diastolic chf/pericardioeffusion/NSVT, she underwent cardiac catheterization on 11/4 due to hight risk stress test. She does has 50% RCA lesion, cardiology recommended risk factor modification and medical management.   Her surgical drain was discontinued on 11/2, abx  stopped on  11/2, she will need to follow up with general surgery Dr. Lucia Gaskins.    Hospital Course:  Principal Problem:   Ruptured appendix Active Problems:   Perforated appendicitis   Hypoglycemia   Acute respiratory failure with hypoxemia   Sepsis ([place])   Acute respiratory failure with hypoxemia ([facility])   Atrial fibrillation ([facility])   Acute diastolic heart failure ([facility])   Wide-complex tachycardia ([facility])   Abnormal nuclear stress test   Ventricular tachyarrhythmia ([place])  1. Acute hypoxemic respiratory failure. -Postoperatively patient requiring ventilator support. Extubated on 02/04/2015.  -Could be secondary to volume overload with imaging studies revealing acute pulmonary edema. Patient also having a history of structural lung disease in setting of chronic obstructive lung disease. -Repeat chest x-ray performed on 02/08/2015 showing diffuse interstitial prominence which could reflect combination of COPD and edema. Atelectasis. -received iv lasix, then oral lasix, repeat cxr on 11/2 with improvement, but persistent bibasilar atelectasis, bronchitic changes and minimal persistent interstitial prominence remains,  -she received oral lasix till 11/3 after cardiac cath showed low normal LVEDP,  continue incentive spirometer, her ambulatory pulse ox dropped to 86 on room air, she is discharged home with oxygen supplement 2L/mins with activity. She is instructed to f/u with pulmonology for hypoxia and bronchitic changes and persistent interstitial prominence on cxr. May be able to wean off oxygen as patient continue to improve.  2. Ruptured appendicitis -Patient undergoing laparoscopic appendectomy with evacuation of pelvic abscess on 01/31/2015, procedure performed by Dr. Lucia Gaskins of general surgery. -Postoperative course, complicated by sepsis, she remains on antibiotic therapy with Primaxin 500 mg IV every 6 hours. Wbc trending down ( h/o chronic leukocytosis) -Surgery following,  surgery recommended stop abx on 11/2, hold on repeat imaging due to clinically improving, surgical drain removed on 11/2, she will follow up with Dr. Lucia Gaskins outpatient.    3. Sepsis -Present on admission, source of infection likely ruptured appendix. Sepsis evidenced by the presence of shock, requiring IV pressor support, acute respiratory failure requiring ventilator support. -Blood cultures obtained on 01/31/2015 at 02/03/2015 showing no growth -finished treatment with Primaxin, last dose on 11/2. -sepsis resolved,  4. Acute Diatolic CHF/pulmonary edema -TTE performed on 02/02/2015 showing preserved EF  - s/p IV lasix -now Lasix 40 mg PO BID. No edema, on 2liter oxygen, continue wean oxygen -lasix stopped per cardiology due to low normal LVEDP (75mmHg).   5. Pericardial effusion. -Patient having a moderate pericardial effusion seen on transthoracic echocardiogram from 02/02/2015. Echo did not reveal evidence of hemodynamic compromise -Repeat transthoracic echocardiogram performed on 02/09/2015 showing small to moderate pleural effusion without evidence of hemodynamic compromise. -cardiology will schedule repeat echocardiogram in two week after discharge.  6. A. Fib (paroxysimal , post op) -Patient's hospital course, complicated by A. fib RVR requiring amiodarone drip -She converted to sinus rhythm, amiodarone drip discontinued, was on Cardizem 120 mg, changed to metoprolol per cardiology recommendation. No need of anticoagulation per cardiology. -cardiology consulted. Continue outpatient cardiology follow up.  7. Hypokalemia -Hypokalemia likely secondary to diuresis, replace prn to keep K >4.  8. Hypomagnesemia -Replace prn to keep mag >2.  9. NSVT, keep mag>2, k >4, cardiology consulted. On betablocker.  10: high risk stress test: denies chest pain, on asa/statin/betablocker, cardiac cath 11/3.   11. noninsulin dependent diabetes, on metformin/amaryl PTA, a1c 7.5, on ssi  here since admission, on levemir in the hospital due to persistent elevated am blood glucose. Patient prefer not to take insulin at discharge, she is discharged with increased dose  of metformin and amaryl, diet control, diabetic education provided in the hospital. She is instructed to close follow up with pmd for blood sugar control.  12. smoking cessation education provided.   Code Status: Full code Family Communication: patient and daughter crystal and sister linda. Disposition Plan: d/c home with daughter in [place],  home health /home oxygen arranged.   Consultants:  Pulmonary critical care medicine  General surgery  cardiology  Procedures:  Laparoscopic appendectomy procedure performed on 01/31/2015  picc line (right), removed at discharge  Intubated on 10/19 and extubated on 10/23  Cardiac stress test on 11/2  Cardiac catheterization on 11/3.  Antibiotics:  Permaxin stopped on 11/2   Discharge Exam: BP 120/52 mmHg  Pulse 65  Temp(Src) 98.5 F (36.9 C) (Oral)  Resp 20  Ht 5\' 10"  (1.778 m)  Wt 161 lb 12.8 oz (73.392 kg)  BMI 23.22 kg/m2  SpO2 92%   General: NAD, but frail  Cardiovascular: Regular rate rhythm normal S1-S2  Respiratory: diminished, no rales, no rhonchi, no wheezing  Abdomen: Surgical incision sites appears clean, abd soft nontender nondistended, RLQ drain removed, dressing intact.  Musculoskeletal: trace edema to lower extremities has resolved.   Discharge Instructions You were cared for by a hospitalist during your hospital stay. If you have any questions about your discharge medications or the care you received while you were in the hospital after you are discharged, you can call the unit and asked to speak with the hospitalist on call if the hospitalist that took care of you is not available. Once you are discharged, your primary care physician will handle any further medical issues. Please note that NO REFILLS for any discharge  medications will be authorized once you are discharged, as it is imperative that you return to your primary care physician (or establish a relationship with a primary care physician if you do not have one) for your aftercare needs so that they can reassess your need for medications and monitor your lab values.      Discharge Instructions    Diet - low sodium heart healthy    Complete by:  As directed   Low salt, low fat, carb modified.     Face-to-face encounter (required for Medicare/Medicaid patients)    Complete by:  As directed   I Mercedes Thornton certify that this patient is under my care and that I, or a nurse practitioner or physician's assistant working with me, had a face-to-face encounter that meets the physician face-to-face encounter requirements with this patient on 02/12/2015. The encounter with the patient was in whole, or in part for the following medical condition(s) which is the primary reason for home health care (List medical condition): FTT  The encounter with the patient was in whole, or in part, for the following medical condition, which is the primary reason for home health care:  FTT  I certify that, based on my findings, the following services are medically necessary home health services:   Nursing Physical therapy    Reason for Medically Necessary Home Health Services:  Skilled Nursing- Change/Decline in Patient Status  My clinical findings support the need for the above services:  Shortness of breath with activity  Further, I certify that my clinical findings support that this patient is homebound due to:  Shortness of Breath with activity     For home use only DME oxygen    Complete by:  As directed   With activity  Mode or (Route):  Nasal cannula  Liters  per Minute:  2  Frequency:  Continuous (stationary and portable oxygen unit needed)  Oxygen delivery system:  Gas     Home Health    Complete by:  As directed   To provide the following care/treatments:   PT OT RN        Increase activity slowly    Complete by:  As directed             Medication List    STOP taking these medications        citalopram 20 MG tablet  Commonly known as:  CELEXA     Cod Liver Oil Caps     UNABLE TO FIND      TAKE these medications        albuterol 108 (90 BASE) MCG/ACT inhaler  Commonly known as:  PROVENTIL HFA;VENTOLIN HFA  Inhale 2 puffs into the lungs every 6 (six) hours as needed for wheezing.     aspirin 81 MG tablet  Take 81 mg by mouth daily.     cetirizine 10 MG tablet  Commonly known as:  ZYRTEC  Take 10 mg by mouth daily.     Cinnamon 500 MG Tabs  Take 1 tablet by mouth daily.     clonazePAM 1 MG tablet  Commonly known as:  KLONOPIN  Take 1 tablet by mouth daily.     feeding supplement (ENSURE ENLIVE) Liqd  Take 237 mLs by mouth every morning.     fluticasone 50 MCG/ACT nasal spray  Commonly known as:  FLONASE  Place 1 spray into both nostrils daily as needed for allergies.     glimepiride 4 MG tablet  Commonly known as:  AMARYL  Take 1 tablet by mouth daily.     lisinopril 5 MG tablet  Commonly known as:  PRINIVIL,ZESTRIL  Take 0.5 tablets (2.5 mg total) by mouth daily.     lovastatin 20 MG tablet  Commonly known as:  MEVACOR  Take 20 mg by mouth at bedtime.     metFORMIN 500 MG 24 hr tablet  Commonly known as:  GLUCOPHAGE-XR  Take 2 tablets (1,000 mg total) by mouth 2 (two) times daily.     metoprolol tartrate 25 MG tablet  Commonly known as:  LOPRESSOR  Take 1 tablet (25 mg total) by mouth 2 (two) times daily.     oxyCODONE-acetaminophen 5-325 MG tablet  Commonly known as:  PERCOCET/ROXICET  Take 1-2 tablets by mouth every 4 (four) hours as needed for moderate pain.     saccharomyces boulardii 250 MG capsule  Commonly known as:  FLORASTOR  Take 1 capsule (250 mg total) by mouth 2 (two) times daily.     vitamin C 500 MG tablet  Commonly known as:  ASCORBIC ACID  Take 500 mg by mouth daily.     Vitamin D3 3000  UNITS Tabs  Take 1 tablet by mouth daily.     WOMENS MULTIVITAMIN PLUS PO  Take 1 tablet by mouth daily.       Allergies  Allergen Reactions  . Penicillins Hives and Swelling    Has patient had a PCN reaction causing immediate rash, facial/tongue/throat swelling, SOB or lightheadedness with hypotension: Yes Has patient had a PCN reaction causing severe rash involving mucus membranes or skin necrosis: Yes Has patient had a PCN reaction that required hospitalization Yes- hospital for 7 days Has patient had a PCN reaction occurring within the last 10 years: No If all of the above answers are "NO",  then may proceed with Cephalosporin use.    Follow-up Information    Follow up with [facility], [place].   Specialty:  Internal Medicine   Why:  hospital discharge follow up, pmd to monitor blood sugar and blood pressure control.   Contact information:   [address] [address] [phone]       Follow up with Shann Medal, MD On 03/07/2015.   Specialty:  General Surgery   Why:  arrive by 8:45AM for a 9:15AM post op check   Contact information:   [address] [place] [address] [phone]       Follow up with Simonne Maffucci, MD In 3 weeks.   Specialty:  Pulmonary Disease   Why:  hypoxia,    Contact information:   [address]  [place] [address] [phone]       Follow up with Sueanne Margarita, MD In 2 months.   Specialty:  Cardiology   Why:  cardiology also will schedule repeat echocardiogram in two weeks.   Contact information:   [address] [place] [address] [phone]        The results of significant diagnostics from this hospitalization (including imaging, microbiology, ancillary and laboratory) are listed below for reference.    Significant Diagnostic Studies: Ct Abdomen Pelvis Wo Contrast  01/31/2015  CLINICAL DATA:  Abdominal distension. Abdominal pain. Weakness. Leukocytosis. EXAM: CT ABDOMEN AND  PELVIS WITHOUT CONTRAST TECHNIQUE: Multidetector CT imaging of the abdomen and pelvis was performed following the standard protocol without IV contrast. COMPARISON:  Chest CT 08/24/2014 FINDINGS: Subpleural opacities in the right lower lobe likely reflect dependent subsegmental atelectasis and trace pleural fluid or thickening. There is minimal atelectasis or scarring in the left lung base. Coronary artery calcification is noted. Pericardial effusion measures up to 1.5 cm in thickness and overall appears similar in volume to the prior chest CT although with a greater amount of the fluid located posteriorly on the current examination. Small gallstones are noted. No biliary dilatation is seen. The liver, kidneys, and pancreas have an unremarkable unenhanced appearance. A subcentimeter calcification in the spleen is unchanged, as is minimal bilateral adrenal gland thickening. There is the suggestion of mild distal esophageal wall thickening with a small amount of fluid or debris present, incompletely visualized. The appendix is identified in the right lower and central abdomen and is mildly distended and thick walled diffusely, measuring up to 13 mm in diameter. There is mild surrounding inflammatory stranding, and there is a small locule of extraluminal gas immediately inferior to the proximal appendix (series 2, image 69). More inferiorly in the right adnexa is a 4.2 x 3.6 cm gas and fluid collection. There is additional fluid and gas in the left pelvis which appears less organized, measuring approximately 9.6 x 3.9 cm (series 2, image 78). There is moderate dilatation of multiple proximal and mid small bowel loops containing air-fluid levels and measuring up to 5.6 cm in diameter. Gas and fluid are also present an nondilated colon. There is mild sigmoid colon wall thickening which is favored to be secondary to adjacent pelvic inflammation. Bladder is unremarkable. Uterus is present. Extensive atherosclerotic  vascular calcification is noted. Lower lumbar facet arthrosis is noted. Subcentimeter retroperitoneal lymph nodes are likely reactive. IMPRESSION: 1. Findings most compatible with ruptured appendicitis with likely developing pelvic abscesses. 2. Moderate small bowel dilatation favored to reflect ileus rather than mechanical obstruction. 3. Cholelithiasis. 4. Unchanged pericardial effusion. 5. Distal esophageal  wall thickening, query esophagitis. These results were called by telephone at the time of interpretation on 01/31/2015 at 11:51 am to Dr. Tyrone Nine, who verbally acknowledged these results. Electronically Signed   By: Logan Bores M.D.   On: 01/31/2015 11:58   Dg Chest 2 View  02/14/2015  CLINICAL DATA:  Shortness of breath, history COPD, smoking, diabetes mellitus EXAM: CHEST  2 VIEW COMPARISON:  02/09/2015 FINDINGS: RIGHT arm PICC line tip projects over SVC near cavoatrial junction. Enlargement of cardiac silhouette. Atherosclerotic calcification aorta. Pulmonary vascularity normal. Bronchitic changes with bibasilar atelectasis. Mild interstitial prominence in both lungs unchanged. Pleural thickening/scarring lateral RIGHT upper hemi thorax unchanged. No pleural effusion or pneumothorax. Bones demineralized. IMPRESSION: Enlargement of cardiac silhouette. Bronchitic changes with persistent bibasilar atelectasis. Minimal persistent interstitial prominence question residual infiltrate. Electronically Signed   By: Lavonia Dana M.D.   On: 02/14/2015 08:27   Dg Chest 2 View  02/09/2015  CLINICAL DATA:  Short of breath EXAM: CHEST  2 VIEW COMPARISON:  02/08/2015 FINDINGS: Mild cardiomegaly. Small bilateral effusions are not significantly changed. Diffuse interstitial lung disease is improved. No pneumothorax. Stable right PICC. IMPRESSION: Stable small pleural effusions. Diffuse interstitial disease is improved likely due to improving edema. Electronically Signed   By: Marybelle Killings M.D.   On: 02/09/2015 11:51    Dg Chest 2 View  01/31/2015  CLINICAL DATA:  Cough and congestion with increased weakness over the past 5 days. EXAM: CHEST  2 VIEW COMPARISON:  Chest CT 08/24/2014 FINDINGS: Cardiopericardial enlargement appears increased compared to scanogram from previous study, when pericardial effusion was present, but there is rotation to the left. Stable aortic and hilar contours. Poorly defined left diaphragm, likely from cardiac contact, with no pleural effusion seen on lateral imaging. Mild interstitial coarsening correlating with emphysema on previous CT. There is no edema, consolidation, or pneumothorax. IMPRESSION: 1. Cardiopericardial enlargement with apparent increase from chest CT 08/24/2014 potentially due to rotation. Given a pericardial effusion was present on the comparison CT, consider echocardiogram follow-up. 2. Emphysema without superimposed pneumonia or edema. Electronically Signed   By: Monte Fantasia M.D.   On: 01/31/2015 10:33   Ct Abdomen Pelvis W Contrast  02/05/2015  CLINICAL DATA:  Leukocytosis, post appendectomy and lysis of adhesions on 01/31/2015, underlying COPD, diabetes mellitus, smoker EXAM: CT ABDOMEN AND PELVIS WITH CONTRAST TECHNIQUE: Multidetector CT imaging of the abdomen and pelvis was performed using the standard protocol following bolus administration of intravenous contrast. Sagittal and coronal MPR images reconstructed from axial data set. CONTRAST:  170mL OMNIPAQUE IOHEXOL 300 MG/ML SOLN IV. Oral contrast was not administered. COMPARISON:  01/31/2015 FINDINGS: Bibasilar atelectasis, difficult to exclude mild consolidation in lower lobes. Small pericardial effusion. Liver, spleen, pancreas, pancreas, and adrenal glands normal appearance. Dependent gallstone in gallbladder. Nasogastric tube in stomach. Surgical drain in pelvis and RIGHT lower quadrant. Few small bowel loops superior mildly distended without definite evidence of obstruction. Mild bowel wall thickening of  small bowel loops in pelvis with scattered ascites and areas of enhancement question peritonitis Focal cul-de-sac fluid collection measures up to 8.8 x 1.5 x 3.5 cm in size question abscess. Numerous additional small interloop collections of fluid are identified which do not demonstrate the same degree of margination or enhancement Air and Foley catheter within urinary bladder. Unremarkable uterus and adnexa. Colon decompressed. No mass or adenopathy. Scattered atherosclerotic calcifications without aneurysm. No free air or hernia. Bones unremarkable. IMPRESSION: Post appendectomy with scattered inflammatory changes in the pelvis, which include bowel wall  thickening and enhancement of multiple small bowel loops as well has a loculated fluid collection with enhancing margins in the pelvis/cul de sac 8.8 x 1.5 x 3.5 cm in size question peritonitis with pelvic abscess. Multiple additional small interloop collections of fluid are seen along bowel loops without the similar degree of enhancement or demarcation. Cholelithiasis. Electronically Signed   By: Lavonia Dana M.D.   On: 02/05/2015 15:59   Nm Myocar Multi W/spect W/wall Motion / Ef  02/13/2015  CLINICAL DATA:  Ventricular tachyarrhythmia EXAM: MYOCARDIAL IMAGING WITH SPECT (REST AND PHARMACOLOGIC-STRESS) GATED LEFT VENTRICULAR WALL MOTION STUDY LEFT VENTRICULAR EJECTION FRACTION TECHNIQUE: Standard myocardial SPECT imaging was performed after resting intravenous injection of 10 mCi Tc-27m sestamibi. Subsequently, intravenous infusion of Lexiscan was performed under the supervision of the Cardiology staff. At peak effect of the drug, 30 mCi Tc-47m sestamibi was injected intravenously and standard myocardial SPECT imaging was performed. Quantitative gated imaging was also performed to evaluate left ventricular wall motion, and estimate left ventricular ejection fraction. COMPARISON:  None. FINDINGS: Perfusion: There is decreased activity noted in the lateral wall  on stress images relative to rest images concerning for spleen. Wall Motion: Normal left ventricular wall motion. No left ventricular dilation. Left Ventricular Ejection Fraction: 67 % End diastolic volume 51 ml End systolic volume 17 ml IMPRESSION: 1. Findings concerning for inducible ischemia in the lateral wall. 2. Normal left ventricular wall motion. 3. Left ventricular ejection fraction 67% 4. High-risk stress test findings*. *2012 Appropriate Use Criteria for Coronary Revascularization Focused Update: J Am Coll Cardiol. 2197;58(8):325-498. http://content.airportbarriers.com.aspx?articleid=[email or web address] Electronically Signed   By: Rolm Baptise M.D.   On: 02/13/2015 14:32   Dg Chest Port 1 View  02/08/2015  CLINICAL DATA:  Acute respiratory failure. EXAM: PORTABLE CHEST 1 VIEW COMPARISON:  02/07/2015 FINDINGS: A right PICC remains in place with tip projecting over the lower SVC. Cardiomediastinal silhouette is unchanged. Diffusely increased interstitial markings are stable to slightly increased compared to the prior study with more patchy opacities remaining in both lung bases. Small bilateral pleural effusions are not excluded. No pneumothorax is seen. IMPRESSION: 1. Diffuse interstitial prominence which may reflect a combination of underlying COPD and superimposed edema. 2. Patchy bibasilar opacities suggestive of atelectasis with small pleural effusions not excluded. Electronically Signed   By: Logan Bores M.D.   On: 02/08/2015 07:45   Dg Chest Port 1 View  02/07/2015  CLINICAL DATA:  Productive cough. EXAM: PORTABLE CHEST 1 VIEW COMPARISON:  February 06, 2015. FINDINGS: Stable cardiomediastinal silhouette. No pneumothorax is noted. Right-sided PICC line is noted with tip in expected position of the SVC. Mild central pulmonary vascular congestion is noted with probable bilateral pulmonary edema. Stable left basilar opacity is noted consistent with atelectasis and effusion. Bony thorax is unremarkable.  Nasogastric tube has been removed. IMPRESSION: Mild central pulmonary vascular congestion is noted with possible bilateral perihilar edema. Stable left basilar opacity is noted concerning for atelectasis and effusion. Electronically Signed   By: Marijo Conception, M.D.   On: 02/07/2015 10:17   Dg Chest Port 1 View  02/06/2015  CLINICAL DATA:  Respiratory failure EXAM: PORTABLE CHEST 1 VIEW COMPARISON:  February 05, 2015 and February 01, 2015; chest CT Aug 24, 2014 FINDINGS: Central catheter tip is in the superior vena cava. Nasogastric tube tip extends into the stomach. No pneumothorax. There is airspace consolidation in the left base with small left effusion, stable. There is a minimal right effusion. The interstitium is diffusely prominent, likely with  a mild degree of interstitial edema. There is no new parenchymal lung opacity. Heart is upper normal in size with pulmonary vascularity within normal limits. Atherosclerotic changes noted in aorta. No adenopathy is appreciable. IMPRESSION: Tube positions as described without pneumothorax. Persistent left lower lobe consolidation. Small pleural effusions bilaterally with mild interstitial edema. Suspect a degree of underlying congestive heart failure. The lungs and cardiac silhouette appears stable compared to 1 day prior. Electronically Signed   By: Lowella Grip III M.D.   On: 02/06/2015 07:20   Dg Chest Port 1 View  02/05/2015  CLINICAL DATA:  Acute respiratory failure EXAM: PORTABLE CHEST - 1 VIEW COMPARISON:  02/03/2015 FINDINGS: Right-sided PICC line and nasogastric catheter are again noted in satisfactory position. The endotracheal tube is been removed in the interval. Poor inspiratory effort is again noted with persistent left basilar opacification. Increasing right basilar atelectasis is noted. IMPRESSION: Poor inspiratory effort of with persistent left basilar changes and new right basilar atelectasis. Electronically Signed   By: Inez Catalina M.D.    On: 02/05/2015 10:05   Dg Chest Port 1 View  02/03/2015  CLINICAL DATA:  Acute respiratory failure with hypoxia. EXAM: PORTABLE CHEST 1 VIEW COMPARISON:  02/02/2015 FINDINGS: Endotracheal tube with tip 4.1 cm above the carina. Nasogastric tube unchanged. Right-sided PICC line has tip over the right atrium. Lungs are hypoinflated demonstrate persistent opacification over the left base/ retrocardiac region obscuration of the hemidiaphragm. Stable mild bilateral interstitial prominence. Stable cardiomegaly. Remainder of the exam is unchanged. IMPRESSION: Stable mild bilateral interstitial prominence with stable left base opacification likely effusions/atelectasis although cannot exclude infection. Stable cardiomegaly. Tubes and lines as described. Right-sided PICC line has tip over the right atrium and could be pulled back approximately 5 cm. These results were called by telephone at the time of interpretation on 02/03/2015 at 9:04 am to patient's nurse, Wardell Honour, who verbally acknowledged these results. Electronically Signed   By: Marin Olp M.D.   On: 02/03/2015 09:04   Portable Chest Xray  02/02/2015  CLINICAL DATA:  Respiratory failure. EXAM: PORTABLE CHEST 1 VIEW COMPARISON:  02/01/2015 . FINDINGS: Endotracheal tube and NG tube in stable position. Right PICC line stable position. Cardiomegaly. Mild pulmonary interstitial prominence. Small bilateral pleural effusions. Low lung volumes with basilar atelectasis. No pneumothorax. IMPRESSION: 1. Lines and tubes in stable position . 2. Cardiomegaly of mild from interstitial prominence and small bilateral pleural effusions. A component congestive heart failure cannot be excluded. 3. Low lung volumes with persistent bibasilar atelectasis. Electronically Signed   By: [place]   On: 02/02/2015 07:15   Dg Chest Port 1 View  02/01/2015  CLINICAL DATA:  PICC line placement. History of atrial fibrillation, COPD, leukocytosis and smoking. EXAM:  PORTABLE CHEST 1 VIEW COMPARISON:  02/01/2015 and 01/31/2015. FINDINGS: 1506 hours. Patient is rotated to the left. The endotracheal and nasogastric tubes appear unchanged. There is new right arm PICC with its tip inferior to the left mainstem bronchus, at the level of the lower SVC allowing for rotation. No evidence of pneumothorax. Bilateral pleural effusions and bibasilar airspace opacities are unchanged. IMPRESSION: PICC line tip at the lower SVC level. No pneumothorax or other significant changes. Electronically Signed   By: Richardean Sale M.D.   On: 02/01/2015 15:33   Dg Chest Port 1 View  02/01/2015  CLINICAL DATA:  Pericardial effusion. EXAM: PORTABLE CHEST 1 VIEW COMPARISON:  01/31/2015.  CT 08/24/2014 . FINDINGS: Endotracheal tube and NG tube in stable position. Stable cardiomegaly.  Prior CT revealed a small pericardial effusion. No pulmonary venous congestion. Diffuse bilateral pulmonary interstitial prominence. Previously identified density in the left AP window has resolved, this may have represented atelectasis. Small bilateral pleural effusions. No pneumothorax. IMPRESSION: 1. Lines and tubes in stable position. 2. Diffuse bilateral pulmonary interstitial prominence unchanged. Interstitial edema and/or pneumonitis could present in this fashion. Small bilateral pleural effusions. 3. Previously identified density in the AP window has resolved. This may have represented atelectasis. Electronically Signed   By: [place]   On: 02/01/2015 07:34   Dg Chest Port 1 View  01/31/2015  CLINICAL DATA:  Endotracheal tube placement. EXAM: PORTABLE CHEST 1 VIEW COMPARISON:  01/31/2015 at 1016 hours. FINDINGS: Endotracheal tube terminates approximately 2.9 cm above the carina. Nasogastric tube is followed into the stomach with the side port in the region of the gastroesophageal junction. There is prominence of the AP window and left hilum. Consolidation in the lingula obscures the left heart border.  There is interstitial prominence in the right hemi thorax. IMPRESSION: 1. Endotracheal tube is in satisfactory position. 2. Prominence of the AP window and left hilum, of uncertain etiology. New consolidation in the lingula. Consider further evaluation with CT chest with contrast, as clinically indicated. 3. Progressive interstitial prominence in the right lung, possibly due to edema. Electronically Signed   By: Lorin Picket M.D.   On: 01/31/2015 20:55   US Breast Ltd Uni Left Inc Axilla  01/26/2015  CLINICAL DATA:  69 year old female presenting for delayed six-month follow-up of a probably benign left breast mass. EXAM: DIGITAL DIAGNOSTIC BILATERAL MAMMOGRAM WITH 3D TOMOSYNTHESIS AND CAD LEFT BREAST ULTRASOUND COMPARISON:  Previous exam(s). ACR Breast Density Category b: There are scattered areas of fibroglandular density. FINDINGS: In the lower-inner quadrant of the left breast, there is a persistent oval mass measuring 5 mm on the MLO view. No new masses, areas of distortion or suspicious calcifications are seen in the breasts bilaterally. Mammographic images were processed with CAD. Physical exam targeted to the lower-inner quadrant of the left breast demonstrates no discrete palpable abnormality. Ultrasound targeted to the lower-inner quadrant of the left breast at 7 o'clock, 2 cm from the nipple demonstrates a hypoechoic superficial oval mass with indistinct margins measuring 9 x 3 x 4 mm. Previously, the mass measured 5 x 3 x 4 mm. Internal blood flow is documented within the mass. IMPRESSION: 1. Interval increase in size of the superficial mass at 7 o'clock. This may represent a papilloma or possibly a fibroadenoma. 2.  No evidence of malignancy in the right breast. RECOMMENDATION: Ultrasound-guided biopsy is recommended for the left breast mass at 7 o'clock which has increased in size. The patient states that she will need to call back for an appointment, and prefers to come in mid November. I have  discussed the findings and recommendations with the patient. Results were also provided in writing at the conclusion of the visit. If applicable, a reminder letter will be sent to the patient regarding the next appointment. BI-RADS CATEGORY  4: Suspicious. Electronically Signed   By: Ammie Ferrier M.D.   On: 01/26/2015 15:01   Mm Diag Breast Tomo Bilateral  01/26/2015  CLINICAL DATA:  69 year old female presenting for delayed six-month follow-up of a probably benign left breast mass. EXAM: DIGITAL DIAGNOSTIC BILATERAL MAMMOGRAM WITH 3D TOMOSYNTHESIS AND CAD LEFT BREAST ULTRASOUND COMPARISON:  Previous exam(s). ACR Breast Density Category b: There are scattered areas of fibroglandular density. FINDINGS: In the lower-inner quadrant of the left breast,  there is a persistent oval mass measuring 5 mm on the MLO view. No new masses, areas of distortion or suspicious calcifications are seen in the breasts bilaterally. Mammographic images were processed with CAD. Physical exam targeted to the lower-inner quadrant of the left breast demonstrates no discrete palpable abnormality. Ultrasound targeted to the lower-inner quadrant of the left breast at 7 o'clock, 2 cm from the nipple demonstrates a hypoechoic superficial oval mass with indistinct margins measuring 9 x 3 x 4 mm. Previously, the mass measured 5 x 3 x 4 mm. Internal blood flow is documented within the mass. IMPRESSION: 1. Interval increase in size of the superficial mass at 7 o'clock. This may represent a papilloma or possibly a fibroadenoma. 2.  No evidence of malignancy in the right breast. RECOMMENDATION: Ultrasound-guided biopsy is recommended for the left breast mass at 7 o'clock which has increased in size. The patient states that she will need to call back for an appointment, and prefers to come in mid November. I have discussed the findings and recommendations with the patient. Results were also provided in writing at the conclusion of the visit. If  applicable, a reminder letter will be sent to the patient regarding the next appointment. BI-RADS CATEGORY  4: Suspicious. Electronically Signed   By: Ammie Ferrier M.D.   On: 01/26/2015 15:01    Microbiology: Recent Results (from the past 240 hour(s))  C difficile quick scan w PCR reflex     Status: None   Collection Time: 02/06/15 10:41 PM  Result Value Ref Range Status   C Diff antigen NEGATIVE NEGATIVE Final   C Diff toxin NEGATIVE NEGATIVE Final   C Diff interpretation Negative for toxigenic C. difficile  Final     Labs: Basic Metabolic Panel:  Recent Labs Lab 02/12/15 0700 02/12/15 1546 02/13/15 0435 02/14/15 0545 02/15/15 0600 02/16/15 0523  NA 140  --  141 140 138 139  K 3.3*  --  3.3* 3.4* 4.9 3.1*  CL 102  --  102 103 101 101  CO2 31  --  31 30 30 29   GLUCOSE 217*  --  227* 181* 216* 216*  BUN 13  --  11 8 8 11   CREATININE 0.70  --  0.67 0.61 0.76 0.64  CALCIUM 8.6*  --  8.3* 8.4* 8.4* 8.6*  MG  --  1.8 1.6* 1.7 1.9 1.5*   Liver Function Tests:  Recent Labs Lab 02/13/15 0435  AST 31  ALT 17  ALKPHOS 81  BILITOT 0.6  PROT 6.9  ALBUMIN 2.3*   No results for input(s): LIPASE, AMYLASE in the last 168 hours. No results for input(s): AMMONIA in the last 168 hours. CBC:  Recent Labs Lab 02/12/15 0700 02/13/15 0435 02/14/15 0545 02/15/15 0600 02/16/15 0523  WBC 12.6* 10.9* 11.1* 10.2 10.3  NEUTROABS  --  7.4  --   --   --   HGB 10.8* 10.6* 11.1* 11.3* 11.0*  HCT 33.7* 33.8* 34.3* 35.3* 35.0*  MCV 88.9 89.9 88.6 88.9 89.5  PLT 686* 667* 614* 592* 576*   Cardiac Enzymes: No results for input(s): CKTOTAL, CKMB, CKMBINDEX, TROPONINI in the last 168 hours. BNP: BNP (last 3 results)  Recent Labs  02/01/15 1530  BNP 487.5*    ProBNP (last 3 results) No results for input(s): PROBNP in the last 8760 hours.  CBG:  Recent Labs Lab 02/15/15 1150 02/15/15 1530 02/15/15 2201 02/16/15 0744 02/16/15 1150  GLUCAP 153* 112* 216* 212* 210*  SignedFlorencia Reasons MD, PhD  [facility] 02/16/2015, 3:29 PM

## 2015-02-16 NOTE — Progress Notes (Signed)
Per pt RN, desaturation screen was done and pt qualified for home 02. MD order written for home 02. [facility] [employer] rep contacted for home 02 and alerted pt would be traveling to [place] with her daughter. [facility] HH rep also alerted that pt is to [place] today. They will follow her at [place].  No additional needs identified. Marney Doctor RN,BSN,NCM [phone]

## 2015-02-16 NOTE — Care Management Important Message (Signed)
Important Message  Patient Details  Name: Mercedes Thornton MRN: [id number] Date of Birth: [birth year]   Medicare Important Message Given:  Yes-fourth notification given    Camillo Flaming 02/16/2015, 11:10 AMImportant Message  Patient Details  Name: Mercedes Thornton MRN: [id number] Date of Birth: June 11, 1945   Medicare Important Message Given:  Yes-fourth notification given    Camillo Flaming 02/16/2015, 11:09 AM

## 2015-02-19 ENCOUNTER — Other Ambulatory Visit: Payer: Self-pay | Admitting: *Deleted

## 2015-02-19 ENCOUNTER — Telehealth: Payer: Self-pay | Admitting: Cardiology

## 2015-02-19 ENCOUNTER — Encounter: Payer: Self-pay | Admitting: Cardiology

## 2015-02-19 MED ORDER — METOPROLOL TARTRATE 25 MG PO TABS: 25.0000 mg | ORAL_TABLET | Freq: Two times a day (BID) | ORAL | Status: DC

## 2015-02-19 NOTE — Telephone Encounter (Signed)
Home health nurse called and said that Patient is in her daughters care in [place].  She didn't get her metoprolol from her Hospital discharge filled before leaving town.  Script sent to [facility] in [place]

## 2015-02-19 NOTE — Telephone Encounter (Signed)
New Message    Nurse calling stating that pt was given Metoprolol 25 mg 2x per day when in the hospital and is now out of this medication and had her last dosage on 02/16/15. Please call back and advise if pt is still suppose to be taking this medication.

## 2015-03-01 ENCOUNTER — Ambulatory Visit (INDEPENDENT_AMBULATORY_CARE_PROVIDER_SITE_OTHER): Payer: Medicare Other | Admitting: Adult Health

## 2015-03-01 ENCOUNTER — Encounter: Payer: Self-pay | Admitting: Adult Health

## 2015-03-01 ENCOUNTER — Ambulatory Visit (INDEPENDENT_AMBULATORY_CARE_PROVIDER_SITE_OTHER)
Admission: RE | Admit: 2015-03-01 | Discharge: 2015-03-01 | Disposition: A | Discharge status: Home / Self Care | Payer: Medicare Other | Source: Ambulatory Visit | Attending: Adult Health | Admitting: Adult Health

## 2015-03-01 VITALS — BP 120/60 | HR 70 | Temp 98.4°F | Ht 65.0 in | Wt 171.0 lb

## 2015-03-01 DIAGNOSIS — Z720 Tobacco use: Secondary | ICD-10-CM | POA: Diagnosis not present

## 2015-03-01 DIAGNOSIS — R911 Solitary pulmonary nodule: Secondary | ICD-10-CM

## 2015-03-01 DIAGNOSIS — K352 Acute appendicitis with generalized peritonitis: Secondary | ICD-10-CM

## 2015-03-01 DIAGNOSIS — R0602 Shortness of breath: Secondary | ICD-10-CM

## 2015-03-01 DIAGNOSIS — J9601 Acute respiratory failure with hypoxia: Secondary | ICD-10-CM | POA: Diagnosis not present

## 2015-03-01 DIAGNOSIS — K3532 Acute appendicitis with perforation and localized peritonitis, without abscess: Secondary | ICD-10-CM

## 2015-03-01 DIAGNOSIS — F17200 Nicotine dependence, unspecified, uncomplicated: Secondary | ICD-10-CM

## 2015-03-01 NOTE — Patient Instructions (Addendum)
Chest xray today .  Great job on not smoking.  Set up overnight oximetry test .  follow up Dr. Lake Bells in 4 weeks with PFT  Please contact office for sooner follow up if symptoms do not improve or worsen or seek emergency care

## 2015-03-05 ENCOUNTER — Telehealth: Payer: Self-pay | Admitting: Adult Health

## 2015-03-05 NOTE — Progress Notes (Signed)
Quick Note:  Called and spoke to pt. Informed her of the results and recs per TP. Appt and PFT made for 12/29. Pt verbalized understanding and denied any further questions or concerns at this time.   ______

## 2015-03-05 NOTE — Telephone Encounter (Signed)
Called spoke with pt. She is aware her DME will do the ONO and will contact her. She needed nothing further

## 2015-03-11 NOTE — Assessment & Plan Note (Signed)
Smoking cessation  

## 2015-03-11 NOTE — Progress Notes (Signed)
   Subjective:    Patient ID: Mercedes Thornton, female    DOB: [birth year], 69 y.o.   MRN: [id number]  HPI 68 yo female former smoker (quite 01/31/15) with COPD seen for initial pulmonary consult during hospitalization for ruptured appendix.  Marland Kitchen   03/01/15 [facility] Follow up : COPD  Pt returns for a 2 week post hospital follow up . She was admitted last month for ruptured appendix . She required appendectomy, abscess drainage. Had septic shock that improved with pressors and abx. Her hospitalization was complicated by AFib with RVR. She was treated with cardizem and amiodarone.  Noted pericardial effusion on echo w/ OP follow up with cards. 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 Rec for medical management. She did have some difficulty with acute resp failure and pulmonary edema that prolonged vent support . She improved with diuresis and was extubated on 10/24.  She is feeling better but has some lingering DOE and cough . Cough and congestion are getting better but has some lingering clear to tan mucus.  Still feels weak. Low energy . She is on ACE and Metoprolol.  She has not smoked since discharge. On albuterol PTA. Not on controller for COPD.     Review of Systems Constitutional:   No  weight loss, night sweats,  Fevers, chills,  +fatigue, or  lassitude.  HEENT:   No headaches,  Difficulty swallowing,  Tooth/dental problems, or  Sore throat,                No sneezing, itching, ear ache, nasal congestion, post nasal drip,   CV:  No chest pain,  Orthopnea, PND, swelling in lower extremities, anasarca, dizziness, palpitations, syncope.   GI  No heartburn, indigestion, abdominal pain, nausea, vomiting, diarrhea, change in bowel habits, loss of appetite, bloody stools.   Resp:   No chest wall deformity  Skin: no rash or lesions.  GU: no dysuria, change in color of urine, no urgency or frequency.  No flank pain, no hematuria   MS:  No joint pain or swelling.  No decreased range  of motion.  No back pain.  Psych:  No change in mood or affect. No depression or anxiety.  No memory loss.         Objective:   Physical Exam GEN: A/Ox3; pleasant , NAD, elderly   HEENT:  /AT,  EACs-clear, TMs-wnl, NOSE-clear, THROAT-clear, no lesions, no postnasal drip or exudate noted.   NECK:  Supple w/ fair ROM; no JVD; normal carotid impulses w/o bruits; no thyromegaly or nodules palpated; no lymphadenopathy.  RESP  Clear  P & A; w/o, wheezes/ rales/ or rhonchi.no accessory muscle use, no dullness to percussion  CARD:  RRR, no m/r/g  , no peripheral edema, pulses intact, no cyanosis or clubbing.  GI:   Soft & nt; nml bowel sounds; no organomegaly or masses detected.  Musco: Warm bil, no deformities or joint swelling noted.   Neuro: alert, no focal deficits noted.    Skin: Warm, no lesions or rashes         Assessment & Plan:

## 2015-03-11 NOTE — Assessment & Plan Note (Signed)
Post op doing well , follow upwith surgery as planned

## 2015-03-11 NOTE — Assessment & Plan Note (Signed)
SOB is improving post discharge after acute illness with ruptured appendix /septic shock  May have some underlying COPD with smoking hx  Need PFT. Check ONO for nocturnal desats.  Smoking cessation encouraged.   Plan  Chest xray today .  Great job on not smoking.  Set up overnight oximetry test .  follow up Dr. Lake Bells in 4 weeks with PFT  Please contact office for sooner follow up if symptoms do not improve or worsen or seek emergency care

## 2015-03-15 ENCOUNTER — Telehealth: Payer: Self-pay | Admitting: Adult Health

## 2015-03-15 NOTE — Telephone Encounter (Signed)
Per order comments on 11/17 it states ONO on RA.  Called spoke w/ pt and made her aware not to use O2 when ONO is being done. Nothing further needed

## 2015-03-27 ENCOUNTER — Encounter: Payer: Self-pay | Admitting: Adult Health

## 2015-03-27 ENCOUNTER — Telehealth: Payer: Self-pay | Admitting: Adult Health

## 2015-03-27 DIAGNOSIS — J9620 Acute and chronic respiratory failure, unspecified whether with hypoxia or hypercapnia: Secondary | ICD-10-CM

## 2015-03-27 DIAGNOSIS — J9610 Chronic respiratory failure, unspecified whether with hypoxia or hypercapnia: Secondary | ICD-10-CM | POA: Insufficient documentation

## 2015-03-27 NOTE — Telephone Encounter (Signed)
Per TP after reviewing ONO on RA New O2 start at 2L QHS Positive desats Begin O2 Follow up with PFT as planned  LVM for patient to return call

## 2015-03-28 NOTE — Telephone Encounter (Signed)
Patient notified of ONO results. Order entered for O2. Nothing further needed. Closing encounter

## 2015-04-11 ENCOUNTER — Other Ambulatory Visit: Payer: Self-pay

## 2015-04-11 ENCOUNTER — Ambulatory Visit (HOSPITAL_COMMUNITY): Payer: Medicare Other | Attending: Cardiovascular Disease

## 2015-04-11 DIAGNOSIS — I517 Cardiomegaly: Secondary | ICD-10-CM | POA: Diagnosis not present

## 2015-04-11 DIAGNOSIS — I313 Pericardial effusion (noninflammatory): Secondary | ICD-10-CM | POA: Diagnosis not present

## 2015-04-11 DIAGNOSIS — I3139 Other pericardial effusion (noninflammatory): Secondary | ICD-10-CM

## 2015-04-11 DIAGNOSIS — I319 Disease of pericardium, unspecified: Secondary | ICD-10-CM | POA: Diagnosis present

## 2015-04-12 ENCOUNTER — Telehealth: Payer: Self-pay | Admitting: Adult Health

## 2015-04-12 ENCOUNTER — Ambulatory Visit: Payer: Medicare Other | Admitting: Adult Health

## 2015-04-12 NOTE — Telephone Encounter (Signed)
Called spoke with pt. She wanted her ONO results again. Advised her of previous phone note. Pt to use 2 liters O2 qhs. Nothing further needed

## 2015-04-17 ENCOUNTER — Ambulatory Visit (INDEPENDENT_AMBULATORY_CARE_PROVIDER_SITE_OTHER): Payer: Medicare Other | Admitting: Pulmonary Disease

## 2015-04-17 ENCOUNTER — Encounter: Payer: Self-pay | Admitting: Adult Health

## 2015-04-17 ENCOUNTER — Ambulatory Visit (INDEPENDENT_AMBULATORY_CARE_PROVIDER_SITE_OTHER): Payer: Medicare Other | Admitting: Adult Health

## 2015-04-17 VITALS — BP 116/82 | HR 70 | Temp 97.8°F | Ht 65.0 in | Wt 177.0 lb

## 2015-04-17 DIAGNOSIS — R0902 Hypoxemia: Secondary | ICD-10-CM

## 2015-04-17 DIAGNOSIS — R0683 Snoring: Secondary | ICD-10-CM

## 2015-04-17 DIAGNOSIS — R5383 Other fatigue: Secondary | ICD-10-CM | POA: Diagnosis not present

## 2015-04-17 DIAGNOSIS — J9611 Chronic respiratory failure with hypoxia: Secondary | ICD-10-CM | POA: Diagnosis not present

## 2015-04-17 DIAGNOSIS — R0602 Shortness of breath: Secondary | ICD-10-CM

## 2015-04-17 LAB — PULMONARY FUNCTION TEST
FEF 25-75 PRE: 0.92 L/s
FEF2575-%PRED-PRE: 51 %
FEV1-%Pred-Pre: 71 %
FEV1-Pre: 1.39 L
FEV1FVC-%PRED-PRE: 93 %
FEV6-%Pred-Pre: 78 %
FEV6-Pre: 1.89 L
FEV6FVC-%Pred-Pre: 104 %
FVC-%Pred-Pre: 75 %
FVC-Pre: 1.89 L
PRE FEV1/FVC RATIO: 73 %
PRE FEV6/FVC RATIO: 100 %

## 2015-04-17 NOTE — Progress Notes (Signed)
Subjective:    Patient ID: Mercedes Thornton, female    DOB: 12-Apr-1946, 70 y.o.   MRN: [id number]  HPI 70 yo female former smoker (quite 01/31/15) with COPD seen for initial pulmonary consult during hospitalization for ruptured appendix.  .   04/17/2015 Follow up : COPD  Returns for 6 week follow up .  Was admitted Nov for  ruptured appendix . She required appendectomy, abscess drainage. Had septic shock that improved with pressors and abx. Her hospitalization was complicated by AFib with RVR. She was treated with cardizem and amiodarone.  Noted pericardial effusion on echo w/ OP follow up with cards. 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 Rec for medical management. She did have some difficulty with acute resp failure and pulmonary edema that prolonged vent support . She improved with diuresis and was extubated on 10/24.  . She is on ACE and Metoprolol. Denies cough or wheezing  She has not smoked since discharge.  Had PFT today but unfortunately was unable to complete. She could not corrdinate breaths for test.  Has proair but uses rarely  Says her breathing is better.  CXR last ov with COPD changes  ONO showed + desats off O2 , O2 was continued. Discussed possible sleep apnea.  She sleeps a lot during daytime.Marland Kitchen Unsure about snoring . She agrees to having sleep study .  Denies chest pain orthopnea, edema or fever. Had echo 12/28 that showed moderate free flowing pericardial effusion. Not c/w tamponade.    Review of Systems Constitutional:   No  weight loss, night sweats,  Fevers, chills,  +fatigue, or  lassitude.  HEENT:   No headaches,  Difficulty swallowing,  Tooth/dental problems, or  Sore throat,                No sneezing, itching, ear ache, nasal congestion, post nasal drip,   CV:  No chest pain,  Orthopnea, PND, swelling in lower extremities, anasarca, dizziness, palpitations, syncope.   GI  No heartburn, indigestion, abdominal pain, nausea, vomiting, diarrhea, change in  bowel habits, loss of appetite, bloody stools.   Resp:   No chest wall deformity  Skin: no rash or lesions.  GU: no dysuria, change in color of urine, no urgency or frequency.  No flank pain, no hematuria   MS:  No joint pain or swelling.  No decreased range of motion.  No back pain.  Psych:  No change in mood or affect. No depression or anxiety.  No memory loss.         Objective:   Physical Exam   Filed Vitals:   04/17/15 1607  BP: 116/82  Pulse: 70  Temp: 97.8 F (36.6 C)  TempSrc: Oral  Height: 5\' 5"  (1.651 m)  Weight: 177 lb (80.287 kg)  SpO2: 92%    GEN: A/Ox3; pleasant , NAD, elderly   HEENT:  /AT,  EACs-clear, TMs-wnl, NOSE-clear, THROAT-clear, no lesions, no postnasal drip or exudate noted.   NECK:  Supple w/ fair ROM; no JVD; normal carotid impulses w/o bruits; no thyromegaly or nodules palpated; no lymphadenopathy.  RESP  Clear  P & A; w/o, wheezes/ rales/ or rhonchi.no accessory muscle use, no dullness to percussion  CARD:  RRR, no m/r/g  , no peripheral edema, pulses intact, no cyanosis or clubbing.  GI:   Soft & nt; nml bowel sounds; no organomegaly or masses detected.  Musco: Warm bil, no deformities or joint swelling noted.   Neuro: alert, no focal deficits  noted.    Skin: Warm, no lesions or rashes         Assessment & Plan:

## 2015-04-17 NOTE — Patient Instructions (Addendum)
Follow up Dr. Lake Bells in 3 months and As needed   Continue on Oxygen At bedtime  .  We are setting you up for a sleep study .  Please contact office for sooner follow up if symptoms do not improve or worsen or seek emergency care

## 2015-04-17 NOTE — Progress Notes (Signed)
Pt unable to follow directions. Spirometry and DLCO were attempted without success. Patient unable to perform test.

## 2015-04-20 ENCOUNTER — Telehealth: Payer: Self-pay | Admitting: Cardiology

## 2015-04-20 NOTE — Telephone Encounter (Signed)
New Message  Pt called states that she was given the results. Pt request a call back for clarity. Please call.

## 2015-04-20 NOTE — Telephone Encounter (Signed)
Reviewed ECHO results. Patient has no further questions.

## 2015-04-23 NOTE — Assessment & Plan Note (Signed)
This has improved post discharge.  Suspect she has underlying COPD with smoking hx but denies active symptoms  Was unable to complete PFT  today ? Reason /coordination  Consider spirometry on return if pt able .  Use proair As needed   Smoking cessation is key.

## 2015-04-23 NOTE — Assessment & Plan Note (Signed)
Nocturnal desats, cont on O2  Check sleep study ? OSA  Plan  Follow up Dr. Lake Bells in 3 months and As needed   Continue on Oxygen At bedtime  .  We are setting you up for a sleep study .  Please contact office for sooner follow up if symptoms do not improve or worsen or seek emergency care

## 2015-04-25 ENCOUNTER — Ambulatory Visit (INDEPENDENT_AMBULATORY_CARE_PROVIDER_SITE_OTHER): Payer: Medicare Other | Admitting: Cardiology

## 2015-04-25 ENCOUNTER — Encounter: Payer: Self-pay | Admitting: Cardiology

## 2015-04-25 ENCOUNTER — Ambulatory Visit (INDEPENDENT_AMBULATORY_CARE_PROVIDER_SITE_OTHER): Payer: Medicare Other

## 2015-04-25 VITALS — BP 120/60 | HR 60 | Ht 65.0 in | Wt 172.0 lb

## 2015-04-25 DIAGNOSIS — I9789 Other postprocedural complications and disorders of the circulatory system, not elsewhere classified: Secondary | ICD-10-CM | POA: Insufficient documentation

## 2015-04-25 DIAGNOSIS — I2510 Atherosclerotic heart disease of native coronary artery without angina pectoris: Secondary | ICD-10-CM | POA: Diagnosis not present

## 2015-04-25 DIAGNOSIS — I319 Disease of pericardium, unspecified: Secondary | ICD-10-CM | POA: Diagnosis not present

## 2015-04-25 DIAGNOSIS — I3139 Other pericardial effusion (noninflammatory): Secondary | ICD-10-CM | POA: Insufficient documentation

## 2015-04-25 DIAGNOSIS — I2583 Coronary atherosclerosis due to lipid rich plaque: Secondary | ICD-10-CM

## 2015-04-25 DIAGNOSIS — I480 Paroxysmal atrial fibrillation: Secondary | ICD-10-CM

## 2015-04-25 DIAGNOSIS — I313 Pericardial effusion (noninflammatory): Secondary | ICD-10-CM

## 2015-04-25 DIAGNOSIS — E785 Hyperlipidemia, unspecified: Secondary | ICD-10-CM

## 2015-04-25 DIAGNOSIS — I4891 Unspecified atrial fibrillation: Secondary | ICD-10-CM | POA: Insufficient documentation

## 2015-04-25 LAB — HEPATIC FUNCTION PANEL
ALT: 16 U/L (ref 6–29)
AST: 20 U/L (ref 10–35)
Albumin: 3.8 g/dL (ref 3.6–5.1)
Alkaline Phosphatase: 66 U/L (ref 33–130)
BILIRUBIN DIRECT: 0.1 mg/dL (ref <=0.2)
BILIRUBIN TOTAL: 0.6 mg/dL (ref 0.2–1.2)
Indirect Bilirubin: 0.5 mg/dL (ref 0.2–1.2)
Total Protein: 7.9 g/dL (ref 6.1–8.1)

## 2015-04-25 LAB — BASIC METABOLIC PANEL
BUN: 18 mg/dL (ref 7–25)
CO2: 21 mmol/L (ref 20–31)
CREATININE: 0.74 mg/dL (ref 0.50–0.99)
Calcium: 9.8 mg/dL (ref 8.6–10.4)
Chloride: 103 mmol/L (ref 98–110)
Glucose, Bld: 196 mg/dL — ABNORMAL HIGH (ref 65–99)
Potassium: 3.9 mmol/L (ref 3.5–5.3)
Sodium: 136 mmol/L (ref 135–146)

## 2015-04-25 LAB — LIPID PANEL
CHOLESTEROL: 179 mg/dL (ref 125–200)
HDL: 59 mg/dL (ref >=46)
LDL Cholesterol: 92 mg/dL (ref <130)
TRIGLYCERIDES: 140 mg/dL (ref <150)
Total CHOL/HDL Ratio: 3 Ratio (ref <=5.0)
VLDL: 28 mg/dL (ref <30)

## 2015-04-25 NOTE — Patient Instructions (Signed)
Medication Instructions:  Your physician recommends that you continue on your current medications as directed. Please refer to the Current Medication list given to you today.   Labwork: TODAY: BMET, LFTs. Lipids  Testing/Procedures: Your physician has recommended that you wear an event monitor. Event monitors are medical devices that record the heart's electrical activity. Doctors most often [place] these monitors to diagnose arrhythmias. Arrhythmias are problems with the speed or rhythm of the heartbeat. The monitor is a small, portable device. You can wear one while you do your normal daily activities. This is usually used to diagnose what is causing palpitations/syncope (passing out).  Follow-Up: Your physician wants you to follow-up in: 6 months with Dr. Radford Pax. You will receive a reminder letter in the mail two months in advance. If you don't receive a letter, please call our office to schedule the follow-up appointment.   Any Other Special Instructions Will Be Listed Below (If Applicable).     If you need a refill on your cardiac medications before your next appointment, please call your pharmacy.

## 2015-04-25 NOTE — Progress Notes (Signed)
Cardiology Office Note   Date:  04/25/2015   ID:  Mercedes Thornton, DOB 06-28-1945, MRN [id number]  PCP:  Antonietta Jewel, MD    Chief Complaint  Patient presents with  . Coronary Artery Disease  . Hyperlipidemia      History of Present Illness: Mercedes Thornton is a 70 y.o. female who presents for hospital followup.  She has a history of ongoing tobacco use, COPD, DM, dyslipidemia, pericardia effusion and bipolar disorder.  She presented back in 02/2015 with N/V and abdominal pain and was diagnosed with acute ruptured appendicitis with pelvic abscess.  CT showed persistence of pericardial effusion.  She underwent laparoscopic appendectomy but was complicated by respiratory failure with sepsis.  She went into post op afib and acute diastolic CHF and NSVT.  Troponin was elevated and nuclear stress test was done that was abnormal.  She underwent cardiac cath 11/4 revealing 50% RCA and is now on medical management.  She is now here for cardiac followup.  She is doing well.  She denies any chest pain, dizziness, palpitations or syncope.  SHe has some mild DOE with extreme exertion.  She has some mild LE edema in feet that has been occurring for years.  She denies any claudication.    Past Medical History  Diagnosis Date  . Leukocytosis   . COPD (chronic obstructive pulmonary disease) ([place])   . Allergic rhinitis   . DM (diabetes mellitus) ([place])   . Bipolar affective disorder ([place])   . Hyperlipidemia   . Smoking   . Cough 10/30/2011  . SOB (shortness of breath) 10/30/2011  . Mediastinal adenopathy 11/26/2011  . Coronary artery disease 02/2015    50% RCA otherwise normal  . Postoperative atrial fibrillation ([place])   . Pericardial effusion     Past Surgical History  Procedure Laterality Date  . Cataract extraction    . Cesarean section    . Colonoscopy  2008    negative per pt's report, with [facility]   . Laparoscopic appendectomy N/A 01/31/2015    Procedure: LYSIS OF  ADHESIONS [place] Form LAPAROSCOPIC ;  Surgeon: Alphonsa Overall, MD;  Location: [facility];  Service: General;  Laterality: N/A;  . Cardiac catheterization N/A 02/15/2015    Procedure: Left Heart Cath and Coronary Angiography;  Surgeon: Leonie Man, MD;  Location: [facility];  Service: Cardiovascular;  Laterality: N/A;     Current Outpatient Prescriptions  Medication Sig Dispense Refill  . albuterol (PROVENTIL HFA;VENTOLIN HFA) 108 (90 BASE) MCG/ACT inhaler Inhale 2 puffs into the lungs every 6 (six) hours as needed for wheezing.    Marland Kitchen aspirin 81 MG tablet Take 81 mg by mouth daily.    . cetirizine (ZYRTEC) 10 MG tablet Take 10 mg by mouth daily.    . Cholecalciferol (VITAMIN D3) 3000 UNITS TABS Take 1 tablet by mouth daily.     . Cinnamon 500 MG TABS Take 1 tablet by mouth daily.     . clonazePAM (KLONOPIN) 1 MG tablet Take 1 tablet by mouth daily.    . fluticasone (FLONASE) 50 MCG/ACT nasal spray Place 1 spray into both nostrils daily as needed for allergies.     Marland Kitchen glimepiride (AMARYL) 4 MG tablet Take 1 tablet by mouth daily.    Marland Kitchen lisinopril (PRINIVIL,ZESTRIL) 5 MG tablet Take 0.5 tablets (2.5 mg total) by mouth daily. 30 tablet 0  . lovastatin (MEVACOR) 20 MG tablet  Take 20 mg by mouth at bedtime.     . metFORMIN (GLUCOPHAGE-XR) 500 MG 24 hr tablet Take 500 mg by mouth 2 (two) times daily.    . metoprolol tartrate (LOPRESSOR) 25 MG tablet Take 1 tablet (25 mg total) by mouth 2 (two) times daily. 60 tablet 2  . Multiple Vitamins-Minerals (WOMENS MULTIVITAMIN PLUS PO) Take 1 tablet by mouth daily.     . TURMERIC PO Take 1 tablet by mouth daily.    . vitamin C (ASCORBIC ACID) 500 MG tablet Take 500 mg by mouth daily.     No current facility-administered medications for this visit.    Allergies:   Penicillins    Social History:  The patient  reports that she quit smoking about 2 months ago. She has never used smokeless tobacco. She reports that she does not drink alcohol or use  illicit drugs.   Family History:  The patient's family history includes Cancer in her father; Stroke in her mother.    ROS:  Please see the history of present illness.   Otherwise, review of systems are positive for none.   All other systems are reviewed and negative.    PHYSICAL EXAM: VS:  BP 120/60 mmHg  Pulse 60  Ht 5\' 5"  (1.651 m)  Wt 172 lb (78.019 kg)  BMI 28.62 kg/m2 , BMI Body mass index is 28.62 kg/(m^2). GEN: Well nourished, well developed, in no acute distress HEENT: normal Neck: no JVD, carotid bruits, or masses Cardiac: RRR; no murmurs, rubs, or gallops,no edema  Respiratory:  clear to auscultation bilaterally, normal work of breathing GI: soft, nontender, nondistended, + BS MS: no deformity or atrophy Skin: warm and dry, no rash Neuro:  Strength and sensation are intact Psych: euthymic mood, full affect   EKG:  EKG is not ordered today.    Recent Labs: 02/01/2015: B Natriuretic Peptide 487.5* 02/13/2015: ALT 17; TSH 4.524* 02/16/2015: BUN 11; Creatinine, Ser 0.64; Hemoglobin 11.0*; Magnesium 1.5*; Platelets 576*; Potassium 3.1*; Sodium 139    Lipid Panel    Component Value Date/Time   CHOL 173 02/16/2015 0523   TRIG 204* 02/16/2015 0523   HDL 46 02/16/2015 0523   CHOLHDL 3.8 02/16/2015 0523   VLDL 41* 02/16/2015 0523   LDLCALC 86 02/16/2015 0523      Wt Readings from Last 3 Encounters:  04/25/15 172 lb (78.019 kg)  04/17/15 177 lb (80.287 kg)  03/01/15 171 lb (77.565 kg)    1.  Acute diastolic CHF in setting of acute illness and afib which is resolved. EF was normal on echo with no evidence of diastolic dysfunction.  -appears euvolemic on exam today. 2.  Post op atrial fibrillation maintaining NSR on BB.  No anticoagulation since this was first occurrence in setting of acute illness.  I will get a 30 day heart monitor to assess for silent afib.  I have asked her to let me know if she has any palpitations.   3.  Nonobstructive ASCAD with 50% RCA by  cath.  Continue ASA/BB/statin 4.  Elevated troponin on most recent hospitalization type II secondary to demand ischemia from sepsis and respiratory failure.  Cath as above.   5.  Dyslipidemia - continue statin 6.  Pericardial effusion - unchanged moderate effusion - I will take a look at her echo     Current medicines are reviewed at length with the patient today.  The patient does not have concerns regarding medicines.  The following changes have been made:  no change  Labs/ tests ordered today: See above Assessment and Plan No orders of the defined types were placed in this encounter.     Disposition:   FU with me in 6 months  Signed, Sueanne Margarita, MD  04/25/2015 9:34 AM    [facility] [place], [place], [place]  [address] Phone: [phone]; Fax: [phone]

## 2015-04-26 ENCOUNTER — Telehealth: Payer: Self-pay

## 2015-04-26 DIAGNOSIS — E785 Hyperlipidemia, unspecified: Secondary | ICD-10-CM

## 2015-04-26 MED ORDER — ATORVASTATIN CALCIUM 40 MG PO TABS: 40.0000 mg | ORAL_TABLET | Freq: Every day | ORAL | Status: DC

## 2015-04-26 NOTE — Telephone Encounter (Signed)
-----   Message from Sueanne Margarita, MD sent at 04/25/2015 11:12 PM EST ----- LDL not at goal - change Mevacor to lipitor 40mg  daily and recheck FLP and ALt in 6 weeks

## 2015-04-26 NOTE — Telephone Encounter (Signed)
Informed patient of results and verbal understanding expressed.  Instructed patient to STOP LOVASTATIN and START LIPITOR 40 mg daily. FLP and ALT scheduled for March 1. Patient agrees with treatment plan.

## 2015-05-10 ENCOUNTER — Encounter: Payer: Self-pay | Admitting: Adult Health

## 2015-05-22 ENCOUNTER — Ambulatory Visit (HOSPITAL_BASED_OUTPATIENT_CLINIC_OR_DEPARTMENT_OTHER): Payer: Medicare Other | Attending: Adult Health | Admitting: Radiology

## 2015-05-22 DIAGNOSIS — R0902 Hypoxemia: Secondary | ICD-10-CM

## 2015-05-22 DIAGNOSIS — R0683 Snoring: Secondary | ICD-10-CM | POA: Diagnosis not present

## 2015-05-22 DIAGNOSIS — E119 Type 2 diabetes mellitus without complications: Secondary | ICD-10-CM | POA: Diagnosis not present

## 2015-05-22 DIAGNOSIS — I493 Ventricular premature depolarization: Secondary | ICD-10-CM | POA: Insufficient documentation

## 2015-05-22 DIAGNOSIS — R5383 Other fatigue: Secondary | ICD-10-CM | POA: Diagnosis present

## 2015-05-22 DIAGNOSIS — G4734 Idiopathic sleep related nonobstructive alveolar hypoventilation: Secondary | ICD-10-CM | POA: Diagnosis not present

## 2015-05-28 DIAGNOSIS — G4734 Idiopathic sleep related nonobstructive alveolar hypoventilation: Secondary | ICD-10-CM

## 2015-05-28 NOTE — Progress Notes (Signed)
Patient Name: Mercedes Thornton, Mercedes Thornton Date: 05/22/2015 Gender: Female D.O.B: 10/08/1945 Age (years): 65 Referring Provider: Lynelle Smoke Parrett Height (inches): 65 Interpreting Physician: Chesley Mires MD, ABSM Weight (lbs): 175 RPSGT: Carolin Coy BMI: 29 MRN: [id number] Neck Size: 14.00  CLINICAL INFORMATION Sleep Study Type: NPSG Indication for sleep study: Diabetes, Snoring Epworth Sleepiness Score: 7  SLEEP STUDY TECHNIQUE As per the AASM Manual for the Scoring of Sleep and Associated Events v2.3 (April 2016) with a hypopnea requiring 4% desaturations. The channels recorded and monitored were frontal, central and occipital EEG, electrooculogram (EOG), submentalis EMG (chin), nasal and oral airflow, thoracic and abdominal wall motion, anterior tibialis EMG, snore microphone, electrocardiogram, and pulse oximetry.  MEDICATIONS Patient's medications include: ASPIRIN, METOPROLOL TARTRATE. Medications self-administered by patient during sleep study : Sleep medicine administered - None  SLEEP ARCHITECTURE The study was initiated at 11:07:33 PM and ended at 5:40:53 AM. Sleep onset time was 16.0 minutes and the sleep efficiency was 68.9%. The total sleep time was 270.9 minutes. Stage REM latency was 136.5 minutes. The patient spent 10.71% of the night in stage N1 sleep, 61.84% in stage N2 sleep, 0.00% in stage N3 and 27.46% in REM. Alpha intrusion was absent. Supine sleep was 80.25%.  RESPIRATORY PARAMETERS The overall apnea/hypopnea index (AHI) was 1.6 per hour. There were 1 total apneas, including 1 obstructive, 0 central and 0 mixed apneas. There were 6 hypopneas and 22 RERAs. The AHI during Stage REM sleep was 2.4 per hour. AHI while supine was 0.8 per hour. The mean oxygen saturation was 90.66%. The minimum SpO2 during sleep was 83.00%.  She has persistent oxygen desaturation in the absence of other respiratory events.  She was placed on 1 liter supplemental oxygen with improvement in  her oxygen saturation. Soft snoring was noted during this study.  CARDIAC DATA The 2 lead EKG demonstrated sinus rhythm. The mean heart rate was 67.67 beats per minute. Other EKG findings include: PVCs.  LEG MOVEMENT DATA The total PLMS were 0 with a resulting PLMS index of 0.00. Associated arousal with leg movement index was 0.0 .  IMPRESSIONS While she did have a few respiratory events, these were no frequent enough to qualify for a diagnosis of obstructive sleep apnea.  She did have persistent oxygen desaturation in the absence of other respiratory events.  This improved with the addition of 1 liter supplemental oxygen.  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  Chesley Mires, MD, [place], American Board of Sleep Medicine 05/28/2015, 12:00 PM  NPI: [id number]

## 2015-05-29 ENCOUNTER — Other Ambulatory Visit: Payer: Self-pay | Admitting: Cardiology

## 2015-06-11 ENCOUNTER — Telehealth: Payer: Self-pay | Admitting: Pulmonary Disease

## 2015-06-11 DIAGNOSIS — R0902 Hypoxemia: Secondary | ICD-10-CM

## 2015-06-11 NOTE — Telephone Encounter (Signed)
Pt requesting sleep study results-sleep study in Epic.   Pt also requesting nighttime 02 system that she can travel with.  Pt states her 02 is very limiting and her tanks are difficult to carry with her.  DME: [facility].    BQ please advise.  Thanks!   Please call pt at [phone]

## 2015-06-12 NOTE — Telephone Encounter (Signed)
Pt returning call for results.Mercedes Thornton ° °

## 2015-06-12 NOTE — Telephone Encounter (Signed)
She does not have sleep Apnea but she does need to use oxygen at night. We can prescribe a portable oxygen concentrator for her for travel.

## 2015-06-12 NOTE — Telephone Encounter (Signed)
LVM for pt to return call

## 2015-06-12 NOTE — Telephone Encounter (Signed)
Spoke with pt, aware of recs.  Order placed for poc to [facility].  Nothing further needed.

## 2015-06-13 ENCOUNTER — Telehealth: Payer: Self-pay

## 2015-06-13 ENCOUNTER — Other Ambulatory Visit (INDEPENDENT_AMBULATORY_CARE_PROVIDER_SITE_OTHER): Payer: Medicare Other | Admitting: *Deleted

## 2015-06-13 DIAGNOSIS — E1149 Type 2 diabetes mellitus with other diabetic neurological complication: Principal | ICD-10-CM

## 2015-06-13 DIAGNOSIS — E785 Hyperlipidemia, unspecified: Secondary | ICD-10-CM

## 2015-06-13 DIAGNOSIS — E1140 Type 2 diabetes mellitus with diabetic neuropathy, unspecified: Secondary | ICD-10-CM

## 2015-06-13 LAB — COMPREHENSIVE METABOLIC PANEL
ALT: 12 U/L (ref 6–29)
AST: 13 U/L (ref 10–35)
Albumin: 3.8 g/dL (ref 3.6–5.1)
Alkaline Phosphatase: 73 U/L (ref 33–130)
BILIRUBIN TOTAL: 0.5 mg/dL (ref 0.2–1.2)
BUN: 13 mg/dL (ref 7–25)
CHLORIDE: 104 mmol/L (ref 98–110)
CO2: 24 mmol/L (ref 20–31)
CREATININE: 0.75 mg/dL (ref 0.50–0.99)
Calcium: 9.7 mg/dL (ref 8.6–10.4)
Glucose, Bld: 219 mg/dL — ABNORMAL HIGH (ref 65–99)
Potassium: 4.2 mmol/L (ref 3.5–5.3)
SODIUM: 140 mmol/L (ref 135–146)
TOTAL PROTEIN: 7.3 g/dL (ref 6.1–8.1)

## 2015-06-13 LAB — CBC WITH DIFFERENTIAL/PLATELET
BASOS ABS: 0.1 10*3/uL (ref 0.0–0.1)
Basophils Relative: 1 % (ref 0–1)
EOS ABS: 0.3 10*3/uL (ref 0.0–0.7)
EOS PCT: 2 % (ref 0–5)
HEMATOCRIT: 40.5 % (ref 36.0–46.0)
Hemoglobin: 13.7 g/dL (ref 12.0–15.0)
LYMPHS PCT: 21 % (ref 12–46)
Lymphs Abs: 2.8 10*3/uL (ref 0.7–4.0)
MCH: 28.5 pg (ref 26.0–34.0)
MCHC: 33.8 g/dL (ref 30.0–36.0)
MCV: 84.4 fL (ref 78.0–100.0)
MONO ABS: 1.1 10*3/uL — AB (ref 0.1–1.0)
MPV: 10.3 fL (ref 8.6–12.4)
Monocytes Relative: 8 % (ref 3–12)
Neutro Abs: 9.1 10*3/uL — ABNORMAL HIGH (ref 1.7–7.7)
Neutrophils Relative %: 68 % (ref 43–77)
PLATELETS: 357 10*3/uL (ref 150–400)
RBC: 4.8 MIL/uL (ref 3.87–5.11)
RDW: 15.2 % (ref 11.5–15.5)
WBC: 13.4 10*3/uL — AB (ref 4.0–10.5)

## 2015-06-13 LAB — THYROID PANEL WITH TSH
Free Thyroxine Index: 2.5 (ref 1.4–3.8)
T3 Uptake: 33 % (ref 22–35)
T4, Total: 7.7 ug/dL (ref 4.5–12.0)
TSH: 1.79 m[IU]/L

## 2015-06-13 LAB — HEMOGLOBIN A1C
Hgb A1c MFr Bld: 8.3 % — ABNORMAL HIGH (ref <5.7)
MEAN PLASMA GLUCOSE: 192 mg/dL — AB (ref <117)

## 2015-06-13 LAB — ALT: ALT: 12 U/L (ref 6–29)

## 2015-06-13 LAB — LIPID PANEL
Cholesterol: 112 mg/dL — ABNORMAL LOW (ref 125–200)
HDL: 55 mg/dL (ref >=46)
LDL CALC: 39 mg/dL (ref <130)
Total CHOL/HDL Ratio: 2 Ratio (ref <=5.0)
Triglycerides: 88 mg/dL (ref <150)
VLDL: 18 mg/dL (ref <30)

## 2015-06-13 NOTE — Telephone Encounter (Signed)
Has she tried nicotine patch or Wellbutrin in the past

## 2015-06-13 NOTE — Telephone Encounter (Signed)
Patient said that she has been very successful quitting smoking after hospital discharge in [place] two weeks have been very difficult with her desire to smole She would like to have something to help her. She has unsuccessfully used Chantix in the past She asked about nicotine gum or something else.  Needs advice and if possible, a helpful medication  Please call her back today on her cell phone: [phone]  Also mentioned that she hasn't been seen in awhile and wonders if she needs an appointment scheduled.

## 2015-06-13 NOTE — Telephone Encounter (Signed)
Left message for patient that monitor results were released to MyChart 2/16 and that Dr. Radford Pax has not yet given recommendations from phone call this AM. Informed her she would be called tomorrow to review monitor results again and Dr. Theodosia Blender recommendations re: smoking cessation.

## 2015-06-13 NOTE — Telephone Encounter (Signed)
Pt called again,she is still waiting to hear something. She also would like her monitor results,it was sent back on 05-25-15.

## 2015-06-13 NOTE — Addendum Note (Signed)
Addended by: Eulis Foster on: 06/13/2015 11:02 AM   Modules accepted: Orders

## 2015-06-14 NOTE — Telephone Encounter (Signed)
Spoke with pt and she states she has not tried Wellbutrin in the past but states she is on Klonopin and doesn't think this will be safe. Pt tried patches in the past but states she only used the first step (21mg ) patches. Advised pt I will route message to Dr. Radford Pax for review and advisement on smoking cessation.

## 2015-06-14 NOTE — Telephone Encounter (Signed)
Informed patient of Dr. Theodosia Blender recommendations to see PCP to discuss Wellbutrin for smoking cessation. Patient understands this phone encounter will be sent to PCP.  Patient also concerned because she "has not been seen in months." Reminded patient she had an OV with Dr. Radford Pax April 25, 2015 and should be seen again in July or August for 6 month follow-up. Patient agrees with treatment plan.

## 2015-06-14 NOTE — Telephone Encounter (Signed)
I would like her to talk with her PCP to see if she would be willing to change her to Wellbutrin

## 2015-06-19 ENCOUNTER — Telehealth: Payer: Self-pay | Admitting: Pulmonary Disease

## 2015-06-19 NOTE — Telephone Encounter (Signed)
Called and spoke with pt. Pt states that [facility] called her and said that our office needs to send documentation stating there is a need for oxygen.   Called and spoke with Ailene Ravel at [facility]. She states that since she does not qualify for oxygen 24/7 and only at bedtime that she can rent a POC but insurance will not cover for her to have one to keep at home. She also informed me that the patient needs to give a 2 week notice if she needs a POC for travel and will need to pay out of pocket.   Called and spoke with pt. Informed her of the above. She voiced understanding and had no further questions. Nothing further needed.

## 2015-06-19 NOTE — Telephone Encounter (Signed)
Pt called back. She reports she was returning a call to nurse. Also wants to discuss smaller o2 tank.

## 2015-06-22 ENCOUNTER — Telehealth: Payer: Self-pay | Admitting: Pulmonary Disease

## 2015-06-22 NOTE — Telephone Encounter (Signed)
Called and spoke to pt. Pt states she contacted [facility] and was advised that [facility] needs another order for her to rent a POC for travel use, nocturnal use only. Called Melissa and was advised that nothing further is needed on our end. Pt will just need to contact her [facility] retail location and let them know she is wanting to join the travel program to rent a POC for travel. Called and spoke to pt. Informed her of the recs per Melissa. Pt verbalized understanding and denied any further questions or concerns at this time.

## 2015-07-31 ENCOUNTER — Encounter: Payer: Self-pay | Admitting: Pulmonary Disease

## 2015-07-31 ENCOUNTER — Telehealth: Payer: Self-pay | Admitting: Pulmonary Disease

## 2015-07-31 ENCOUNTER — Ambulatory Visit (INDEPENDENT_AMBULATORY_CARE_PROVIDER_SITE_OTHER): Payer: Medicare Other | Admitting: Pulmonary Disease

## 2015-07-31 VITALS — BP 118/64 | HR 79 | Ht 65.0 in | Wt 179.0 lb

## 2015-07-31 DIAGNOSIS — I2510 Atherosclerotic heart disease of native coronary artery without angina pectoris: Secondary | ICD-10-CM

## 2015-07-31 DIAGNOSIS — J432 Centrilobular emphysema: Secondary | ICD-10-CM

## 2015-07-31 DIAGNOSIS — J9611 Chronic respiratory failure with hypoxia: Secondary | ICD-10-CM | POA: Diagnosis not present

## 2015-07-31 DIAGNOSIS — F17200 Nicotine dependence, unspecified, uncomplicated: Secondary | ICD-10-CM

## 2015-07-31 DIAGNOSIS — Z720 Tobacco use: Secondary | ICD-10-CM | POA: Diagnosis not present

## 2015-07-31 NOTE — Assessment & Plan Note (Signed)
She has nocturnal hypoxemia which is due to her emphysema. Fortunately, her O2 saturation average when breathing on room air was 85% and not much lower than that. I told her today that patients with emphysema who have nocturnal hypoxemia have a theoretical risk of developing pulmonary hypertension over time. So think it's a good idea for her to sleep with oxygen at night but I don't think that she needs it if she goes out of town for 2 or 3 days.

## 2015-07-31 NOTE — Telephone Encounter (Signed)
LVM for pt to return call

## 2015-07-31 NOTE — Telephone Encounter (Signed)
I have spoke with Caryl Pina and BQ and BQ states he can check after clinic today.

## 2015-07-31 NOTE — Patient Instructions (Signed)
Stop smoking We will refer you to the low dose cancer screening program Continue using oxygen at night We will see you back in one year or sooner if needed

## 2015-07-31 NOTE — Assessment & Plan Note (Signed)
She continues to smoke. She was provided with information today as well as counseled on the importance of quitting smoking.

## 2015-07-31 NOTE — Assessment & Plan Note (Signed)
She has mild to moderate centrilobular emphysema but she does not have COPD based on her lung function testing. She does not have severe symptoms at this time. Today explained to her that ongoing tobacco use will cause worsening symptoms over time and will cause her to develop worsening hypoxemia likely. Right now she only needs when necessary albuterol. She is counseled today quit smoking at length. She is at increased risk for lung cancer so we will refer her to our lung cancer screening program.

## 2015-07-31 NOTE — Progress Notes (Signed)
Subjective:    Patient ID: Mercedes Thornton, female    DOB: 1945-08-27, 70 y.o.   MRN: [id number]  Synopsis: As of 07/31/2015 active smoker (has smoked 1 ppd since 19 years).  Unable to complete PFTs in January 2017 but   HPI Chief Complaint  Patient presents with  . Follow-up    pt c/o sinus congestion, pnd, prod cough with clear mucus.  states her seasonal allergies are worsening this.     Mercedes Thornton is here to see me for the first time in the office as she has seen Mercedes Edison NP many times prior.  She has smoked 1 ppd since age 66.  The longest period she has quit has been 4-5 months at a time.  Her psychiatrist treated her with Chantix which helped the first time, but didn't help the second time.  She has been hospitalized in the past for hypoxemia and a presumed diagnosis of COPD, but has never been able to complete PFTs.  She says that now she has no problems breathing.  She coughs some with the pollen that has been out lately, but she doesn't cough regularly.    She continues to use the oxygen when she sleeps at night, but she would prefer to not use it because it limits her traveling.   Past Medical History  Diagnosis Date  . Leukocytosis   . COPD (chronic obstructive pulmonary disease) ([place])   . Allergic rhinitis   . DM (diabetes mellitus) ([place])   . Bipolar affective disorder ([place])   . Hyperlipidemia   . Smoking   . Cough 10/30/2011  . SOB (shortness of breath) 10/30/2011  . Mediastinal adenopathy 11/26/2011  . Coronary artery disease 02/2015    50% RCA otherwise normal  . Postoperative atrial fibrillation ([place])   . Pericardial effusion       Review of Systems  Constitutional: Negative for fever, chills and fatigue.  HENT: Negative for mouth sores, nosebleeds and postnasal drip.   Respiratory: Positive for shortness of breath. Negative for cough and wheezing.   Cardiovascular: Negative for chest pain, palpitations and leg swelling.       Objective:   Physical  Exam  Filed Vitals:   07/31/15 1335  BP: 118/64  Pulse: 79  Height: 5\' 5"  (1.651 m)  Weight: 179 lb (81.194 kg)  SpO2: 97%  RA  Gen: well appearing HENT: OP clear, TM's clear, neck supple PULM: CTA B, normal percussion CV: RRR, no mgr, trace edema GI: BS+, soft, nontender Derm: no cyanosis or rash Psyche: normal mood and affect  CT images personally reviewed from 2016 > centrilobular emphysema  CBC    Component Value Date/Time   WBC 13.4* 06/13/2015 1102   WBC 14.9* 08/25/2014 1306   RBC 4.80 06/13/2015 1102   RBC 5.23 08/25/2014 1306   HGB 13.7 06/13/2015 1102   HGB 15.0 08/25/2014 1306   HCT 40.5 06/13/2015 1102   HCT 46.2 08/25/2014 1306   PLT 357 06/13/2015 1102   PLT 342 08/25/2014 1306   MCV 84.4 06/13/2015 1102   MCV 88.2 08/25/2014 1306   MCH 28.5 06/13/2015 1102   MCH 28.7 08/25/2014 1306   MCHC 33.8 06/13/2015 1102   MCHC 32.6 08/25/2014 1306   RDW 15.2 06/13/2015 1102   RDW 15.4* 08/25/2014 1306   LYMPHSABS 2.8 06/13/2015 1102   LYMPHSABS 2.1 08/25/2014 1306   MONOABS 1.1* 06/13/2015 1102   MONOABS 1.1* 08/25/2014 1306   EOSABS 0.3 06/13/2015 1102   EOSABS  0.3 08/25/2014 1306   BASOSABS 0.1 06/13/2015 1102   BASOSABS 0.1 08/25/2014 1306         Assessment & Plan:  Chronic respiratory failure ([place]) She has nocturnal hypoxemia which is due to her emphysema. Fortunately, her O2 saturation average when breathing on room air was 85% and not much lower than that. I told her today that patients with emphysema who have nocturnal hypoxemia have a theoretical risk of developing pulmonary hypertension over time. So think it's a good idea for her to sleep with oxygen at night but I don't think that she needs it if she goes out of town for 2 or 3 days.  Smoking She continues to smoke. She was provided with information today as well as counseled on the importance of quitting smoking.  Centrilobular emphysema ([place]) She has mild to moderate centrilobular  emphysema but she does not have COPD based on her lung function testing. She does not have severe symptoms at this time. Today explained to her that ongoing tobacco use will cause worsening symptoms over time and will cause her to develop worsening hypoxemia likely. Right now she only needs when necessary albuterol. She is counseled today quit smoking at length. She is at increased risk for lung cancer so we will refer her to our lung cancer screening program.     Current outpatient prescriptions:  .  albuterol (PROVENTIL HFA;VENTOLIN HFA) 108 (90 BASE) MCG/ACT inhaler, Inhale 2 puffs into the lungs every 6 (six) hours as needed for wheezing., Disp: , Rfl:  .  aspirin 81 MG tablet, Take 81 mg by mouth daily., Disp: , Rfl:  .  atorvastatin (LIPITOR) 40 MG tablet, Take 1 tablet (40 mg total) by mouth daily., Disp: 30 tablet, Rfl: 11 .  cetirizine (ZYRTEC) 10 MG tablet, Take 10 mg by mouth daily., Disp: , Rfl:  .  Cholecalciferol (VITAMIN D3) 3000 UNITS TABS, Take 1 tablet by mouth daily. , Disp: , Rfl:  .  Cinnamon 500 MG TABS, Take 1 tablet by mouth daily. , Disp: , Rfl:  .  clonazePAM (KLONOPIN) 1 MG tablet, Take 1 tablet by mouth daily., Disp: , Rfl:  .  fluticasone (FLONASE) 50 MCG/ACT nasal spray, Place 1 spray into both nostrils daily as needed for allergies. , Disp: , Rfl:  .  glimepiride (AMARYL) 4 MG tablet, Take 1 tablet by mouth daily., Disp: , Rfl:  .  lisinopril (PRINIVIL,ZESTRIL) 5 MG tablet, Take 0.5 tablets (2.5 mg total) by mouth daily., Disp: 30 tablet, Rfl: 0 .  metFORMIN (GLUCOPHAGE-XR) 500 MG 24 hr tablet, Take 500 mg by mouth 2 (two) times daily., Disp: , Rfl:  .  metoprolol tartrate (LOPRESSOR) 25 MG tablet, TAKE ONE TABLET BY MOUTH TWICE DAILY, Disp: 60 tablet, Rfl: 11 .  Multiple Vitamins-Minerals (WOMENS MULTIVITAMIN PLUS PO), Take 1 tablet by mouth daily. , Disp: , Rfl:  .  TURMERIC PO, Take 1 tablet by mouth daily., Disp: , Rfl:  .  vitamin C (ASCORBIC ACID) 500 MG  tablet, Take 500 mg by mouth daily., Disp: , Rfl:

## 2015-08-01 NOTE — Telephone Encounter (Signed)
Spoke with the pt  She is asking if any of her meds can cause hair loss  She has had problems with this ever since her last hospitalization  Please advise thanks

## 2015-08-01 NOTE — Telephone Encounter (Signed)
lmtcb x1 for pt. 

## 2015-08-01 NOTE — Telephone Encounter (Signed)
Pt retuning call.Mercedes Thornton ° °

## 2015-08-01 NOTE — Telephone Encounter (Signed)
I don't think so, but she may want to check with her pharmacist.

## 2015-08-02 NOTE — Telephone Encounter (Signed)
LMOMTCB x2 for pt 

## 2015-08-02 NOTE — Telephone Encounter (Signed)
Pt is aware of BQ's recommendation. Nothing further was needed at this time.

## 2015-08-09 ENCOUNTER — Telehealth: Payer: Self-pay | Admitting: Pulmonary Disease

## 2015-08-09 DIAGNOSIS — F17210 Nicotine dependence, cigarettes, uncomplicated: Secondary | ICD-10-CM

## 2015-08-09 NOTE — Telephone Encounter (Signed)
Routed to Lung Cancer Screening Pool.

## 2015-08-15 NOTE — Telephone Encounter (Signed)
LVM for pt to return call

## 2015-08-15 NOTE — Telephone Encounter (Signed)
LMTCB x 1 

## 2015-08-15 NOTE — Telephone Encounter (Signed)
Routing back to lung nodule clinic pool

## 2015-08-15 NOTE — Telephone Encounter (Signed)
[phone], pt cb

## 2015-08-15 NOTE — Telephone Encounter (Signed)
[phone], pt cb

## 2015-08-16 NOTE — Telephone Encounter (Signed)
Patient called and states she is returning Mercedes Thornton's call - prm

## 2015-08-16 NOTE — Telephone Encounter (Signed)
Routing to Lung Nodule.

## 2015-08-16 NOTE — Telephone Encounter (Signed)
[phone], pt cb

## 2015-08-17 NOTE — Telephone Encounter (Signed)
Patient returned call to Estill Bamberg, [phone] is CB.

## 2015-08-22 NOTE — Telephone Encounter (Signed)
LVM for pt to return call

## 2015-08-23 ENCOUNTER — Other Ambulatory Visit (HOSPITAL_BASED_OUTPATIENT_CLINIC_OR_DEPARTMENT_OTHER): Payer: Medicare Other

## 2015-08-23 ENCOUNTER — Ambulatory Visit (HOSPITAL_COMMUNITY)
Admission: RE | Admit: 2015-08-23 | Discharge: 2015-08-23 | Disposition: A | Discharge status: Home / Self Care | Payer: Medicare Other | Source: Ambulatory Visit | Attending: Hematology and Oncology | Admitting: Hematology and Oncology

## 2015-08-23 DIAGNOSIS — D72829 Elevated white blood cell count, unspecified: Secondary | ICD-10-CM

## 2015-08-23 DIAGNOSIS — R590 Localized enlarged lymph nodes: Secondary | ICD-10-CM | POA: Insufficient documentation

## 2015-08-23 DIAGNOSIS — R599 Enlarged lymph nodes, unspecified: Secondary | ICD-10-CM | POA: Insufficient documentation

## 2015-08-23 DIAGNOSIS — J432 Centrilobular emphysema: Secondary | ICD-10-CM | POA: Insufficient documentation

## 2015-08-23 DIAGNOSIS — R911 Solitary pulmonary nodule: Secondary | ICD-10-CM | POA: Insufficient documentation

## 2015-08-23 DIAGNOSIS — F17200 Nicotine dependence, unspecified, uncomplicated: Secondary | ICD-10-CM | POA: Insufficient documentation

## 2015-08-23 LAB — CBC WITH DIFFERENTIAL/PLATELET
BASO%: 0.5 % (ref 0.0–2.0)
BASOS ABS: 0.1 10*3/uL (ref 0.0–0.1)
EOS ABS: 0.2 10*3/uL (ref 0.0–0.5)
EOS%: 1.5 % (ref 0.0–7.0)
HCT: 42 % (ref 34.8–46.6)
HEMOGLOBIN: 13.8 g/dL (ref 11.6–15.9)
LYMPH%: 21.7 % (ref 14.0–49.7)
MCH: 29 pg (ref 25.1–34.0)
MCHC: 32.9 g/dL (ref 31.5–36.0)
MCV: 88.2 fL (ref 79.5–101.0)
MONO#: 1 10*3/uL — AB (ref 0.1–0.9)
MONO%: 6.7 % (ref 0.0–14.0)
NEUT%: 69.6 % (ref 38.4–76.8)
NEUTROS ABS: 10.1 10*3/uL — AB (ref 1.5–6.5)
PLATELETS: 262 10*3/uL (ref 145–400)
RBC: 4.76 10*6/uL (ref 3.70–5.45)
RDW: 15 % — AB (ref 11.2–14.5)
WBC: 14.5 10*3/uL — AB (ref 3.9–10.3)
lymph#: 3.1 10*3/uL (ref 0.9–3.3)

## 2015-08-23 LAB — COMPREHENSIVE METABOLIC PANEL
ALBUMIN: 3.6 g/dL (ref 3.5–5.0)
ALK PHOS: 89 U/L (ref 40–150)
ALT: 14 U/L (ref 0–55)
ANION GAP: 8 meq/L (ref 3–11)
AST: 13 U/L (ref 5–34)
BILIRUBIN TOTAL: 0.68 mg/dL (ref 0.20–1.20)
BUN: 9.8 mg/dL (ref 7.0–26.0)
CO2: 27 mEq/L (ref 22–29)
Calcium: 9.7 mg/dL (ref 8.4–10.4)
Chloride: 107 mEq/L (ref 98–109)
Creatinine: 0.8 mg/dL (ref 0.6–1.1)
EGFR: 86 mL/min/{1.73_m2} — AB (ref >90)
Glucose: 175 mg/dl — ABNORMAL HIGH (ref 70–140)
Potassium: 3.7 mEq/L (ref 3.5–5.1)
Sodium: 143 mEq/L (ref 136–145)
TOTAL PROTEIN: 8.2 g/dL (ref 6.4–8.3)

## 2015-08-23 NOTE — Telephone Encounter (Signed)
Patient returned Amanda's call. She can be reached is [phone].-prm

## 2015-08-24 ENCOUNTER — Encounter: Payer: Self-pay | Admitting: Hematology and Oncology

## 2015-08-24 ENCOUNTER — Ambulatory Visit (HOSPITAL_BASED_OUTPATIENT_CLINIC_OR_DEPARTMENT_OTHER): Payer: Medicare Other | Admitting: Hematology and Oncology

## 2015-08-24 VITALS — BP 131/67 | HR 70 | Temp 97.9°F | Resp 18 | Ht 65.0 in | Wt 181.9 lb

## 2015-08-24 DIAGNOSIS — J432 Centrilobular emphysema: Secondary | ICD-10-CM | POA: Diagnosis not present

## 2015-08-24 DIAGNOSIS — F17200 Nicotine dependence, unspecified, uncomplicated: Secondary | ICD-10-CM

## 2015-08-24 DIAGNOSIS — D72829 Elevated white blood cell count, unspecified: Secondary | ICD-10-CM | POA: Diagnosis present

## 2015-08-24 DIAGNOSIS — Z720 Tobacco use: Secondary | ICD-10-CM | POA: Diagnosis not present

## 2015-08-24 NOTE — Assessment & Plan Note (Signed)
Stable on CT chest, likely reactive Recommend smoking cessation She follows closely with pulmonologist

## 2015-08-24 NOTE — Progress Notes (Signed)
[name], MD SUMMARY OF HEMATOLOGIC HISTORY:  Reactive leukocytosis due to COPD and lung nodules SUMMARY OF HEMATOLOGIC HISTORY: This is a pleasant lady with chronic leukocytosis, thought to be related to her chronic bronchitis/COPD from smoking. Bone marrow biopsy from 2002 was negative. Due to smoking history, she had abnormal CT scan with lung nodules that is being followed. INTERVAL HISTORY: Mercedes Thornton 70 y.o. female returns for further follow-up. She continues to smoke but is attempting to quit. Denies recent infection. No new lymphadenopathy.   I have reviewed the past medical history, past surgical history, social history and family history with the patient and they are unchanged from previous note.  ALLERGIES:  is allergic to penicillins.  MEDICATIONS:  Current Outpatient Prescriptions  Medication Sig Dispense Refill  . albuterol (PROVENTIL HFA;VENTOLIN HFA) 108 (90 BASE) MCG/ACT inhaler Inhale 2 puffs into the lungs every 6 (six) hours as needed for wheezing.    Marland Kitchen aspirin 81 MG tablet Take 81 mg by mouth daily.    Marland Kitchen atorvastatin (LIPITOR) 40 MG tablet Take 1 tablet (40 mg total) by mouth daily. 30 tablet 11  . cetirizine (ZYRTEC) 10 MG tablet Take 10 mg by mouth daily.    . Cholecalciferol (VITAMIN D3) 3000 UNITS TABS Take 1 tablet by mouth daily.     . Cinnamon 500 MG TABS Take 1 tablet by mouth daily.     . clonazePAM (KLONOPIN) 1 MG tablet Take 1 tablet by mouth daily.    . fluticasone (FLONASE) 50 MCG/ACT nasal spray Place 1 spray into both nostrils daily as needed for allergies.     Marland Kitchen glimepiride (AMARYL) 4 MG tablet Take 1 tablet by mouth daily.    Marland Kitchen lisinopril (PRINIVIL,ZESTRIL) 5 MG tablet Take 0.5 tablets (2.5 mg total) by mouth daily. 30 tablet 0  . metFORMIN (GLUCOPHAGE-XR) 500 MG 24 hr tablet Take 500 mg by mouth 2 (two) times daily.    . metoprolol tartrate (LOPRESSOR) 25 MG tablet TAKE ONE TABLET BY MOUTH  TWICE DAILY 60 tablet 11  . Multiple Vitamins-Minerals (WOMENS MULTIVITAMIN PLUS PO) Take 1 tablet by mouth daily.     . TURMERIC PO Take 1 tablet by mouth daily.    . vitamin C (ASCORBIC ACID) 500 MG tablet Take 500 mg by mouth daily.     No current facility-administered medications for this visit.     REVIEW OF SYSTEMS:   Constitutional: Denies fevers, chills or night sweats Eyes: Denies blurriness of vision Ears, nose, mouth, throat, and face: Denies mucositis or sore throat Respiratory: Denies cough, dyspnea or wheezes Cardiovascular: Denies palpitation, chest discomfort or lower extremity swelling Gastrointestinal:  Denies nausea, heartburn or change in bowel habits Skin: Denies abnormal skin rashes Lymphatics: Denies new lymphadenopathy or easy bruising Neurological:Denies numbness, tingling or new weaknesses Behavioral/Psych: Mood is stable, no new changes  All other systems were reviewed with the patient and are negative.  PHYSICAL EXAMINATION: ECOG PERFORMANCE STATUS: 0 - Asymptomatic  Filed Vitals:   08/24/15 1318  BP: 131/67  Pulse: 70  Temp: 97.9 F (36.6 C)  Resp: 18   Filed Weights   08/24/15 1318  Weight: 181 lb 14.4 oz (82.509 kg)    GENERAL:alert, no distress and comfortable SKIN: skin color, texture, turgor are normal, no rashes or significant lesions EYES: normal, Conjunctiva are pink and non-injected, sclera clear Musculoskeletal:no cyanosis of digits and no clubbing  NEURO: alert & oriented x 3 with fluent speech, no focal  motor/sensory deficits  LABORATORY DATA:  I have reviewed the data as listed     Component Value Date/Time   NA 143 08/23/2015 1313   NA 140 06/13/2015 1102   K 3.7 08/23/2015 1313   K 4.2 06/13/2015 1102   CL 104 06/13/2015 1102   CL 105 06/02/2012 1140   CO2 27 08/23/2015 1313   CO2 24 06/13/2015 1102   GLUCOSE 175* 08/23/2015 1313   GLUCOSE 219* 06/13/2015 1102   GLUCOSE 89 06/02/2012 1140   BUN 9.8 08/23/2015 1313    BUN 13 06/13/2015 1102   CREATININE 0.8 08/23/2015 1313   CREATININE 0.75 06/13/2015 1102   CREATININE 0.64 02/16/2015 0523   CALCIUM 9.7 08/23/2015 1313   CALCIUM 9.7 06/13/2015 1102   PROT 8.2 08/23/2015 1313   PROT 7.3 06/13/2015 1102   ALBUMIN 3.6 08/23/2015 1313   ALBUMIN 3.8 06/13/2015 1102   AST 13 08/23/2015 1313   AST 13 06/13/2015 1102   ALT 14 08/23/2015 1313   ALT 12 06/13/2015 1102   ALKPHOS 89 08/23/2015 1313   ALKPHOS 73 06/13/2015 1102   BILITOT 0.68 08/23/2015 1313   BILITOT 0.5 06/13/2015 1102   GFRNONAA >60 02/16/2015 0523   GFRAA >60 02/16/2015 0523    No results found for: SPEP, UPEP  Lab Results  Component Value Date   WBC 14.5* 08/23/2015   NEUTROABS 10.1* 08/23/2015   HGB 13.8 08/23/2015   HCT 42.0 08/23/2015   MCV 88.2 08/23/2015   PLT 262 08/23/2015      Chemistry      Component Value Date/Time   NA 143 08/23/2015 1313   NA 140 06/13/2015 1102   K 3.7 08/23/2015 1313   K 4.2 06/13/2015 1102   CL 104 06/13/2015 1102   CL 105 06/02/2012 1140   CO2 27 08/23/2015 1313   CO2 24 06/13/2015 1102   BUN 9.8 08/23/2015 1313   BUN 13 06/13/2015 1102   CREATININE 0.8 08/23/2015 1313   CREATININE 0.75 06/13/2015 1102   CREATININE 0.64 02/16/2015 0523      Component Value Date/Time   CALCIUM 9.7 08/23/2015 1313   CALCIUM 9.7 06/13/2015 1102   ALKPHOS 89 08/23/2015 1313   ALKPHOS 73 06/13/2015 1102   AST 13 08/23/2015 1313   AST 13 06/13/2015 1102   ALT 14 08/23/2015 1313   ALT 12 06/13/2015 1102   BILITOT 0.68 08/23/2015 1313   BILITOT 0.5 06/13/2015 1102       RADIOGRAPHIC STUDIES: I have personally reviewed the radiological images as listed and agreed with the findings in the report. Ct Chest Wo Contrast  08/23/2015  CLINICAL DATA:  Lung nodules, smoker.  Mediastinal lymphadenopathy. EXAM: CT CHEST WITHOUT CONTRAST TECHNIQUE: Multidetector CT imaging of the chest was performed following the standard protocol without IV contrast.  COMPARISON:  CT 08/24/2014 FINDINGS: Mediastinum/Nodes: AP window node measures 13 mm short axis compared to 14 mm. Subcarinal lymph node measures 12 mm compared 12 mm remeasured. Small paratracheal lymph nodes are not changed in size. No axillary supraclavicular adenopathy. Lungs/Pleura: There is a mild reticular pattern within the upper lobes. There is centrilobular emphysema. No suspicious pulmonary nodules. The Upper abdomen: Limited view of the liver, kidneys, pancreas are unremarkable. Normal adrenal glands. Musculoskeletal: No aggressive osseous lesion. IMPRESSION: 1. Stable mild mediastinal lymphadenopathy. 2. Stable pulmonary parenchymal findings including centrilobular emphysema and subpleural pleural reticulation. No suspicious pulmonary nodules. Electronically Signed   By: Suzy Bouchard M.D.   On: 08/23/2015 15:15  ASSESSMENT & PLAN:  Leukocytosis The is chronic reactive to smoking/COPD She is not symptomatic. Observe only She does not need further work-up or follow-up    Smoking I spent some time counseling the patient the importance of tobacco cessation. she is currently attempting to quit on her own   Centrilobular emphysema ([place]) Stable on CT chest, likely reactive Recommend smoking cessation She follows closely with pulmonologist     All questions were answered. The patient knows to call the clinic with any problems, questions or concerns. No barriers to learning was detected.  I spent 10 minutes counseling the patient face to face. The total time spent in the appointment was 15 minutes and more than 50% was on counseling.     Araceli Arango, MD 5/12/20171:57 PM

## 2015-08-24 NOTE — Assessment & Plan Note (Signed)
I spent some time counseling the patient the importance of tobacco cessation. she is currently attempting to quit on her own 

## 2015-08-24 NOTE — Assessment & Plan Note (Signed)
The is chronic reactive to smoking/COPD She is not symptomatic. Observe only She does not need further work-up or follow-up

## 2015-08-29 NOTE — Telephone Encounter (Signed)
Called spoke with pt. Reviewed lung cancer screening program and pt does qualify. [facility] visit scheduled on 09/19/15. CT order has been placed. Pt voiced understanding and had no further questions. Nothing further needed.

## 2015-08-29 NOTE — Addendum Note (Signed)
Addended by: Osa Craver on: 08/29/2015 03:01 PM   Modules accepted: Orders

## 2015-09-19 ENCOUNTER — Ambulatory Visit (INDEPENDENT_AMBULATORY_CARE_PROVIDER_SITE_OTHER): Payer: Medicare Other | Admitting: Acute Care

## 2015-09-19 ENCOUNTER — Encounter: Payer: Self-pay | Admitting: Acute Care

## 2015-09-19 ENCOUNTER — Other Ambulatory Visit: Payer: Self-pay | Admitting: Acute Care

## 2015-09-19 DIAGNOSIS — F17210 Nicotine dependence, cigarettes, uncomplicated: Secondary | ICD-10-CM

## 2015-09-19 NOTE — Progress Notes (Signed)
Shared Decision Making Visit Lung Cancer Screening Program [phone])   Eligibility:  Age 70 y.o.  Pack Years Smoking History Calculation 51 pack year smoker (# packs/per year x # years smoked)  Recent History of coughing up blood  no  Unexplained weight loss? no ( >Than 15 pounds within the last 6 months )  Prior History Lung / other cancer no (Diagnosis within the last 5 years already requiring surveillance chest CT Scans).  Smoking Status Current Smoker  Former Smokers: Years since quit: NA; current smoker  Quit Date: NA  Visit Components:  Discussion included one or more decision making aids. yes  Discussion included risk/benefits of screening. yes  Discussion included potential follow up diagnostic testing for abnormal scans. yes  Discussion included meaning and risk of over diagnosis. yes  Discussion included meaning and risk of False Positives. yes  Discussion included meaning of total radiation exposure. yes  Counseling Included:  Importance of adherence to annual lung cancer LDCT screening. yes  Impact of comorbidities on ability to participate in the program. yes  Ability and willingness to under diagnostic treatment. yes  Smoking Cessation Counseling:  Current Smokers:   Discussed importance of smoking cessation. yes  Information about tobacco cessation classes and interventions provided to patient. yes  Patient provided with "ticket" for LDCT Scan. yes  Symptomatic Patient. no  Counseling   Diagnosis Code: Tobacco Use Z72.0  Asymptomatic Patient yes  Counseling (Intermediate counseling: > three minutes counseling) [id number]  Former Smokers:   Discussed the importance of maintaining cigarette abstinence. yes  Diagnosis Code: Personal History of Nicotine Dependence. [id number]  Information about tobacco cessation classes and interventions provided to patient. Yes  Patient provided with "ticket" for LDCT Scan. yes  Written Order for Lung Cancer  Screening with LDCT placed in Epic. Yes (CT Chest Lung Cancer Screening Low Dose W/O CM) [id number] Z12.2-Screening of respiratory organs Z87.891-Personal history of nicotine dependence  I have spent 20 minutes of face to face time with Ms. Brockway discussing the risks and benefits of lung cancer screening. We viewed a power point together that explained in detail the above noted topics. We paused at intervals to allow for questions to be asked and answered to ensure understanding.We discussed that the single most powerful action that she can take to decrease her risk of developing lung cancer is to quit smoking. We discussed whether or not she is ready to commit to setting a quit date.She is not ready to set a quit date, but is interested in attending the [facility]. We discussed options for tools to aid in quitting smoking including nicotine replacement therapy, non-nicotine medications, support groups, Quit Smart classes, and behavior modification. We discussed that often times setting smaller, more achievable goals, such as eliminating 1 cigarette a day for a week and then 2 cigarettes a day for a week can be helpful in slowly decreasing the number of cigarettes smoked. This allows for a sense of accomplishment as well as providing a clinical benefit. I gave her the " Be Stronger Than Your Excuses" card with contact information for community resources, classes, free nicotine replacement therapy, and access to mobile apps, text messaging, and on-line smoking cessation help. I have also given her my card and contact information in the event she needs to contact me. We discussed the time and location of the scan, and that either June Leap, CMA, or I will call with the results within 24-48 hours of receiving them. I have provided her with  a copy of the power point we viewed  as a resource in the event they need reinforcement of the concepts we discussed today in the office. The patient verbalized  understanding of all of  the above and had no further questions upon leaving the office. They have my contact information in the event they have any further questions.   Magdalen Spatz, NP 09/19/2015

## 2015-09-20 ENCOUNTER — Ambulatory Visit: Payer: Medicare Other

## 2015-12-24 ENCOUNTER — Other Ambulatory Visit: Payer: Self-pay | Admitting: Internal Medicine

## 2015-12-24 DIAGNOSIS — N6320 Unspecified lump in the left breast, unspecified quadrant: Secondary | ICD-10-CM

## 2016-02-12 ENCOUNTER — Other Ambulatory Visit: Payer: Self-pay | Admitting: Internal Medicine

## 2016-02-12 DIAGNOSIS — Z1231 Encounter for screening mammogram for malignant neoplasm of breast: Secondary | ICD-10-CM

## 2016-03-28 ENCOUNTER — Encounter: Payer: Self-pay | Admitting: Internal Medicine

## 2016-05-01 ENCOUNTER — Other Ambulatory Visit: Payer: Self-pay | Admitting: Cardiology

## 2016-05-30 ENCOUNTER — Other Ambulatory Visit: Payer: Self-pay | Admitting: Cardiology

## 2016-06-24 ENCOUNTER — Telehealth: Payer: Self-pay | Admitting: Cardiology

## 2016-06-24 MED ORDER — METOPROLOL TARTRATE 25 MG PO TABS: 25.0000 mg | ORAL_TABLET | Freq: Two times a day (BID) | ORAL | 0 refills | Status: DC

## 2016-06-24 NOTE — Telephone Encounter (Signed)
New message  Pt has appt scheduled on April 18th at 9:20 - pharmacy states no refill until appt.   *STAT* If patient is at the pharmacy, call can be transferred to refill team.   1. Which medications need to be refilled? (please list name of each medication and dose if known) metoprolol tartrate (LOPRESSOR) 25 MG tablet  2. Which pharmacy/location (including street and city if local pharmacy) is medication to be sent to? [facility] on [address]  3. Do they need a 30 day or 90 day supply? 90 day

## 2016-07-30 ENCOUNTER — Encounter: Payer: Self-pay | Admitting: Cardiology

## 2016-07-30 ENCOUNTER — Ambulatory Visit (INDEPENDENT_AMBULATORY_CARE_PROVIDER_SITE_OTHER): Payer: Medicare Other | Admitting: Cardiology

## 2016-07-30 VITALS — BP 112/66 | HR 72 | Ht 65.0 in | Wt 178.1 lb

## 2016-07-30 DIAGNOSIS — I9789 Other postprocedural complications and disorders of the circulatory system, not elsewhere classified: Secondary | ICD-10-CM | POA: Diagnosis not present

## 2016-07-30 DIAGNOSIS — I2510 Atherosclerotic heart disease of native coronary artery without angina pectoris: Secondary | ICD-10-CM

## 2016-07-30 DIAGNOSIS — I313 Pericardial effusion (noninflammatory): Secondary | ICD-10-CM

## 2016-07-30 DIAGNOSIS — I472 Ventricular tachycardia, unspecified: Secondary | ICD-10-CM

## 2016-07-30 DIAGNOSIS — E7800 Pure hypercholesterolemia, unspecified: Secondary | ICD-10-CM

## 2016-07-30 DIAGNOSIS — I3139 Other pericardial effusion (noninflammatory): Secondary | ICD-10-CM

## 2016-07-30 DIAGNOSIS — I4891 Unspecified atrial fibrillation: Secondary | ICD-10-CM

## 2016-07-30 MED ORDER — METOPROLOL TARTRATE 25 MG PO TABS: 25.0000 mg | ORAL_TABLET | Freq: Two times a day (BID) | ORAL | 11 refills | Status: DC

## 2016-07-30 NOTE — Patient Instructions (Signed)
Medication Instructions:  Your physician recommends that you continue on your current medications as directed. Please refer to the Current Medication list given to you today.   Labwork: None  Testing/Procedures: Your physician has requested that you have an echocardiogram. Echocardiography is a painless test that uses sound waves to create images of your heart. It provides your doctor with information about the size and shape of your heart and how well your heart's chambers and valves are working. This procedure takes approximately one hour. There are no restrictions for this procedure.  Follow-Up: Your physician wants you to follow-up in: 6 months with Dr. Theodosia Blender assistant. You will receive a reminder letter in the mail two months in advance. If you don't receive a letter, please call our office to schedule the follow-up appointment.   Your physician wants you to follow-up in: 1 year with Dr. Radford Pax. You will receive a reminder letter in the mail two months in advance. If you don't receive a letter, please call our office to schedule the follow-up appointment.   Any Other Special Instructions Will Be Listed Below (If Applicable).     If you need a refill on your cardiac medications before your next appointment, please call your pharmacy.

## 2016-07-30 NOTE — Progress Notes (Signed)
Cardiology Office Note    Date:  07/30/2016   ID:  Mercedes Thornton, DOB 18-Jul-1945, MRN [id number]  PCP:  Antonietta Jewel, MD  Cardiologist:  Fransico Him, MD   Chief Complaint  Patient presents with  . Coronary Artery Disease  . Hyperlipidemia  . Atrial Fibrillation    History of Present Illness:  Mercedes Thornton is a 71 y.o. 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  She has a history of post op afib, acute diastolic CHF and NSVT after appendectomy.  She is now here for cardiac followup.  She is doing well.  She denies any chest pain or pressure, SOB, DOE, PND, orthopnea, LE edema, dizziness, palpitations or syncope.      Past Medical History:  Diagnosis Date  . Allergic rhinitis   . Bipolar affective disorder ([place])   . COPD (chronic obstructive pulmonary disease) ([place])   . Coronary artery disease 02/2015   50% RCA otherwise normal  . Cough 10/30/2011  . DM (diabetes mellitus) ([place])   . Hyperlipidemia   . Leukocytosis   . Mediastinal adenopathy 11/26/2011  . Pericardial effusion   . Postoperative atrial fibrillation ([place])   . Smoking   . SOB (shortness of breath) 10/30/2011    Past Surgical History:  Procedure Laterality Date  . CARDIAC CATHETERIZATION N/A 02/15/2015   Procedure: Left Heart Cath and Coronary Angiography;  Surgeon: Leonie Man, MD;  Location: [facility];  Service: Cardiovascular;  Laterality: N/A;  . CATARACT EXTRACTION    . CESAREAN SECTION    . COLONOSCOPY  2008   negative per pt's report, with [facility]   . LAPAROSCOPIC APPENDECTOMY N/A 01/31/2015   Procedure: LYSIS OF ADHESIONS [place] [facility] LAPAROSCOPIC ;  Surgeon: Alphonsa Overall, MD;  Location: [facility];  Service: General;  Laterality: N/A;    Current Medications: Current Meds  Medication Sig  . albuterol (PROVENTIL HFA;VENTOLIN HFA) 108 (90 BASE) MCG/ACT inhaler Inhale 2  puffs into the lungs every 6 (six) hours as needed for wheezing.  Marland Kitchen aspirin 81 MG tablet Take 81 mg by mouth daily.  . cetirizine (ZYRTEC) 10 MG tablet Take 10 mg by mouth daily.  . Cholecalciferol (VITAMIN D3) 3000 UNITS TABS Take 1 tablet by mouth daily.   . Cinnamon 500 MG TABS Take 1 tablet by mouth daily.   . clonazePAM (KLONOPIN) 1 MG tablet Take 1 tablet by mouth daily.  . fluticasone (FLONASE) 50 MCG/ACT nasal spray Place 1 spray into both nostrils daily as needed for allergies.   Marland Kitchen glimepiride (AMARYL) 4 MG tablet Take 1 tablet by mouth daily.  Marland Kitchen lisinopril (PRINIVIL,ZESTRIL) 5 MG tablet Take 0.5 tablets (2.5 mg total) by mouth daily.  . metFORMIN (GLUCOPHAGE-XR) 500 MG 24 hr tablet Take 500 mg by mouth 2 (two) times daily.  . metoprolol tartrate (LOPRESSOR) 25 MG tablet Take 1 tablet (25 mg total) by mouth 2 (two) times daily.  . Multiple Vitamins-Minerals (WOMENS MULTIVITAMIN PLUS PO) Take 1 tablet by mouth daily.   . TURMERIC PO Take 1 tablet by mouth daily.  . vitamin C (ASCORBIC ACID) 500 MG tablet Take 500 mg by mouth daily.    Allergies:   Penicillins   Social History   Social History  . Marital status: Divorced    Spouse name: N/A  . Number of children: 2  . Years of education: N/A   Occupational History  .  retired grade school/middle school teacher   Social History Main Topics  . Smoking status: Current Every Day Smoker    Packs/day: 1.00    Years: 51.00    Types: Cigarettes    Last attempt to quit: 01/31/2015  . Smokeless tobacco: Never Used     Comment: pt back to smoking 2 cigarettes daily 07/31/15.Will go to [employer]  . Alcohol use No  . Drug use: No  . Sexual activity: Not Asked   Other Topics Concern  . None   Social History Narrative  . None     Family History:  The patient's family history includes Cancer in her father; Stroke in her mother.   ROS:   Please see the history of present illness.    ROS All other systems  reviewed and are negative.  No flowsheet data found.     PHYSICAL EXAM:   VS:  BP 112/66   Pulse 72   Ht 5\' 5"  (1.651 m)   Wt 178 lb 1.9 oz (80.8 kg)   BMI 29.64 kg/m    GEN: Well nourished, well developed, in no acute distress  HEENT: normal  Neck: no JVD, carotid bruits, or masses Cardiac: RRR; no murmurs, rubs, or gallops,no edema.  Intact distal pulses bilaterally.  Respiratory:  clear to auscultation bilaterally, normal work of breathing GI: soft, nontender, nondistended, + BS MS: no deformity or atrophy  Skin: warm and dry, no rash Neuro:  Alert and Oriented x 3, Strength and sensation are intact Psych: euthymic mood, full affect  Wt Readings from Last 3 Encounters:  07/30/16 178 lb 1.9 oz (80.8 kg)  08/24/15 181 lb 14.4 oz (82.5 kg)  07/31/15 179 lb (81.2 kg)      Studies/Labs Reviewed:   EKG:  EKG is ordered today.  The ekg ordered today demonstrates NSR at 72bpm with no ST changes  Recent Labs: 08/23/2015: ALT 14; BUN 9.8; Creatinine 0.8; HGB 13.8; Platelets 262; Potassium 3.7; Sodium 143   Lipid Panel    Component Value Date/Time   CHOL 112 (L) 06/13/2015 1056   TRIG 88 06/13/2015 1056   HDL 55 06/13/2015 1056   CHOLHDL 2.0 06/13/2015 1056   VLDL 18 06/13/2015 1056   LDLCALC 39 06/13/2015 1056    Additional studies/ records that were reviewed today include:  none    ASSESSMENT:    1. Coronary artery disease involving native coronary artery of native heart without angina pectoris   2. Postoperative atrial fibrillation ([facility])   3. Ventricular tachyarrhythmia ([place])   4. Pericardial effusion   5. Pure hypercholesterolemia      PLAN:  In order of problems listed above:  1. ASCAD - nonobstructive with 50% RCA - she has no anginal symptoms.  She will continue on ASA, statin and BB. 2. Postoperative atrial fibrillation after appendectomy with no reoccurence. She is not anticoagulated as this occurred in the setting of an acute illness and there has  been no reoccurence.  3. NSVT in setting of post op appendectomy and CHF with echo showing normal LVF and no obstructive CAD by cath.  Continue BB.  4. Pericardial effusion - moderate by echo 2016.  I will repeat echo to assess if this has resolved.  5.   Hyperlipidemia with LDL goal < 70.  She is now on lovastatin.  I will get a copy of her FLP and ALT from her PCP.  Medication Adjustments/Labs and Tests Ordered: Current medicines are reviewed at length with the  patient today.  Concerns regarding medicines are outlined above.  Medication changes, Labs and Tests ordered today are listed in the Patient Instructions below.  There are no Patient Instructions on file for this visit.   Signed, Fransico Him, MD  07/30/2016 9:47 AM    [address], [place], [place]  [address] Phone: [phone]; Fax: [phone]

## 2016-08-19 ENCOUNTER — Encounter: Payer: Self-pay | Admitting: Pulmonary Disease

## 2016-08-19 ENCOUNTER — Ambulatory Visit (INDEPENDENT_AMBULATORY_CARE_PROVIDER_SITE_OTHER): Payer: Medicare Other | Admitting: Pulmonary Disease

## 2016-08-19 ENCOUNTER — Telehealth: Payer: Self-pay | Admitting: Pulmonary Disease

## 2016-08-19 DIAGNOSIS — F17200 Nicotine dependence, unspecified, uncomplicated: Secondary | ICD-10-CM | POA: Diagnosis not present

## 2016-08-19 DIAGNOSIS — J432 Centrilobular emphysema: Secondary | ICD-10-CM

## 2016-08-19 DIAGNOSIS — IMO0001 Reserved for inherently not codable concepts without codable children: Secondary | ICD-10-CM

## 2016-08-19 DIAGNOSIS — J9611 Chronic respiratory failure with hypoxia: Secondary | ICD-10-CM | POA: Diagnosis not present

## 2016-08-19 NOTE — Progress Notes (Signed)
Subjective:    Patient ID: Mercedes Thornton, female    DOB: 08/28/1945, 71 y.o.   MRN: [id number]  Synopsis: As of 08/19/2016 active smoker (has smoked 1 ppd since 19 years).  Unable to complete PFTs in January 2017 but   HPI Chief Complaint  Patient presents with  . Follow-up    pt c/o allergy symptoms- runny nose, watery eyes, pnd, cough.    Mercedes Thornton is having a lot of sinus problems lately.  She is taking zyrtec as she feels that it is due to allergic rhinitis. She has been using her albuterol more. She has itching eyes. She has not used flonase because she likes to limit her exposure to medicines.  Her breathing has not worsened in the last year. She has not had bronchitis. She continues to use an benefit from her oxygen at night and benefits from it. She has had some dyspnea when she climbs stairs.  Her children are worried about her breahting worsening.  She doesn't wheeze or have chest congestion.   Past Medical History:  Diagnosis Date  . Allergic rhinitis   . Bipolar affective disorder ([place])   . COPD (chronic obstructive pulmonary disease) ([place])   . Coronary artery disease 02/2015   50% RCA otherwise normal  . Cough 10/30/2011  . DM (diabetes mellitus) ([place])   . Hyperlipidemia   . Leukocytosis   . Mediastinal adenopathy 11/26/2011  . Pericardial effusion   . Postoperative atrial fibrillation ([place])   . Smoking   . SOB (shortness of breath) 10/30/2011      Review of Systems  Constitutional: Negative for chills, fatigue and fever.  HENT: Negative for mouth sores, nosebleeds and postnasal drip.   Respiratory: Positive for shortness of breath. Negative for cough and wheezing.   Cardiovascular: Negative for chest pain, palpitations and leg swelling.       Objective:   Physical Exam  Vitals:   08/19/16 1504  BP: 116/74  Pulse: 79  SpO2: 93%  Weight: 177 lb (80.3 kg)  Height: 5\' 5"  (1.651 m)  RA  Gen: well appearing HENT: OP clear, TM's clear, neck  supple PULM: CTA B, normal percussion CV: RRR, no mgr, trace edema GI: BS+, soft, nontender Derm: no cyanosis or rash Psyche: normal mood and affect   CT images personally reviewed from 2016 > centrilobular emphysema  CBC    Component Value Date/Time   WBC 14.5 (H) 08/23/2015 1313   WBC 13.4 (H) 06/13/2015 1102   RBC 4.76 08/23/2015 1313   RBC 4.80 06/13/2015 1102   HGB 13.8 08/23/2015 1313   HCT 42.0 08/23/2015 1313   PLT 262 08/23/2015 1313   MCV 88.2 08/23/2015 1313   MCH 29.0 08/23/2015 1313   MCH 28.5 06/13/2015 1102   MCHC 32.9 08/23/2015 1313   MCHC 33.8 06/13/2015 1102   RDW 15.0 (H) 08/23/2015 1313   LYMPHSABS 3.1 08/23/2015 1313   MONOABS 1.0 (H) 08/23/2015 1313   EOSABS 0.2 08/23/2015 1313   BASOSABS 0.1 08/23/2015 1313         Assessment & Plan:  Smoking Counseled to quit smoking today.  She has stopped and started in the last year, but she continues to smoke.  She would like to quit.  Provided with information from community resources to help with smoking. Her barrier to smoking is anxiety.    Chantix helped her quit in the past but she had a lot of anxiety with it the second time she took it.  Chronic respiratory failure (HCC) Continue nocturnal oxygen.  Centrilobular emphysema ([place]) She notes worsening dyspnea in the last year, to a very mild degree.  She only needs albuterol 1-2 times per month and she is not limited by dyspnea so at this point I don't feel that long acting medicines are needed (LABA/LAMA etc).    Plan: Continue albuterol prn Counseled at length to quit  > 50% of this 27 minute visit spent face to face    Current Outpatient Prescriptions:  .  albuterol (PROVENTIL HFA;VENTOLIN HFA) 108 (90 BASE) MCG/ACT inhaler, Inhale 2 puffs into the lungs every 6 (six) hours as needed for wheezing., Disp: , Rfl:  .  aspirin 81 MG tablet, Take 81 mg by mouth daily., Disp: , Rfl:  .  cetirizine (ZYRTEC) 10 MG tablet, Take 10 mg by mouth  daily., Disp: , Rfl:  .  Cholecalciferol (VITAMIN D3) 3000 UNITS TABS, Take 1 tablet by mouth daily. , Disp: , Rfl:  .  Cinnamon 500 MG TABS, Take 1 tablet by mouth daily. , Disp: , Rfl:  .  clonazePAM (KLONOPIN) 1 MG tablet, Take 1 tablet by mouth daily., Disp: , Rfl:  .  fluticasone (FLONASE) 50 MCG/ACT nasal spray, Place 1 spray into both nostrils daily as needed for allergies. , Disp: , Rfl:  .  glimepiride (AMARYL) 4 MG tablet, Take 1 tablet by mouth daily., Disp: , Rfl:  .  lisinopril (PRINIVIL,ZESTRIL) 5 MG tablet, Take 0.5 tablets (2.5 mg total) by mouth daily., Disp: 30 tablet, Rfl: 0 .  metFORMIN (GLUCOPHAGE-XR) 500 MG 24 hr tablet, Take 500 mg by mouth 2 (two) times daily., Disp: , Rfl:  .  metoprolol tartrate (LOPRESSOR) 25 MG tablet, Take 1 tablet (25 mg total) by mouth 2 (two) times daily., Disp: 60 tablet, Rfl: 11 .  Multiple Vitamins-Minerals (WOMENS MULTIVITAMIN PLUS PO), Take 1 tablet by mouth daily. , Disp: , Rfl:  .  TURMERIC PO, Take 1 tablet by mouth daily., Disp: , Rfl:  .  vitamin C (ASCORBIC ACID) 500 MG tablet, Take 500 mg by mouth daily., Disp: , Rfl:

## 2016-08-19 NOTE — Telephone Encounter (Signed)
Called and spoke to pt. Pt was seen today 08/19/16 but BQ and states she is wearing nocturnal O2 and is questioning if she should continue taking this. Advised her per BQ's note that she should continue using nocturnal O2. Pt verbalized understanding and denied any further questions or concerns at this time.

## 2016-08-19 NOTE — Assessment & Plan Note (Signed)
She notes worsening dyspnea in the last year, to a very mild degree.  She only needs albuterol 1-2 times per month and she is not limited by dyspnea so at this point I don't feel that long acting medicines are needed (LABA/LAMA etc).    Plan: Continue albuterol prn Counseled at length to quit  > 50% of this 27 minute visit spent face to face

## 2016-08-19 NOTE — Assessment & Plan Note (Addendum)
Counseled to quit smoking today.  She has stopped and started in the last year, but she continues to smoke.  She would like to quit.  Provided with information from community resources to help with smoking. Her barrier to smoking is anxiety.    Chantix helped her quit in the past but she had a lot of anxiety with it the second time she took it.

## 2016-08-19 NOTE — Assessment & Plan Note (Signed)
Continue nocturnal oxygen 

## 2016-08-19 NOTE — Patient Instructions (Signed)
Stop smoking Keep your appointment for the CT scan We will see you back in 6 months or sooner if needed

## 2016-08-28 ENCOUNTER — Other Ambulatory Visit: Payer: Self-pay

## 2016-08-28 ENCOUNTER — Ambulatory Visit (HOSPITAL_COMMUNITY): Payer: Medicare Other | Attending: Cardiology

## 2016-08-28 ENCOUNTER — Ambulatory Visit (INDEPENDENT_AMBULATORY_CARE_PROVIDER_SITE_OTHER)
Admission: RE | Admit: 2016-08-28 | Discharge: 2016-08-28 | Disposition: A | Discharge status: Home / Self Care | Payer: Medicare Other | Source: Ambulatory Visit | Attending: Acute Care | Admitting: Acute Care

## 2016-08-28 DIAGNOSIS — I3139 Other pericardial effusion (noninflammatory): Secondary | ICD-10-CM

## 2016-08-28 DIAGNOSIS — I358 Other nonrheumatic aortic valve disorders: Secondary | ICD-10-CM | POA: Insufficient documentation

## 2016-08-28 DIAGNOSIS — I340 Nonrheumatic mitral (valve) insufficiency: Secondary | ICD-10-CM | POA: Diagnosis not present

## 2016-08-28 DIAGNOSIS — I313 Pericardial effusion (noninflammatory): Secondary | ICD-10-CM | POA: Insufficient documentation

## 2016-08-28 DIAGNOSIS — F17210 Nicotine dependence, cigarettes, uncomplicated: Secondary | ICD-10-CM

## 2016-08-28 DIAGNOSIS — Z87891 Personal history of nicotine dependence: Secondary | ICD-10-CM

## 2016-08-28 DIAGNOSIS — I361 Nonrheumatic tricuspid (valve) insufficiency: Secondary | ICD-10-CM | POA: Insufficient documentation

## 2016-08-28 DIAGNOSIS — I371 Nonrheumatic pulmonary valve insufficiency: Secondary | ICD-10-CM | POA: Insufficient documentation

## 2016-09-01 ENCOUNTER — Other Ambulatory Visit: Payer: Self-pay | Admitting: Acute Care

## 2016-09-01 DIAGNOSIS — F17210 Nicotine dependence, cigarettes, uncomplicated: Secondary | ICD-10-CM

## 2017-02-09 ENCOUNTER — Other Ambulatory Visit: Payer: Self-pay | Admitting: Internal Medicine

## 2017-02-09 DIAGNOSIS — N6320 Unspecified lump in the left breast, unspecified quadrant: Secondary | ICD-10-CM

## 2017-02-25 ENCOUNTER — Encounter: Payer: Self-pay | Admitting: Pulmonary Disease

## 2017-02-25 ENCOUNTER — Ambulatory Visit: Payer: Medicare Other | Admitting: Pulmonary Disease

## 2017-02-25 VITALS — BP 128/64 | HR 78 | Ht 65.0 in | Wt 177.0 lb

## 2017-02-25 DIAGNOSIS — J432 Centrilobular emphysema: Secondary | ICD-10-CM | POA: Diagnosis not present

## 2017-02-25 DIAGNOSIS — F17210 Nicotine dependence, cigarettes, uncomplicated: Secondary | ICD-10-CM

## 2017-02-25 DIAGNOSIS — F17200 Nicotine dependence, unspecified, uncomplicated: Secondary | ICD-10-CM

## 2017-02-25 MED ORDER — NICOTINE 10 MG IN INHA: 1.0000 | RESPIRATORY_TRACT | 3 refills | Status: DC | PRN

## 2017-02-25 NOTE — Patient Instructions (Signed)
Cigarette smoking: Quit smoking Use the Nicotrol inhaler to help quit smoking Keep your follow-up visits with the lung cancer screening program, the next CT will be in May 2019 Follow-up with me in 1 year or sooner if needed

## 2017-02-25 NOTE — Progress Notes (Signed)
Subjective:    Patient ID: Mercedes Thornton, female    DOB: 1945/06/23, 71 y.o.   MRN: [id number]  Synopsis: As of 02/25/2017 active smoker (has smoked 1 ppd since 18 years).  Unable to complete PFTs in January 2017   HPI Chief Complaint  Patient presents with  . Follow-up    pt states she is doing well, notes occasional prod cough with clear mucus worse qhs.    Mercedes Thornton says she is OK.  She seldom needs the proAir, only if she is quite winded.  She uses it less than 2 times per month. No bronchitis or pneumonia.  She has minimal dyspnea. She is trying to quit smoking right now.  Past Medical History:  Diagnosis Date  . Allergic rhinitis   . Bipolar affective disorder ([place])   . COPD (chronic obstructive pulmonary disease) ([place])   . Coronary artery disease 02/2015   50% RCA otherwise normal  . Cough 10/30/2011  . DM (diabetes mellitus) ([place])   . Hyperlipidemia   . Leukocytosis   . Mediastinal adenopathy 11/26/2011  . Pericardial effusion   . Postoperative atrial fibrillation ([place])   . Smoking   . SOB (shortness of breath) 10/30/2011      Review of Systems  Constitutional: Negative for chills, fatigue and fever.  HENT: Negative for mouth sores, nosebleeds and postnasal drip.   Respiratory: Positive for shortness of breath. Negative for cough and wheezing.   Cardiovascular: Negative for chest pain, palpitations and leg swelling.       Objective:   Physical Exam  Vitals:   02/25/17 1331  BP: 128/64  Pulse: 78  SpO2: 97%  Weight: 177 lb (80.3 kg)  Height: 5\' 5"  (1.651 m)  RA  Gen: well appearing HENT: OP clear, TM's clear, neck supple PULM: CTA B, normal percussion CV: RRR, no mgr, trace edema GI: BS+, soft, nontender Derm: no cyanosis or rash Psyche: normal mood and affect   CT images personally reviewed from 2016 > centrilobular emphysema 08/2016 CT chest (screening)> RADS 2  CBC    Component Value Date/Time   WBC 14.5 (H) 08/23/2015 1313   WBC 13.4 (H)  06/13/2015 1102   RBC 4.76 08/23/2015 1313   RBC 4.80 06/13/2015 1102   HGB 13.8 08/23/2015 1313   HCT 42.0 08/23/2015 1313   PLT 262 08/23/2015 1313   MCV 88.2 08/23/2015 1313   MCH 29.0 08/23/2015 1313   MCH 28.5 06/13/2015 1102   MCHC 32.9 08/23/2015 1313   MCHC 33.8 06/13/2015 1102   RDW 15.0 (H) 08/23/2015 1313   LYMPHSABS 3.1 08/23/2015 1313   MONOABS 1.0 (H) 08/23/2015 1313   EOSABS 0.2 08/23/2015 1313   BASOSABS 0.1 08/23/2015 1313         Assessment & Plan:   Smoking  Centrilobular emphysema ([place])  Discussion: This has been a stable interval for Mercedes Thornton.  Unfortunately she continues to smoke cigarettes.  I counseled her today on the importance of quitting smoking.  We talked about various options and she is interested in trying the Nicotrol inhaler.  We discussed its use today. She needs to keep her visits with lung cancer screening.  Even though she has centrilobular emphysema she has minimal symptoms so there is no role for an inhaled medicine other than as needed albuterol.  Plan: Cigarette smoking: Quit smoking Use the Nicotrol inhaler to help quit smoking Keep your follow-up visits with the lung cancer screening program, the next CT will be in May 2019 Follow-up  with me in 1 year or sooner if needed    Current Outpatient Medications:  .  albuterol (PROVENTIL HFA;VENTOLIN HFA) 108 (90 BASE) MCG/ACT inhaler, Inhale 2 puffs into the lungs every 6 (six) hours as needed for wheezing., Disp: , Rfl:  .  aspirin 81 MG tablet, Take 81 mg by mouth daily., Disp: , Rfl:  .  cetirizine (ZYRTEC) 10 MG tablet, Take 10 mg by mouth daily., Disp: , Rfl:  .  Cholecalciferol (VITAMIN D3) 3000 UNITS TABS, Take 1 tablet by mouth daily. , Disp: , Rfl:  .  Cinnamon 500 MG TABS, Take 1 tablet by mouth daily. , Disp: , Rfl:  .  clonazePAM (KLONOPIN) 1 MG tablet, Take 1 tablet by mouth daily., Disp: , Rfl:  .  fluticasone (FLONASE) 50 MCG/ACT nasal spray, Place 1 spray into both  nostrils daily as needed for allergies. , Disp: , Rfl:  .  glimepiride (AMARYL) 4 MG tablet, Take 1 tablet by mouth daily., Disp: , Rfl:  .  lisinopril (PRINIVIL,ZESTRIL) 5 MG tablet, Take 0.5 tablets (2.5 mg total) by mouth daily., Disp: 30 tablet, Rfl: 0 .  lovastatin (MEVACOR) 10 MG tablet, Take 10 mg at bedtime by mouth., Disp: , Rfl:  .  metFORMIN (GLUCOPHAGE-XR) 500 MG 24 hr tablet, Take 500 mg by mouth 2 (two) times daily., Disp: , Rfl:  .  metoprolol tartrate (LOPRESSOR) 25 MG tablet, Take 1 tablet (25 mg total) by mouth 2 (two) times daily., Disp: 60 tablet, Rfl: 11 .  Multiple Vitamins-Minerals (WOMENS MULTIVITAMIN PLUS PO), Take 1 tablet by mouth daily. , Disp: , Rfl:  .  TURMERIC PO, Take 1 tablet by mouth daily., Disp: , Rfl:  .  vitamin C (ASCORBIC ACID) 500 MG tablet, Take 500 mg by mouth daily., Disp: , Rfl:

## 2017-03-02 ENCOUNTER — Other Ambulatory Visit: Payer: Self-pay | Admitting: Family Medicine

## 2017-03-17 ENCOUNTER — Other Ambulatory Visit: Payer: Self-pay | Admitting: Internal Medicine

## 2017-03-17 ENCOUNTER — Ambulatory Visit
Admission: RE | Admit: 2017-03-17 | Discharge: 2017-03-17 | Disposition: A | Discharge status: Home / Self Care | Payer: Medicare Other | Source: Ambulatory Visit | Attending: Internal Medicine | Admitting: Internal Medicine

## 2017-03-17 DIAGNOSIS — N6320 Unspecified lump in the left breast, unspecified quadrant: Secondary | ICD-10-CM

## 2017-04-19 IMAGING — CT CT CHEST W/O CM
2 of 5 series · 15 of 36 positions shown, 18 images · non-contrast
Comparison: 08/19/2013, 06/02/2012 and back to 11/11/2011.

CLINICAL DATA: Followup of mediastinal adenopathy. Shortness of
breath. Cough and congestion. Occasional smoker. COPD.
Hyperlipidemia.

EXAM:
CT CHEST WITHOUT CONTRAST
TECHNIQUE: Multidetector CT imaging of the chest was performed following the
standard protocol without IV contrast.

[Series 2: chest w/o st · axial · non-contrast · 0.83mm/px · z∈[-286,-6]mm · 12 of 68 slices shown, 15 images]
[im 6/68  mediastinal]
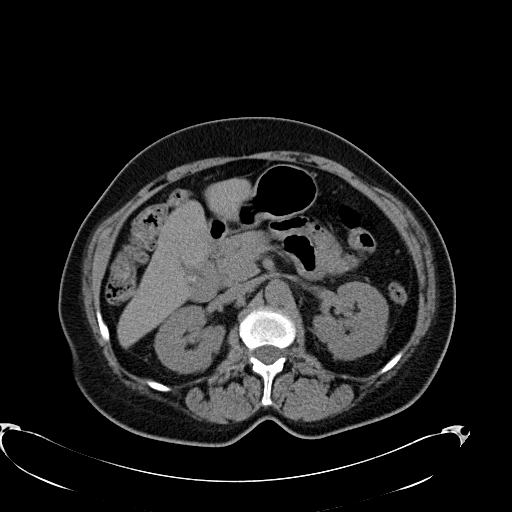
[im 6/68  lung]
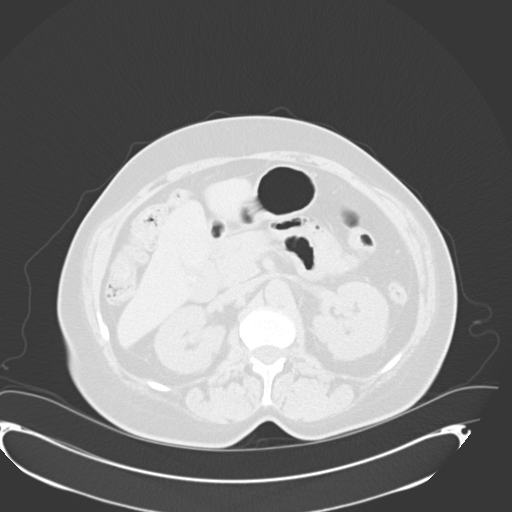
[im 11/68  lung]
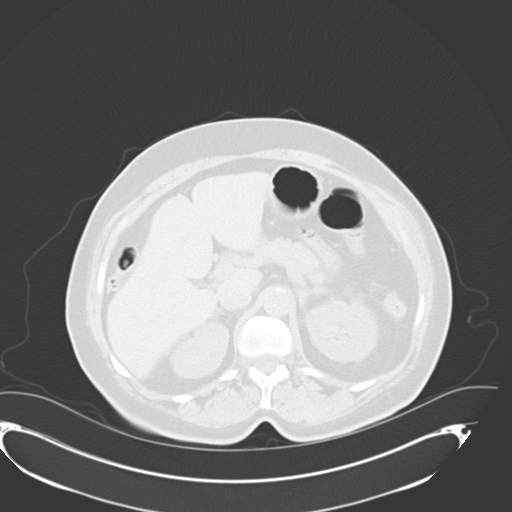
[im 16/68  lung]
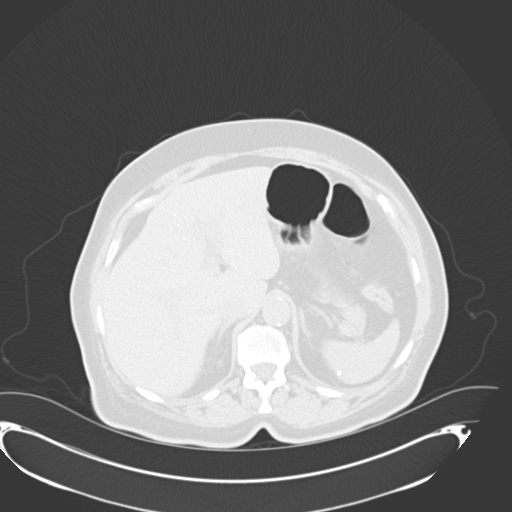
[im 21/68  lung]
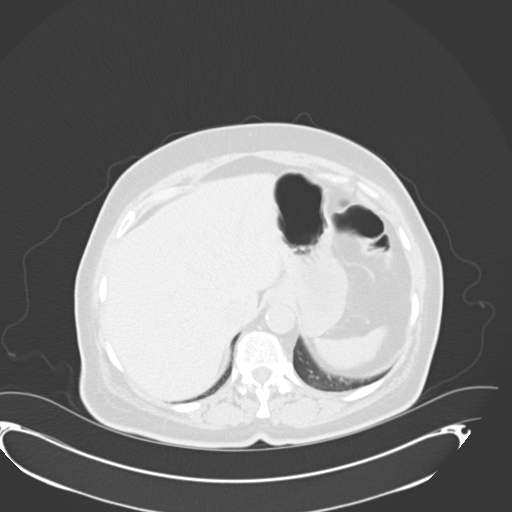
[im 26/68  mediastinal]
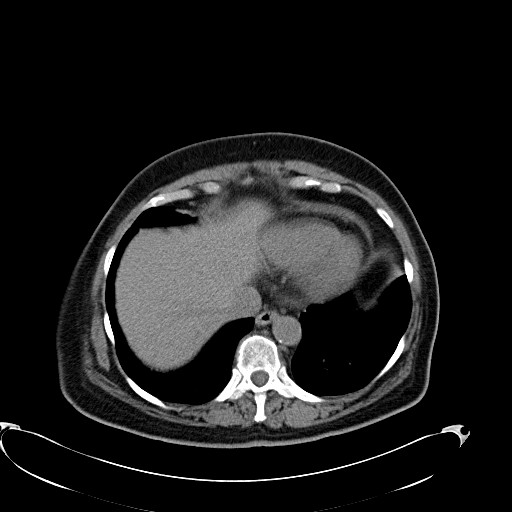
[im 26/68  lung]
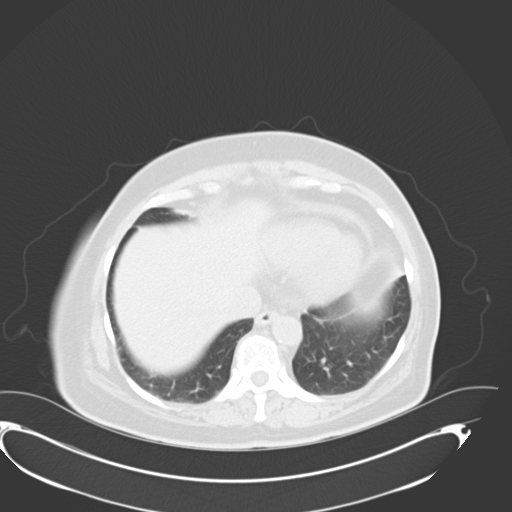
[im 31/68  lung]
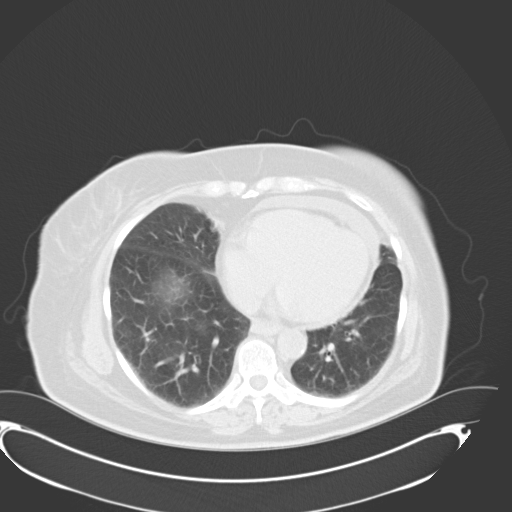
[im 37/68  lung]
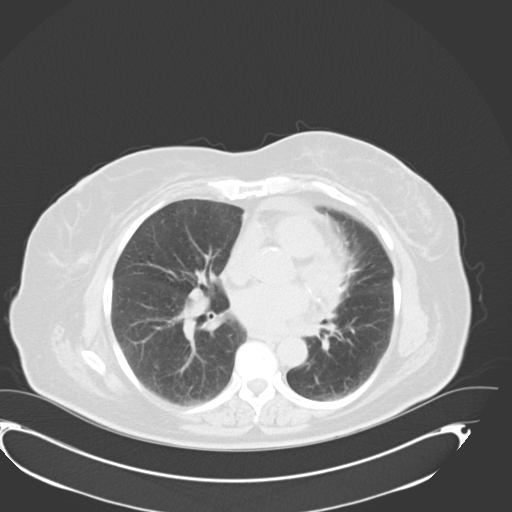
[im 42/68  lung]
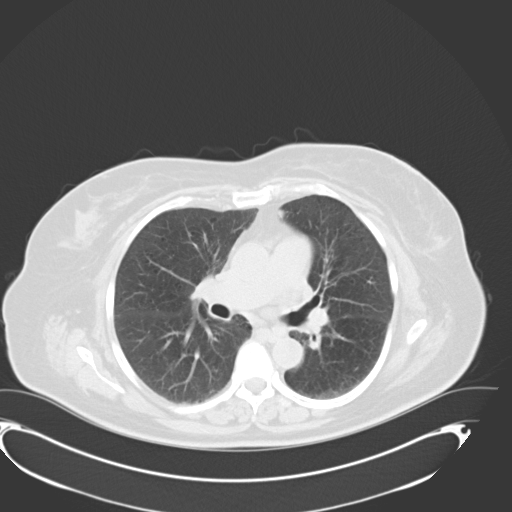
[im 47/68  mediastinal]
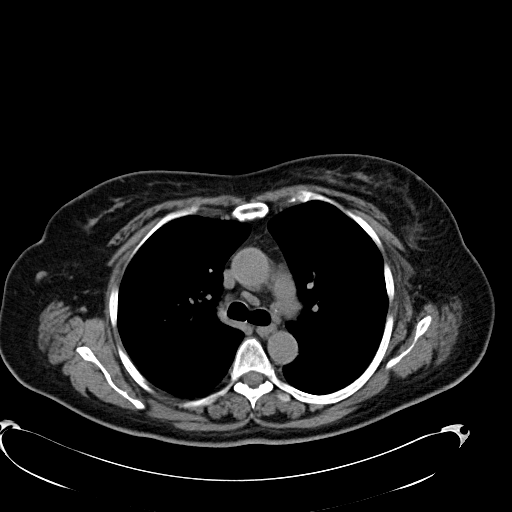
[im 47/68  lung]
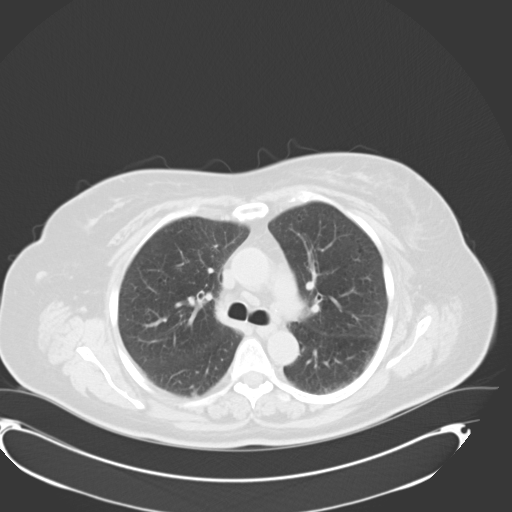
[im 52/68  lung]
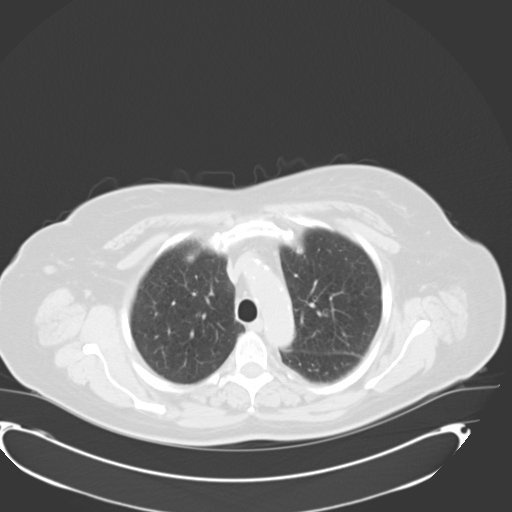
[im 57/68  lung]
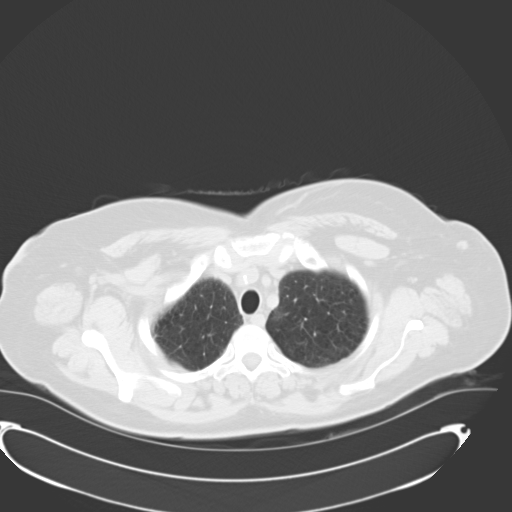
[im 62/68  lung]
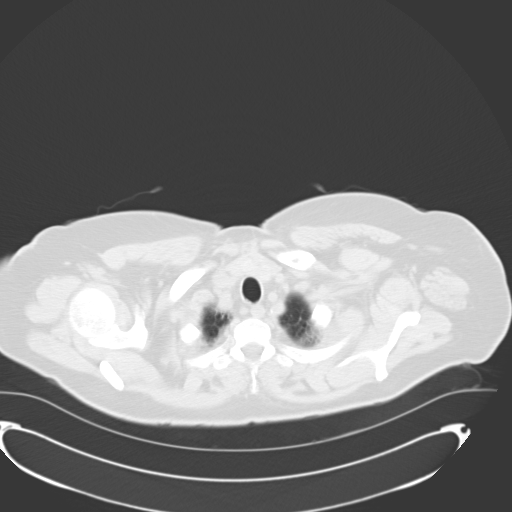

[Series 602: <mpr thick range> · coronal · 0.83mm/px · 3 of 93 slices shown]
[im 19/93  lung]
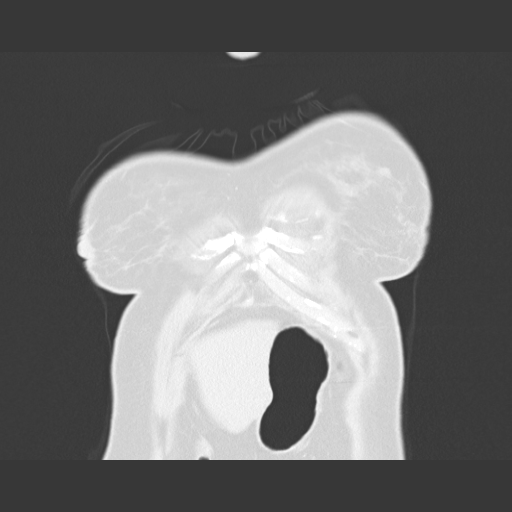
[im 37/93  lung]
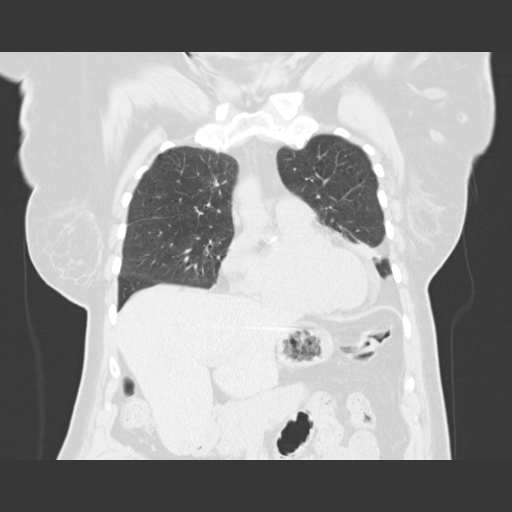
[im 56/93  lung]
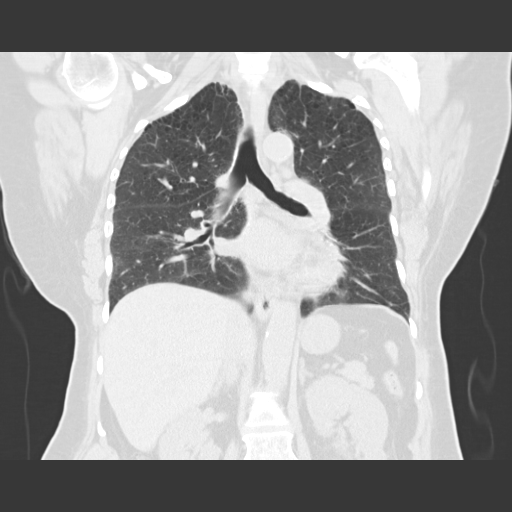

[15 of 36 positions shown; findings below may reference images not displayed]

FINDINGS: Mediastinum/Nodes: No supraclavicular adenopathy. No axillary
adenopathy. Aortic and branch vessel atherosclerosis. Mild
cardiomegaly with similar small pericardial effusion. Multivessel
coronary artery atherosclerosis.

AP window node measures 1.4 cm today versus 1.3 cm on the prior exam
and 1.3 cm back to 11/11/2011.

Subcarinal node measures 11 mm on image 25 and is unchanged. Hilar
regions poorly evaluated without intravenous contrast.

Prevascular node measures 8 mm on image 21 and is unchanged.

Lungs/Pleura: No pleural fluid. Moderate centrilobular emphysema.
Secretions in the dependent trachea.

3 mm left upper lobe pulmonary nodule on image 29 is unchanged.

2 mm right upper lobe pulmonary nodule on image 24 is similar.

No new or enlarging nodules identified.

Upper abdomen: Normal imaged portions of the liver, stomach,
pancreas, adrenal glands, kidneys. Abdominal aortic and branch
vessel atherosclerosis. Old granulomatous disease in the spleen. No
abdominal adenopathy.

Musculoskeletal: No acute osseous abnormality.
IMPRESSION: 1. Similar mild thoracic adenopathy. Ongoing stability favors a
benign/reactive etiology.
2. Similar tiny pulmonary nodules, benign.
3. Similar small pericardial effusion.
4. Centrilobular emphysema.

## 2017-08-04 ENCOUNTER — Other Ambulatory Visit: Payer: Self-pay | Admitting: Cardiology

## 2017-08-10 ENCOUNTER — Other Ambulatory Visit: Payer: Self-pay | Admitting: Internal Medicine

## 2017-08-10 DIAGNOSIS — N6320 Unspecified lump in the left breast, unspecified quadrant: Secondary | ICD-10-CM

## 2017-08-14 ENCOUNTER — Ambulatory Visit
Admission: RE | Admit: 2017-08-14 | Discharge: 2017-08-14 | Disposition: A | Discharge status: Home / Self Care | Payer: Medicare Other | Source: Ambulatory Visit | Attending: Internal Medicine | Admitting: Internal Medicine

## 2017-08-14 ENCOUNTER — Encounter: Payer: Self-pay | Admitting: Cardiology

## 2017-08-14 ENCOUNTER — Ambulatory Visit: Payer: Medicare Other | Admitting: Cardiology

## 2017-08-14 VITALS — BP 110/60 | HR 81 | Ht 65.0 in | Wt 178.0 lb

## 2017-08-14 DIAGNOSIS — I472 Ventricular tachycardia, unspecified: Secondary | ICD-10-CM

## 2017-08-14 DIAGNOSIS — I313 Pericardial effusion (noninflammatory): Secondary | ICD-10-CM

## 2017-08-14 DIAGNOSIS — I9789 Other postprocedural complications and disorders of the circulatory system, not elsewhere classified: Secondary | ICD-10-CM | POA: Diagnosis not present

## 2017-08-14 DIAGNOSIS — N6320 Unspecified lump in the left breast, unspecified quadrant: Secondary | ICD-10-CM

## 2017-08-14 DIAGNOSIS — E7800 Pure hypercholesterolemia, unspecified: Secondary | ICD-10-CM

## 2017-08-14 DIAGNOSIS — I2510 Atherosclerotic heart disease of native coronary artery without angina pectoris: Secondary | ICD-10-CM

## 2017-08-14 DIAGNOSIS — I4891 Unspecified atrial fibrillation: Secondary | ICD-10-CM | POA: Diagnosis not present

## 2017-08-14 DIAGNOSIS — I3139 Other pericardial effusion (noninflammatory): Secondary | ICD-10-CM

## 2017-08-14 NOTE — Progress Notes (Signed)
Cardiology Office Note:    Date:  08/14/2017   ID:  Mercedes Thornton, DOB [birth year], MRN [id number]  PCP:  Antonietta Jewel, MD  Cardiologist:  No primary care provider on file.    Referring MD: Antonietta Jewel, MD   Chief Complaint  Patient presents with  . Coronary Artery Disease  . Hyperlipidemia  . Congestive Heart Failure  . Atrial Fibrillation    History of Present Illness:    Mercedes Thornton is a 72 y.o. female with a hx of ongoing tobacco use, COPD, DM, dyslipidemia, pericardial effusion, nonobstructive ASCAD with 50% RCA by cath 02/2015 and bipolar disorder. She has a history of post op afib,  diastolic CHF and NSVT after appendectomy.  She is here today for followup and is doing well.  She denies any chest pain or pressure, SOB, DOE, PND, orthopnea, LE edema, dizziness, palpitations or syncope. She is compliant with her meds and is tolerating meds with no SE.    Past Medical History:  Diagnosis Date  . Allergic rhinitis   . Bipolar affective disorder ([place])   . COPD (chronic obstructive pulmonary disease) ([place])   . Coronary artery disease 02/2015   50% RCA otherwise normal  . Cough 10/30/2011  . DM (diabetes mellitus) ([place])   . Hyperlipidemia   . Leukocytosis   . Mediastinal adenopathy 11/26/2011  . Pericardial effusion   . Postoperative atrial fibrillation ([place])   . Smoking   . SOB (shortness of breath) 10/30/2011    Past Surgical History:  Procedure Laterality Date  . CARDIAC CATHETERIZATION N/A 02/15/2015   Procedure: Left Heart Cath and Coronary Angiography;  Surgeon: Leonie Man, MD;  Location: [facility];  Service: Cardiovascular;  Laterality: N/A;  . CATARACT EXTRACTION    . CESAREAN SECTION    . COLONOSCOPY  2008   negative per pt's report, with [facility]   . LAPAROSCOPIC APPENDECTOMY N/A 01/31/2015   Procedure: LYSIS OF ADHESIONS [place] LAPAROSCOPIC ;  Surgeon: Alphonsa Overall, MD;  Location: [facility];  Service: General;  Laterality: N/A;    Current  Medications: Current Meds  Medication Sig  . albuterol (PROVENTIL HFA;VENTOLIN HFA) 108 (90 BASE) MCG/ACT inhaler Inhale 2 puffs into the lungs every 6 (six) hours as needed for wheezing.  Marland Kitchen aspirin 81 MG tablet Take 81 mg by mouth daily.  . Cholecalciferol (VITAMIN D3) 3000 UNITS TABS Take 1 tablet by mouth daily.   . Cinnamon 500 MG TABS Take 1 tablet by mouth daily.   . clonazePAM (KLONOPIN) 1 MG tablet Take 1 tablet by mouth daily.  . fluticasone (FLONASE) 50 MCG/ACT nasal spray Place 1 spray into both nostrils daily as needed for allergies.   Marland Kitchen glimepiride (AMARYL) 4 MG tablet Take 1 tablet by mouth daily.  Marland Kitchen lisinopril (PRINIVIL,ZESTRIL) 5 MG tablet Take 0.5 tablets (2.5 mg total) by mouth daily.  Marland Kitchen lovastatin (MEVACOR) 10 MG tablet Take 10 mg at bedtime by mouth.  . metFORMIN (GLUCOPHAGE-XR) 500 MG 24 hr tablet Take 500 mg by mouth 2 (two) times daily.  . metoprolol tartrate (LOPRESSOR) 25 MG tablet Take 1 tablet (25 mg total) by mouth 2 (two) times daily. Patient needs to call and schedule an appointment for further refills 1st attempt  . Multiple Vitamins-Minerals (WOMENS MULTIVITAMIN PLUS PO) Take 1 tablet by mouth daily.   . nicotine (NICOTROL) 10 MG inhaler Inhale 1 Cartridge (1 continuous puffing total) as needed into the lungs for smoking cessation.  . TURMERIC PO Take 1 tablet  by mouth daily.  . vitamin C (ASCORBIC ACID) 500 MG tablet Take 500 mg by mouth daily.     Allergies:   Penicillins   Social History   Socioeconomic History  . Marital status: Divorced    Spouse name: Not on file  . Number of children: 2  . Years of education: Not on file  . Highest education level: Not on file  Occupational History    Comment: retired grade school/middle school teacher  Social Needs  . Financial resource strain: Not on file  . Food insecurity:    Worry: Not on file    Inability: Not on file  . Transportation needs:    Medical: Not on file    Non-medical: Not on file    Tobacco Use  . Smoking status: Current Every Day Smoker    Packs/day: 1.00    Years: 51.00    Pack years: 51.00    Types: Cigarettes  . Smokeless tobacco: Never Used  . Tobacco comment: pt down to 0.25ppd  Substance and Sexual Activity  . Alcohol use: No    Alcohol/week: 0.0 oz  . Drug use: No  . Sexual activity: Not on file  Lifestyle  . Physical activity:    Days per week: Not on file    Minutes per session: Not on file  . Stress: Not on file  Relationships  . Social connections:    Talks on phone: Not on file    Gets together: Not on file    Attends religious service: Not on file    Active member of club or organization: Not on file    Attends meetings of clubs or organizations: Not on file    Relationship status: Not on file  Other Topics Concern  . Not on file  Social History Narrative  . Not on file     Family History: The patient's family history includes Cancer in her father; Stroke in her mother.  ROS:   Please see the history of present illness.    ROS  All other systems reviewed and negative.   EKGs/Labs/Other Studies Reviewed:    The following studies were reviewed today: none  EKG:  EKG is  ordered today.  The ekg ordered today demonstrates sinus rhythm 81 bpm with nonspecific ST abnormality.  Recent Labs: No results found for requested labs within last 8760 hours.   Recent Lipid Panel    Component Value Date/Time   CHOL 112 (L) 06/13/2015 1056   TRIG 88 06/13/2015 1056   HDL 55 06/13/2015 1056   CHOLHDL 2.0 06/13/2015 1056   VLDL 18 06/13/2015 1056   LDLCALC 39 06/13/2015 1056    Physical Exam:    VS:  BP 110/60   Pulse 81   Ht 5\' 5"  (1.651 m)   Wt 178 lb (80.7 kg)   SpO2 92%   BMI 29.62 kg/m     Wt Readings from Last 3 Encounters:  08/14/17 178 lb (80.7 kg)  02/25/17 177 lb (80.3 kg)  08/19/16 177 lb (80.3 kg)     GEN:  Well nourished, well developed in no acute distress HEENT: Normal NECK: No JVD; No carotid  bruits LYMPHATICS: No lymphadenopathy CARDIAC: RRR, no murmurs, rubs, gallops RESPIRATORY:  Clear to auscultation without rales, wheezing or rhonchi  ABDOMEN: Soft, non-tender, non-distended1. MUSCULOSKELETAL:  No edema; No deformity  SKIN: Warm and dry NEUROLOGIC:  Alert and oriented x 3 PSYCHIATRIC:  Normal affect   ASSESSMENT:    1. Coronary artery  disease involving native coronary artery of native heart without angina pectoris   2. Ventricular tachyarrhythmia ([place])   3. Postoperative atrial fibrillation (HCC)   4. Pericardial effusion   5. Pure hypercholesterolemia    PLAN:    In order of problems listed above:  1.  ASCAD -cath 2016 showed 50% RCA otherwise normal coronary arteries.  She denies any anginal symptoms.  Will continue on aspirin 81 mg daily, Lopressor 25 mg twice daily and lovastatin.  2.  Ventricular tachycardia after appendectomy with cath showing 50% RCA stenosis normal LV function on echo..  She has not had any further arrhythmias.  She will continue on Lopressor 25 mg twice daily  3.  Postoperative atrial fibrillation -occurred after appendectomy.  She will continue on Lopressor 25 mg twice daily.  She has had no further episodes.  She has not been anticoagulated given that this was a single occurrence in the setting of severe illness.  4.  Pericardial effusion -mild posterior effusion by echo a year ago.  I will repeat echo to make sure this is resolved.  5.  Hyperlipidemia with LDL goal less than 70.  She will continue on lovastatin 10 mg daily.  I will check an FLP and ALT.   Medication Adjustments/Labs and Tests Ordered: Current medicines are reviewed at length with the patient today.  Concerns regarding medicines are outlined above.  No orders of the defined types were placed in this encounter.  No orders of the defined types were placed in this encounter.   Signed, Fransico Him, MD  08/14/2017 8:36 AM    [place]

## 2017-08-14 NOTE — Patient Instructions (Signed)
Medication Instructions:  Your physician recommends that you continue on your current medications as directed. Please refer to the Current Medication list given to you today.  If you need a refill on your cardiac medications, please contact your pharmacy first.  Labwork: Your physician recommends that you return for lab work in: 1-2 weeks for liver function and fasting lipids   Testing/Procedures: None ordered   Follow-Up: Your physician wants you to follow-up in: 1 year with Dr. Radford Pax. You will receive a reminder letter in the mail two months in advance. If you don't receive a letter, please call our office to schedule the follow-up appointment.  Any Other Special Instructions Will Be Listed Below (If Applicable).   Thank you for choosing [facility], RN  [phone]  If you need a refill on your cardiac medications before your next appointment, please call your pharmacy.

## 2017-08-18 ENCOUNTER — Other Ambulatory Visit: Payer: Medicare Other

## 2017-08-20 ENCOUNTER — Other Ambulatory Visit: Payer: Medicare Other

## 2017-08-20 DIAGNOSIS — E7800 Pure hypercholesterolemia, unspecified: Secondary | ICD-10-CM

## 2017-08-20 LAB — HEPATIC FUNCTION PANEL
ALK PHOS: 65 IU/L (ref 39–117)
ALT: 7 IU/L (ref 0–32)
AST: 13 IU/L (ref 0–40)
Albumin: 4 g/dL (ref 3.5–4.8)
BILIRUBIN TOTAL: 0.4 mg/dL (ref 0.0–1.2)
BILIRUBIN, DIRECT: 0.12 mg/dL (ref 0.00–0.40)
Total Protein: 7.5 g/dL (ref 6.0–8.5)

## 2017-08-20 LAB — LIPID PANEL
CHOLESTEROL TOTAL: 142 mg/dL (ref 100–199)
Chol/HDL Ratio: 3.1 ratio (ref 0.0–4.4)
HDL: 46 mg/dL (ref >39)
LDL Calculated: 74 mg/dL (ref 0–99)
Triglycerides: 110 mg/dL (ref 0–149)
VLDL CHOLESTEROL CAL: 22 mg/dL (ref 5–40)

## 2017-08-28 ENCOUNTER — Other Ambulatory Visit: Payer: Self-pay | Admitting: Cardiology

## 2017-10-25 IMAGING — DX DG CHEST 2V
2 series · 2 of 2 positions shown · non-contrast
Comparison: PA and lateral chest 02/14/2015 and 01/31/2015. CT
chest 08/24/2014.

CLINICAL DATA: Status post appendectomy 01/31/2015. COPD.
Subsequent encounter.

EXAM:
CHEST  2 VIEW

[chest pa]
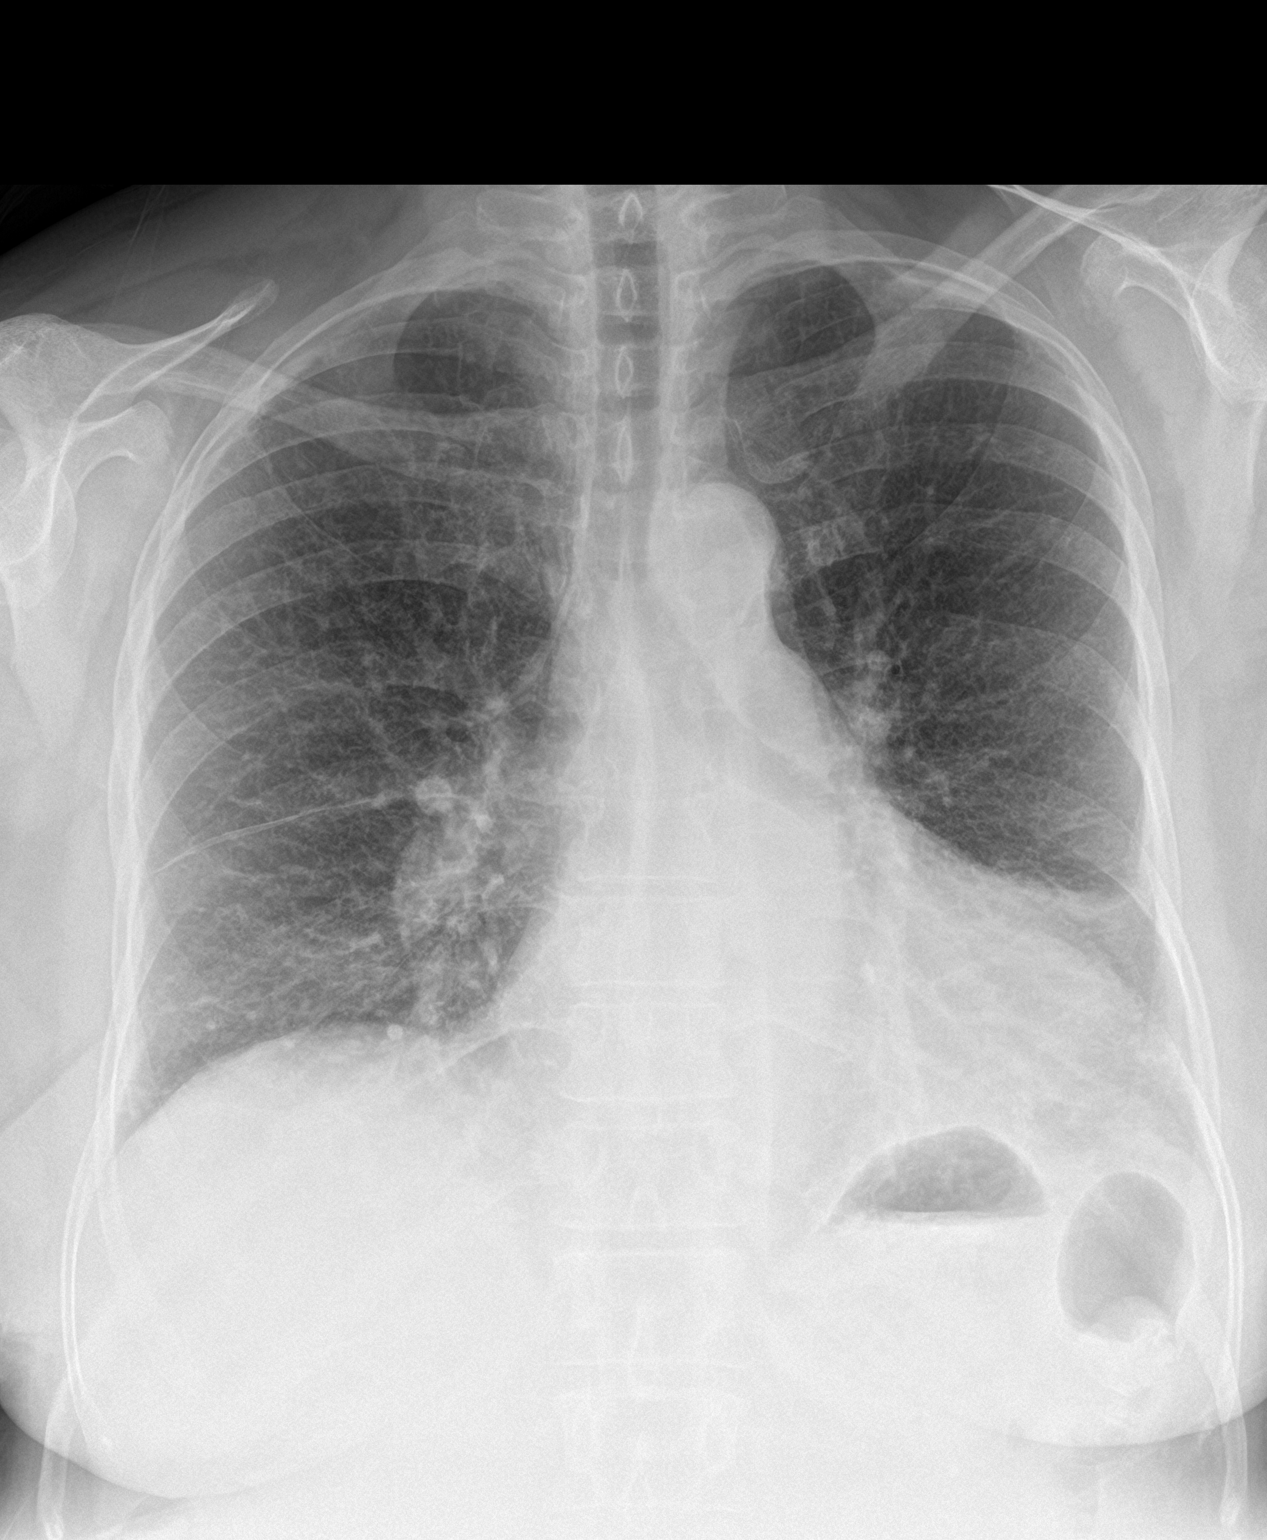

[chest lat]
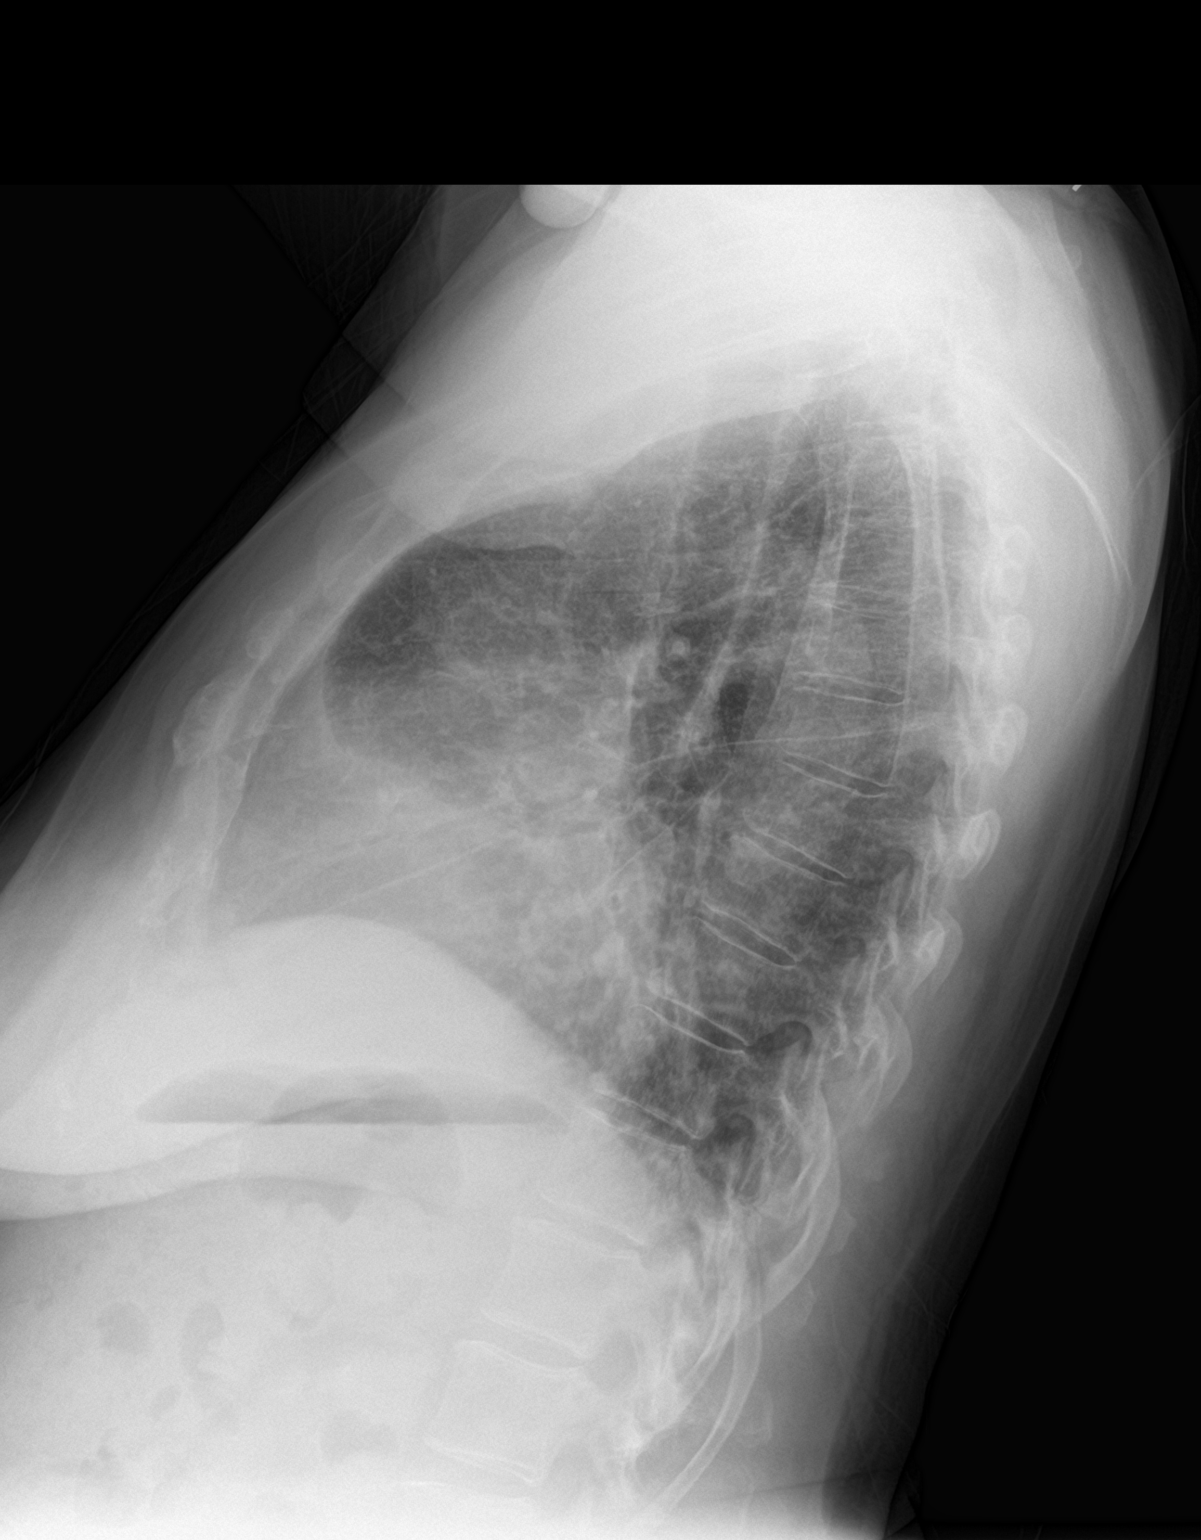

[2 of 2 positions shown; findings below may reference images not displayed]

FINDINGS: The lungs are emphysematous and there is peribronchial thickening.
Mild left basilar atelectasis is seen. Enlarged cardiopericardial
silhouette is noted. No pneumothorax or pleural effusion.
IMPRESSION: COPD without acute disease.

Enlarged cardiopericardial silhouette compatible with pericardial
effusion as seen on comparison CT appears unchanged.

## 2018-04-01 ENCOUNTER — Other Ambulatory Visit: Payer: Self-pay | Admitting: Internal Medicine

## 2018-04-01 DIAGNOSIS — Z1231 Encounter for screening mammogram for malignant neoplasm of breast: Secondary | ICD-10-CM

## 2018-05-03 ENCOUNTER — Ambulatory Visit
Admission: RE | Admit: 2018-05-03 | Discharge: 2018-05-03 | Disposition: A | Discharge status: Home / Self Care | Payer: Medicare Other | Source: Ambulatory Visit | Attending: Internal Medicine | Admitting: Internal Medicine

## 2018-05-03 DIAGNOSIS — Z1231 Encounter for screening mammogram for malignant neoplasm of breast: Secondary | ICD-10-CM

## 2018-06-15 ENCOUNTER — Ambulatory Visit: Payer: Medicare Other | Admitting: Pulmonary Disease

## 2018-06-15 ENCOUNTER — Encounter: Payer: Self-pay | Admitting: Pulmonary Disease

## 2018-06-15 VITALS — BP 114/72 | HR 95 | Ht 64.0 in | Wt 173.6 lb

## 2018-06-15 DIAGNOSIS — J9611 Chronic respiratory failure with hypoxia: Secondary | ICD-10-CM | POA: Diagnosis not present

## 2018-06-15 DIAGNOSIS — F17200 Nicotine dependence, unspecified, uncomplicated: Secondary | ICD-10-CM | POA: Diagnosis not present

## 2018-06-15 DIAGNOSIS — J432 Centrilobular emphysema: Secondary | ICD-10-CM | POA: Diagnosis not present

## 2018-06-15 DIAGNOSIS — F17210 Nicotine dependence, cigarettes, uncomplicated: Secondary | ICD-10-CM | POA: Diagnosis not present

## 2018-06-15 MED ORDER — FLUTICASONE FUROATE-VILANTEROL 100-25 MCG/INH IN AEPB: 1.0000 | INHALATION_SPRAY | Freq: Every day | RESPIRATORY_TRACT | 0 refills | Status: DC

## 2018-06-15 NOTE — Addendum Note (Signed)
Addended by: Valerie Salts on: 06/15/2018 02:53 PM   Modules accepted: Orders

## 2018-06-15 NOTE — Progress Notes (Signed)
Subjective:    Patient ID: Mercedes Thornton, female    DOB: 1945-11-23, 73 y.o.   MRN: [id number]  Synopsis: As of 06/15/2018 active smoker (has smoked 1 ppd since 19 years).  Unable to complete PFTs in January 2017   HPI Chief Complaint  Patient presents with  . Follow-up    breathing varies; has DOE, resolved w/ rest; still smoking, down to 5-10 cigs/day   She has cut back on smoking severely.  She says that she can now go as much as 7 days at a time without getting cigarette smoking.  She has mild dyspnea from time to time when she exerts herself, this is worse when it is warm outside.  However over th winter she really hasn't used albuterol very often.    Past Medical History:  Diagnosis Date  . Allergic rhinitis   . Bipolar affective disorder ([place])   . COPD (chronic obstructive pulmonary disease) ([place])   . Coronary artery disease 02/2015   50% RCA otherwise normal  . Cough 10/30/2011  . DM (diabetes mellitus) ([place])   . Hyperlipidemia   . Leukocytosis   . Mediastinal adenopathy 11/26/2011  . Pericardial effusion   . Postoperative atrial fibrillation ([place])   . Smoking   . SOB (shortness of breath) 10/30/2011      Review of Systems  Constitutional: Negative for chills, fatigue and fever.  HENT: Negative for mouth sores, nosebleeds and postnasal drip.   Respiratory: Positive for shortness of breath. Negative for cough and wheezing.   Cardiovascular: Negative for chest pain, palpitations and leg swelling.       Objective:   Physical Exam  Vitals:   06/15/18 1407  BP: 114/72  Pulse: 95  SpO2: 95%  Weight: 173 lb 9.6 oz (78.7 kg)  Height: 5\' 4"  (1.626 m)  RA  Gen: well appearing HENT: OP clear, TM's clear, neck supple PULM: CTA B, normal percussion CV: RRR, no mgr, trace edema GI: BS+, soft, nontender Derm: no cyanosis or rash Psyche: normal mood and affect   CT images personally reviewed from 2016 > centrilobular emphysema 08/2016 CT chest (screening)> RADS  2  CBC    Component Value Date/Time   WBC 14.5 (H) 08/23/2015 1313   WBC 13.4 (H) 06/13/2015 1102   RBC 4.76 08/23/2015 1313   RBC 4.80 06/13/2015 1102   HGB 13.8 08/23/2015 1313   HCT 42.0 08/23/2015 1313   PLT 262 08/23/2015 1313   MCV 88.2 08/23/2015 1313   MCH 29.0 08/23/2015 1313   MCH 28.5 06/13/2015 1102   MCHC 32.9 08/23/2015 1313   MCHC 33.8 06/13/2015 1102   RDW 15.0 (H) 08/23/2015 1313   LYMPHSABS 3.1 08/23/2015 1313   MONOABS 1.0 (H) 08/23/2015 1313   EOSABS 0.2 08/23/2015 1313   BASOSABS 0.1 08/23/2015 1313         Assessment & Plan:   Smoking  Centrilobular emphysema (HCC)  Moderate smoker (20 or less per day)  Chronic respiratory failure with hypoxia ([place])  Discussion: This has been a stable interval for Mercedes Thornton though she is experiencing a slight increase in dyspnea particularly in the summertime.  This is consistent with the natural history of emphysema i.e. slowly worsening with continued cigarette smoking.  We talked for at least 10 minutes today about various ways that she can quit smoking.  We talked about the nicotine content of electronic cigarettes, the Nicotrol inhaler, and quitting cold [place].  She says that she continues to smoke because it provides  stress relief for her.  Plan: Cigarette smoking: Continue to make efforts to quit Call 1-800-quit-NOW to get free nicotine replacement from the state of [place] Continue use the Nicotrol inhalers to help you wean off of cigarettes  COPD/centrilobular emphysema: Use albuterol as needed for chest tightness wheezing or shortness of breath Use Breo 1 puff daily in the summertime to see if this helps reduce your albuterol need Practice good hand hygiene Stay active Get a flu shot in the fall  We will see you back in 6 months or sooner if needed  Greater than 50% of this 27-minute visit spent face-to-face    Current Outpatient Medications:  .  albuterol (PROVENTIL HFA;VENTOLIN HFA)  108 (90 BASE) MCG/ACT inhaler, Inhale 2 puffs into the lungs every 6 (six) hours as needed for wheezing., Disp: , Rfl:  .  aspirin 81 MG tablet, Take 81 mg by mouth daily., Disp: , Rfl:  .  Cholecalciferol (VITAMIN D3) 3000 UNITS TABS, Take 1 tablet by mouth daily. , Disp: , Rfl:  .  Cinnamon 500 MG TABS, Take 1 tablet by mouth daily. , Disp: , Rfl:  .  clonazePAM (KLONOPIN) 1 MG tablet, Take 1 tablet by mouth daily., Disp: , Rfl:  .  fluticasone (FLONASE) 50 MCG/ACT nasal spray, Place 1 spray into both nostrils daily as needed for allergies. , Disp: , Rfl:  .  glimepiride (AMARYL) 4 MG tablet, Take 1 tablet by mouth daily., Disp: , Rfl:  .  lisinopril (PRINIVIL,ZESTRIL) 5 MG tablet, Take 0.5 tablets (2.5 mg total) by mouth daily., Disp: 30 tablet, Rfl: 0 .  lovastatin (MEVACOR) 10 MG tablet, Take 10 mg at bedtime by mouth., Disp: , Rfl:  .  metFORMIN (GLUCOPHAGE-XR) 500 MG 24 hr tablet, Take 500 mg by mouth 2 (two) times daily., Disp: , Rfl:  .  metoprolol tartrate (LOPRESSOR) 25 MG tablet, Take 1 tablet (25 mg total) by mouth 2 (two) times daily., Disp: 180 tablet, Rfl: 3 .  Multiple Vitamins-Minerals (WOMENS MULTIVITAMIN PLUS PO), Take 1 tablet by mouth daily. , Disp: , Rfl:  .  nicotine (NICOTROL) 10 MG inhaler, Inhale 1 Cartridge (1 continuous puffing total) as needed into the lungs for smoking cessation., Disp: 42 each, Rfl: 3 .  TURMERIC PO, Take 1 tablet by mouth daily., Disp: , Rfl:  .  vitamin C (ASCORBIC ACID) 500 MG tablet, Take 500 mg by mouth daily., Disp: , Rfl:

## 2018-06-15 NOTE — Patient Instructions (Signed)
Cigarette smoking: Continue to make efforts to quit Call 1-800-quit-NOW to get free nicotine replacement from the state of [place] Continue use the Nicotrol inhalers to help you wean off of cigarettes  COPD/centrilobular emphysema: Use albuterol as needed for chest tightness wheezing or shortness of breath Use Breo 1 puff daily in the summertime to see if this helps reduce your albuterol need Practice good hand hygiene Stay active Get a flu shot in the fall  We will see you back in 6 months or sooner if needed

## 2018-06-15 NOTE — Addendum Note (Signed)
Addended by: Valerie Salts on: 06/15/2018 03:46 PM   Modules accepted: Orders

## 2018-06-17 ENCOUNTER — Other Ambulatory Visit: Payer: Self-pay | Admitting: Acute Care

## 2018-06-17 DIAGNOSIS — F17210 Nicotine dependence, cigarettes, uncomplicated: Secondary | ICD-10-CM

## 2018-06-17 DIAGNOSIS — Z122 Encounter for screening for malignant neoplasm of respiratory organs: Secondary | ICD-10-CM

## 2018-06-17 DIAGNOSIS — Z87891 Personal history of nicotine dependence: Secondary | ICD-10-CM

## 2018-08-04 ENCOUNTER — Telehealth: Payer: Self-pay | Admitting: Pulmonary Disease

## 2018-08-04 MED ORDER — FLUTICASONE FUROATE-VILANTEROL 100-25 MCG/INH IN AEPB: 1.0000 | INHALATION_SPRAY | Freq: Every day | RESPIRATORY_TRACT | 4 refills | Status: DC

## 2018-08-04 NOTE — Telephone Encounter (Signed)
Called & spoke w/ pt in regards to [facility] Rx request & CXR. Last seen 06/15/2018 by BQ w/ recommendations to use Breo 1 puff daily in the summertime to help reduce albuterol need. Per pt request, reorder as been placed to preferred pharmacy [facility] [address] w/ x4 refills.   Also informed pt that CXR has been ordered and she may come in to have it done any time Monday-Friday 9-5. Pt verbalized understanding and stated she would like to hold off on that until COVID-19 is at [place].   Order for [facility] reorder has been placed. Nothing further needed at this time.

## 2018-08-06 ENCOUNTER — Telehealth: Payer: Self-pay | Admitting: Pulmonary Disease

## 2018-08-06 MED ORDER — FLUTICASONE FUROATE-VILANTEROL 100-25 MCG/INH IN AEPB: 1.0000 | INHALATION_SPRAY | Freq: Every day | RESPIRATORY_TRACT | 5 refills | Status: DC

## 2018-08-06 NOTE — Telephone Encounter (Signed)
Rx had been placed to be sent to pharmacy 4/22 but it was not changed from a sample to the status of normal so the pharmacy would be able to receive it. I have fixed this and have sent the Rx to pt's preferred pharmacy. Called and spoke with pt letting her know that the pharmacy should have the Rx now and pt expressed understanding. Nothing further needed.

## 2018-08-27 ENCOUNTER — Other Ambulatory Visit: Payer: Self-pay | Admitting: Cardiology

## 2018-08-27 NOTE — Telephone Encounter (Signed)
Pt's medication was sent to pt's pharmacy as requested. Confirmation received.  °

## 2018-08-30 ENCOUNTER — Ambulatory Visit: Payer: Medicare Other | Admitting: Cardiology

## 2018-11-29 ENCOUNTER — Other Ambulatory Visit: Payer: Self-pay | Admitting: Cardiology

## 2018-12-01 ENCOUNTER — Other Ambulatory Visit: Payer: Self-pay | Admitting: Cardiology

## 2018-12-02 NOTE — Telephone Encounter (Signed)
Outpatient Medication Detail   Disp Refills Start End   metoprolol tartrate (LOPRESSOR) 25 MG tablet 180 tablet 0 11/30/2018    Sig: TAKE 1 TABLET BY MOUTH TWICE DAILY(KEEP UPCOMING APPT IN AUGUST WITH DR TURNER FOR FUTURE REFILLS)   Sent to pharmacy as: metoprolol tartrate (LOPRESSOR) 25 MG tablet   E-Prescribing Status: Receipt confirmed by pharmacy (11/30/2018 5:06 PM EDT)   Pharmacy  [facility] ([place] [place]

## 2018-12-13 ENCOUNTER — Ambulatory Visit: Payer: Medicare Other | Admitting: Cardiology

## 2019-01-08 ENCOUNTER — Other Ambulatory Visit: Payer: Self-pay | Admitting: Pulmonary Disease

## 2019-01-17 ENCOUNTER — Ambulatory Visit (INDEPENDENT_AMBULATORY_CARE_PROVIDER_SITE_OTHER): Payer: Medicare Other | Admitting: Cardiology

## 2019-01-17 ENCOUNTER — Encounter: Payer: Self-pay | Admitting: Cardiology

## 2019-01-17 ENCOUNTER — Other Ambulatory Visit: Payer: Self-pay

## 2019-01-17 VITALS — BP 130/60 | HR 99 | Ht 64.0 in | Wt 178.0 lb

## 2019-01-17 DIAGNOSIS — E7800 Pure hypercholesterolemia, unspecified: Secondary | ICD-10-CM

## 2019-01-17 DIAGNOSIS — I313 Pericardial effusion (noninflammatory): Secondary | ICD-10-CM | POA: Diagnosis not present

## 2019-01-17 DIAGNOSIS — I4891 Unspecified atrial fibrillation: Secondary | ICD-10-CM

## 2019-01-17 DIAGNOSIS — I472 Ventricular tachycardia, unspecified: Secondary | ICD-10-CM

## 2019-01-17 DIAGNOSIS — I9789 Other postprocedural complications and disorders of the circulatory system, not elsewhere classified: Secondary | ICD-10-CM | POA: Diagnosis not present

## 2019-01-17 DIAGNOSIS — I3139 Other pericardial effusion (noninflammatory): Secondary | ICD-10-CM

## 2019-01-17 DIAGNOSIS — I2510 Atherosclerotic heart disease of native coronary artery without angina pectoris: Secondary | ICD-10-CM

## 2019-01-17 NOTE — Progress Notes (Signed)
Cardiology Office Note:    Date:  01/17/2019   ID:  Mercedes Thornton, DOB [birth year], MRN [id number]  PCP:  Antonietta Jewel, MD  Cardiologist:  No primary care provider on file.    Referring MD: Antonietta Jewel, MD   Chief Complaint  Patient presents with  . Coronary Artery Disease  . Hyperlipidemia  . Congestive Heart Failure    History of Present Illness:    Mercedes Thornton is a 73 y.o. female with a hx of  ongoing tobacco use, COPD, DM, dyslipidemia, pericardialeffusion, nonobstructive ASCAD with 50% RCA by cath 11/2016and bipolar disorder. She has a history ofpost op afib, diastolic CHF and NSVTafter appendectomy.   She is here today for followup and is doing well.  She denies any chest pain or pressure,  PND, orthopnea, LE edema, dizziness, palpitations or syncope.She has chronic DOE from her COPD and is on O2 at night.  She occasionally has wheezing.  Sometimes when she checks her O2 sats she notices her HR will be 110-120bpm when she is up doing her housework but denies any palpitations.  It goes back immediately to normal when she sits. She is compliant with her meds and is tolerating meds with no SE.    Past Medical History:  Diagnosis Date  . Allergic rhinitis   . Bipolar affective disorder ([place])   . COPD (chronic obstructive pulmonary disease) ([place])   . Coronary artery disease 02/2015   50% RCA otherwise normal  . Cough 10/30/2011  . DM (diabetes mellitus) ([place])   . Hyperlipidemia   . Leukocytosis   . Mediastinal adenopathy 11/26/2011  . Pericardial effusion   . Postoperative atrial fibrillation ([place])   . Smoking   . SOB (shortness of breath) 10/30/2011    Past Surgical History:  Procedure Laterality Date  . CARDIAC CATHETERIZATION N/A 02/15/2015   Procedure: Left Heart Cath and Coronary Angiography;  Surgeon: Leonie Man, MD;  Location: [facility];  Service: Cardiovascular;  Laterality: N/A;  . CATARACT EXTRACTION    . CESAREAN SECTION    . COLONOSCOPY   2008   negative per pt's report, with [facility]   . LAPAROSCOPIC APPENDECTOMY N/A 01/31/2015   Procedure: LYSIS OF ADHESIONS [place] LAPAROSCOPIC ;  Surgeon: Alphonsa Overall, MD;  Location: [facility];  Service: General;  Laterality: N/A;    Current Medications: Current Meds  Medication Sig  . albuterol (PROVENTIL HFA;VENTOLIN HFA) 108 (90 BASE) MCG/ACT inhaler Inhale 2 puffs into the lungs every 6 (six) hours as needed for wheezing.  [place] Kitchen aspirin 81 MG tablet Take 81 mg by mouth daily.  Marland Kitchen BREO ELLIPTA 100-25 MCG/INH AEPB TAKE 1 PUFF BY MOUTH EVERY DAY  . Cholecalciferol (VITAMIN D3) 3000 UNITS TABS Take 1 tablet by mouth daily.   . Cinnamon 500 MG TABS Take 1 tablet by mouth daily.   . clonazePAM (KLONOPIN) 1 MG tablet Take 1 tablet by mouth daily.  . fluticasone (FLONASE) 50 MCG/ACT nasal spray Place 1 spray into both nostrils daily as needed for allergies.   Marland Kitchen glimepiride (AMARYL) 4 MG tablet Take 1 tablet by mouth daily.  Marland Kitchen lisinopril (PRINIVIL,ZESTRIL) 5 MG tablet Take 0.5 tablets (2.5 mg total) by mouth daily.  Marland Kitchen lovastatin (MEVACOR) 10 MG tablet Take 10 mg at bedtime by mouth.  . metFORMIN (GLUCOPHAGE-XR) 500 MG 24 hr tablet Take 500 mg by mouth 2 (two) times daily.  . metoprolol tartrate (LOPRESSOR) 25 MG tablet TAKE 1 TABLET BY MOUTH TWICE DAILY(KEEP UPCOMING APPT IN  AUGUST WITH DR TURNER FOR FUTURE REFILLS)  . Multiple Vitamins-Minerals (WOMENS MULTIVITAMIN PLUS PO) Take 1 tablet by mouth daily.   . nicotine (NICOTROL) 10 MG inhaler Inhale 1 Cartridge (1 continuous puffing total) as needed into the lungs for smoking cessation.  . TURMERIC PO Take 1 tablet by mouth daily.  . vitamin C (ASCORBIC ACID) 500 MG tablet Take 500 mg by mouth daily.     Allergies:   Penicillins   Social History   Socioeconomic History  . Marital status: Divorced    Spouse name: Not on file  . Number of children: 2  . Years of education: Not on file  . Highest education level: Not on file   Occupational History    Comment: retired grade school/middle school teacher  Social Needs  . Financial resource strain: Not on file  . Food insecurity    Worry: Not on file    Inability: Not on file  . Transportation needs    Medical: Not on file    Non-medical: Not on file  Tobacco Use  . Smoking status: Current Every Day Smoker    Packs/day: 1.00    Years: 51.00    Pack years: 51.00    Types: Cigarettes  . Smokeless tobacco: Never Used  . Tobacco comment: pt down to 0.25ppd; as of 06/15/2018 smokes 5-10 cigarettes/day  Substance and Sexual Activity  . Alcohol use: No    Alcohol/week: 0.0 standard drinks  . Drug use: No  . Sexual activity: Not on file  Lifestyle  . Physical activity    Days per week: Not on file    Minutes per session: Not on file  . Stress: Not on file  Relationships  . Social Herbalist on phone: Not on file    Gets together: Not on file    Attends religious service: Not on file    Active member of club or organization: Not on file    Attends meetings of clubs or organizations: Not on file    Relationship status: Not on file  Other Topics Concern  . Not on file  Social History Narrative  . Not on file     Family History: The patient's family history includes Cancer in her father; Stroke in her mother. There is no history of Breast cancer.  ROS:   Please see the history of present illness.    ROS  All other systems reviewed and negative.   EKGs/Labs/Other Studies Reviewed:    The following studies were reviewed today: none  EKG:  EKG is  ordered today.  The ekg ordered today demonstrates NSR at 99bpm with no ST changes  Recent Labs: No results found for requested labs within last 8760 hours.   Recent Lipid Panel    Component Value Date/Time   CHOL 142 08/20/2017 1151   TRIG 110 08/20/2017 1151   HDL 46 08/20/2017 1151   CHOLHDL 3.1 08/20/2017 1151   CHOLHDL 2.0 06/13/2015 1056   VLDL 18 06/13/2015 1056   LDLCALC 74  08/20/2017 1151    Physical Exam:    VS:  BP 130/60   Pulse 99   Ht 5\' 4"  (1.626 m)   Wt 178 lb (80.7 kg)   BMI 30.55 kg/m     Wt Readings from Last 3 Encounters:  01/17/19 178 lb (80.7 kg)  06/15/18 173 lb 9.6 oz (78.7 kg)  08/14/17 178 lb (80.7 kg)     GEN:  Well nourished, well developed in  no acute distress HEENT: Normal NECK: No JVD; No carotid bruits LYMPHATICS: No lymphadenopathy CARDIAC: RRR, no murmurs, rubs, gallops RESPIRATORY:  Clear to auscultation without rales, wheezing or rhonchi  ABDOMEN: Soft, non-tender, non-distended MUSCULOSKELETAL:  No edema; No deformity  SKIN: Warm and dry NEUROLOGIC:  Alert and oriented x 3 PSYCHIATRIC:  Normal affect   ASSESSMENT:    1. Coronary artery disease involving native coronary artery of native heart without angina pectoris   2. Ventricular tachyarrhythmia ([place])   3. Postoperative atrial fibrillation (HCC)   4. Pericardial effusion   5. Pure hypercholesterolemia    PLAN:    In order of problems listed above:  1.  ASCAD -nonobstructive ASCAD with 50% RCA by cath 02/2015 -she has not had any anginal sx -continue ASA 81mg  daily, BB and statin  2.  Ventricular tachycardia -no reoccurrence of palpitations -continue Lopressor 25mg  BID  3.  Postop afib -occurred after appenectomy with no further episodes -not on anticoagulation since occurred in the post op setting with no reoccurrence -HR up some with ambulation but likely related to deconditioning and underlying lung dz -continue BB  4.  Pericardial effusion -mild posterior effusion by echo 2018 and was supposed to have repeat echo but this was never done -repeat echo to reassess  5.  HLD -LDL goal < 70 -repeat FLP and ALT -continue lovastatin 10mg  daily   Medication Adjustments/Labs and Tests Ordered: Current medicines are reviewed at length with the patient today.  Concerns regarding medicines are outlined above.  Orders Placed This Encounter   Procedures  . EKG 12-Lead   No orders of the defined types were placed in this encounter.   Signed, Fransico Him, MD  01/17/2019 3:19 PM    [place]

## 2019-01-17 NOTE — Patient Instructions (Addendum)
Medication Instructions:   If you need a refill on your cardiac medications before your next appointment, please call your pharmacy.   Lab work: Your physician recommends that you return for lab work in: when scheduled for echocardiogram need fasting lipid panel and ALT  If you have labs (blood work) drawn today and your tests are completely normal, you will receive your results only by: Marland Kitchen MyChart Message (if you have MyChart) OR . A paper copy in the mail If you have any lab test that is abnormal or we need to change your treatment, we will call you to review the results.  Testing/Procedures: Your physician has requested that you have an echocardiogram in the morning and on the same day as lab work. Echocardiography is a painless test that uses sound waves to create images of your heart. It provides your doctor with information about the size and shape of your heart and how well your heart's chambers and valves are working. This procedure takes approximately one hour. There are no restrictions for this procedure.  Follow-Up: At [facility], you and your health needs are our priority.  As part of our continuing mission to provide you with exceptional heart care, we have created designated Provider Care Teams.  These Care Teams include your primary Cardiologist (physician) and Advanced Practice Providers (APPs -  Physician Assistants and Nurse Practitioners) who all work together to provide you with the care you need, when you need it. You will need a follow up appointment in 12 months.  Please call our office 2 months in advance to schedule this appointment.  You may see Dr. Radford Pax or one of the following Advanced Practice Providers on your designated Care Team:   [place], PA-C Melina Copa, PA-C . Ermalinda Barrios, PA-C

## 2019-01-19 ENCOUNTER — Telehealth: Payer: Self-pay | Admitting: Primary Care

## 2019-01-19 ENCOUNTER — Telehealth: Payer: Self-pay | Admitting: Cardiology

## 2019-01-19 NOTE — Telephone Encounter (Signed)
Did not tell office a/b copd problems.Mercedes Thornton

## 2019-01-19 NOTE — Telephone Encounter (Signed)
New message   1. What dental office are you calling from? Dr. Towana Badger DDS  2. What is your office phone number? [phone]  3. What is your fax number? [phone]  4. What type of procedure is the patient having performed? Impression for Denture and 3 extractions   5. What date is procedure scheduled or is the patient there now? now (if the patient is at the dentist's office question goes to their cardiologist if he/she is in the office.  If not, question should go to the DOD).   6. What is your question (ex. Antibiotics prior to procedure, holding medication-we need to know how long dentist wants pt to hold med)? Need to know if patient can have nitro and if she can have extractions?

## 2019-01-19 NOTE — Telephone Encounter (Signed)
Dr. Radford Pax spoke with Dr. Charlie Pitter office and stated the pt is cleared from a cardiac standpoint for extraction, but they are requesting clearance for her COPD, and Dr. Radford Pax advised Dr. Charlie Pitter office that they will need to reach out to the pts PCP or Pulmonary MD, for further advise on that clearance.  Per the Dental Assist at Dr. Charlie Pitter office, they want Dr. Radford Pax to write on a form that the pt is clear from a cardiac/med perspective to proceed with extraction, and they will contact the pts PCP for the COPD clearance.  Advised the Assistant to fax [place] this note for Dr Radford Pax to advise on to [phone] ATTN: Dr. Radford Pax. Dental assistant verbalized understanding and agrees with this plan.

## 2019-01-19 NOTE — Telephone Encounter (Signed)
Call returned to Dr. Wendee Beavers office, she is currently at their office, she reports the patient states she recently had a EKG, she is there to get extractions. She denies that the patient is having any symptoms. She states the patient did not tell them about her COPD or recent tests from cardiology. They wanted to know whether it is okay for the patient to take nitro and is it safe for her to have the extractions done. She reports the patient states she was recently seen by cardiology. I made her aware unfortunately these things will need to be addressed with her cardiologist. I made her aware we have not seen this patient since march so we are not able to make a decision regarding clearance however we are more than willing to have her come in for a visit for pulmonary clearance if needed. Voiced understanding. They are going to call her cardiologists office. Nothing further needed at this time.

## 2019-01-25 ENCOUNTER — Encounter: Payer: Self-pay | Admitting: Acute Care

## 2019-02-21 ENCOUNTER — Other Ambulatory Visit: Payer: Self-pay | Admitting: Cardiology

## 2019-02-21 ENCOUNTER — Telehealth: Payer: Self-pay | Admitting: Cardiology

## 2019-02-21 NOTE — Telephone Encounter (Signed)
°*  STAT* If patient is at the pharmacy, call can be transferred to refill team.   1. Which medications need to be refilled? (please list name of each medication and dose if known)  metoprolol tartrate (LOPRESSOR) 25 MG tablet   2. Which pharmacy/location (including street and city if local pharmacy) is medication to be sent to? [id number] - [place] ([place]), [place] - [address]  3. Do they need a 30 day or 90 day supply? 90    

## 2019-02-22 NOTE — Telephone Encounter (Signed)
Outpatient Medication Detail   Disp Refills Start End   metoprolol tartrate (LOPRESSOR) 25 MG tablet 180 tablet 0 02/21/2019    Sig - Route: Take 1 tablet (25 mg total) by mouth 2 (two) times daily. - Oral   Sent to pharmacy as: metoprolol tartrate (LOPRESSOR) 25 MG tablet   E-Prescribing Status: Receipt confirmed by pharmacy (02/21/2019 4:54 PM EST)   Pharmacy  [place] ([place]), [place] - [place]

## 2019-02-28 ENCOUNTER — Other Ambulatory Visit: Payer: Medicare Other

## 2019-02-28 ENCOUNTER — Other Ambulatory Visit (HOSPITAL_COMMUNITY): Payer: Medicare Other

## 2019-04-21 ENCOUNTER — Other Ambulatory Visit: Payer: Self-pay

## 2019-04-21 ENCOUNTER — Ambulatory Visit (HOSPITAL_COMMUNITY): Payer: Medicare PPO | Attending: Cardiology

## 2019-04-21 ENCOUNTER — Other Ambulatory Visit: Payer: Medicare PPO | Admitting: *Deleted

## 2019-04-21 DIAGNOSIS — I9789 Other postprocedural complications and disorders of the circulatory system, not elsewhere classified: Secondary | ICD-10-CM | POA: Diagnosis present

## 2019-04-21 DIAGNOSIS — I472 Ventricular tachycardia, unspecified: Secondary | ICD-10-CM

## 2019-04-21 DIAGNOSIS — I4891 Unspecified atrial fibrillation: Secondary | ICD-10-CM | POA: Insufficient documentation

## 2019-04-21 DIAGNOSIS — E7800 Pure hypercholesterolemia, unspecified: Secondary | ICD-10-CM | POA: Diagnosis present

## 2019-04-21 DIAGNOSIS — I2510 Atherosclerotic heart disease of native coronary artery without angina pectoris: Secondary | ICD-10-CM | POA: Insufficient documentation

## 2019-04-21 DIAGNOSIS — I3139 Other pericardial effusion (noninflammatory): Secondary | ICD-10-CM

## 2019-04-21 DIAGNOSIS — I313 Pericardial effusion (noninflammatory): Secondary | ICD-10-CM | POA: Diagnosis present

## 2019-04-21 LAB — LIPID PANEL
Chol/HDL Ratio: 3.7 ratio (ref 0.0–4.4)
Cholesterol, Total: 168 mg/dL (ref 100–199)
HDL: 45 mg/dL (ref >39)
LDL Chol Calc (NIH): 94 mg/dL (ref 0–99)
Triglycerides: 166 mg/dL — ABNORMAL HIGH (ref 0–149)
VLDL Cholesterol Cal: 29 mg/dL (ref 5–40)

## 2019-04-21 LAB — ALT: ALT: 9 IU/L (ref 0–32)

## 2019-04-22 ENCOUNTER — Telehealth: Payer: Self-pay

## 2019-04-22 DIAGNOSIS — I2510 Atherosclerotic heart disease of native coronary artery without angina pectoris: Secondary | ICD-10-CM

## 2019-04-22 DIAGNOSIS — E7800 Pure hypercholesterolemia, unspecified: Secondary | ICD-10-CM

## 2019-04-22 MED ORDER — ATORVASTATIN CALCIUM 20 MG PO TABS: 20.0000 mg | ORAL_TABLET | Freq: Every day | ORAL | 3 refills | Status: DC

## 2019-04-22 NOTE — Telephone Encounter (Signed)
The patient has been notified of the result and verbalized understanding.  All questions (if any) were answered. Antonieta Iba, RN 04/22/2019 9:53 AM

## 2019-04-23 ENCOUNTER — Other Ambulatory Visit: Payer: Self-pay | Admitting: Pulmonary Disease

## 2019-04-25 ENCOUNTER — Telehealth: Payer: Self-pay | Admitting: Cardiology

## 2019-04-25 NOTE — Telephone Encounter (Signed)
Patient calling for echo and lab results.

## 2019-04-25 NOTE — Telephone Encounter (Signed)
The patient has been notified of the result and verbalized understanding.  All questions (if any) were answered. Antonieta Iba, RN 04/25/2019 4:34 PM

## 2019-04-27 ENCOUNTER — Ambulatory Visit (INDEPENDENT_AMBULATORY_CARE_PROVIDER_SITE_OTHER): Payer: Medicare PPO | Admitting: Acute Care

## 2019-04-27 ENCOUNTER — Other Ambulatory Visit: Payer: Self-pay

## 2019-04-27 ENCOUNTER — Encounter: Payer: Self-pay | Admitting: Acute Care

## 2019-04-27 VITALS — BP 110/58 | HR 85 | Temp 98.4°F | Ht 64.0 in | Wt 169.0 lb

## 2019-04-27 DIAGNOSIS — J9611 Chronic respiratory failure with hypoxia: Secondary | ICD-10-CM

## 2019-04-27 DIAGNOSIS — J432 Centrilobular emphysema: Secondary | ICD-10-CM

## 2019-04-27 DIAGNOSIS — J441 Chronic obstructive pulmonary disease with (acute) exacerbation: Secondary | ICD-10-CM

## 2019-04-27 MED ORDER — TRELEGY ELLIPTA 100-62.5-25 MCG/INH IN AEPB: 1.0000 | INHALATION_SPRAY | Freq: Every day | RESPIRATORY_TRACT | 0 refills | Status: DC

## 2019-04-27 MED ORDER — ALBUTEROL SULFATE HFA 108 (90 BASE) MCG/ACT IN AERS: 2.0000 | INHALATION_SPRAY | Freq: Four times a day (QID) | RESPIRATORY_TRACT | 3 refills | Status: DC | PRN

## 2019-04-27 NOTE — Progress Notes (Signed)
History of Present Illness Mercedes Thornton is a 74 y.o. female current every day smoker with COPD, emphysema, and chronic respiratory failure with hypoxia. She is followed by Dr. Lake Bells. She needs to be reassigned to another physician. Currently uses nocturnal oxygen. She was  followed through the [facility] program. She did not have follow up scanning in 2019 or 2020. Maintenance medications Breo Albuterol as rescue  Currently smoking 5-6  cigarettes daily   05/04/2019 6 month follow up Pt. Presents for 6 month follow up. She was last seen in the office 06/2018. Per nursing she dropped her oxygen sat to 88% with walk to the exam room. She states she has been doing well in the interval. She has been compliant with her Breo. She states she rarely uses her rescue inhaler. She did drop her sats on the way to the exam room. She feels this is due to deconditioning. Her weight is about 4 pounds less, she states she is eating more health foods. She had been doing yoga and she feels since she stopped she is very deconditioned. She plans on resuming this as she feels it made a difference in her ability to be active.  She states she does have allergies. She is taking claritin daily. She endorses post nasal gtt. She is coughing with this. She denies any fever, chest pain, no exposures to anyone who has been sick. She states she does have episodes of wheezing while the dry heat is on.  She states she uses her oxygen at 2 L prn, and she wears her oxygen at bedtime.  Pt is refusing her flu shot today.  She does not believe she will get the covid vaccine either.  I did let her know that with her underlying COPD and emphysema, she is at high risk for breathing complications with COVID 19.  Test Results: LDCT 08/2016 IMPRESSION: Lung-RADS 2, benign appearance or behavior. Continue annual screening with low-dose chest CT without contrast in 12 months. Small pericardial effusion. Similar mild thoracic  adenopathy, favoring a benign etiology.  CBC Latest Ref Rng & Units 08/23/2015 06/13/2015 02/16/2015  WBC 3.9 - 10.3 10e3/uL 14.5(H) 13.4(H) 10.3  Hemoglobin 11.6 - 15.9 g/dL 13.8 13.7 11.0(L)  Hematocrit 34.8 - 46.6 % 42.0 40.5 35.0(L)  Platelets 145 - 400 10e3/uL 262 357 576(H)    BMP Latest Ref Rng & Units 08/23/2015 06/13/2015 04/25/2015  Glucose 70 - 140 mg/dl 175(H) 219(H) 196(H)  BUN 7.0 - 26.0 mg/dL 9.8 13 18   Creatinine 0.6 - 1.1 mg/dL 0.8 0.75 0.74  Sodium 136 - 145 mEq/L 143 140 136  Potassium 3.5 - 5.1 mEq/L 3.7 4.2 3.9  Chloride 98 - 110 mmol/L - 104 103  CO2 22 - 29 mEq/L 27 24 21   Calcium 8.4 - 10.4 mg/dL 9.7 9.7 9.8    BNP    Component Value Date/Time   BNP 487.5 (H) 02/01/2015 1530    ProBNP No results found for: PROBNP  PFT    Component Value Date/Time   FEV1PRE 1.39 04/17/2015 1508   FVCPRE 1.89 04/17/2015 1508   PREFEV1FVCRT 73 04/17/2015 1508    ECHOCARDIOGRAM COMPLETE  Result Date: 04/21/2019   ECHOCARDIOGRAM REPORT   Patient Name:   Mercedes Thornton Date of Exam: 04/21/2019 Medical Rec #:  [id number]     Height:       64.0 in Accession #:    [id number]    Weight:       178.0 lb Date of Birth:  1946-03-26     BSA:          1.86 m Patient Age:    73 years      BP:           130/60 mmHg Patient Gender: F             HR:           75 bpm. Exam Location:  [address] Procedure: 2D Echo, Cardiac Doppler, Color Doppler and Strain Analysis Indications:    I48.91 Atrial Fibrillation  History:        Patient has prior history of Echocardiogram examinations, most                 recent 08/28/2016. Risk Factors:Diabetes and Dyslipidemia.                 Shortness of breath. COPD. Coronary artery disease.                 Pericardial effusion.  Sonographer:    Wilford Sports Rodgers-Jones RDCS Referring Phys: [place]  1. Left ventricular ejection fraction, by visual estimation, is 60 to 65%. The left ventricle has normal function. There is mildly increased left  ventricular hypertrophy.  2. The average left ventricular global longitudinal strain is -21.3 %.  3. Global right ventricle has mildly reduced systolic function.The right ventricular size is normal. No increase in right ventricular wall thickness.  4. Left atrial size was normal.  5. Right atrial size was normal.  6. Trivial pericardial effusion is present. Measures <27mm adjacent to LV lateral wall at end diastole  7. The mitral valve is normal in structure. No evidence of mitral valve regurgitation. No evidence of mitral stenosis.  8. The tricuspid valve is normal in structure.  9. The aortic valve is tricuspid. Aortic valve regurgitation is not visualized. Mild to moderate aortic valve sclerosis/calcification without any evidence of aortic stenosis. 10. The pulmonic valve was not well visualized. Pulmonic valve regurgitation is not visualized. 11. The inferior vena cava is normal in size with greater than 50% respiratory variability, suggesting right atrial pressure of 3 mmHg. 12. TR signal is inadequate for assessing pulmonary artery systolic pressure. FINDINGS  Left Ventricle: Left ventricular ejection fraction, by visual estimation, is 60 to 65%. The left ventricle has normal function. The average left ventricular global longitudinal strain is -21.3 %. The left ventricle has no regional wall motion abnormalities. There is mildly increased left ventricular hypertrophy. Left ventricular diastolic parameters were normal. Normal left atrial pressure. Right Ventricle: The right ventricular size is normal. No increase in right ventricular wall thickness. Global RV systolic function is has mildly reduced systolic function. Left Atrium: Left atrial size was normal in size. Right Atrium: Right atrial size was normal in size Pericardium: Trivial pericardial effusion is present. Mitral Valve: The mitral valve is normal in structure. There is mild thickening of the mitral valve leaflet(s). No evidence of mitral valve  regurgitation. No evidence of mitral valve stenosis by observation. Tricuspid Valve: The tricuspid valve is normal in structure. Tricuspid valve regurgitation is trivial. Aortic Valve: The aortic valve is tricuspid. Aortic valve regurgitation is not visualized. Mild to moderate aortic valve sclerosis/calcification is present, without any evidence of aortic stenosis. Pulmonic Valve: The pulmonic valve was not well visualized. Pulmonic valve regurgitation is not visualized. Pulmonic regurgitation is not visualized. Aorta: The aortic root is normal in size and structure. Venous: The inferior vena cava is normal in size with  greater than 50% respiratory variability, suggesting right atrial pressure of 3 mmHg. IAS/Shunts: The interatrial septum was not well visualized.  LEFT VENTRICLE PLAX 2D LVIDd:         3.40 cm  Diastology LVIDs:         2.20 cm  LV e' lateral:   6.96 cm/s LV PW:         1.10 cm  LV E/e' lateral: 9.0 LV IVS:        1.10 cm  LV e' medial:    3.81 cm/s LVOT diam:     2.00 cm  LV E/e' medial:  16.4 LV SV:         31 ml LV SV Index:   16.13    2D Longitudinal Strain LVOT Area:     3.14 cm 2D Strain GLS (A2C):   -19.2 %                         2D Strain GLS (A3C):   -22.6 %                         2D Strain GLS (A4C):   -22.2 %                         2D Strain GLS Avg:     -21.3 % RIGHT VENTRICLE RV Basal diam:  3.60 cm RV S prime:     9.70 cm/s TAPSE (M-mode): 1.5 cm LEFT ATRIUM             Index       RIGHT ATRIUM           Index LA diam:        4.00 cm 2.15 cm/m  RA Area:     10.80 cm LA Vol (A2C):   64.6 ml 34.70 ml/m RA Volume:   26.10 ml  14.02 ml/m LA Vol (A4C):   46.7 ml 25.08 ml/m LA Biplane Vol: 57.1 ml 30.67 ml/m  AORTIC VALVE LVOT Vmax:   93.35 cm/s LVOT Vmean:  63.350 cm/s LVOT VTI:    0.200 m  AORTA Ao Root diam: 2.80 cm MITRAL VALVE MV Area (PHT): 2.45 cm             SHUNTS MV PHT:        89.90 msec           Systemic VTI:  0.20 m MV Decel Time: 310 msec             Systemic Diam:  2.00 cm MV E velocity: 62.60 cm/s 103 cm/s MV A velocity: 92.10 cm/s 70.3 cm/s MV E/A ratio:  0.68       1.5  Oswaldo Milian MD Electronically signed by Oswaldo Milian MD Signature Date/Time: 04/21/2019/8:54:43 PM    Final      Past medical hx Past Medical History:  Diagnosis Date  . Allergic rhinitis   . Bipolar affective disorder ([place])   . COPD (chronic obstructive pulmonary disease) ([place])   . Coronary artery disease 02/2015   50% RCA otherwise normal  . Cough 10/30/2011  . DM (diabetes mellitus) ([place])   . Hyperlipidemia   . Leukocytosis   . Mediastinal adenopathy 11/26/2011  . Pericardial effusion   . Postoperative atrial fibrillation ([place])   . Smoking   . SOB (shortness of breath) 10/30/2011     Social History   Tobacco  Use  . Smoking status: Current Every Day Smoker    Packs/day: 1.00    Years: 51.00    Pack years: 51.00    Types: Cigarettes  . Smokeless tobacco: Never Used  . Tobacco comment: pt down to 0.25ppd; as of 06/15/2018 smokes 5-10 cigarettes/day  Substance Use Topics  . Alcohol use: No    Alcohol/week: 0.0 standard drinks  . Drug use: No    Ms.Goldsborough reports that she has been smoking cigarettes. She has a 51.00 pack-year smoking history. She has never used smokeless tobacco. She reports that she does not drink alcohol or use drugs.  Tobacco Cessation: Current every day smoker, states she is down to 4-5 cigarettes daily  Past surgical hx, Family hx, Social hx all reviewed.  Current Outpatient Medications on File Prior to Visit  Medication Sig  . aspirin 81 MG tablet Take 81 mg by mouth daily.  Marland Kitchen atorvastatin (LIPITOR) 20 MG tablet Take 1 tablet (20 mg total) by mouth daily.  Marland Kitchen BREO ELLIPTA 100-25 MCG/INH AEPB INHALE 1 PUFF BY MOUTH EVERY DAY  . Cholecalciferol (VITAMIN D3) 3000 UNITS TABS Take 1 tablet by mouth daily.   . Cinnamon 500 MG TABS Take 1 tablet by mouth daily.   . clindamycin (CLEOCIN) 150 MG capsule Take 150 mg by mouth 2 (two)  times daily.  . clonazePAM (KLONOPIN) 1 MG tablet Take 1 tablet by mouth daily.  . fluticasone (FLONASE) 50 MCG/ACT nasal spray Place 1 spray into both nostrils daily as needed for allergies.   Marland Kitchen glimepiride (AMARYL) 4 MG tablet Take 1 tablet by mouth daily.  Marland Kitchen lovastatin (MEVACOR) 10 MG tablet Take 10 mg at bedtime by mouth.  . metFORMIN (GLUCOPHAGE-XR) 500 MG 24 hr tablet Take 500 mg by mouth 2 (two) times daily.  . metoprolol tartrate (LOPRESSOR) 25 MG tablet Take 1 tablet (25 mg total) by mouth 2 (two) times daily.  . Multiple Vitamins-Minerals (WOMENS MULTIVITAMIN PLUS PO) Take 1 tablet by mouth daily.   . nicotine (NICOTROL) 10 MG inhaler Inhale 1 Cartridge (1 continuous puffing total) as needed into the lungs for smoking cessation.  . TURMERIC PO Take 1 tablet by mouth daily.  . vitamin C (ASCORBIC ACID) 500 MG tablet Take 500 mg by mouth daily.  . metFORMIN (GLUCOPHAGE) 500 MG tablet Take 1,000 mg by mouth 2 (two) times daily.   No current facility-administered medications on file prior to visit.     Allergies  Allergen Reactions  . Penicillins Hives and Swelling    Has patient had a PCN reaction causing immediate rash, facial/tongue/throat swelling, SOB or lightheadedness with hypotension: Yes Has patient had a PCN reaction causing severe rash involving mucus membranes or skin necrosis: Yes Has patient had a PCN reaction that required hospitalization Yes- hospital for 7 days Has patient had a PCN reaction occurring within the last 10 years: No If all of the above answers are "NO", then may proceed with Cephalosporin use.     Review Of Systems:  Constitutional:   No  weight loss, night sweats,  Fevers, chills, fatigue, or  lassitude.  HEENT:   No headaches,  Difficulty swallowing,  Tooth/dental problems, or  Sore throat,                No sneezing, itching, ear ache, nasal congestion, post nasal drip,   CV:  No chest pain,  Orthopnea, PND, swelling in lower extremities,  anasarca, dizziness, palpitations, syncope.   GI  No heartburn, indigestion, abdominal  pain, nausea, vomiting, diarrhea, change in bowel habits, loss of appetite, bloody stools.   Resp: + shortness of breath with exertion or at rest.  + excess mucus, + productive cough,  No non-productive cough,  No coughing up of blood.  No change in color of mucus.  + occasional  wheezing.  No chest wall deformity  Skin: no rash or lesions.  GU: no dysuria, change in color of urine, no urgency or frequency.  No flank pain, no hematuria   MS:  No joint pain or swelling.  + decreased range of motion.  No back pain.  Psych:  No change in mood or affect. No depression or anxiety.  No memory loss.   Vital Signs BP (!) 110/58 (BP Location: Left Arm, Cuff Size: Normal)   Pulse 85   Temp 98.4 F (36.9 C) (Oral)   Ht 5\' 4"  (1.626 m)   Wt 169 lb (76.7 kg)   SpO2 90%   BMI 29.01 kg/m    Physical Exam:  General- No distress,  A&Ox3, pleasant ENT: No sinus tenderness, TM clear, pale nasal mucosa, no oral exudate,no post nasal drip, no LAN Cardiac: S1, S2, regular rate and rhythm, no murmur Chest: + wheeze/ rales/ dullness; no accessory muscle use, no nasal flaring, no sternal retractions, diminished per bases Abd.: Soft Non-tender, ND, BS+, Body mass index is 29.01 kg/m. Ext: No clubbing cyanosis, edema Neuro:  Deconditioned, MAE x 4, A&O x 3, appropriate Skin: No rashes, No lesions,  warm and dry Psych: normal mood and behavior   Assessment/Plan  COPD with acute exacerbation (HCC) Severe COPD Plan Continue Albuterol rescue:  Inhale 2 puffs into the lungs every 6 (six) hours as needed for wheezing or shortness of brearth. We will send in a prescription for your Ventolin to be refilled.  Follow up Lung Cancer Screening Scan ( Pt. Prefers afternoons any day next week except 1/20)  We will order PFT's to evaluate for progression of your COPD You will get a call  To schedule a COVID test and  PFT. We will do a therapeutic trial of Trelegy. Use this 1 puff once daily Rinse mouth after use. Do not use Breo while using the Trelegy. We will see you back in the office in 1 month  to see if you feel this has helped. You do qualify for home oxygen use as you dropped you oxygen saturations to 88% on room air. This enables you to use your oxygen day or night.  Follow up with Judson Roch NP in 1 month to assess how you like the trelegy. Pt. Prefers [facility] [address] Please contact office for sooner follow up if symptoms do not improve or worsen or seek emergency care. Let us know if you change your mind about COVID or flu vaccine.     Chronic respiratory failure (HCC) Desaturation in the office today with walking to the exam room ( Sats to 87%) Plan Continue wearing oxygen at 2 L [place] during the day and at bedtime Saturation goal is 88-92%  I provided 30 minutes of care today  Magdalen Spatz, NP 05/04/2019  5:25 PM

## 2019-04-27 NOTE — Patient Instructions (Addendum)
It is good to see you today. Continue Albuterol rescue:  Inhale 2 puffs into the lungs every 6 (six) hours as needed for wheezing or shortness of brearth. We will send in a prescription for your Ventolin to be refilled.  Follow up Lung Cancer Screening Scan ( Pt. Prefers afternoons any day next week except 1/20)  We will order PFT's to evaluate for progression of your COPD You will get a call  To schedule a COVID test and PFT. We will do a therapeutic trial of Trelegy. Use this 1 puff once daily Rinse mouth after use. Do not use Breo while using the Trelegy. We will see you back in the office in 1 month  to see if you feel this has helped. You do qualify for home oxygen use as you dropped you oxygen saturations to 88% on room air.  Follow up with Judson Roch NP in 1 month to assess how you like the trelegy. Pt. Prefers [facility] [address] Please contact office for sooner follow up if symptoms do not improve or worsen or seek emergency care. Let us know if you change your mind about COVID or flu vaccine.

## 2019-05-02 ENCOUNTER — Telehealth: Payer: Self-pay | Admitting: Acute Care

## 2019-05-02 ENCOUNTER — Telehealth: Payer: Self-pay | Admitting: Cardiology

## 2019-05-02 MED ORDER — TRELEGY ELLIPTA 100-62.5-25 MCG/INH IN AEPB: 1.0000 | INHALATION_SPRAY | Freq: Every day | RESPIRATORY_TRACT | 0 refills | Status: DC

## 2019-05-02 NOTE — Telephone Encounter (Signed)
We are recommending the COVID-19 vaccine to all of our patients. Cardiac medications (including blood thinners) should not deter anyone from being vaccinated and there is no need to hold any of those medications prior to vaccine administration.     Currently, there is a hotline to call (active 04/22/19) to schedule vaccination appointments as no walk-ins will be accepted.   Number: [phone]    If you have further questions or concerns about the vaccine process, please visit [email or web address] or contact your primary care physician.  I have informed patient of instructions.         

## 2019-05-02 NOTE — Telephone Encounter (Signed)
I called and spoke with the patient in regards to the Trelegy that she was tried on in the place of Breo. She reports that the trelegy has helped her better and she would like to get more samples. We have some here and if its ok with you I will place some up front for her. She states that she will be getting the Covid vaccine at some point after she speaks with the [facility].

## 2019-05-02 NOTE — Telephone Encounter (Signed)
Please give her more samples if we have some available. She will need a prescription as we cannot guarantee sample use  Thanks

## 2019-05-02 NOTE — Telephone Encounter (Signed)
Samples have been placed upfront and I advised the patient of this when she called. I advised her that she will need to call in to get a script before she runs out of her samples.

## 2019-05-04 ENCOUNTER — Encounter: Payer: Self-pay | Admitting: Acute Care

## 2019-05-04 DIAGNOSIS — J441 Chronic obstructive pulmonary disease with (acute) exacerbation: Secondary | ICD-10-CM | POA: Insufficient documentation

## 2019-05-04 NOTE — Assessment & Plan Note (Signed)
Severe COPD Plan Continue Albuterol rescue:  Inhale 2 puffs into the lungs every 6 (six) hours as needed for wheezing or shortness of brearth. We will send in a prescription for your Ventolin to be refilled.  Follow up Lung Cancer Screening Scan ( Pt. Prefers afternoons any day next week except 1/20)  We will order PFT's to evaluate for progression of your COPD You will get a call  To schedule a COVID test and PFT. We will do a therapeutic trial of Trelegy. Use this 1 puff once daily Rinse mouth after use. Do not use Breo while using the Trelegy. We will see you back in the office in 1 month  to see if you feel this has helped. You do qualify for home oxygen use as you dropped you oxygen saturations to 88% on room air. This enables you to use your oxygen day or night.  Follow up with Judson Roch NP in 1 month to assess how you like the trelegy. Pt. Prefers [facility] [address] Please contact office for sooner follow up if symptoms do not improve or worsen or seek emergency care. Let us know if you change your mind about COVID or flu vaccine.

## 2019-05-04 NOTE — Assessment & Plan Note (Signed)
Desaturation in the office today with walking to the exam room ( Sats to 87%) Plan Continue wearing oxygen at 2 L [place] during the day and at bedtime Saturation goal is 88-92%

## 2019-05-17 ENCOUNTER — Ambulatory Visit (INDEPENDENT_AMBULATORY_CARE_PROVIDER_SITE_OTHER)
Admission: RE | Admit: 2019-05-17 | Discharge: 2019-05-17 | Disposition: A | Discharge status: Home / Self Care | Payer: Medicare PPO | Source: Ambulatory Visit | Attending: Acute Care | Admitting: Acute Care

## 2019-05-17 ENCOUNTER — Other Ambulatory Visit: Payer: Self-pay

## 2019-05-17 DIAGNOSIS — Z87891 Personal history of nicotine dependence: Secondary | ICD-10-CM

## 2019-05-17 DIAGNOSIS — Z122 Encounter for screening for malignant neoplasm of respiratory organs: Secondary | ICD-10-CM

## 2019-05-17 DIAGNOSIS — F17210 Nicotine dependence, cigarettes, uncomplicated: Secondary | ICD-10-CM

## 2019-05-18 NOTE — Progress Notes (Signed)
Please call patient and let them  know their  low dose Ct was read as a Lung RADS 2: nodules that are benign in appearance and behavior with a very low likelihood of becoming a clinically active cancer due to size or lack of growth. Recommendation per radiology is for a repeat LDCT in 12 months. Please let them  know we will order and schedule their  annual screening scan for 05/2020. Please let them  know there was notation of CAD on their  scan.  Please remind the patient  that this is a non-gated exam therefore degree or severity of disease  cannot be determined. Please have them  follow up with their PCP regarding potential risk factor modification, dietary therapy or pharmacologic therapy if clinically indicated. Pt.  is  currently on statin therapy. Please place order for annual  screening scan for  05/2020 and fax results to PCP. Thanks so much. 

## 2019-05-19 ENCOUNTER — Other Ambulatory Visit: Payer: Self-pay | Admitting: *Deleted

## 2019-05-19 DIAGNOSIS — F17210 Nicotine dependence, cigarettes, uncomplicated: Secondary | ICD-10-CM

## 2019-05-19 DIAGNOSIS — Z87891 Personal history of nicotine dependence: Secondary | ICD-10-CM

## 2019-05-30 ENCOUNTER — Other Ambulatory Visit: Payer: Self-pay | Admitting: Cardiology

## 2019-06-01 ENCOUNTER — Other Ambulatory Visit: Payer: Self-pay

## 2019-06-01 ENCOUNTER — Encounter: Payer: Self-pay | Admitting: Acute Care

## 2019-06-01 ENCOUNTER — Ambulatory Visit: Payer: Medicare PPO | Admitting: Acute Care

## 2019-06-01 DIAGNOSIS — R059 Cough, unspecified: Secondary | ICD-10-CM

## 2019-06-01 DIAGNOSIS — F17210 Nicotine dependence, cigarettes, uncomplicated: Secondary | ICD-10-CM | POA: Diagnosis not present

## 2019-06-01 DIAGNOSIS — F17200 Nicotine dependence, unspecified, uncomplicated: Secondary | ICD-10-CM

## 2019-06-01 DIAGNOSIS — R05 Cough: Secondary | ICD-10-CM

## 2019-06-01 DIAGNOSIS — Z0000 Encounter for general adult medical examination without abnormal findings: Secondary | ICD-10-CM | POA: Insufficient documentation

## 2019-06-01 DIAGNOSIS — J441 Chronic obstructive pulmonary disease with (acute) exacerbation: Secondary | ICD-10-CM | POA: Diagnosis not present

## 2019-06-01 DIAGNOSIS — J9611 Chronic respiratory failure with hypoxia: Secondary | ICD-10-CM | POA: Diagnosis not present

## 2019-06-01 DIAGNOSIS — R911 Solitary pulmonary nodule: Secondary | ICD-10-CM | POA: Diagnosis not present

## 2019-06-01 MED ORDER — TRELEGY ELLIPTA 100-62.5-25 MCG/INH IN AEPB: 1.0000 | INHALATION_SPRAY | Freq: Every day | RESPIRATORY_TRACT | 0 refills | Status: DC

## 2019-06-01 MED ORDER — TRELEGY ELLIPTA 100-62.5-25 MCG/INH IN AEPB: 1.0000 | INHALATION_SPRAY | Freq: Every day | RESPIRATORY_TRACT | 2 refills | Status: DC

## 2019-06-01 NOTE — Progress Notes (Signed)
History of Present Illness Mercedes Thornton is a 74 y.o. female current every day smoker with COPD, emphysema, and chronic respiratory failure with hypoxia. She is followed by Dr. Lake Thornton. She needs to be reassigned to another physician. Currently uses nocturnal oxygen. She was  followed through the [facility] program. She did not have follow up scanning in 2019 or 2020. Maintenance medications Trelegy Albuterol as rescue  Currently smoking 5-6  cigarettes daily  Pt. Has had both COVID vaccines 05/2019   06/01/2019  Pt. Presents for a 1 month follow up. She was seen 04/27/2019 with an acute COPD exacerbation. We transitioned her to Trelegy from [place] at that time, and ordered PFT's. She is here for follow up after therapeutic trial with Trelegy to see if this was a good medication conversion. PFT's are still pending, but the order has been written.   Pt. States she has  Been doing well. She states she really likes the change to Trelegy. She feels she is doing better since making the change. She feels she has less shortness of breath with the Trelegy. She would like to stay on this medication.She has also started using the Allegra for some of the post nasal gtt she was experiencing. She states this has also helped. She does not have sinus issues any more.  She states she is down to 1/4 PPD cigarettes. She is continuing to work on smoking less each day.  She has minimal secretions, no cough, no fever and she feels she has returned to her baseline .   Test Results:  05/17/2019 LDCT Chest 1. Lung-RADS Category 2, benign appearance or behavior. Continue annual screening with low-dose CT in 12 months. 2. Emphysema. 3. Coronary artery and thoracic aortic atherosclerosis. 4. Aortic Atherosclerosis (ICD10-I70.0) and Emphysema (ICD10-J43.9).  CBC Latest Ref Rng & Units 08/23/2015 06/13/2015 02/16/2015  WBC 3.9 - 10.3 10e3/uL 14.5(H) 13.4(H) 10.3  Hemoglobin 11.6 - 15.9 g/dL 13.8 13.7 11.0(L)   Hematocrit 34.8 - 46.6 % 42.0 40.5 35.0(L)  Platelets 145 - 400 10e3/uL 262 357 576(H)    BMP Latest Ref Rng & Units 08/23/2015 06/13/2015 04/25/2015  Glucose 70 - 140 mg/dl 175(H) 219(H) 196(H)  BUN 7.0 - 26.0 mg/dL 9.8 13 18   Creatinine 0.6 - 1.1 mg/dL 0.8 0.75 0.74  Sodium 136 - 145 mEq/L 143 140 136  Potassium 3.5 - 5.1 mEq/L 3.7 4.2 3.9  Chloride 98 - 110 mmol/L - 104 103  CO2 22 - 29 mEq/L 27 24 21   Calcium 8.4 - 10.4 mg/dL 9.7 9.7 9.8    BNP    Component Value Date/Time   BNP 487.5 (H) 02/01/2015 1530    ProBNP No results found for: PROBNP  PFT    Component Value Date/Time   FEV1PRE 1.39 04/17/2015 1508   FVCPRE 1.89 04/17/2015 1508   PREFEV1FVCRT 73 04/17/2015 1508    CT CHEST LUNG CA SCREEN LOW DOSE W/O CM  Result Date: 05/18/2019 CLINICAL DATA:  74 year old female with 53 pack-year history of smoking. Lung cancer screening. EXAM: CT CHEST WITHOUT CONTRAST LOW-DOSE FOR LUNG CANCER SCREENING TECHNIQUE: Multidetector CT imaging of the chest was performed following the standard protocol without IV contrast. COMPARISON:  Lung cancer screening CT 08/28/2016. Standard CT chest 08/23/2015 FINDINGS: Cardiovascular: Heart size upper normal. Stable appearance tiny pericardial effusion. Coronary artery calcification is evident. Atherosclerotic calcification is noted in the wall of the thoracic aorta. Mediastinum/Nodes: Scattered upper normal mediastinal lymph nodes, including a mildly enlarged 12 mm short axis AP  window node are stable in the nearly 4 year interval since 08/23/2015 , consistent with benign etiology.No evidence for gross hilar lymphadenopathy although assessment is limited by the lack of intravenous contrast on today's study. The esophagus has normal imaging features. There is no axillary lymphadenopathy. Lungs/Pleura: Centrilobular emphsyema noted. Tiny right lung nodules identified on the previous exam have resolved in the interval. New tiny 2.5 mm lingular nodule  identified on image 151. Stable chronic atelectasis or scarring in the lingula. No focal airspace consolidation. No pleural effusion. Upper Abdomen: Unremarkable. Musculoskeletal: No worrisome lytic or sclerotic osseous abnormality. IMPRESSION: 1. Lung-RADS Category 2, benign appearance or behavior. Continue annual screening with low-dose CT in 12 months. 2. Emphysema. 3. Coronary artery and thoracic aortic atherosclerosis. 4. Aortic Atherosclerosis (ICD10-I70.0) and Emphysema (ICD10-J43.9). Electronically Signed   By: Misty Stanley M.D.   On: 05/18/2019 08:30     Past medical hx Past Medical History:  Diagnosis Date  . Allergic rhinitis   . Bipolar affective disorder ([place])   . COPD (chronic obstructive pulmonary disease) ([place])   . Coronary artery disease 02/2015   50% RCA otherwise normal  . Cough 10/30/2011  . DM (diabetes mellitus) ([place])   . Hyperlipidemia   . Leukocytosis   . Mediastinal adenopathy 11/26/2011  . Pericardial effusion   . Postoperative atrial fibrillation ([place])   . Smoking   . SOB (shortness of breath) 10/30/2011     Social History   Tobacco Use  . Smoking status: Current Every Day Smoker    Packs/day: 1.00    Years: 51.00    Pack years: 51.00    Types: Cigarettes  . Smokeless tobacco: Never Used  . Tobacco comment: pt down to 0.25ppd; as of 06/15/2018 smokes 5-10 cigarettes/day  Substance Use Topics  . Alcohol use: No    Alcohol/week: 0.0 standard drinks  . Drug use: No    [name] reports that she has been smoking cigarettes. She has a 51.00 pack-year smoking history. She has never used smokeless tobacco. She reports that she does not drink alcohol or use drugs.  Tobacco Cessation: Ready to quit: Not Answered Counseling given: Not Answered Comment: pt down to 0.25ppd; as of 06/15/2018 smokes 5-10 cigarettes/day   Past surgical hx, Family hx, Social hx all reviewed.  Current Outpatient Medications on File Prior to Visit  Medication Sig  . albuterol  (VENTOLIN HFA) 108 (90 Base) MCG/ACT inhaler Inhale 2 puffs into the lungs every 6 (six) hours as needed for wheezing.  Marland Kitchen aspirin 81 MG tablet Take 81 mg by mouth daily.  Marland Kitchen atorvastatin (LIPITOR) 20 MG tablet Take 1 tablet (20 mg total) by mouth daily.  . Cholecalciferol (VITAMIN D3) 3000 UNITS TABS Take 1 tablet by mouth daily.   . Cinnamon 500 MG TABS Take 1 tablet by mouth daily.   . clindamycin (CLEOCIN) 150 MG capsule Take 150 mg by mouth 2 (two) times daily.  . clonazePAM (KLONOPIN) 1 MG tablet Take 1 tablet by mouth daily.  . fluticasone (FLONASE) 50 MCG/ACT nasal spray Place 1 spray into both nostrils daily as needed for allergies.   . Fluticasone-Umeclidin-Vilant (TRELEGY ELLIPTA) 100-62.5-25 MCG/INH AEPB Inhale 1 puff into the lungs daily.  Marland Kitchen glimepiride (AMARYL) 4 MG tablet Take 1 tablet by mouth daily.  Marland Kitchen lovastatin (MEVACOR) 10 MG tablet Take 10 mg at bedtime by mouth.  . metFORMIN (GLUCOPHAGE) 500 MG tablet Take 1,000 mg by mouth 2 (two) times daily.  . metFORMIN (GLUCOPHAGE-XR) 500 MG 24 hr tablet  Take 500 mg by mouth 2 (two) times daily.  . metoprolol tartrate (LOPRESSOR) 25 MG tablet Take 1 tablet by mouth twice daily  . Multiple Vitamins-Minerals (WOMENS MULTIVITAMIN PLUS PO) Take 1 tablet by mouth daily.   . nicotine (NICOTROL) 10 MG inhaler Inhale 1 Cartridge (1 continuous puffing total) as needed into the lungs for smoking cessation.  . TURMERIC PO Take 1 tablet by mouth daily.  . vitamin C (ASCORBIC ACID) 500 MG tablet Take 500 mg by mouth daily.   No current facility-administered medications on file prior to visit.     Allergies  Allergen Reactions  . Penicillins Hives and Swelling    Has patient had a PCN reaction causing immediate rash, facial/tongue/throat swelling, SOB or lightheadedness with hypotension: Yes Has patient had a PCN reaction causing severe rash involving mucus membranes or skin necrosis: Yes Has patient had a PCN reaction that required  hospitalization Yes- hospital for 7 days Has patient had a PCN reaction occurring within the last 10 years: No If all of the above answers are "NO", then may proceed with Cephalosporin use.     Review Of Systems:  Constitutional:   No  weight loss, night sweats,  Fevers, chills, fatigue, or  lassitude.  HEENT:   No headaches,  Difficulty swallowing,  Tooth/dental problems, or  Sore throat,                No sneezing, itching, ear ache, nasal congestion, post nasal drip,   CV:  No chest pain,  Orthopnea, PND, swelling in lower extremities, anasarca, dizziness, palpitations, syncope.   GI  No heartburn, indigestion, abdominal pain, nausea, vomiting, diarrhea, change in bowel habits, loss of appetite, bloody stools.   Resp: + shortness of breath with exertion or at rest.  No excess mucus, no productive cough,  No non-productive cough,  No coughing up of blood.  No change in color of mucus.  No wheezing.  No chest wall deformity  Skin: no rash or lesions.  GU: no dysuria, change in color of urine, no urgency or frequency.  No flank pain, no hematuria   MS:  No joint pain or swelling.  No decreased range of motion.  No back pain.  Psych:  No change in mood or affect. No depression or anxiety.  No memory loss.   Vital Signs BP 112/64 (BP Location: Left Arm, Cuff Size: Normal)   Pulse 85   Temp (!) 97.1 F (36.2 C) (Oral)   Ht 5\' 4"  (1.626 m)   Wt 174 lb 3.2 oz (79 kg)   SpO2 95%   BMI 29.90 kg/m    Physical Exam:  General- No distress,  A&Ox3, pleasant ENT: No sinus tenderness, TM clear, pale nasal mucosa, no oral exudate,no post nasal drip, no LAN Cardiac: S1, S2, regular rate and rhythm, no murmur Chest: No wheeze/ rales/ dullness; no accessory muscle use, no nasal flaring, no sternal retractions, diminished per bases Abd.: Soft Non-tender, ND,BS +, Body mass index is 29.9 kg/m. Ext: No clubbing cyanosis, edema Neuro:  normal strength, MAE x 4, A&O x 3 Skin: No rashes,  No lesions , warm and dry Psych: normal mood and behavior   Assessment/Plan  Chronic respiratory failure (HCC) Continue wearing oxygen 2 L at bedtime every night. Wear oxygen during the day if you have drops in your oxygen saturations below 88% with exertion.     COPD with acute exacerbation (HCC) Resolved Continue Trelegy 1 puff once daily Rinse mouth and brush  teeth after use Continue albuteol for rescue as needed for shortness of breath or wheezing  Lung nodule Screening scan 05/2019 Lung RADS 2: nodules that are benign in appearance and behavior with a very low likelihood of becoming a clinically active cancer due to size or lack of growth. Recommendation per radiology is for a repeat LDCT in 12 months. We will schedule annual screening scan for 05/2020  Smoking Counseled on smoking cessation Please continue to work on smoking cessation. This is the single most powerful action you can take to decrease your risk of lung cancer, heart disease, progressive lung disease and stroke.   Cough Resolved with use of [place] maintenance Pt. Has had both COVID vaccines. She had no ADR's  This appointment was over 35 minutes long with over 50% of the time being direct face to face patient care, assessment , plan of care , and follow up,   Magdalen Spatz, NP 06/01/2019  2:11 PM

## 2019-06-01 NOTE — Patient Instructions (Addendum)
It is good to see you today. We will send in a prescription for Trelegy. Ise 1 puff once daily  Every day. Rinse mouth after use. We will see if we have any sample today. Continue using your rescue inhaler as needed for breakthrough shortness of breath of wheezing . You can use this up to 3 times daily as needed. Continue Allegra daily. Conitnue to wear your oxygen at bedtime as you have been doing. Remember you can wear this during the day also.  We will call you when we can get PFT's scheduled.  We will do a follow up appointment in 6 months  with Dr. Carlis Abbott to get you established with her . I'm so glad you have had your COVID vaccines.  Please continue to work on smoking cessation. This is the single most powerful action you can take to decrease your risk of lung cancer, heart disease, progressive lung disease and stroke.  We will schedule your annual lung cancer screening scan for 05/2020 Please contact office for sooner follow up if symptoms do not improve or worsen or seek emergency care

## 2019-06-01 NOTE — Assessment & Plan Note (Signed)
Pt. Has had both COVID vaccines. She had no ADR's

## 2019-06-01 NOTE — Assessment & Plan Note (Signed)
Resolved with use of Allegra

## 2019-06-01 NOTE — Assessment & Plan Note (Signed)
Counseled on smoking cessation Please continue to work on smoking cessation. This is the single most powerful action you can take to decrease your risk of lung cancer, heart disease, progressive lung disease and stroke.

## 2019-06-01 NOTE — Assessment & Plan Note (Signed)
Continue wearing oxygen 2 L at bedtime every night. Wear oxygen during the day if you have drops in your oxygen saturations below 88% with exertion.

## 2019-06-01 NOTE — Assessment & Plan Note (Signed)
Screening scan 05/2019 Lung RADS 2: nodules that are benign in appearance and behavior with a very low likelihood of becoming a clinically active cancer due to size or lack of growth. Recommendation per radiology is for a repeat LDCT in 12 months. We will schedule annual screening scan for 05/2020

## 2019-06-01 NOTE — Assessment & Plan Note (Signed)
Resolved Continue Trelegy 1 puff once daily Rinse mouth and brush teeth after use Continue albuteol for rescue as needed for shortness of breath or wheezing

## 2019-06-06 ENCOUNTER — Other Ambulatory Visit: Payer: Medicare PPO

## 2019-06-07 LAB — LIPID PANEL
Chol/HDL Ratio: 3 ratio (ref 0.0–4.4)
Cholesterol, Total: 136 mg/dL (ref 100–199)
HDL: 46 mg/dL (ref >39)
LDL Chol Calc (NIH): 67 mg/dL (ref 0–99)
Triglycerides: 132 mg/dL (ref 0–149)
VLDL Cholesterol Cal: 23 mg/dL (ref 5–40)

## 2019-06-07 LAB — ALT: ALT: 11 IU/L (ref 0–32)

## 2019-06-10 ENCOUNTER — Telehealth: Payer: Self-pay | Admitting: Cardiology

## 2019-06-10 NOTE — Telephone Encounter (Signed)
The patient has been notified of the result and verbalized understanding.  All questions (if any) were answered. Antonieta Iba, RN 06/10/2019 3:40 PM

## 2019-06-10 NOTE — Telephone Encounter (Signed)
Mercedes Thornton is calling requesting a nurse call her to discuss her labs performed on 06/06/19.

## 2019-07-04 ENCOUNTER — Other Ambulatory Visit: Payer: Self-pay | Admitting: Internal Medicine

## 2019-07-04 DIAGNOSIS — Z1231 Encounter for screening mammogram for malignant neoplasm of breast: Secondary | ICD-10-CM

## 2019-07-22 ENCOUNTER — Ambulatory Visit
Admission: RE | Admit: 2019-07-22 | Discharge: 2019-07-22 | Disposition: A | Discharge status: Home / Self Care | Payer: Medicare PPO | Source: Ambulatory Visit | Attending: Internal Medicine | Admitting: Internal Medicine

## 2019-07-22 ENCOUNTER — Other Ambulatory Visit: Payer: Self-pay

## 2019-07-22 DIAGNOSIS — Z1231 Encounter for screening mammogram for malignant neoplasm of breast: Secondary | ICD-10-CM

## 2019-07-25 ENCOUNTER — Other Ambulatory Visit: Payer: Self-pay | Admitting: Internal Medicine

## 2019-07-25 DIAGNOSIS — R928 Other abnormal and inconclusive findings on diagnostic imaging of breast: Secondary | ICD-10-CM

## 2019-08-01 ENCOUNTER — Other Ambulatory Visit (HOSPITAL_COMMUNITY)
Admission: RE | Admit: 2019-08-01 | Discharge: 2019-08-01 | Disposition: A | Discharge status: Home / Self Care | Payer: Medicare PPO | Source: Ambulatory Visit | Attending: Critical Care Medicine | Admitting: Critical Care Medicine

## 2019-08-01 DIAGNOSIS — Z20822 Contact with and (suspected) exposure to covid-19: Secondary | ICD-10-CM | POA: Insufficient documentation

## 2019-08-01 DIAGNOSIS — Z01812 Encounter for preprocedural laboratory examination: Secondary | ICD-10-CM | POA: Insufficient documentation

## 2019-08-01 LAB — SARS CORONAVIRUS 2 (TAT 6-24 HRS): SARS Coronavirus 2: NEGATIVE

## 2019-08-02 ENCOUNTER — Telehealth: Payer: Self-pay | Admitting: Acute Care

## 2019-08-02 NOTE — Telephone Encounter (Signed)
Returned call and explained process to patient. Nothing further needed.

## 2019-08-02 NOTE — Telephone Encounter (Signed)
Pt returning calling bk. [phone].  Free anytime today

## 2019-08-02 NOTE — Telephone Encounter (Signed)
ATC pt, no answer. Left message for pt to call back.  

## 2019-08-04 ENCOUNTER — Other Ambulatory Visit: Payer: Self-pay

## 2019-08-04 ENCOUNTER — Ambulatory Visit: Payer: Medicare PPO

## 2019-08-04 DIAGNOSIS — J432 Centrilobular emphysema: Secondary | ICD-10-CM

## 2019-08-04 LAB — PULMONARY FUNCTION TEST
FEF 25-75 Post: 0.71 L/sec
FEF 25-75 Pre: 0.68 L/sec
FEF2575-%Change-Post: 5 %
FEF2575-%Pred-Post: 43 %
FEF2575-%Pred-Pre: 41 %
FEV1-%Change-Post: 1 %
FEV1-%Pred-Post: 63 %
FEV1-%Pred-Pre: 62 %
FEV1-Post: 1.18 L
FEV1-Pre: 1.16 L
FEV1FVC-%Change-Post: 0 %
FEV1FVC-%Pred-Pre: 85 %
FEV6-%Change-Post: 0 %
FEV6-%Pred-Post: 77 %
FEV6-%Pred-Pre: 77 %
FEV6-Post: 1.77 L
FEV6-Pre: 1.77 L
FEV6FVC-%Change-Post: -1 %
FEV6FVC-%Pred-Post: 102 %
FEV6FVC-%Pred-Pre: 104 %
FVC-%Change-Post: 1 %
FVC-%Pred-Post: 75 %
FVC-%Pred-Pre: 74 %
FVC-Post: 1.79 L
FVC-Pre: 1.77 L
Post FEV1/FVC ratio: 66 %
Post FEV6/FVC ratio: 99 %
Pre FEV1/FVC ratio: 66 %
Pre FEV6/FVC Ratio: 100 %

## 2019-08-12 ENCOUNTER — Ambulatory Visit
Admission: RE | Admit: 2019-08-12 | Discharge: 2019-08-12 | Disposition: A | Discharge status: Home / Self Care | Payer: Medicare PPO | Source: Ambulatory Visit | Attending: Internal Medicine | Admitting: Internal Medicine

## 2019-08-12 ENCOUNTER — Other Ambulatory Visit: Payer: Self-pay | Admitting: Internal Medicine

## 2019-08-12 ENCOUNTER — Other Ambulatory Visit: Payer: Self-pay

## 2019-08-12 DIAGNOSIS — R928 Other abnormal and inconclusive findings on diagnostic imaging of breast: Secondary | ICD-10-CM

## 2019-09-23 ENCOUNTER — Other Ambulatory Visit: Payer: Self-pay | Admitting: *Deleted

## 2019-09-23 MED ORDER — TRELEGY ELLIPTA 100-62.5-25 MCG/INH IN AEPB: 1.0000 | INHALATION_SPRAY | Freq: Every day | RESPIRATORY_TRACT | 2 refills | Status: DC

## 2019-09-26 ENCOUNTER — Telehealth: Payer: Self-pay | Admitting: Acute Care

## 2019-09-26 MED ORDER — TRELEGY ELLIPTA 100-62.5-25 MCG/INH IN AEPB: 1.0000 | INHALATION_SPRAY | Freq: Every day | RESPIRATORY_TRACT | 3 refills | Status: DC

## 2019-09-26 NOTE — Telephone Encounter (Signed)
Left message for patient to call back. I have sent the refills into the pharmacy for her.

## 2019-09-27 ENCOUNTER — Other Ambulatory Visit: Payer: Self-pay

## 2019-09-27 MED ORDER — TRELEGY ELLIPTA 100-62.5-25 MCG/INH IN AEPB: 1.0000 | INHALATION_SPRAY | Freq: Every day | RESPIRATORY_TRACT | 2 refills | Status: DC

## 2019-09-28 NOTE — Telephone Encounter (Signed)
Left VM letting patient know that her prescription for Trelegy had been sent to [facility] pharmacy and to call us back for any questions.

## 2020-02-15 ENCOUNTER — Other Ambulatory Visit: Payer: Self-pay | Admitting: Cardiology

## 2020-02-16 ENCOUNTER — Other Ambulatory Visit: Payer: Medicare PPO

## 2020-02-29 ENCOUNTER — Other Ambulatory Visit: Payer: Self-pay

## 2020-02-29 ENCOUNTER — Ambulatory Visit: Payer: Medicare PPO

## 2020-02-29 ENCOUNTER — Ambulatory Visit
Admission: RE | Admit: 2020-02-29 | Discharge: 2020-02-29 | Disposition: A | Discharge status: Home / Self Care | Payer: Medicare PPO | Source: Ambulatory Visit | Attending: Internal Medicine | Admitting: Internal Medicine

## 2020-02-29 DIAGNOSIS — R928 Other abnormal and inconclusive findings on diagnostic imaging of breast: Secondary | ICD-10-CM

## 2020-03-12 ENCOUNTER — Other Ambulatory Visit: Payer: Self-pay | Admitting: Internal Medicine

## 2020-03-19 ENCOUNTER — Other Ambulatory Visit: Payer: Self-pay | Admitting: Cardiology

## 2020-03-19 DIAGNOSIS — I4891 Unspecified atrial fibrillation: Secondary | ICD-10-CM

## 2020-03-19 DIAGNOSIS — I9789 Other postprocedural complications and disorders of the circulatory system, not elsewhere classified: Secondary | ICD-10-CM

## 2020-04-11 ENCOUNTER — Other Ambulatory Visit: Payer: Self-pay | Admitting: Cardiology

## 2020-04-20 ENCOUNTER — Other Ambulatory Visit: Payer: Self-pay | Admitting: Cardiology

## 2020-04-20 ENCOUNTER — Other Ambulatory Visit: Payer: Self-pay

## 2020-04-20 DIAGNOSIS — I4891 Unspecified atrial fibrillation: Secondary | ICD-10-CM

## 2020-04-20 DIAGNOSIS — I9789 Other postprocedural complications and disorders of the circulatory system, not elsewhere classified: Secondary | ICD-10-CM

## 2020-04-23 NOTE — Telephone Encounter (Signed)
OK to refill for 1 month and needs followup with me for any more refills after that

## 2020-05-03 ENCOUNTER — Other Ambulatory Visit: Payer: Self-pay

## 2020-05-03 ENCOUNTER — Encounter: Payer: Self-pay | Admitting: Cardiology

## 2020-05-03 ENCOUNTER — Ambulatory Visit: Payer: Medicare PPO | Admitting: Cardiology

## 2020-05-03 VITALS — BP 110/60 | HR 96 | Ht 64.0 in | Wt 160.6 lb

## 2020-05-03 DIAGNOSIS — I9789 Other postprocedural complications and disorders of the circulatory system, not elsewhere classified: Secondary | ICD-10-CM | POA: Diagnosis not present

## 2020-05-03 DIAGNOSIS — I3139 Other pericardial effusion (noninflammatory): Secondary | ICD-10-CM

## 2020-05-03 DIAGNOSIS — I2510 Atherosclerotic heart disease of native coronary artery without angina pectoris: Secondary | ICD-10-CM

## 2020-05-03 DIAGNOSIS — I313 Pericardial effusion (noninflammatory): Secondary | ICD-10-CM

## 2020-05-03 DIAGNOSIS — E7800 Pure hypercholesterolemia, unspecified: Secondary | ICD-10-CM

## 2020-05-03 DIAGNOSIS — I4891 Unspecified atrial fibrillation: Secondary | ICD-10-CM

## 2020-05-03 DIAGNOSIS — I472 Ventricular tachycardia, unspecified: Secondary | ICD-10-CM

## 2020-05-03 NOTE — Progress Notes (Signed)
Cardiology Office Note:    Date:  05/03/2020   ID:  Mercedes Thornton 23-Jan-1946, MRN [id number]  PCP:  Antonietta Jewel, MD  Cardiologist:  Fransico Him, MD    Referring MD: Antonietta Jewel, MD   Chief Complaint  Patient presents with  . Coronary Artery Disease    History of Present Illness:    Mercedes Thornton is a 75 y.o. female with a hx of  ongoing tobacco use, COPD, DM, dyslipidemia, pericardialeffusion, nonobstructive ASCAD with 50% RCA by cath 11/2016and bipolar disorder. She has a history ofpost op afib, diastolic CHF and NSVTafter appendectomy. She has chronic DOE from her COPD and is on O2 at night.  She recently had bronchitis and her breathing has not gotten back to baseline yet.  She is here today for followup and is doing well.  She denies any chest pain or pressure,  PND, orthopnea, LE edema, dizziness, palpitations or syncope. He is compliant with his meds and is tolerating meds with no SE.    Past Medical History:  Diagnosis Date  . Allergic rhinitis   . Bipolar affective disorder ([place])   . COPD (chronic obstructive pulmonary disease) ([place])   . Coronary artery disease 02/2015   50% RCA otherwise normal  . Cough 10/30/2011  . DM (diabetes mellitus) ([place])   . Hyperlipidemia   . Leukocytosis   . Mediastinal adenopathy 11/26/2011  . Pericardial effusion   . Postoperative atrial fibrillation ([place])   . Smoking   . SOB (shortness of breath) 10/30/2011    Past Surgical History:  Procedure Laterality Date  . CARDIAC CATHETERIZATION N/A 02/15/2015   Procedure: Left Heart Cath and Coronary Angiography;  Surgeon: Leonie Man, MD;  Location: [facility];  Service: Cardiovascular;  Laterality: N/A;  . CATARACT EXTRACTION    . CESAREAN SECTION    . COLONOSCOPY  2008   negative per pt's report, with [facility]   . LAPAROSCOPIC APPENDECTOMY N/A 01/31/2015   Procedure: LYSIS OF ADHESIONS [place] [facility] LAPAROSCOPIC ;  Surgeon: Alphonsa Overall, MD;  Location:  [facility];  Service: General;  Laterality: N/A;    Current Medications: Current Meds  Medication Sig  . albuterol (VENTOLIN HFA) 108 (90 Base) MCG/ACT inhaler Inhale 2 puffs into the lungs every 6 (six) hours as needed for wheezing.  Marland Kitchen aspirin 81 MG tablet Take 81 mg by mouth daily.  Marland Kitchen atorvastatin (LIPITOR) 20 MG tablet Take 1 tablet (20 mg total) by mouth daily. PLEASE KEEP UPCOMING APPT FOR FURTHER REFILLS  . Cholecalciferol (VITAMIN D3) 3000 UNITS TABS Take 1 tablet by mouth daily.   . Cinnamon 500 MG TABS Take 1 tablet by mouth daily.   . clonazePAM (KLONOPIN) 1 MG tablet Take 1 tablet by mouth daily.  . fluticasone (FLONASE) 50 MCG/ACT nasal spray Place 1 spray into both nostrils daily as needed for allergies.   . Fluticasone-Umeclidin-Vilant (TRELEGY ELLIPTA) 100-62.5-25 MCG/INH AEPB Inhale 1 puff into the lungs daily.  Marland Kitchen glimepiride (AMARYL) 4 MG tablet Take 1 tablet by mouth daily.  . metFORMIN (GLUCOPHAGE-XR) 500 MG 24 hr tablet Take 500 mg by mouth 2 (two) times daily.  . metoprolol tartrate (LOPRESSOR) 25 MG tablet TAKE 1 TABLET BY MOUTH TWICE DAILY. Please keep upcoming appt in January 2022 with Dr. Radford Pax before anymore refills. Thank you  . Multiple Vitamins-Minerals (WOMENS MULTIVITAMIN PLUS PO) Take 1 tablet by mouth daily.   . nicotine (NICOTROL) 10 MG inhaler Inhale 1 Cartridge (1 continuous puffing total) as  needed into the lungs for smoking cessation.  . vitamin C (ASCORBIC ACID) 500 MG tablet Take 500 mg by mouth daily.     Allergies:   Penicillins   Social History   Socioeconomic History  . Marital status: Divorced    Spouse name: Not on file  . Number of children: 2  . Years of education: Not on file  . Highest education level: Not on file  Occupational History    Comment: retired grade school/middle school teacher  Tobacco Use  . Smoking status: Current Every Day Smoker    Packs/day: 1.00    Years: 51.00    Pack years: 51.00    Types: Cigarettes  .  Smokeless tobacco: Never Used  . Tobacco comment: pt down to 0.25ppd; as of 06/15/2018 smokes 5-10 cigarettes/day  Vaping Use  . Vaping Use: Never used  Substance and Sexual Activity  . Alcohol use: No    Alcohol/week: 0.0 standard drinks  . Drug use: No  . Sexual activity: Not on file  Other Topics Concern  . Not on file  Social History Narrative  . Not on file   Social Determinants of Health   Financial Resource Strain: Not on file  Food Insecurity: Not on file  Transportation Needs: Not on file  Physical Activity: Not on file  Stress: Not on file  Social Connections: Not on file     Family History: The patient's family history includes Cancer in her father; Stroke in her mother. There is no history of Breast cancer.  ROS:   Please see the history of present illness.    ROS  All other systems reviewed and negative.   EKGs/Labs/Other Studies Reviewed:    The following studies were reviewed today:  2D echo 04/21/2019 IMPRESSIONS   1. Left ventricular ejection fraction, by visual estimation, is 60 to  65%. The left ventricle has normal function. There is mildly increased  left ventricular hypertrophy.  2. The average left ventricular global longitudinal strain is -21.3 %.  3. Global right ventricle has mildly reduced systolic function.The right  ventricular size is normal. No increase in right ventricular wall  thickness.  4. Left atrial size was normal.  5. Right atrial size was normal.  6. Trivial pericardial effusion is present. Measures <51mm adjacent to LV  lateral wall at end diastole  7. The mitral valve is normal in structure. No evidence of mitral valve  regurgitation. No evidence of mitral stenosis.  8. The tricuspid valve is normal in structure.  9. The aortic valve is tricuspid. Aortic valve regurgitation is not  visualized. Mild to moderate aortic valve sclerosis/calcification without  any evidence of aortic stenosis.  10. The pulmonic valve was  not well visualized. Pulmonic valve  regurgitation is not visualized.  11. The inferior vena cava is normal in size with greater than 50%  respiratory variability, suggesting right atrial pressure of 3 mmHg.  12. TR signal is inadequate for assessing pulmonary artery systolic  pressure.   EKG:  EKG is  ordered today.  The ekg ordered today demonstrates NSR at 96bpm with no ST changes  Recent Labs: 06/06/2019: ALT 11   Recent Lipid Panel    Component Value Date/Time   CHOL 136 06/06/2019 0927   TRIG 132 06/06/2019 0927   HDL 46 06/06/2019 0927   CHOLHDL 3.0 06/06/2019 0927   CHOLHDL 2.0 06/13/2015 1056   VLDL 18 06/13/2015 1056   LDLCALC 67 06/06/2019 0927    Physical Exam:  VS:  BP 110/60   Pulse 96   Ht 5\' 4"  (1.626 m)   Wt 160 lb 9.6 oz (72.8 kg)   BMI 27.57 kg/m     Wt Readings from Last 3 Encounters:  05/03/20 160 lb 9.6 oz (72.8 kg)  06/01/19 174 lb 3.2 oz (79 kg)  04/27/19 169 lb (76.7 kg)     GEN: Well nourished, well developed in no acute distress HEENT: Normal NECK: No JVD; No carotid bruits LYMPHATICS: No lymphadenopathy CARDIAC:RRR, no murmurs, rubs, gallops RESPIRATORY:  Clear to auscultation without rales, wheezing or rhonchi  ABDOMEN: Soft, non-tender, non-distended MUSCULOSKELETAL:  No edema; No deformity  SKIN: Warm and dry NEUROLOGIC:  Alert and oriented x 3 PSYCHIATRIC:  Normal affect   ASSESSMENT:    1. Coronary artery disease involving native coronary artery of native heart without angina pectoris   2. Ventricular tachyarrhythmia ([place])   3. Postoperative atrial fibrillation (HCC)   4. Pericardial effusion   5. Pure hypercholesterolemia    PLAN:    In order of problems listed above:  1.  ASCAD -nonobstructive ASCAD with 50% RCA by cath 02/2015 -she has not had any anginal symptoms -continue ASA 81mg  daily, BB and statin  2.  Ventricular tachycardia -she denies any palpitations -continue Lopressor 25mg  BID  3.  Postop  afib -occurred after appendectomy with no further episodes -not on anticoagulation since occurred in the post op setting with no reoccurrence -he has not had any palpitations since I saw him last -continue BB  4.  Pericardial effusion -mild posterior effusion by echo 2018 and was supposed to have repeat echo but this was never done -trivial posterior effusion persists but asymptomatic  5.  HLD -LDL goal < 70 -repeat FLP and ALT -continue atorvastatin 20mg  daily   Medication Adjustments/Labs and Tests Ordered: Current medicines are reviewed at length with the patient today.  Concerns regarding medicines are outlined above.  Orders Placed This Encounter  Procedures  . EKG 12-Lead   No orders of the defined types were placed in this encounter.   Signed, Fransico Him, MD  05/03/2020 3:39 PM    [place]

## 2020-05-03 NOTE — Addendum Note (Signed)
Addended by: Antonieta Iba on: 05/03/2020 03:54 PM   Modules accepted: Orders

## 2020-05-03 NOTE — Patient Instructions (Addendum)
Medication Instructions:  Your physician recommends that you continue on your current medications as directed. Please refer to the Current Medication list given to you today.  *If you need a refill on your cardiac medications before your next appointment, please call your pharmacy*  Lab Work: TODAY: Fasting lipids and ALT If you have labs (blood work) drawn today and your tests are completely normal, you will receive your results only by: MyChart Message (if you have MyChart) OR A paper copy in the mail If you have any lab test that is abnormal or we need to change your treatment, we will call you to review the results.  Follow-Up: At [facility], you and your health needs are our priority.  As part of our continuing mission to provide you with exceptional heart care, we have created designated Provider Care Teams.  These Care Teams include your primary Cardiologist (physician) and Advanced Practice Providers (APPs -  Physician Assistants and Nurse Practitioners) who all work together to provide you with the care you need, when you need it.  Your next appointment:   1 year(s)  The format for your next appointment:   In Person  Provider:   You may see Traci Turner, MD or one of the following Advanced Practice Providers on your designated Care Team:   Dayna Dunn, PA-C Michele Lenze, PA-C  

## 2020-05-04 ENCOUNTER — Telehealth: Payer: Self-pay

## 2020-05-04 DIAGNOSIS — E7800 Pure hypercholesterolemia, unspecified: Secondary | ICD-10-CM

## 2020-05-04 LAB — LIPID PANEL
Chol/HDL Ratio: 3.7 ratio (ref 0.0–4.4)
Cholesterol, Total: 145 mg/dL (ref 100–199)
HDL: 39 mg/dL — ABNORMAL LOW (ref >39)
LDL Chol Calc (NIH): 78 mg/dL (ref 0–99)
Triglycerides: 161 mg/dL — ABNORMAL HIGH (ref 0–149)
VLDL Cholesterol Cal: 28 mg/dL (ref 5–40)

## 2020-05-04 LAB — ALT: ALT: 13 IU/L (ref 0–32)

## 2020-05-04 MED ORDER — ATORVASTATIN CALCIUM 40 MG PO TABS: 40.0000 mg | ORAL_TABLET | Freq: Every day | ORAL | 3 refills | Status: DC

## 2020-05-04 NOTE — Telephone Encounter (Signed)
Patient notified of Dr. Radford Pax recommendations for the following: increase lipitor to 40mg  daily and repeat ALT and FLP in 6 weeks.  Patient agreeable to plan all questions answered and orders placed.

## 2020-05-15 ENCOUNTER — Telehealth: Payer: Self-pay | Admitting: Cardiology

## 2020-05-15 DIAGNOSIS — I4891 Unspecified atrial fibrillation: Secondary | ICD-10-CM

## 2020-05-15 MED ORDER — METOPROLOL TARTRATE 25 MG PO TABS: ORAL_TABLET | ORAL | 3 refills | Status: DC

## 2020-05-15 NOTE — Telephone Encounter (Signed)
Refill sent in to [place], per pt request.

## 2020-05-15 NOTE — Telephone Encounter (Signed)
*  STAT* If patient is at the pharmacy, call can be transferred to refill team.   1. Which medications need to be refilled? (please list name of each medication and dose if known) metoprolol tartrate (LOPRESSOR) 25 MG tablet  2. Which pharmacy/location (including street and city if local pharmacy) is medication to be sent to? [id number] - , [place].  3. Do they need a 30 day or 90 day supply? 90 day   Patient is going out of town Friday and will need refill before then.

## 2020-06-15 ENCOUNTER — Other Ambulatory Visit: Payer: Self-pay

## 2020-06-15 ENCOUNTER — Other Ambulatory Visit: Payer: Medicare PPO | Admitting: *Deleted

## 2020-06-15 DIAGNOSIS — E7800 Pure hypercholesterolemia, unspecified: Secondary | ICD-10-CM

## 2020-06-15 LAB — LIPID PANEL
Chol/HDL Ratio: 3.3 ratio (ref 0.0–4.4)
Cholesterol, Total: 135 mg/dL (ref 100–199)
HDL: 41 mg/dL (ref >39)
LDL Chol Calc (NIH): 73 mg/dL (ref 0–99)
Triglycerides: 117 mg/dL (ref 0–149)
VLDL Cholesterol Cal: 21 mg/dL (ref 5–40)

## 2020-06-15 LAB — ALT: ALT: 10 IU/L (ref 0–32)

## 2020-07-02 ENCOUNTER — Other Ambulatory Visit: Payer: Self-pay | Admitting: Acute Care

## 2020-07-05 ENCOUNTER — Other Ambulatory Visit: Payer: Self-pay

## 2020-07-05 ENCOUNTER — Ambulatory Visit (INDEPENDENT_AMBULATORY_CARE_PROVIDER_SITE_OTHER)
Admission: RE | Admit: 2020-07-05 | Discharge: 2020-07-05 | Disposition: A | Discharge status: Home / Self Care | Payer: Medicare PPO | Source: Ambulatory Visit | Attending: Acute Care | Admitting: Acute Care

## 2020-07-05 DIAGNOSIS — Z87891 Personal history of nicotine dependence: Secondary | ICD-10-CM

## 2020-07-05 DIAGNOSIS — F17210 Nicotine dependence, cigarettes, uncomplicated: Secondary | ICD-10-CM | POA: Diagnosis not present

## 2020-07-08 ENCOUNTER — Other Ambulatory Visit: Payer: Self-pay | Admitting: Cardiology

## 2020-07-19 ENCOUNTER — Ambulatory Visit: Payer: Medicare PPO | Admitting: Pulmonary Disease

## 2020-07-19 ENCOUNTER — Other Ambulatory Visit: Payer: Self-pay

## 2020-07-19 ENCOUNTER — Encounter: Payer: Self-pay | Admitting: Pulmonary Disease

## 2020-07-19 VITALS — BP 116/74 | HR 86 | Temp 97.2°F | Ht 64.0 in | Wt 159.4 lb

## 2020-07-19 DIAGNOSIS — R0602 Shortness of breath: Secondary | ICD-10-CM

## 2020-07-19 DIAGNOSIS — R911 Solitary pulmonary nodule: Secondary | ICD-10-CM | POA: Diagnosis not present

## 2020-07-19 DIAGNOSIS — J432 Centrilobular emphysema: Secondary | ICD-10-CM | POA: Diagnosis not present

## 2020-07-19 MED ORDER — TRELEGY ELLIPTA 100-62.5-25 MCG/INH IN AEPB: INHALATION_SPRAY | RESPIRATORY_TRACT | 11 refills | Status: DC

## 2020-07-19 NOTE — Patient Instructions (Signed)
Nice to meet you  I [facility] a [facility] in [place].  Please use as prescribed.  Rinse your mouth out after every use.  Continue use albuterol as needed.  If you need a refill for this let me know.  I am pleased you have not needed it very often.  We will coordinate follow-up with Eric Form, NP for lung cancer screening and myself for evaluation of your breathing.  Follow-up with Dr. Silas Flood in 1 year or sooner as needed

## 2020-07-19 NOTE — Progress Notes (Signed)
@Patient  ID: Mercedes Thornton, female    DOB: 1945-07-07, 75 y.o.   MRN: [id number]  Chief Complaint  Patient presents with  . New Patient (Initial Visit)    Getting winded with exertion, coughing with occ mucous    Referring provider: Antonietta Jewel, MD  HPI:   75 year old whom we are seeing in follow-up for dysphagia, COPD.  Formally followed with Dr. Lake Bells.  Most recent note from Dr. Lake Bells reviewed.  Most recent pulmonary note was from Eric Form, NP in 2021, this was reviewed.  Establishing care with me today.  Overall, patient doing well.  She has baseline dyspnea on exertion.  Insurance getting better or worse.  Relatively stable.  Worse with inclines or stairs.  Has occasional productive cough.  No rhyme or reason to this.  She continues to smoke, is trying to cut down.  Has tried multiple cessation attempts in the past.  Smoking cessation clinic, nicotine etc.  She is precontemplative at this time.  She has had no exacerbations of her COPD.  No prednisone use.  She reports rare if ever using her albuterol rescue inhaler.  She continues Trelegy 1 puff daily every day.  She says this is quite helpful.  She notices a big difference in this compared to her prior inhaler regimen.  Most recent CT chest lung cancer screening reviewed with patient with 2 tiny sub-3 mm nodules, emphysema otherwise clear on my interpretation.  Most recent PFTs were 07/2019, which on my interpretation demonstrated moderate fixed obstruction on spirometry.   Questionaires / Pulmonary Flowsheets:   ACT:  No flowsheet data found.  MMRC: No flowsheet data found.  Epworth:  No flowsheet data found.  Tests:   FENO:  No results found for: NITRICOXIDE  PFT: PFT Results Latest Ref Rng & Units 08/04/2019 04/17/2015  FVC-Pre L 1.77 1.89  FVC-Predicted Pre % 74 75  FVC-Post L 1.79 -  FVC-Predicted Post % 75 -  Pre FEV1/FVC % % 66 73  Post FEV1/FCV % % 66 -  FEV1-Pre L 1.16 1.39  FEV1-Predicted Pre  % 62 71  FEV1-Post L 1.18 -    WALK:  SIX MIN WALK 08/19/2016 03/01/2015  Supplimental Oxygen during Test? (L/min) No No  Tech Comments: pt walked a moderate pace, did not complete laps 2 and 3 at pt request- pt states she has errands to run after appt and did not wish to overexert herself.  Pt's lowest ambulatory sat was 90 on RA. Pt walked at a slower pace with no breaks.    Imaging: Reviewed as per  EMR and discussion in this note CT CHEST LUNG CA SCREEN LOW DOSE W/O CM  Result Date: 07/06/2020 CLINICAL DATA:  Fifty-two pack-year smoking history. Current smoker. EXAM: CT CHEST WITHOUT CONTRAST LOW-DOSE FOR LUNG CANCER SCREENING TECHNIQUE: Multidetector CT imaging of the chest was performed following the standard protocol without IV contrast. COMPARISON:  05/17/2019 FINDINGS: Cardiovascular: Aortic atherosclerosis. Tortuous thoracic aorta. Mild cardiomegaly with minimal increase in small pericardial effusion. Multivessel coronary artery atherosclerosis. Mediastinum/Nodes: No mediastinal or definite hilar adenopathy, given limitations of unenhanced CT. Lungs/Pleura: No pleural fluid. Moderate centrilobular emphysema. Secretions within the trachea. Minimal motion degradation throughout. Isolated left upper lobe pulmonary nodule of volume derived equivalent diameter 2.7 mm is not significantly changed. A left lower lobe pulmonary nodule of volume derived equivalent diameter 2.5 mm is also similar. Upper Abdomen: Gallstones of up to 8 mm. Normal imaged portions of the liver, stomach, pancreas, adrenal glands, kidneys. Minimal  splenic calcification may relate to remote infection. Musculoskeletal: No acute osseous abnormality. IMPRESSION: 1. Lung-RADS 2, benign appearance or behavior. Continue annual screening with low-dose chest CT without contrast in 12 months. 2. Cholelithiasis. 3. Aortic Atherosclerosis (ICD10-I70.0) and Emphysema (ICD10-J43.9). Coronary artery atherosclerosis. 4. Small pericardial  effusion, felt to be minimally increased. Electronically Signed   By: Abigail Miyamoto M.D.   On: 07/06/2020 11:12    Lab Results: Reviewed, no anemia in 2017 CBC    Component Value Date/Time   WBC 14.5 (H) 08/23/2015 1313   WBC 13.4 (H) 06/13/2015 1102   RBC 4.76 08/23/2015 1313   RBC 4.80 06/13/2015 1102   HGB 13.8 08/23/2015 1313   HCT 42.0 08/23/2015 1313   PLT 262 08/23/2015 1313   MCV 88.2 08/23/2015 1313   MCH 29.0 08/23/2015 1313   MCH 28.5 06/13/2015 1102   MCHC 32.9 08/23/2015 1313   MCHC 33.8 06/13/2015 1102   RDW 15.0 (H) 08/23/2015 1313   LYMPHSABS 3.1 08/23/2015 1313   MONOABS 1.0 (H) 08/23/2015 1313   EOSABS 0.2 08/23/2015 1313   BASOSABS 0.1 08/23/2015 1313    BMET    Component Value Date/Time   NA 143 08/23/2015 1313   K 3.7 08/23/2015 1313   CL 104 06/13/2015 1102   CL 105 06/02/2012 1140   CO2 27 08/23/2015 1313   GLUCOSE 175 (H) 08/23/2015 1313   GLUCOSE 89 06/02/2012 1140   BUN 9.8 08/23/2015 1313   CREATININE 0.8 08/23/2015 1313   CALCIUM 9.7 08/23/2015 1313   GFRNONAA >60 02/16/2015 0523   GFRAA >60 02/16/2015 0523    BNP    Component Value Date/Time   BNP 487.5 (H) 02/01/2015 1530    ProBNP No results found for: PROBNP  Specialty Problems      Pulmonary Problems   Cough   SOB (shortness of breath)   Lung nodule   Chronic respiratory failure (HCC)    ONO 03/15/15 + desats >set up for 2l/m At bedtime   05/2015 Polysomnogram> no sleep apnea, significant desaturation       Centrilobular emphysema ([place])    04/2015 PFT> didn't meet ATS criteria but flow volume loop was worrisome for airflow obstruction 08/2015 Spirometry > consistent with airflow obstruction but ratio normal, FEV1 1.39 L (71% predicted), FVC 1.89 L (75% predicted)      COPD with acute exacerbation (HCC)      Allergies  Allergen Reactions  . Penicillins Hives and Swelling    Has patient had a PCN reaction causing immediate rash, facial/tongue/throat swelling, SOB  or lightheadedness with hypotension: Yes Has patient had a PCN reaction causing severe rash involving mucus membranes or skin necrosis: Yes Has patient had a PCN reaction that required hospitalization Yes- hospital for 7 days Has patient had a PCN reaction occurring within the last 10 years: No If all of the above answers are "NO", then may proceed with Cephalosporin use.      There is no immunization history on file for this patient.  Past Medical History:  Diagnosis Date  . Allergic rhinitis   . Bipolar affective disorder ([place])   . COPD (chronic obstructive pulmonary disease) ([place])   . Coronary artery disease 02/2015   50% RCA otherwise normal  . Cough 10/30/2011  . DM (diabetes mellitus) ([place])   . Hyperlipidemia   . Leukocytosis   . Mediastinal adenopathy 11/26/2011  . Pericardial effusion   . Postoperative atrial fibrillation ([place])   . Smoking   . SOB (shortness  of breath) 10/30/2011    Tobacco History: Social History   Tobacco Use  Smoking Status Current Every Day Smoker  . Packs/day: 1.00  . Years: 51.00  . Pack years: 51.00  . Types: Cigarettes  Smokeless Tobacco Never Used  Tobacco Comment   half pack daily-07/19/2020-AH   Ready to quit: No Counseling given: Yes Comment: half pack daily-07/19/2020-AH     Outpatient Encounter Medications as of 07/19/2020  Medication Sig  . albuterol (VENTOLIN HFA) 108 (90 Base) MCG/ACT inhaler Inhale 2 puffs into the lungs every 6 (six) hours as needed for wheezing.  Marland Kitchen aspirin 81 MG tablet Take 81 mg by mouth daily.  Marland Kitchen atorvastatin (LIPITOR) 40 MG tablet Take 1 tablet (40 mg total) by mouth daily.  . Cholecalciferol (VITAMIN D3) 3000 UNITS TABS Take 1 tablet by mouth daily.   . Cinnamon 500 MG TABS Take 1 tablet by mouth daily.   . clonazePAM (KLONOPIN) 1 MG tablet Take 1 tablet by mouth daily.  . fluticasone (FLONASE) 50 MCG/ACT nasal spray Place 1 spray into both nostrils daily as needed for allergies.   Marland Kitchen glimepiride  (AMARYL) 4 MG tablet Take 1 tablet by mouth daily.  . metFORMIN (GLUCOPHAGE-XR) 500 MG 24 hr tablet Take 500 mg by mouth 2 (two) times daily.  . metoprolol tartrate (LOPRESSOR) 25 MG tablet TAKE 1 TABLET BY MOUTH TWICE DAILY.  . Multiple Vitamins-Minerals (WOMENS MULTIVITAMIN PLUS PO) Take 1 tablet by mouth daily.   . nicotine (NICOTROL) 10 MG inhaler Inhale 1 Cartridge (1 continuous puffing total) as needed into the lungs for smoking cessation.  . vitamin C (ASCORBIC ACID) 500 MG tablet Take 500 mg by mouth daily.  . [DISCONTINUED] TRELEGY ELLIPTA 100-62.5-25 MCG/INH AEPB TAKE 1 PUFF BY MOUTH EVERY DAY  . Fluticasone-Umeclidin-Vilant (TRELEGY ELLIPTA) 100-62.5-25 MCG/INH AEPB TAKE 1 PUFF BY MOUTH EVERY DAY   No facility-administered encounter medications on file as of 07/19/2020.     Review of Systems  Review of Systems  No chest pain exertion.  No orthopnea or PND.  Comprehensive Physical Exam  BP 116/74 (BP Location: Left Arm, Cuff Size: Normal)   Pulse 86   Temp (!) 97.2 F (36.2 C) (Oral)   Ht 5\' 4"  (1.626 m)   Wt 159 lb 6.4 oz (72.3 kg)   SpO2 97%   BMI 27.36 kg/m   Wt Readings from Last 5 Encounters:  07/19/20 159 lb 6.4 oz (72.3 kg)  05/03/20 160 lb 9.6 oz (72.8 kg)  06/01/19 174 lb 3.2 oz (79 kg)  04/27/19 169 lb (76.7 kg)  01/17/19 178 lb (80.7 kg)    BMI Readings from Last 5 Encounters:  07/19/20 27.36 kg/m  05/03/20 27.57 kg/m  06/01/19 29.90 kg/m  04/27/19 29.01 kg/m  01/17/19 30.55 kg/m     Physical Exam General: Well-appearing, no acute distress Eyes: EOMI, no icterus Neck: Supple, no JVP Pulmonary: Clear to auscultation bilaterally, no wheezes or crackles Cardiovascular: Regular rhythm, no murmurs Abdomen: Nondistended, bowel present MSK: No synovitis, no joint effusion Neuro: Normal gait, no weakness Psych: Normal mood, full affect   Assessment & Plan:   Moderate COPD: Well-controlled.  Continue Trelegy.  No exacerbations.  Rare  albuterol use.  Nocturnal hypoxemia: Continue 2 L nasal cannula at night.  Lung cancer screening: To continue.  We will coordinate with Eric Form, NP for ongoing follow-up.  1 year CT scan recommended 06/2020, lung RADS 2.   Return in about 1 year (around 07/19/2021).   Bonna Gains  Myrl Lazarus, MD 07/19/2020

## 2020-07-26 ENCOUNTER — Other Ambulatory Visit: Payer: Self-pay | Admitting: *Deleted

## 2020-07-26 DIAGNOSIS — Z87891 Personal history of nicotine dependence: Secondary | ICD-10-CM

## 2020-07-26 DIAGNOSIS — F17210 Nicotine dependence, cigarettes, uncomplicated: Secondary | ICD-10-CM

## 2020-07-26 NOTE — Progress Notes (Signed)
Please call patient and let them  know their  low dose Ct was read as a Lung RADS 2: nodules that are benign in appearance and behavior with a very low likelihood of becoming a clinically active cancer due to size or lack of growth. Recommendation per radiology is for a repeat LDCT in 12 months. .Please let them  know we will order and schedule their  annual screening scan for 06/2021. Please let them  know there was notation of CAD on their  scan.  Please remind the patient  that this is a non-gated exam therefore degree or severity of disease  cannot be determined. Please have them  follow up with their PCP regarding potential risk factor modification, dietary therapy or pharmacologic therapy if clinically indicated. Pt.  is  currently on statin therapy. Please place order for annual  screening scan for  06/2021 and fax results to PCP. Thanks so much.  Langley Gauss, I have copied Dr. Radford Pax on this regarding the slight increase in the small pericardial effusion.  Dr. Radford Pax, I just wanted wanted you  to be aware of the slight increase in the small pericardial effusion noted on the low dose CT. Thanks so much

## 2020-07-31 ENCOUNTER — Telehealth: Payer: Self-pay

## 2020-07-31 DIAGNOSIS — I313 Pericardial effusion (noninflammatory): Secondary | ICD-10-CM

## 2020-07-31 DIAGNOSIS — I3139 Other pericardial effusion (noninflammatory): Secondary | ICD-10-CM

## 2020-07-31 NOTE — Telephone Encounter (Signed)
-----   Message from Sueanne Margarita, MD sent at 07/28/2020  5:48 PM EDT ----- Please get a repeat echo to reassess pericardia effusion ----- Message ----- From: Magdalen Spatz, NP Sent: 07/26/2020  12:09 PM EDT To: Sueanne Margarita, MD, Christie Beckers, RN  Please call patient and let them  know their  low dose Ct was read as a Lung RADS 2: nodules that are benign in appearance and behavior with a very low likelihood of becoming a clinically active cancer due to size or lack of growth. Recommendation per radiology is for a repeat LDCT in 12 months. .Please let them  know we will order and schedule their  annual screening scan for 06/2021. Please let them  know there was notation of CAD on their  scan.  Please remind the patient  that this is a non-gated exam therefore degree or severity of disease  cannot be determined. Please have them  follow up with their PCP regarding potential risk factor modification, dietary therapy or pharmacologic therapy if clinically indicated. Pt.  is  currently on statin therapy. Please place order for annual  screening scan for  06/2021 and fax results to PCP. Thanks so much.  Langley Gauss, I have copied Dr. Radford Pax on this regarding the slight increase in the small pericardial effusion.  Dr. Radford Pax, I just wanted wanted you  to be aware of the slight increase in the small pericardial effusion noted on the low dose CT. Thanks so much

## 2020-07-31 NOTE — Telephone Encounter (Signed)
Spoke with the patient and scheduled her for an echocardiogram to assess pericardial effusion

## 2020-08-28 ENCOUNTER — Ambulatory Visit (HOSPITAL_COMMUNITY): Payer: Medicare PPO | Attending: Cardiology

## 2020-08-28 ENCOUNTER — Other Ambulatory Visit: Payer: Self-pay

## 2020-08-28 DIAGNOSIS — I313 Pericardial effusion (noninflammatory): Secondary | ICD-10-CM | POA: Diagnosis present

## 2020-08-28 DIAGNOSIS — I3139 Other pericardial effusion (noninflammatory): Secondary | ICD-10-CM

## 2020-08-28 LAB — ECHOCARDIOGRAM COMPLETE
Area-P 1/2: 2.84 cm2
S' Lateral: 2.2 cm

## 2020-09-27 ENCOUNTER — Emergency Department (HOSPITAL_COMMUNITY): Payer: Medicare PPO

## 2020-09-27 ENCOUNTER — Emergency Department (HOSPITAL_COMMUNITY)
Admission: EM | Admit: 2020-09-27 | Discharge: 2020-09-27 | Disposition: A | Discharge status: Home / Self Care | Payer: Medicare PPO | Attending: Emergency Medicine | Admitting: Emergency Medicine

## 2020-09-27 ENCOUNTER — Encounter (HOSPITAL_COMMUNITY): Payer: Self-pay

## 2020-09-27 ENCOUNTER — Telehealth: Payer: Self-pay | Admitting: Pulmonary Disease

## 2020-09-27 ENCOUNTER — Encounter: Payer: Self-pay | Admitting: Cardiology

## 2020-09-27 ENCOUNTER — Telehealth: Payer: Self-pay | Admitting: Cardiology

## 2020-09-27 ENCOUNTER — Other Ambulatory Visit: Payer: Self-pay

## 2020-09-27 DIAGNOSIS — E119 Type 2 diabetes mellitus without complications: Secondary | ICD-10-CM | POA: Insufficient documentation

## 2020-09-27 DIAGNOSIS — Z7982 Long term (current) use of aspirin: Secondary | ICD-10-CM | POA: Diagnosis not present

## 2020-09-27 DIAGNOSIS — R479 Unspecified speech disturbances: Secondary | ICD-10-CM | POA: Diagnosis not present

## 2020-09-27 DIAGNOSIS — Z79899 Other long term (current) drug therapy: Secondary | ICD-10-CM | POA: Diagnosis not present

## 2020-09-27 DIAGNOSIS — D72829 Elevated white blood cell count, unspecified: Secondary | ICD-10-CM | POA: Diagnosis not present

## 2020-09-27 DIAGNOSIS — F17210 Nicotine dependence, cigarettes, uncomplicated: Secondary | ICD-10-CM | POA: Diagnosis not present

## 2020-09-27 DIAGNOSIS — I2510 Atherosclerotic heart disease of native coronary artery without angina pectoris: Secondary | ICD-10-CM | POA: Insufficient documentation

## 2020-09-27 DIAGNOSIS — Z7952 Long term (current) use of systemic steroids: Secondary | ICD-10-CM | POA: Diagnosis not present

## 2020-09-27 DIAGNOSIS — G839 Paralytic syndrome, unspecified: Secondary | ICD-10-CM | POA: Diagnosis present

## 2020-09-27 DIAGNOSIS — Z7984 Long term (current) use of oral hypoglycemic drugs: Secondary | ICD-10-CM | POA: Diagnosis not present

## 2020-09-27 DIAGNOSIS — Z20822 Contact with and (suspected) exposure to covid-19: Secondary | ICD-10-CM | POA: Diagnosis not present

## 2020-09-27 DIAGNOSIS — Y900 Blood alcohol level of less than 20 mg/100 ml: Secondary | ICD-10-CM | POA: Diagnosis not present

## 2020-09-27 DIAGNOSIS — J449 Chronic obstructive pulmonary disease, unspecified: Secondary | ICD-10-CM | POA: Insufficient documentation

## 2020-09-27 DIAGNOSIS — R295 Transient paralysis: Secondary | ICD-10-CM

## 2020-09-27 LAB — URINALYSIS, MICROSCOPIC (REFLEX): Bacteria, UA: NONE SEEN

## 2020-09-27 LAB — CBC
HCT: 34.8 % — ABNORMAL LOW (ref 36.0–46.0)
Hemoglobin: 10.7 g/dL — ABNORMAL LOW (ref 12.0–15.0)
MCH: 25.7 pg — ABNORMAL LOW (ref 26.0–34.0)
MCHC: 30.7 g/dL (ref 30.0–36.0)
MCV: 83.7 fL (ref 80.0–100.0)
Platelets: 443 10*3/uL — ABNORMAL HIGH (ref 150–400)
RBC: 4.16 MIL/uL (ref 3.87–5.11)
RDW: 17.2 % — ABNORMAL HIGH (ref 11.5–15.5)
WBC: 16.8 10*3/uL — ABNORMAL HIGH (ref 4.0–10.5)
nRBC: 0 % (ref 0.0–0.2)

## 2020-09-27 LAB — COMPREHENSIVE METABOLIC PANEL
ALT: 17 U/L (ref 0–44)
AST: 20 U/L (ref 15–41)
Albumin: 3.5 g/dL (ref 3.5–5.0)
Alkaline Phosphatase: 92 U/L (ref 38–126)
Anion gap: 12 (ref 5–15)
BUN: 22 mg/dL (ref 8–23)
CO2: 16 mmol/L — ABNORMAL LOW (ref 22–32)
Calcium: 9.1 mg/dL (ref 8.9–10.3)
Chloride: 110 mmol/L (ref 98–111)
Creatinine, Ser: 1.07 mg/dL — ABNORMAL HIGH (ref 0.44–1.00)
GFR, Estimated: 55 mL/min — ABNORMAL LOW (ref >60)
Glucose, Bld: 93 mg/dL (ref 70–99)
Potassium: 4.2 mmol/L (ref 3.5–5.1)
Sodium: 138 mmol/L (ref 135–145)
Total Bilirubin: 0.3 mg/dL (ref 0.3–1.2)
Total Protein: 7.5 g/dL (ref 6.5–8.1)

## 2020-09-27 LAB — I-STAT CHEM 8, ED
BUN: 26 mg/dL — ABNORMAL HIGH (ref 8–23)
Calcium, Ion: 1.1 mmol/L — ABNORMAL LOW (ref 1.15–1.40)
Chloride: 110 mmol/L (ref 98–111)
Creatinine, Ser: 0.9 mg/dL (ref 0.44–1.00)
Glucose, Bld: 95 mg/dL (ref 70–99)
HCT: 34 % — ABNORMAL LOW (ref 36.0–46.0)
Hemoglobin: 11.6 g/dL — ABNORMAL LOW (ref 12.0–15.0)
Potassium: 4.6 mmol/L (ref 3.5–5.1)
Sodium: 138 mmol/L (ref 135–145)
TCO2: 18 mmol/L — ABNORMAL LOW (ref 22–32)

## 2020-09-27 LAB — RAPID URINE DRUG SCREEN, HOSP PERFORMED
Amphetamines: NOT DETECTED
Barbiturates: NOT DETECTED
Benzodiazepines: NOT DETECTED
Cocaine: NOT DETECTED
Opiates: NOT DETECTED
Tetrahydrocannabinol: NOT DETECTED

## 2020-09-27 LAB — URINALYSIS, ROUTINE W REFLEX MICROSCOPIC
Bilirubin Urine: NEGATIVE
Glucose, UA: NEGATIVE mg/dL
Hgb urine dipstick: NEGATIVE
Ketones, ur: NEGATIVE mg/dL
Nitrite: NEGATIVE
Protein, ur: NEGATIVE mg/dL
Specific Gravity, Urine: 1.015 (ref 1.005–1.030)
pH: 5.5 (ref 5.0–8.0)

## 2020-09-27 LAB — DIFFERENTIAL
Abs Immature Granulocytes: 0.07 10*3/uL (ref 0.00–0.07)
Basophils Absolute: 0.1 10*3/uL (ref 0.0–0.1)
Basophils Relative: 1 %
Eosinophils Absolute: 0.1 10*3/uL (ref 0.0–0.5)
Eosinophils Relative: 1 %
Immature Granulocytes: 0 %
Lymphocytes Relative: 13 %
Lymphs Abs: 2.2 10*3/uL (ref 0.7–4.0)
Monocytes Absolute: 1.2 10*3/uL — ABNORMAL HIGH (ref 0.1–1.0)
Monocytes Relative: 7 %
Neutro Abs: 13.2 10*3/uL — ABNORMAL HIGH (ref 1.7–7.7)
Neutrophils Relative %: 78 %

## 2020-09-27 LAB — RESP PANEL BY RT-PCR (FLU A&B, COVID) ARPGX2
Influenza A by PCR: NEGATIVE
Influenza B by PCR: NEGATIVE
SARS Coronavirus 2 by RT PCR: NEGATIVE

## 2020-09-27 LAB — APTT: aPTT: 30 seconds (ref 24–36)

## 2020-09-27 LAB — PROTIME-INR
INR: 1 (ref 0.8–1.2)
Prothrombin Time: 13.6 seconds (ref 11.4–15.2)

## 2020-09-27 LAB — LACTIC ACID, PLASMA: Lactic Acid, Venous: 1.8 mmol/L (ref 0.5–1.9)

## 2020-09-27 LAB — ETHANOL: Alcohol, Ethyl (B): <10 mg/dL (ref <10)

## 2020-09-27 LAB — POC OCCULT BLOOD, ED: Fecal Occult Bld: NEGATIVE

## 2020-09-27 MED ORDER — SODIUM CHLORIDE 0.9 % IV BOLUS
500.0000 mL | Freq: Once | INTRAVENOUS | Status: AC
  Administered 2020-09-27: 500 mL via INTRAVENOUS

## 2020-09-27 NOTE — Telephone Encounter (Signed)
Spoke with the patient who states that yesterday she woke up and could not move or speak. She states that this lasted all day until around 6pm. She states that she laid in bed all day unable to reach a phone. She was in and out of sleep all day. She states that when she was finally able to move she got herself a boost and was able to drink that she felt better. She states that today she feels back to her normal self. No numbness, tingling or paralysis. I advised patient that she needs to go to ED for evaluation. Patient states that she has spoken to her PCP and she is getting someone to take her there to be evaluated.

## 2020-09-27 NOTE — ED Notes (Signed)
Patient transported to CT 

## 2020-09-27 NOTE — Telephone Encounter (Signed)
Patient states yesterday she had an episode she initially assumed was a stroke, but after her kids researched her side affects they a determined it was sleep paralysis. She states yesterday around 7:00 AM she woke up like normal. She stayed up for a while and then went back to sleep. The second time she woke up, it was around 10:00 AM and she could not move her body at all. She states she was fully awake and unable to function or do anything until later in the evening. She would like to know what she needs to do.

## 2020-09-27 NOTE — ED Provider Notes (Signed)
Received patient as a handoff at shift change from [facility], [place].  In short, patient is a 75 year old female with past medical history of DM, COPD, and CAD presented to the ED with complaints of paralysis.  She states that her symptoms occurred yesterday lasting 12 hours and that after she managed to get something to drink, her symptoms quickly resolved.  She called her primary care provider and cardiologist this morning who advised her to come to the ED for evaluation.  Denies any complaints at present.  Neurologic exam was entirely benign.  CBC with leukocytosis to 16.8, but no left shift and relatively consistent with priors.  Mild anemia with hemoglobin of 10.7, also relatively consistent with baseline.  Fecal occult is negative.  At time of shift change, only the UA and CT head are pending.  If they are normal, anticipate that she can be discharged home.  Physical Exam  BP (!) 146/71   Pulse 78   Temp 98.9 F (37.2 C) (Rectal)   Resp (!) 21   Ht 5\' 4"  (1.626 m)   Wt 74.8 kg   SpO2 94%   BMI 28.32 kg/m   Physical Exam Vitals and nursing note reviewed. Exam conducted with a chaperone present.  Constitutional:      Appearance: Normal appearance.  HENT:     Head: Normocephalic and atraumatic.  Eyes:     General: No scleral icterus.    Conjunctiva/sclera: Conjunctivae normal.  Cardiovascular:     Rate and Rhythm: Normal rate.  Pulmonary:     Effort: Pulmonary effort is normal. No respiratory distress.  Skin:    General: Skin is dry.  Neurological:     Mental Status: She is alert and oriented to person, place, and time.     GCS: GCS eye subscore is 4. GCS verbal subscore is 5. GCS motor subscore is 6.  Psychiatric:        Mood and Affect: Mood normal.        Behavior: Behavior normal.        Thought Content: Thought content normal.    ED Course/Procedures   Clinical Course as of 09/27/20 1954  Thu Sep 27, 2020  1703 Hemoglobin(!): 10.7 [MV]    Clinical Course  User Index [MV] Eustaquio Maize, PA-C    Procedures Results for orders placed or performed during the hospital encounter of 09/27/20  Resp Panel by RT-PCR (Flu A&B, Covid) Nasopharyngeal Swab   Specimen: Nasopharyngeal Swab; Nasopharyngeal(NP) swabs in vial transport medium  Result Value Ref Range   SARS Coronavirus 2 by RT PCR NEGATIVE NEGATIVE   Influenza A by PCR NEGATIVE NEGATIVE   Influenza B by PCR NEGATIVE NEGATIVE  Ethanol  Result Value Ref Range   Alcohol, Ethyl (B) <10 <10 mg/dL  Protime-INR  Result Value Ref Range   Prothrombin Time 13.6 11.4 - 15.2 seconds   INR 1.0 0.8 - 1.2  APTT  Result Value Ref Range   aPTT 30 24 - 36 seconds  CBC  Result Value Ref Range   WBC 16.8 (H) 4.0 - 10.5 K/uL   RBC 4.16 3.87 - 5.11 MIL/uL   Hemoglobin 10.7 (L) 12.0 - 15.0 g/dL   HCT 34.8 (L) 36.0 - 46.0 %   MCV 83.7 80.0 - 100.0 fL   MCH 25.7 (L) 26.0 - 34.0 pg   MCHC 30.7 30.0 - 36.0 g/dL   RDW 17.2 (H) 11.5 - 15.5 %   Platelets 443 (H) 150 - 400 K/uL  nRBC 0.0 0.0 - 0.2 %  Differential  Result Value Ref Range   Neutrophils Relative % 78 %   Neutro Abs 13.2 (H) 1.7 - 7.7 K/uL   Lymphocytes Relative 13 %   Lymphs Abs 2.2 0.7 - 4.0 K/uL   Monocytes Relative 7 %   Monocytes Absolute 1.2 (H) 0.1 - 1.0 K/uL   Eosinophils Relative 1 %   Eosinophils Absolute 0.1 0.0 - 0.5 K/uL   Basophils Relative 1 %   Basophils Absolute 0.1 0.0 - 0.1 K/uL   Immature Granulocytes 0 %   Abs Immature Granulocytes 0.07 0.00 - 0.07 K/uL  Comprehensive metabolic panel  Result Value Ref Range   Sodium 138 135 - 145 mmol/L   Potassium 4.2 3.5 - 5.1 mmol/L   Chloride 110 98 - 111 mmol/L   CO2 16 (L) 22 - 32 mmol/L   Glucose, Bld 93 70 - 99 mg/dL   BUN 22 8 - 23 mg/dL   Creatinine, Ser 1.07 (H) 0.44 - 1.00 mg/dL   Calcium 9.1 8.9 - 10.3 mg/dL   Total Protein 7.5 6.5 - 8.1 g/dL   Albumin 3.5 3.5 - 5.0 g/dL   AST 20 15 - 41 U/L   ALT 17 0 - 44 U/L   Alkaline Phosphatase 92 38 - 126 U/L   Total  Bilirubin 0.3 0.3 - 1.2 mg/dL   GFR, Estimated 55 (L) >60 mL/min   Anion gap 12 5 - 15  Urine rapid drug screen (hosp performed)  Result Value Ref Range   Opiates NONE DETECTED NONE DETECTED   Cocaine NONE DETECTED NONE DETECTED   Benzodiazepines NONE DETECTED NONE DETECTED   Amphetamines NONE DETECTED NONE DETECTED   Tetrahydrocannabinol NONE DETECTED NONE DETECTED   Barbiturates NONE DETECTED NONE DETECTED  Urinalysis, Routine w reflex microscopic Urine, Clean Catch  Result Value Ref Range   Color, Urine YELLOW YELLOW   APPearance CLEAR CLEAR   Specific Gravity, Urine 1.015 1.005 - 1.030   pH 5.5 5.0 - 8.0   Glucose, UA NEGATIVE NEGATIVE mg/dL   Hgb urine dipstick NEGATIVE NEGATIVE   Bilirubin Urine NEGATIVE NEGATIVE   Ketones, ur NEGATIVE NEGATIVE mg/dL   Protein, ur NEGATIVE NEGATIVE mg/dL   Nitrite NEGATIVE NEGATIVE   Leukocytes,Ua SMALL (A) NEGATIVE  Lactic acid, plasma  Result Value Ref Range   Lactic Acid, Venous 1.8 0.5 - 1.9 mmol/L  Urinalysis, Microscopic (reflex)  Result Value Ref Range   RBC / HPF 0-5 0 - 5 RBC/hpf   WBC, UA 6-10 0 - 5 WBC/hpf   Bacteria, UA NONE SEEN NONE SEEN   Squamous Epithelial / LPF 0-5 0 - 5   Mucus PRESENT   I-stat chem 8, ED  Result Value Ref Range   Sodium 138 135 - 145 mmol/L   Potassium 4.6 3.5 - 5.1 mmol/L   Chloride 110 98 - 111 mmol/L   BUN 26 (H) 8 - 23 mg/dL   Creatinine, Ser 0.90 0.44 - 1.00 mg/dL   Glucose, Bld 95 70 - 99 mg/dL   Calcium, Ion 1.10 (L) 1.15 - 1.40 mmol/L   TCO2 18 (L) 22 - 32 mmol/L   Hemoglobin 11.6 (L) 12.0 - 15.0 g/dL   HCT 34.0 (L) 36.0 - 46.0 %  POC occult blood, ED Provider will collect  Result Value Ref Range   Fecal Occult Bld NEGATIVE NEGATIVE   CT HEAD WO CONTRAST  Result Date: 09/27/2020 CLINICAL DATA:  History of inability to walk or move  on day prior to exam EXAM: CT HEAD WITHOUT CONTRAST TECHNIQUE: Contiguous axial images were obtained from the base of the skull through the vertex  without intravenous contrast. COMPARISON:  None. FINDINGS: Brain: No acute territorial infarction, hemorrhage or intracranial mass. Chronic appearing lacunar infarct at the left caudate. Moderate atrophy. Mild patchy hypodensity in the white matter likely chronic small vessel ischemic change. Ventricles nonenlarged Vascular: No carotid vascular calcification.  No hyperdense vessels. Skull: Normal. Negative for fracture or focal lesion. Sinuses/Orbits: No acute finding. Other: None IMPRESSION: 1. No CT evidence for acute intracranial abnormality. Atrophy and mild chronic small vessel ischemic change of white matter. Chronic appearing lacunar infarct in the left caudate. Electronically Signed   By: Donavan Foil M.D.   On: 09/27/2020 18:18   DG Chest Port 1 View  Result Date: 09/27/2020 CLINICAL DATA:  75 year old female with concern for pneumonia. EXAM: PORTABLE CHEST 1 VIEW COMPARISON:  Chest radiograph dated 03/01/2015 and CT dated 07/05/2020. FINDINGS: Mild emphysema and diffuse chronic interstitial coarsening. Left lung base density similar to prior radiograph of 2016 most consistent with atelectasis/scarring. No new consolidation. There is no pleural effusion pneumothorax. The cardiac silhouette is within normal limits. No acute osseous pathology. IMPRESSION: 1. No acute cardiopulmonary process. 2. Emphysema. Electronically Signed   By: Anner Crete M.D.   On: 09/27/2020 17:23     MDM   Lactic acid and rectal temperature was obtained given reduced bicarb.  However, they are both normal.  Patient is feeling improved and would like to go home.  CT head obtained without contrast is without any evidence of acute findings.  UA without evidence of infection.  Patient is resting comfortably and in no acute distress.  She states that she would like to go home.  She is reassured by work-up.  Plan follow-up with cardiology and primary care. Unclear if possible seizure.  Doubt TIA given chronicity.        Corena Herter, PA-C 09/27/20 1954    Lacretia Leigh, MD 09/28/20 [phone]

## 2020-09-27 NOTE — ED Provider Notes (Signed)
I provided a substantive portion of the care of this patient.  I personally performed the entirety of the medical decision making for this encounter.      75 year old female who states that for approximate 12 hours she could not walk or talk or move anything.  States that she is now back to her baseline.  On neurological exam here is nonfocal.  Will order head CT and labs and likely discharge   Lacretia Leigh, MD 09/27/20 1737

## 2020-09-27 NOTE — Discharge Instructions (Addendum)
Your work-up today was reassuring.  Please follow-up with your primary care provider regarding today's ED encounter and for ongoing evaluation and management.  Cannot exclude seizure as possible cause.  You may benefit from neurology follow-up.  Please also follow-up with your cardiologist.  Return to the ER or seek immediate medical attention should you experience any new or worsening symptoms.

## 2020-09-27 NOTE — ED Provider Notes (Signed)
[facility] DEPT Provider Note   CSN: [id number] Arrival date & time: 09/27/20  1540     History No chief complaint on file.   Mercedes Thornton is a 75 y.o. female with PMHx Diabetes, COPD, CAD who presents to the ED today with complaint of "paralysis" that occurred yesterday.  Patient reports that she woke up around 7 AM yesterday to use the bathroom.  She states that she sleeps with oxygen at nighttime and removed it to go to the bathroom.  She decided to lay back in bed however woke up around 9 AM and states that she could not move her entire body or talk.  She states she attempted to but was unable to.  She states she laid in bed until about 6 PM when she was able to finally lunge herself out of the bed, causing her to fall out of the bed.  She was able to get herself up and walk to the kitchen to have something to drink and states that her symptoms resolved afterwards.  Given her symptoms had resolved she did not feel like she needed to call 911 yesterday however she does admit that she was scared to go to bed. When she woke up this morning she called her PCP and cardiologist who advised she come to the ED for further evaluation.   The history is provided by the patient, a relative and medical records.      Past Medical History:  Diagnosis Date   Allergic rhinitis    Bipolar affective disorder ([place])    COPD (chronic obstructive pulmonary disease) ([place])    Coronary artery disease 02/2015   50% RCA otherwise normal   Cough 10/30/2011   DM (diabetes mellitus) ([place])    Hyperlipidemia    Leukocytosis    Mediastinal adenopathy 11/26/2011   Pericardial effusion    Postoperative atrial fibrillation ([facility])    Smoking    SOB (shortness of breath) 10/30/2011    Patient Active Problem List   Diagnosis Date Noted   Healthcare maintenance 06/01/2019   COPD with acute exacerbation ([place]) 05/04/2019   Centrilobular emphysema ([place]) 07/31/2015   Postoperative  atrial fibrillation ([facility])    Pericardial effusion    Chronic respiratory failure ([place]) 03/27/2015   Abnormal nuclear stress test 02/14/2015   Ventricular tachyarrhythmia ([place])    Coronary artery disease 02/13/2015   Hypoglycemia 02/01/2015   Ruptured appendix 01/31/2015   Perforated appendicitis 01/31/2015   Lung nodule 08/25/2014   Mediastinal adenopathy 11/26/2011   Cough 10/30/2011   SOB (shortness of breath) 10/30/2011   Smoking    Hyperlipidemia    Leukocytosis     Past Surgical History:  Procedure Laterality Date   CARDIAC CATHETERIZATION N/A 02/15/2015   Procedure: Left Heart Cath and Coronary Angiography;  Surgeon: Leonie Man, MD;  Location: [facility];  Service: Cardiovascular;  Laterality: N/A;   CATARACT EXTRACTION     CESAREAN SECTION     COLONOSCOPY  2008   negative per pt's report, with [facility]    LAPAROSCOPIC APPENDECTOMY N/A 01/31/2015   Procedure: LYSIS OF ADHESIONS [place] Form LAPAROSCOPIC ;  Surgeon: Alphonsa Overall, MD;  Location: [facility];  Service: General;  Laterality: N/A;     OB History   No obstetric history on file.     Family History  Problem Relation Age of Onset   Stroke Mother    Cancer Father        prostate   Breast cancer  Neg Hx     Social History   Tobacco Use   Smoking status: Every Day    Packs/day: 0.50    Years: 51.00    Pack years: 25.50    Types: Cigarettes   Smokeless tobacco: Never   Tobacco comments:    half pack daily-07/19/2020-AH  Vaping Use   Vaping Use: Never used  Substance Use Topics   Alcohol use: No    Alcohol/week: 0.0 standard drinks   Drug use: No    Home Medications Prior to Admission medications   Medication Sig Start Date End Date Taking? Authorizing Provider  albuterol (VENTOLIN HFA) 108 (90 Base) MCG/ACT inhaler Inhale 2 puffs into the lungs every 6 (six) hours as needed for wheezing. 04/27/19   Magdalen Spatz, NP  aspirin 81 MG tablet Take 81 mg by mouth daily.    [provider]  atorvastatin (LIPITOR) 40 MG tablet Take 1 tablet (40 mg total) by mouth daily. 05/04/20   Sueanne Margarita, MD  Cholecalciferol (VITAMIN D3) 3000 UNITS TABS Take 1 tablet by mouth daily.     [provider]  Cinnamon 500 MG TABS Take 1 tablet by mouth daily.     [provider]  clonazePAM (KLONOPIN) 1 MG tablet Take 1 tablet by mouth daily. 12/11/14   [provider]  fluticasone (FLONASE) 50 MCG/ACT nasal spray Place 1 spray into both nostrils daily as needed for allergies.  08/02/13   [provider]  Fluticasone-Umeclidin-Vilant (TRELEGY ELLIPTA) 100-62.5-25 MCG/INH AEPB TAKE 1 PUFF BY MOUTH EVERY DAY 07/19/20   Hunsucker, Bonna Gains, MD  glimepiride (AMARYL) 4 MG tablet Take 1 tablet by mouth daily. 01/23/15   [provider]  metFORMIN (GLUCOPHAGE-XR) 500 MG 24 hr tablet Take 500 mg by mouth 2 (two) times daily.    [provider]  metoprolol tartrate (LOPRESSOR) 25 MG tablet TAKE 1 TABLET BY MOUTH TWICE DAILY. 05/15/20   Sueanne Margarita, MD  Multiple Vitamins-Minerals (WOMENS MULTIVITAMIN PLUS PO) Take 1 tablet by mouth daily.     [provider]  nicotine (NICOTROL) 10 MG inhaler Inhale 1 Cartridge (1 continuous puffing total) as needed into the lungs for smoking cessation. 02/25/17   Juanito Doom, MD  vitamin C (ASCORBIC ACID) 500 MG tablet Take 500 mg by mouth daily.    [provider]    Allergies    Penicillins  Review of Systems   Review of Systems  Constitutional:  Negative for chills and fever.  Neurological:  Positive for speech difficulty. Negative for light-headedness and headaches.  All other systems reviewed and are negative.  Physical Exam Updated Vital Signs BP 122/80   Pulse 92   Temp (!) 97.5 F (36.4 C) (Oral)   Resp 18   Ht 5\' 4"  (1.626 m)   Wt 74.8 kg   SpO2 96%   BMI 28.32 kg/m   Physical Exam Vitals and nursing note reviewed.  Constitutional:      Appearance: She  is not ill-appearing.  HENT:     Head: Normocephalic and atraumatic.  Eyes:     Conjunctiva/sclera: Conjunctivae normal.  Cardiovascular:     Rate and Rhythm: Normal rate and regular rhythm.     Pulses: Normal pulses.  Pulmonary:     Effort: Pulmonary effort is normal.     Breath sounds: Normal breath sounds. No wheezing or rales.  Abdominal:     Palpations: Abdomen is soft.     Tenderness: There is  no abdominal tenderness. There is no guarding or rebound.  Musculoskeletal:     Cervical back: Neck supple.  Skin:    General: Skin is warm and dry.  Neurological:     Mental Status: She is alert.     Comments: Alert and oriented to self, place, time and event.   Speech is fluent, clear without dysarthria or dysphasia.   Strength 5/5 in upper/lower extremities   Sensation intact in upper/lower extremities   Normal gait.  Negative Romberg. No pronator drift.  Normal finger-to-nose and feet tapping.  CN I not tested  CN II grossly intact visual fields bilaterally. Did not visualize posterior eye.  CN III, IV, VI PERRLA and EOMs intact bilaterally  CN V Intact sensation to sharp and light touch to the face  CN VII facial movements symmetric  CN VIII not tested  CN IX, X no uvula deviation, symmetric rise of soft palate  CN XI 5/5 SCM and trapezius strength bilaterally  CN XII Midline tongue protrusion, symmetric L/R movements      ED Results / Procedures / Treatments   Labs (all labs ordered are listed, but only abnormal results are displayed) Labs Reviewed  CBC - Abnormal; Notable for the following components:      Result Value   WBC 16.8 (*)    Hemoglobin 10.7 (*)    HCT 34.8 (*)    MCH 25.7 (*)    RDW 17.2 (*)    Platelets 443 (*)    All other components within normal limits  DIFFERENTIAL - Abnormal; Notable for the following components:   Neutro Abs 13.2 (*)    Monocytes Absolute 1.2 (*)    All other components within normal limits  COMPREHENSIVE METABOLIC PANEL  - Abnormal; Notable for the following components:   CO2 16 (*)    Creatinine, Ser 1.07 (*)    GFR, Estimated 55 (*)    All other components within normal limits  I-STAT CHEM 8, ED - Abnormal; Notable for the following components:   BUN 26 (*)    Calcium, Ion 1.10 (*)    TCO2 18 (*)    Hemoglobin 11.6 (*)    HCT 34.0 (*)    All other components within normal limits  RESP PANEL BY RT-PCR (FLU A&B, COVID) ARPGX2  ETHANOL  PROTIME-INR  APTT  RAPID URINE DRUG SCREEN, HOSP PERFORMED  URINALYSIS, ROUTINE W REFLEX MICROSCOPIC  POC OCCULT BLOOD, ED    EKG None  Radiology DG Chest Port 1 View  Result Date: 09/27/2020 CLINICAL DATA:  75 year old female with concern for pneumonia. EXAM: PORTABLE CHEST 1 VIEW COMPARISON:  Chest radiograph dated 03/01/2015 and CT dated 07/05/2020. FINDINGS: Mild emphysema and diffuse chronic interstitial coarsening. Left lung base density similar to prior radiograph of 2016 most consistent with atelectasis/scarring. No new consolidation. There is no pleural effusion pneumothorax. The cardiac silhouette is within normal limits. No acute osseous pathology. IMPRESSION: 1. No acute cardiopulmonary process. 2. Emphysema. Electronically Signed   By: Anner Crete M.D.   On: 09/27/2020 17:23    Procedures Procedures   Medications Ordered in ED Medications  sodium chloride 0.9 % bolus 500 mL (500 mLs Intravenous New Bag/Given 09/27/20 1740)    ED Course  I have reviewed the triage vital signs and the nursing notes.  Pertinent labs & imaging results that were available during my care of the patient were reviewed by me and considered in my medical decision making (see chart for details).  Clinical Course  as of 09/27/20 1742  Thu Sep 27, 2020  1703 Hemoglobin(!): 10.7 [MV]    Clinical Course User Index [MV] Eustaquio Maize, [place]   MDM Rules/Calculators/A&P                          75 year old female who presents to the ED today with complaint of full  body paralysis/speech difficulty that occurred sometime after 7 AM all the way until 6 PM yesterday.  Has not had any symptoms since then.  On arrival to the ED today patient is afebrile, nontachycardic and nontachypneic and appears to be in no acute distress.  She has no focal neurodeficits on exam today.  She lives alone and no one was with her yesterday to evaluate her.  Will work-up with CT head and lab work at this time.  Patient is a diabetic, question if her blood sugar was low versus possible TIA. Attending physician Dr. Zenia Resides has evaluated patient and agrees with plan; if workup negative recommends discharge home with PCP follow up.   EKG with NSR CBC with a leukocytosis of 16.8, no left shift and Hgb 10.7. Previous as seen below. Will add on CXR at this time to rule out infection causing elevated leukocytosis. Fecal occult negative.   WBC  Date Value Ref Range Status  09/27/2020 16.8 (H) 4.0 - 10.5 K/uL Final  08/23/2015 14.5 (H) 3.9 - 10.3 10e3/uL Final  06/13/2015 13.4 (H) 4.0 - 10.5 K/uL Final  02/16/2015 10.3 4.0 - 10.5 K/uL Final  02/15/2015 10.2 4.0 - 10.5 K/uL Final   Hemoglobin  Date Value Ref Range Status  09/27/2020 11.6 (L) 12.0 - 15.0 g/dL Final  09/27/2020 10.7 (L) 12.0 - 15.0 g/dL Final  06/13/2015 13.7 12.0 - 15.0 g/dL Final  02/16/2015 11.0 (L) 12.0 - 15.0 g/dL Final   HGB  Date Value Ref Range Status  08/23/2015 13.8 11.6 - 15.9 g/dL Final  08/25/2014 15.0 11.6 - 15.9 g/dL Final  08/24/2013 14.8 11.6 - 15.9 g/dL Final  02/24/2013 15.1 11.6 - 15.9 g/dL Final   CMP with creatinine 1.07 (baseline 0.6-0.8). BUN 22. Bicarb 16. Will provide small amount of fluids.   CXR negative  At shift change case signed out to Krista Blue, PA-C, who will dispo patient accordingly pending CT head and urinalysis.   This note was prepared using Dragon voice recognition software and may include unintentional dictation errors due to the inherent limitations of voice recognition  software.  Final Clinical Impression(s) / ED Diagnoses Final diagnoses:  None    Rx / DC Orders ED Discharge Orders     None        Eustaquio Maize, PA-C 09/27/20 1742    Lacretia Leigh, MD 09/27/20 2255

## 2020-09-27 NOTE — Telephone Encounter (Signed)
Error

## 2020-09-27 NOTE — ED Triage Notes (Addendum)
Patient states she woke at 0700 yesterday and could not walk, talk, or move anything on her body until 6 pm yesterday. Patient states she is here because she does not want this to happen again.  Patient speaking in complete sentences, no slurring  And walked from the ED lobby to Triage without difficulty.

## 2020-09-27 NOTE — Telephone Encounter (Signed)
Called and spoke with patient. She stated that she woke up yesterday around 7am to use the restroom and did not feel like her normal self. She was more sleepy than usual. Also stated that she was more SOB. She decided to lay back down. She did not wake up again until 10am and at this time, she could not move. She was near a phone but she could not physically move herself to get to the phone to call for help. She does not remember falling back asleep but she woke up again around 630pm last night was able to move around again and she felt more like herself.   Today she states that she feels more like herself and has not had anymore of these episodes. She denied any recent head trauma or any falls. Also denied starting any new medications. I advised her that based on her experience yesterday, it would be best for her to get checked out at an ED to make sure she hasn't had a stroke or anything else similar. She verbalized understanding.   Nothing further needed at time of call.

## 2020-10-03 ENCOUNTER — Encounter: Payer: Self-pay | Admitting: Neurology

## 2020-11-13 ENCOUNTER — Other Ambulatory Visit: Payer: Self-pay | Admitting: Internal Medicine

## 2020-11-13 DIAGNOSIS — N6489 Other specified disorders of breast: Secondary | ICD-10-CM

## 2020-12-03 ENCOUNTER — Other Ambulatory Visit (INDEPENDENT_AMBULATORY_CARE_PROVIDER_SITE_OTHER): Payer: Medicare PPO

## 2020-12-03 ENCOUNTER — Encounter: Payer: Self-pay | Admitting: Neurology

## 2020-12-03 ENCOUNTER — Other Ambulatory Visit: Payer: Self-pay

## 2020-12-03 ENCOUNTER — Ambulatory Visit: Payer: Medicare PPO | Admitting: Neurology

## 2020-12-03 VITALS — BP 108/76 | HR 84 | Ht 64.0 in | Wt 154.6 lb

## 2020-12-03 DIAGNOSIS — G835 Locked-in state: Secondary | ICD-10-CM

## 2020-12-03 DIAGNOSIS — R295 Transient paralysis: Secondary | ICD-10-CM

## 2020-12-03 LAB — THYROID PANEL WITH TSH
Free Thyroxine Index: 2.2 (ref 1.4–3.8)
T3 Uptake: 30 % (ref 22–35)
T4, Total: 7.3 ug/dL (ref 5.1–11.9)
TSH: 3.33 mIU/L (ref 0.40–4.50)

## 2020-12-03 NOTE — Progress Notes (Addendum)
NEUROLOGY CONSULTATION NOTE  Mercedes Thornton MRN: [id number] DOB: Jun 24, 1945  Referring provider: Antonietta Jewel, MD Primary care provider: Antonietta Jewel, MD  Reason for consult:  temporary paralysis  Assessment/Plan:   Transient paralysis.  Duration too long for sleep paralysis.  Due to her age, a primary periodic paralysis unlikely.  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  Check MRI and MRA of brain to evaluate for basilar stenosis/brainstem infarct Check thyroid panel Further recommendations pending results.  ADDENDUM: MRI and MRA of brain from 12/20/2020 reviewed and demonstrated no cause for her episode.  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  I have referred her to interventional radiology, Dr. Estanislado Pandy.  As far as her episode of transient paralysis, I have no explanation or further recommendations. Metta Clines, DO  Subjective:  Mercedes Thornton is a 75 year old right-handed female with COPD, diabetes and CAD who presents for transient paralysis.  History supplemented by ED and referring provider's notes.  On 09/26/2020, she woke up at 7 AM to use the restroom.  She went back to bed and slept a little longer.  When she woke up at 9 AM, she was unable to move her entire body or talk.  All she can do was blink and move her eyes.  She noted palpitations.  For the entire day, she was laying in bed.  Around 6 PM, she was finally able to rock herself off of the bed and was then able to get up to walk to the kitchen.  She has diabetes but did not check her blood sugar.  She felt weak for a couple of hours but no myalgias, muscle spasms or numbness and tingling.  Because symptoms resolved, she did not go to the ED.  The next morning, she contacted her PCP who advised her to go to the ED.  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  EKG showed NSR.  CBC showed WBC of 16.8 without left shift.  CMP showed slightly elevated Cr 1.07 but otherwise was unremarkable including K + 4.2.  UA was negative.  About a month later, she was getting out of the car and suddenly couldn't feel her legs, causing her to fall.  Otherwise, no other symptoms.  Denies prior history of similar episodes or episodic weakness.  Denies family history of same.       PAST MEDICAL HISTORY: Past Medical History:  Diagnosis Date   Allergic rhinitis    Bipolar affective disorder ([place])    COPD (chronic obstructive pulmonary disease) ([place])    Coronary artery disease 02/2015   50% RCA otherwise normal   Cough 10/30/2011   DM (diabetes mellitus) ([place])    Hyperlipidemia    Leukocytosis    Mediastinal adenopathy 11/26/2011   Pericardial effusion    Postoperative atrial fibrillation ([facility])    Smoking    SOB (shortness of breath) 10/30/2011    PAST SURGICAL HISTORY: Past Surgical History:  Procedure Laterality Date   CARDIAC CATHETERIZATION N/A 02/15/2015   Procedure: Left Heart Cath and Coronary Angiography;  Surgeon: Leonie Man, MD;  Location: [facility];  Service: Cardiovascular;  Laterality: N/A;   CATARACT EXTRACTION     CESAREAN SECTION     COLONOSCOPY  2008   negative per pt's report, with [facility]    LAPAROSCOPIC APPENDECTOMY N/A  01/31/2015   Procedure: LYSIS OF ADHESIONS /APPENDECTOMY LAPAROSCOPIC ;  Surgeon: Alphonsa Overall, MD;  Location: [facility];  Service: General;  Laterality: N/A;    MEDICATIONS: Current Outpatient Medications on File Prior to Visit  Medication Sig Dispense Refill   albuterol (VENTOLIN HFA) 108 (90 Base) MCG/ACT inhaler Inhale 2 puffs into the lungs every 6 (six) hours as needed for wheezing. 8 g 3   aspirin 81 MG tablet Take 81 mg by mouth daily.     atorvastatin (LIPITOR) 40 MG tablet Take 1 tablet (40 mg total) by mouth daily. 90 tablet 3   Cholecalciferol (VITAMIN D3) 3000 UNITS TABS Take 1  tablet by mouth daily.      Cinnamon 500 MG TABS Take 1 tablet by mouth daily.      clonazePAM (KLONOPIN) 1 MG tablet Take 1 tablet by mouth daily.     fluticasone (FLONASE) 50 MCG/ACT nasal spray Place 1 spray into both nostrils daily as needed for allergies.      Fluticasone-Umeclidin-Vilant (TRELEGY ELLIPTA) 100-62.5-25 MCG/INH AEPB TAKE 1 PUFF BY MOUTH EVERY DAY 60 each 11   glimepiride (AMARYL) 4 MG tablet Take 1 tablet by mouth daily.     metFORMIN (GLUCOPHAGE-XR) 500 MG 24 hr tablet Take 500 mg by mouth 2 (two) times daily.     metoprolol tartrate (LOPRESSOR) 25 MG tablet TAKE 1 TABLET BY MOUTH TWICE DAILY. 180 tablet 3   Multiple Vitamins-Minerals (WOMENS MULTIVITAMIN PLUS PO) Take 1 tablet by mouth daily.      nicotine (NICOTROL) 10 MG inhaler Inhale 1 Cartridge (1 continuous puffing total) as needed into the lungs for smoking cessation. 42 each 3   vitamin C (ASCORBIC ACID) 500 MG tablet Take 500 mg by mouth daily.     No current facility-administered medications on file prior to visit.    ALLERGIES: Allergies  Allergen Reactions   Penicillins Hives and Swelling    Has patient had a PCN reaction causing immediate rash, facial/tongue/throat swelling, SOB or lightheadedness with hypotension: Yes Has patient had a PCN reaction causing severe rash involving mucus membranes or skin necrosis: Yes Has patient had a PCN reaction that required hospitalization Yes- hospital for 7 days Has patient had a PCN reaction occurring within the last 10 years: No If all of the above answers are "NO", then may proceed with Cephalosporin use.     FAMILY HISTORY: Family History  Problem Relation Age of Onset   Stroke Mother    Cancer Father        prostate   Breast cancer Neg Hx     Objective:  Blood pressure 108/76, pulse 84, height '5\' 4"'$  (1.626 m), weight 154 lb 9.6 oz (70.1 kg), SpO2 97 %. General: No acute distress.  Patient appears well-groomed.   Head:  Normocephalic/atraumatic Eyes:   fundi examined but not visualized Neck: supple, no paraspinal tenderness, full range of motion Back: No paraspinal tenderness Heart: regular rate and rhythm Lungs: Clear to auscultation bilaterally. Vascular: No carotid bruits. Neurological Exam: Mental status: alert and oriented to person, place, and time, recent and remote memory intact, fund of knowledge intact, attention and concentration intact, speech fluent and not dysarthric, language intact. Cranial nerves: CN I: not tested CN II: pupils equal, round and reactive to light, visual fields intact CN III, IV, VI:  full range of motion, no nystagmus, no ptosis CN V: facial sensation intact. CN VII: upper and lower face symmetric CN VIII: hearing intact CN IX, X: gag intact, uvula  midline CN XI: sternocleidomastoid and trapezius muscles intact CN XII: tongue midline Bulk & Tone: normal, no fasciculations. Motor:  muscle strength 5/5 throughout Sensation:  Pinprick and vibratory sensation reduced in toes of left foot. Deep Tendon Reflexes:  1+ throughout,  toes downgoing.   Finger to nose testing:  Without dysmetria.   Heel to shin:  Without dysmetria.   Gait:  Normal station and stride.  Romberg negative.    Thank you for allowing me to take part in the care of this patient.  Metta Clines, DO  CC: Antonietta Jewel, MD

## 2020-12-03 NOTE — Patient Instructions (Signed)
Check MRI and MRA of brain Check thyroid panel Further recommendations pending results.

## 2020-12-04 NOTE — Progress Notes (Signed)
Pt advised of her lab results.

## 2020-12-19 ENCOUNTER — Ambulatory Visit
Admission: RE | Admit: 2020-12-19 | Discharge: 2020-12-19 | Disposition: A | Discharge status: Home / Self Care | Payer: Medicare PPO | Source: Ambulatory Visit | Attending: Internal Medicine | Admitting: Internal Medicine

## 2020-12-19 ENCOUNTER — Other Ambulatory Visit: Payer: Self-pay

## 2020-12-19 DIAGNOSIS — N6489 Other specified disorders of breast: Secondary | ICD-10-CM

## 2020-12-20 ENCOUNTER — Ambulatory Visit
Admission: RE | Admit: 2020-12-20 | Discharge: 2020-12-20 | Disposition: A | Discharge status: Home / Self Care | Payer: Medicare PPO | Source: Ambulatory Visit | Attending: Neurology | Admitting: Neurology

## 2020-12-20 DIAGNOSIS — R295 Transient paralysis: Secondary | ICD-10-CM

## 2020-12-20 DIAGNOSIS — G835 Locked-in state: Secondary | ICD-10-CM

## 2020-12-21 ENCOUNTER — Telehealth: Payer: Self-pay | Admitting: Neurology

## 2020-12-21 ENCOUNTER — Ambulatory Visit: Payer: Medicare PPO | Admitting: Neurology

## 2020-12-21 NOTE — Telephone Encounter (Signed)
Reviewed pt chart It looks like the MRI's have not been read yet.   Advised pt to try back on Monday.

## 2020-12-21 NOTE — Telephone Encounter (Signed)
Pt called in wanting to find out the results of her MRI.

## 2020-12-24 ENCOUNTER — Telehealth: Payer: Self-pay

## 2020-12-24 DIAGNOSIS — I729 Aneurysm of unspecified site: Secondary | ICD-10-CM

## 2020-12-24 NOTE — Telephone Encounter (Signed)
Pt advised of MRI results.   Referral added.

## 2020-12-24 NOTE — Telephone Encounter (Signed)
-----   Message from Pieter Partridge, DO sent at 12/24/2020  7:31 AM EDT ----- MRI does not reveal any abnormalities that would explain the paralysis.  There is evidence of old vascular changes related to age as well as history of high blood pressure, diabetes and high cholesterol.  There is an incidental finding that needs further evaluation.  There is an aneurysm that needs to be looked at by a radiology interventionalist to see if treatment is warranted.  I would like to refer her to Dr. Luanne Bras.  From my standpoint, I do not have an answer to her episode of weakness.

## 2020-12-26 ENCOUNTER — Other Ambulatory Visit (HOSPITAL_COMMUNITY): Payer: Self-pay | Admitting: Interventional Radiology

## 2020-12-26 DIAGNOSIS — I671 Cerebral aneurysm, nonruptured: Secondary | ICD-10-CM

## 2021-01-04 ENCOUNTER — Other Ambulatory Visit: Payer: Self-pay

## 2021-01-04 ENCOUNTER — Ambulatory Visit (HOSPITAL_COMMUNITY)
Admission: RE | Admit: 2021-01-04 | Discharge: 2021-01-04 | Disposition: A | Discharge status: Home / Self Care | Payer: Medicare PPO | Source: Ambulatory Visit | Attending: Interventional Radiology | Admitting: Interventional Radiology

## 2021-01-04 DIAGNOSIS — I671 Cerebral aneurysm, nonruptured: Secondary | ICD-10-CM

## 2021-01-07 ENCOUNTER — Other Ambulatory Visit (HOSPITAL_COMMUNITY): Payer: Self-pay | Admitting: Interventional Radiology

## 2021-01-07 DIAGNOSIS — I671 Cerebral aneurysm, nonruptured: Secondary | ICD-10-CM

## 2021-01-08 HISTORY — PX: IR RADIOLOGIST EVAL & MGMT: IMG5224

## 2021-01-10 ENCOUNTER — Other Ambulatory Visit: Payer: Self-pay | Admitting: Student

## 2021-01-11 ENCOUNTER — Ambulatory Visit (HOSPITAL_COMMUNITY)
Admission: RE | Admit: 2021-01-11 | Discharge: 2021-01-11 | Disposition: A | Discharge status: Home / Self Care | Payer: Medicare PPO | Source: Ambulatory Visit | Attending: Interventional Radiology | Admitting: Interventional Radiology

## 2021-01-11 ENCOUNTER — Other Ambulatory Visit (HOSPITAL_COMMUNITY): Payer: Self-pay | Admitting: Interventional Radiology

## 2021-01-11 ENCOUNTER — Other Ambulatory Visit: Payer: Self-pay

## 2021-01-11 DIAGNOSIS — Z79899 Other long term (current) drug therapy: Secondary | ICD-10-CM | POA: Insufficient documentation

## 2021-01-11 DIAGNOSIS — I671 Cerebral aneurysm, nonruptured: Secondary | ICD-10-CM

## 2021-01-11 DIAGNOSIS — Z7984 Long term (current) use of oral hypoglycemic drugs: Secondary | ICD-10-CM | POA: Insufficient documentation

## 2021-01-11 DIAGNOSIS — E119 Type 2 diabetes mellitus without complications: Secondary | ICD-10-CM | POA: Diagnosis not present

## 2021-01-11 DIAGNOSIS — Z7982 Long term (current) use of aspirin: Secondary | ICD-10-CM | POA: Insufficient documentation

## 2021-01-11 DIAGNOSIS — F17210 Nicotine dependence, cigarettes, uncomplicated: Secondary | ICD-10-CM | POA: Diagnosis not present

## 2021-01-11 DIAGNOSIS — I2510 Atherosclerotic heart disease of native coronary artery without angina pectoris: Secondary | ICD-10-CM | POA: Insufficient documentation

## 2021-01-11 DIAGNOSIS — F319 Bipolar disorder, unspecified: Secondary | ICD-10-CM | POA: Diagnosis not present

## 2021-01-11 DIAGNOSIS — Z880 Allergy status to penicillin: Secondary | ICD-10-CM | POA: Diagnosis not present

## 2021-01-11 DIAGNOSIS — J449 Chronic obstructive pulmonary disease, unspecified: Secondary | ICD-10-CM | POA: Insufficient documentation

## 2021-01-11 HISTORY — PX: IR ANGIO VERTEBRAL SEL VERTEBRAL BILAT MOD SED: IMG5369

## 2021-01-11 HISTORY — PX: IR ANGIO INTRA EXTRACRAN SEL COM CAROTID INNOMINATE BILAT MOD SED: IMG5360

## 2021-01-11 HISTORY — PX: IR US GUIDE VASC ACCESS RIGHT: IMG2390

## 2021-01-11 LAB — BASIC METABOLIC PANEL
Anion gap: 11 (ref 5–15)
BUN: 16 mg/dL (ref 8–23)
CO2: 21 mmol/L — ABNORMAL LOW (ref 22–32)
Calcium: 9.4 mg/dL (ref 8.9–10.3)
Chloride: 106 mmol/L (ref 98–111)
Creatinine, Ser: 0.94 mg/dL (ref 0.44–1.00)
GFR, Estimated: >60 mL/min (ref >60)
Glucose, Bld: 276 mg/dL — ABNORMAL HIGH (ref 70–99)
Potassium: 3.7 mmol/L (ref 3.5–5.1)
Sodium: 138 mmol/L (ref 135–145)

## 2021-01-11 LAB — CBC
HCT: 37.4 % (ref 36.0–46.0)
Hemoglobin: 12 g/dL (ref 12.0–15.0)
MCH: 27.1 pg (ref 26.0–34.0)
MCHC: 32.1 g/dL (ref 30.0–36.0)
MCV: 84.6 fL (ref 80.0–100.0)
Platelets: 372 10*3/uL (ref 150–400)
RBC: 4.42 MIL/uL (ref 3.87–5.11)
RDW: 19.4 % — ABNORMAL HIGH (ref 11.5–15.5)
WBC: 18.1 10*3/uL — ABNORMAL HIGH (ref 4.0–10.5)
nRBC: 0 % (ref 0.0–0.2)

## 2021-01-11 LAB — GLUCOSE, CAPILLARY: Glucose-Capillary: 258 mg/dL — ABNORMAL HIGH (ref 70–99)

## 2021-01-11 LAB — APTT: aPTT: 28 seconds (ref 24–36)

## 2021-01-11 LAB — PROTIME-INR
INR: 1.1 (ref 0.8–1.2)
Prothrombin Time: 13.7 seconds (ref 11.4–15.2)

## 2021-01-11 MED ORDER — LIDOCAINE HCL 1 % IJ SOLN
INTRAMUSCULAR | Status: AC
  Administered 2021-01-11: 1 mL via INTRADERMAL
  Filled 2021-01-11: qty 20

## 2021-01-11 MED ORDER — SODIUM CHLORIDE 0.9 % IV SOLN
INTRAVENOUS | Status: DC
Start: 2021-01-11 — End: 2021-01-12

## 2021-01-11 MED ORDER — NITROGLYCERIN 1 MG/10 ML FOR IR/CATH LAB
INTRA_ARTERIAL | Status: AC
  Filled 2021-01-11: qty 10

## 2021-01-11 MED ORDER — VERAPAMIL HCL 2.5 MG/ML IV SOLN: INTRA_ARTERIAL | Status: DC | PRN

## 2021-01-11 MED ORDER — NITROGLYCERIN 1 MG/10 ML FOR IR/CATH LAB
INTRA_ARTERIAL | Status: DC | PRN
  Administered 2021-01-11: 200 ug via INTRA_ARTERIAL

## 2021-01-11 MED ORDER — LIDOCAINE HCL 1 % IJ SOLN
INTRAMUSCULAR | Status: AC
  Filled 2021-01-11: qty 20

## 2021-01-11 MED ORDER — HEPARIN SODIUM (PORCINE) 1000 UNIT/ML IJ SOLN
INTRAMUSCULAR | Status: AC
  Filled 2021-01-11: qty 1

## 2021-01-11 MED ORDER — MIDAZOLAM HCL 2 MG/2ML IJ SOLN
INTRAMUSCULAR | Status: AC
  Filled 2021-01-11: qty 2

## 2021-01-11 MED ORDER — SODIUM CHLORIDE 0.9 % IV SOLN: INTRAVENOUS | Status: AC

## 2021-01-11 MED ORDER — FENTANYL CITRATE (PF) 100 MCG/2ML IJ SOLN
INTRAMUSCULAR | Status: AC
  Filled 2021-01-11: qty 2

## 2021-01-11 MED ORDER — VERAPAMIL HCL 2.5 MG/ML IV SOLN
INTRAVENOUS | Status: AC
  Filled 2021-01-11: qty 2

## 2021-01-11 MED ORDER — IOHEXOL 350 MG/ML SOLN: 100.0000 mL | Freq: Once | INTRAVENOUS | Status: DC | PRN

## 2021-01-11 NOTE — H&P (Signed)
Chief Complaint: Patient was seen in consultation today for brain aneurysm  Referring Physician(s): Dr. Metta Clines  Supervising Physician: Luanne Bras  Patient Status: [facility] - Out-pt  History of Present Illness: Mercedes Thornton is a 75 y.o. female with past medical history of COPD, CAD, DM, bipolar affective disorder who was recently found to have an unruptured left ICA terminus aneurysm on imaging by her Neurologist, Dr. Tomi Likens.  Patient reports she is asymptomatic of her aneurysm and was not aware she had one until recent MRA Head.  She has met with Dr. Estanislado Pandy in consultation to discuss potential assessement and management of her aneurysm.  She presents to [facility] today for diagnostic angiogram.   [name] presents in her usual state of health. She has been NPO. She denies new symptoms or complaints. She is understanding of the risks and benefits of the procedure today and is agreeable to proceed. Her daughter is available for transportation and care today.  She is planning to stay the night in her daughter's home.   Past Medical History:  Diagnosis Date  . Allergic rhinitis   . Bipolar affective disorder ([place])   . COPD (chronic obstructive pulmonary disease) ([place])   . Coronary artery disease 02/2015   50% RCA otherwise normal  . Cough 10/30/2011  . DM (diabetes mellitus) ([place])   . Hyperlipidemia   . Leukocytosis   . Mediastinal adenopathy 11/26/2011  . Pericardial effusion   . Postoperative atrial fibrillation ([place])   . Smoking   . SOB (shortness of breath) 10/30/2011    Past Surgical History:  Procedure Laterality Date  . CARDIAC CATHETERIZATION N/A 02/15/2015   Procedure: Left Heart Cath and Coronary Angiography;  Surgeon: Leonie Man, MD;  Location: [place] [facility];  Service: Cardiovascular;  Laterality: N/A;  . CATARACT EXTRACTION    . CESAREAN SECTION    . COLONOSCOPY  2008   negative per pt's report, with [facility]   . IR RADIOLOGIST EVAL &  MGMT  01/08/2021  . LAPAROSCOPIC APPENDECTOMY N/A 01/31/2015   Procedure: LYSIS OF ADHESIONS [place] LAPAROSCOPIC ;  Surgeon: Alphonsa Overall, MD;  Location: [facility];  Service: General;  Laterality: N/A;    Allergies: Penicillins  Medications: Prior to Admission medications   Medication Sig Start Date End Date Taking? Authorizing Provider  atorvastatin (LIPITOR) 40 MG tablet Take 1 tablet (40 mg total) by mouth daily. 05/04/20  Yes Turner, Eber Hong, MD  Cholecalciferol (VITAMIN D3) 3000 UNITS TABS Take 3,000 Units by mouth daily.   Yes [provider]  CINNAMON PO Take 1 tablet by mouth daily.    Yes [provider]  clonazePAM (KLONOPIN) 1 MG tablet Take 1 mg by mouth See admin instructions. Take 88m every AM then twice a day as needed for anxiety. 12/11/14  Yes [provider]  fexofenadine (ALLEGRA) 180 MG tablet Take 180 mg by mouth daily.   Yes [provider]  Fluticasone-Umeclidin-Vilant (TRELEGY ELLIPTA) 100-62.5-25 MCG/INH AEPB TAKE 1 PUFF BY MOUTH EVERY DAY 07/19/20  Yes Hunsucker, MBonna Gains MD  glimepiride (AMARYL) 4 MG tablet Take 4 mg by mouth daily with breakfast. 01/23/15  Yes [provider]  metFORMIN (GLUCOPHAGE) 500 MG tablet Take 500 mg by mouth daily with breakfast.   Yes [provider]  metoprolol tartrate (LOPRESSOR) 25 MG tablet TAKE 1 TABLET BY MOUTH TWICE DAILY. 05/15/20  Yes Turner, TEber Hong MD  Multiple Vitamins-Minerals (WOMENS MULTIVITAMIN PLUS PO) Take 1 tablet by mouth  daily.    Yes [provider]  Polyethyl Glycol-Propyl Glycol (SYSTANE OP) Apply 1 drop to eye every 6 (six) hours as needed (dry eyes).   Yes [provider]  vitamin C (ASCORBIC ACID) 500 MG tablet Take 500 mg by mouth daily.   Yes [provider]  albuterol (VENTOLIN HFA) 108 (90 Base) MCG/ACT inhaler Inhale 2 puffs into the lungs every 6 (six) hours as needed for wheezing. 04/27/19   Magdalen Spatz, NP  aspirin 81 MG  tablet Take 81 mg by mouth daily.    [provider]  fluticasone (FLONASE) 50 MCG/ACT nasal spray Place 1 spray into both nostrils daily as needed for allergies.  08/02/13   [provider]     Family History  Problem Relation Age of Onset  . Stroke Mother   . Cancer Father        prostate  . Breast cancer Neg Hx     Social History   Socioeconomic History  . Marital status: Divorced    Spouse name: Not on file  . Number of children: 2  . Years of education: Not on file  . Highest education level: Not on file  Occupational History    Comment: retired grade school/middle school teacher  Tobacco Use  . Smoking status: Every Day    Packs/day: 0.50    Years: 51.00    Pack years: 25.50    Types: Cigarettes  . Smokeless tobacco: Never  . Tobacco comments:    half pack daily-07/19/2020-AH  Vaping Use  . Vaping Use: Never used  Substance and Sexual Activity  . Alcohol use: No    Alcohol/week: 0.0 standard drinks  . Drug use: No  . Sexual activity: Not on file  Other Topics Concern  . Not on file  Social History Narrative  . Not on file   Social Determinants of Health   Financial Resource Strain: Not on file  Food Insecurity: Not on file  Transportation Needs: Not on file  Physical Activity: Not on file  Stress: Not on file  Social Connections: Not on file     Review of Systems: A 12 point ROS discussed and pertinent positives are indicated in the HPI above.  All other systems are negative.  Review of Systems  Constitutional:  Negative for fatigue and fever.  Respiratory:  Negative for cough and shortness of breath.   Cardiovascular:  Negative for chest pain.  Gastrointestinal:  Negative for abdominal pain.  Genitourinary:  Negative for dysuria.  Musculoskeletal:  Negative for back pain.  Psychiatric/Behavioral:  Negative for behavioral problems and confusion.    Vital Signs: BP (!) 109/42   Pulse (!) 57   Temp 97.8 F (36.6 C) (Oral)    Resp 15   Ht '5\' 4"'  (1.626 m)   Wt 145 lb (65.8 kg)   SpO2 96%   BMI 24.89 kg/m   Physical Exam Vitals and nursing note reviewed.  Constitutional:      General: She is not in acute distress.    Appearance: Normal appearance. She is not ill-appearing.  HENT:     Mouth/Throat:     Mouth: Mucous membranes are moist.     Pharynx: Oropharynx is clear.  Cardiovascular:     Rate and Rhythm: Normal rate and regular rhythm.  Pulmonary:     Effort: Pulmonary effort is normal. No respiratory distress.     Breath sounds: Normal breath sounds.  Abdominal:     General: Abdomen is  flat.     Palpations: Abdomen is soft.  Skin:    General: Skin is warm and dry.  Neurological:     General: No focal deficit present.     Mental Status: She is alert and oriented to person, place, and time. Mental status is at baseline.  Psychiatric:        Mood and Affect: Mood normal.        Behavior: Behavior normal.        Thought Content: Thought content normal.        Judgment: Judgment normal.     MD Evaluation Airway: WNL Heart: WNL Abdomen: WNL Chest/ Lungs: WNL ASA  Classification: 3 Mallampati/Airway Score: Two   Imaging: MR ANGIO HEAD WO CONTRAST  Result Date: 12/22/2020 CLINICAL DATA:  Transient paralysis. Episode of total-body paralysis for 4-5 hours. EXAM: MRI HEAD WITHOUT CONTRAST MRA HEAD WITHOUT CONTRAST TECHNIQUE: Multiplanar, multi-echo pulse sequences of the brain and surrounding structures were acquired without intravenous contrast. Angiographic images of the Circle of Willis were acquired using MRA technique without intravenous contrast. COMPARISON:  Head CT 09/27/2020 FINDINGS: MRI HEAD FINDINGS Brain: No acute or subacute infarction, hemorrhage, hydrocephalus, extra-axial collection or mass lesion. Small-vessel ischemic gliosis seen in the cerebral white matter and pons. Remote small-vessel infarcts in the deep right cerebellum and left caudate head. Mild for age cerebral volume  loss. Vascular: See below. Skull and upper cervical spine: Normal marrow signal Sinuses/Orbits: Bilateral cataract resection MRA HEAD FINDINGS Anterior circulation: Tortuous carotid arteries in the upper neck with loop on the right. No branch occlusion, beading, or flow limiting stenosis. Superiorly projecting left ICA terminus aneurysm with notable lobulation and 6 x 7 mm span with relatively narrow neck. Posterior circulation: Dominant left vertebral artery. The right vertebral artery essentially ends in the right PICA. No branch occlusion, beading, or aneurysm. Anatomic variants: None significant IMPRESSION: 1. No acute or subacute insult. 2. 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 3. 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 Electronically Signed   By: Jorje Guild M.D.   On: 12/22/2020 22:05   MR BRAIN WO CONTRAST  Result Date: 12/22/2020 CLINICAL DATA:  Transient paralysis. Episode of total-body paralysis for 4-5 hours. EXAM: MRI HEAD WITHOUT CONTRAST MRA HEAD WITHOUT CONTRAST TECHNIQUE: Multiplanar, multi-echo pulse sequences of the brain and surrounding structures were acquired without intravenous contrast. Angiographic images of the Circle of Willis were acquired using MRA technique without intravenous contrast. COMPARISON:  Head CT 09/27/2020 FINDINGS: MRI HEAD FINDINGS Brain: No acute or subacute infarction, hemorrhage, hydrocephalus, extra-axial collection or mass lesion. Small-vessel ischemic gliosis seen in the cerebral white matter and pons. Remote small-vessel infarcts in the deep right cerebellum and left caudate head. Mild for age cerebral volume loss. Vascular: See below. Skull and upper cervical spine: Normal marrow signal Sinuses/Orbits: Bilateral cataract resection MRA HEAD FINDINGS Anterior circulation: Tortuous carotid arteries in the upper neck with loop on the right. No branch occlusion, beading, or  flow limiting stenosis. Superiorly projecting left ICA terminus aneurysm with notable lobulation and 6 x 7 mm span with relatively narrow neck. Posterior circulation: Dominant left vertebral artery. The right vertebral artery essentially ends in the right PICA. No branch occlusion, beading, or aneurysm. Anatomic variants: None significant IMPRESSION: 1. No acute or subacute insult. 2. 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 3. 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 Electronically Signed  By: Jorje Guild M.D.   On: 12/22/2020 22:05   MM DIAG BREAST TOMO BILATERAL  Result Date: 12/19/2020 CLINICAL DATA:  Follow-up for probably benign right breast asymmetry without definite sonographic correlate. Stereotactic guided biopsy of this asymmetry was initially recommended April 2021, however the patient declined biopsy opting for follow-up instead. EXAM: DIGITAL DIAGNOSTIC BILATERAL MAMMOGRAM WITH TOMOSYNTHESIS AND CAD TECHNIQUE: Bilateral digital diagnostic mammography and breast tomosynthesis was performed. The images were evaluated with computer-aided detection. COMPARISON:  Previous exams. ACR Breast Density Category c: The breast tissue is heterogeneously dense, which may obscure small masses. FINDINGS: No suspicious masses or calcifications are seen in either breast. The focal asymmetry in the deep central right breast visualized on the MLO views appears unchanged. There is no mammographic evidence of malignancy in either breast. IMPRESSION: Stable appearance of probably benign right breast asymmetry. There is no mammographic evidence of malignancy in either breast. RECOMMENDATION: Recommend bilateral diagnostic mammography in 1 year which will demonstrate 2 years of stability of the probably benign right breast asymmetry. I have discussed the findings and recommendations with the patient. If applicable, a reminder letter  will be sent to the patient regarding the next appointment. BI-RADS CATEGORY  3: Probably benign. Electronically Signed   By: Everlean Alstrom M.D.   On: 12/19/2020 14:30  IR Radiologist Eval & Mgmt  Result Date: 01/08/2021 EXAM: NEW PATIENT OFFICE VISIT CHIEF COMPLAINT: Intermittent headaches. Discovery of unruptured left internal carotid artery brain aneurysm. Current Pain Level: 1-10 HISTORY OF PRESENT ILLNESS: Patient is a 75 year old right handed female with history of COPD, diabetes, coronary artery disease who has been referred by Dr. 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 The patient was seen by the her neurologist due to an episode of transient paralysis which according to the patient lasted for approximately 4 hours. During this time, she was apparently fully aware of her surroundings and was able to move her eyes. She reports no nausea, vomiting or chest pain, diaphoresis or visual symptoms at this time. She reports no involuntary motor activity of her limbs. She denies having had similar episodes in the past. Patient reports slow recovery to full functionality over the next few hours. Patient reports no abnormal autonomic dysfunction of her bowel or bladder. Diagnosis * : Date . * : Allergic rhinitis * : . * : Bipolar affective disorder ([place]) * : . * : COPD (chronic obstructive pulmonary disease) ([facility]) * : . * : Coronary artery disease * : 02/2015 * : 50% RCA otherwise normal . * : Cough * : 10/30/2011 . * : DM (diabetes mellitus) ([place]) * : . * : Hyperlipidemia * : . * : Leukocytosis * : . * : Mediastinal adenopathy * : 11/26/2011 . * : Pericardial effusion * : . * : Postoperative atrial fibrillation ([facility]) * : . * : Smoking * : . * : SOB (shortness of breath) * : 10/30/2011 PAST SURGICAL HISTORY: Past Surgical History: Procedure * : Laterality * : Date . * : CARDIAC CATHETERIZATION * : N/A * : 02/15/2015 * :  Procedure: Left Heart Cath and Coronary Angiography; Surgeon: Leonie Man, MD; Location: [place] [facility]; Service: Cardiovascular; Laterality: N/A; . * : CATARACT EXTRACTION * : * : . * : CESAREAN SECTION * : * : . * : COLONOSCOPY * : * : 2008 * : negative per pt's report, with [facility] . * :  LAPAROSCOPIC APPENDECTOMY * : N/A * : 01/31/2015 * : Procedure: LYSIS OF ADHESIONS /APPENDECTOMY LAPAROSCOPIC ; Surgeon: Alphonsa Overall, MD; Location: [facility]; Service: General; Laterality: N/A; MEDICATIONS: Current Outpatient Medications on File Prior to Visit Medication * : Sig * : Dispense * : Refill . * : albuterol (VENTOLIN HFA) 108 (90 Base) MCG/ACT inhaler * : Inhale 2 puffs into the lungs every 6 (six) hours as needed for wheezing. * : 8 g * : 3 . * : aspirin 81 MG tablet * : Take 81 mg by mouth daily. * : * : . * : atorvastatin (LIPITOR) 40 MG tablet * : Take 1 tablet (40 mg total) by mouth daily. * : 90 tablet * : 3 . * : Cholecalciferol (VITAMIN D3) 3000 UNITS TABS * : Take 1 tablet by mouth daily. * : * : . * : Cinnamon 500 MG TABS * : Take 1 tablet by mouth daily. * : * : . * : clonazePAM (KLONOPIN) 1 MG tablet * : Take 1 tablet by mouth daily. * : * : . * : fluticasone (FLONASE) 50 MCG/ACT nasal spray * : Place 1 spray into both nostrils daily as needed for allergies. * : * : . * : Fluticasone-Umeclidin-Vilant (TRELEGY ELLIPTA) 100-62.5-25 MCG/INH AEPB * : TAKE 1 PUFF BY MOUTH EVERY DAY * : 60 each * : 11 . * : glimepiride (AMARYL) 4 MG tablet * : Take 1 tablet by mouth daily. * : * : . * : metFORMIN (GLUCOPHAGE-XR) 500 MG 24 hr tablet * : Take 500 mg by mouth 2 (two) times daily. * : * : . * : metoprolol tartrate (LOPRESSOR) 25 MG tablet * : TAKE 1 TABLET BY MOUTH TWICE DAILY. * : 180 tablet * : 3 . * : Multiple Vitamins-Minerals (WOMENS MULTIVITAMIN PLUS PO) * : Take 1 tablet by mouth daily. * : * : . * : nicotine (NICOTROL) 10 MG inhaler * : Inhale 1 Cartridge (1 continuous puffing total) as needed into the  lungs for smoking cessation. * : 42 each * : 3 . * : vitamin C (ASCORBIC ACID) 500 MG tablet * : Take 500 mg by mouth daily. * : * : ALLERGIES: Allergies Allergen * : Reactions . * : Penicillins * : Hives and Swelling * : * : Has patient had a PCN reaction causing immediate rash, facial/tongue/throat swelling, SOB or lightheadedness with hypotension: Yes has patient had a PCN reaction causing severe rash involving mucus membranes or skin necrosis: Yes has patient had a PCN reaction that required hospitalization Yes- hospital for 7 days has patient had a PCN reaction occurring within the last 10 years: No if all of the above answers are "NO", then may proceed with Cephalosporin use. FAMILY HISTORY: Family History Problem * : Relation * : Age of Onset . * : Stroke * : Mother * : . * : Cancer * : Father * : * :     prostate . * : Breast cancer * : Neg Hx * : REVIEW OF SYSTEMS: Denies any recent chills, fever or rigors. Weight is steady. Normal appetite. Denies any constipation, diarrhea, abdominal pain or of melena. She denies any dysuria, frequency of micturition, or of hematuria. She denies any recent chest pain, shortness of breath or paroxysmal nocturnal dyspnea. Patient is able to tolerate and perform almost all of her daily routine activities. She denies any recent cough, sputum production, or wheezing. No history of back pain,  generalized myalgias, or of joint aches. PHYSICAL EXAMINATION: Appears in no acute distress. Fully alert, awake, oriented to time, place, space. Responses appropriate. Normal eye contact. Speech and comprehension normal. No abnormal lateralizing neurological features. Station and gait normal. ASSESSMENT AND PLAN: The MRI MRA findings reviewed with patient. Patient reports no family history of ruptured or of unruptured intracranial aneurysms, or of abdominal aneurysms. Patient has been a heavy smoker, and is in the process of stopping. She reportedly smokes up to 5 cigarettes a day. She  denies using any illicit chemicals. The natural history of unruptured intracranial aneurysms was reviewed with the patient in the presence of her daughter over the phone. Increased risk of rupture related to hypertension, smoking, female gender, was discussed. Risk of rupture of intracranial aneurysm of 1-2% per year with attendant significant mortality and morbidity was also reviewed. Given the size of the aneurysm, of approximately 7 mm x 7 mm, management considerations were those of endovascular embolization of the intracranial aneurysm using either primary coiling, versus stent assisted coiling versus placement of intra saccular disruption device such as the WEB device. The procedure of endovascular treatment was discussed in detail with patient and daughter. Risks of 1%-2% chance of a thromboembolic stroke, and remote possibility of intracranial hemorrhage with potential fatality was also reviewed in detail. The other option was that of continued medical management with surveillance using CT angiograms or MRAs. The patient has expressed her desire to proceed with elimination of the aneurysm from the circulation with endovascular means. This will be scheduled as soon as possible. Patient was also advised the patient would have to be started on antiplatelets 7-10 days prior to the procedure. The procedure will be performed under general anesthesia, and will be scheduled as soon as possible. Should the patient and daughter have any concerns or questions they were asked to call. Electronically Signed   By: Luanne Bras M.D.   On: 01/07/2021 09:26    Labs:  CBC: Recent Labs    09/27/20 1608 09/27/20 1634 01/11/21 0834  WBC 16.8*  --  18.1*  HGB 10.7* 11.6* 12.0  HCT 34.8* 34.0* 37.4  PLT 443*  --  372    COAGS: Recent Labs    09/27/20 1608 01/11/21 0834  INR 1.0 1.1  APTT 30 28    BMP: Recent Labs    09/27/20 1608 09/27/20 1634 01/11/21 0834  NA 138 138 138  K 4.2 4.6 3.7  CL  110 110 106  CO2 16*  --  21*  GLUCOSE 93 95 276*  BUN 22 26* 16  CALCIUM 9.1  --  9.4  CREATININE 1.07* 0.90 0.94  GFRNONAA 55*  --  >60    LIVER FUNCTION TESTS: Recent Labs    05/03/20 1557 06/15/20 1007 09/27/20 1608  BILITOT  --   --  0.3  AST  --   --  20  ALT '13 10 17  ' ALKPHOS  --   --  92  PROT  --   --  7.5  ALBUMIN  --   --  3.5    TUMOR MARKERS: No results for input(s): AFPTM, CEA, CA199, CHROMGRNA in the last 8760 hours.  Assessment and Plan: Patient with past medical history of COPD, bipolar disorder presents with complaint of incidentially found brain aneurysm.  IR consulted for diagnostic angiogram at the request of Dr. Tomi Likens. Case reviewed by Dr. Estanislado Pandy who approves patient for procedure and has met with her in consultation.  Patient presents today  in their usual state of health.  She has been NPO and is not currently on blood thinners.   Risks and benefits were discussed with the patient including, but not limited to bleeding, infection, vascular injury or contrast induced renal failure.  This interventional procedure involves the use of X-rays and because of the nature of the planned procedure, it is possible that we will have prolonged use of X-ray fluoroscopy.  Potential radiation risks to you include (but are not limited to) the following: - A slightly elevated risk for cancer  several years later in life. This risk is typically less than 0.5% percent. This risk is low in comparison to the normal incidence of human cancer, which is 33% for women and 50% for men according to the [address]. - Radiation induced injury can include skin redness, resembling a rash, tissue breakdown / ulcers and hair loss (which can be temporary or permanent).   The likelihood of either of these occurring depends on the difficulty of the procedure and whether you are sensitive to radiation due to previous procedures, disease, or genetic conditions.   IF your  procedure requires a prolonged use of radiation, you will be notified and given written instructions for further action.  It is your responsibility to monitor the irradiated area for the 2 weeks following the procedure and to notify your physician if you are concerned that you have suffered a radiation induced injury.    All of the patient's questions were answered, patient is agreeable to proceed.  Consent signed and in chart.  Thank you for this interesting consult.  I greatly enjoyed meeting Ayana Imhof and look forward to participating in their care.  A copy of this report was sent to the requesting provider on this date.  Electronically Signed: Docia Barrier, PA 01/11/2021, 9:52 AM   I spent a total of  30 Minutes   in face to face in clinical consultation, greater than 50% of which was counseling/coordinating care for brain aneurysm.

## 2021-01-11 NOTE — Procedures (Signed)
S/P 4 vessel cerebral artrriogram RT rad approach  Findings. 1.Approx 6.41mm x 5.7 mm on AO and 7.31mmx 4.5 mm lat mildly lobulated Lt ICA terminus aneurysm. S.Maryem Shuffler MD

## 2021-01-15 ENCOUNTER — Telehealth: Payer: Self-pay | Admitting: Student

## 2021-01-15 ENCOUNTER — Other Ambulatory Visit (HOSPITAL_COMMUNITY): Payer: Self-pay | Admitting: Interventional Radiology

## 2021-01-15 DIAGNOSIS — I671 Cerebral aneurysm, nonruptured: Secondary | ICD-10-CM

## 2021-01-15 MED ORDER — CLOPIDOGREL BISULFATE 75 MG PO TABS: 75.0000 mg | ORAL_TABLET | Freq: Every day | ORAL | 3 refills | Status: DC

## 2021-01-15 NOTE — Telephone Encounter (Signed)
Plavix 75 mg daily x 30 days with 3 refills e-prescribed to [facility] in [place] per patient request.   Soyla Dryer, AGACNP-BC [phone] 01/15/2021, 1:44 PM

## 2021-01-25 ENCOUNTER — Telehealth (HOSPITAL_COMMUNITY): Payer: Self-pay | Admitting: Radiology

## 2021-01-25 NOTE — Telephone Encounter (Signed)
Pt was supposed to have a P2Y12 blood test today. She states she is not feeling well. She will come first thing Monday morning for the test. JM

## 2021-01-28 ENCOUNTER — Other Ambulatory Visit (HOSPITAL_COMMUNITY)
Admission: RE | Admit: 2021-01-28 | Discharge: 2021-01-28 | Disposition: A | Discharge status: Home / Self Care | Payer: Medicare PPO | Source: Ambulatory Visit | Attending: Interventional Radiology | Admitting: Interventional Radiology

## 2021-01-28 ENCOUNTER — Other Ambulatory Visit (HOSPITAL_COMMUNITY): Payer: Self-pay

## 2021-01-28 DIAGNOSIS — I729 Aneurysm of unspecified site: Secondary | ICD-10-CM

## 2021-01-29 ENCOUNTER — Other Ambulatory Visit (HOSPITAL_COMMUNITY): Payer: Self-pay | Admitting: Interventional Radiology

## 2021-01-30 ENCOUNTER — Encounter (HOSPITAL_COMMUNITY): Payer: Self-pay | Admitting: Interventional Radiology

## 2021-01-30 ENCOUNTER — Encounter (HOSPITAL_COMMUNITY): Payer: Self-pay | Admitting: Physician Assistant

## 2021-01-30 ENCOUNTER — Other Ambulatory Visit: Payer: Self-pay | Admitting: Radiology

## 2021-01-30 ENCOUNTER — Other Ambulatory Visit: Payer: Self-pay

## 2021-01-30 LAB — SARS CORONAVIRUS 2 (TAT 6-24 HRS): SARS Coronavirus 2: NEGATIVE

## 2021-01-30 NOTE — Progress Notes (Signed)
PCP - Dr. Ralene Cork  Cardiologist - Dr. Ashok Norris  EP-Denies  Endocrine-Denies  Pulm-Dr. Tomi Likens  Chest x-ray - 09/27/20 (E)  EKG - 09/27/20 (E)  Stress Test - 02/13/15 (E)  ECHO - 08/28/20 (E)  Cardiac Cath - 02/15/15 (E)  AICD-na PM-na LOOP-na  Dialysis-Denies  Sleep Study - Yes- Negative CPAP - Denies  LABS-01/11/21 (E): CBC, BMP, PT, PTT 01/29/21 (E): P2Y12 (pending), COVID- Neg  ASA-Continue Plavix- Continue  ERAS- No  HA1C- 06/13/15 (E): 8.3 Fasting Blood Sugar - 130-290 Checks Blood Sugar ___2__ times a day  Anesthesia- Yes, elevated A1C  Pt denies having chest pain, sob, or fever during the pre-op phone call. All instructions explained to the pt, with a verbal understanding of the material including: as of today, stop taking all Aspirin (unless instructed by your doctor) and Other Aspirin containing products, Vitamins, Fish oils, and Herbal medications. Also stop all NSAIDS i.e. Advil, Ibuprofen, Motrin, Aleve, Anaprox, Naproxen, BC, Goody Powders, and all Supplements.   WHAT DO I DO ABOUT MY DIABETES MEDICATION?  Do not take Glimepiride(Amaryl) and Glucophage(Metformin) the morning of surgery. (dulaglutide).  If your CBG is greater than 220 mg/dL, inform the staff upon arrival to Short Stay.    How do I manage my blood sugar before surgery? Check your blood sugar the morning of your surgery when you wake up and every 2 hours until you get to the Short Stay unit. If your blood sugar is less than 70 mg/dL, you will need to treat for low blood sugar: Do not take insulin. Treat a low blood sugar (less than 70 mg/dL) with  cup of clear juice (cranberry or apple), 4 glucose tablets, OR glucose gel. Recheck blood sugar in 15 minutes after treatment (to make sure it is greater than 70 mg/dL). If your blood sugar is not greater than 70 mg/dL on recheck, call [phone]  for further instructions. Report your blood sugar to the short stay nurse when you get to Short  Stay.  Reviewed and Endorsed by [facility] Patient Education Committee, August 2015  Pt also instructed to wear a mask and social distance after being tested for COVID-19. The opportunity to ask questions was provided.   Coronavirus Screening  Have you experienced the following symptoms:  Cough yes/no: No Fever (>100.2F)  yes/no: No Runny nose yes/no: No Sore throat yes/no: No Difficulty breathing/shortness of breath  yes/no: No  Have you or a family member traveled in the last 14 days and where? yes/no: No   If the patient indicates "YES" to the above questions, their PAT will be rescheduled to limit the exposure to others and, the surgeon will be notified. THE PATIENT WILL NEED TO BE ASYMPTOMATIC FOR 14 DAYS.   If the patient is not experiencing any of these symptoms, the PAT nurse will instruct them to NOT bring anyone with them to their appointment since they may have these symptoms or traveled as well.   Please remind your patients and families that hospital visitation restrictions are in effect and the importance of the restrictions.

## 2021-01-30 NOTE — Progress Notes (Signed)
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  She also has history of postop A. fib occurring after appendectomy with no further episodes.  She is not anticoagulated since A. fib occurred in postop setting with no recurrence.  She is maintained on ASA 81 mg, beta-blocker, statin.  Last seen by Dr. Radford Pax 05/03/2020 and stable at that time from cardiac standpoint.  Patient has chronic DOE from her COPD and is on supplemental O2 at night.  History of DM2, last A1c in epic is quite remote, 8.3 on 06/13/2015.  Most recent CBG 276 on 01/11/2021.  Patient will need day of surgery labs and evaluation.  EKG 09/27/2020: Sinus rhythm.  Rate 83.  PFTs 08/04/2019: FVC-%Pred-Pre Latest Units: % 74  FEV1-%Pred-Pre Latest Units: % 62  FEV1FVC-%Pred-Pre Latest Units: % 85   TTE 08/28/2020:  1. Left ventricular ejection fraction, by estimation, is 60 to 65%. The  left ventricle has normal function. The left ventricle has no regional  wall motion abnormalities. There is mild left ventricular hypertrophy of  the basal-septal segment. Left  ventricular diastolic parameters are consistent with Grade I diastolic  dysfunction (impaired relaxation). Elevated left atrial pressure.   2. Right ventricular systolic function is normal. The right ventricular  size is normal.   3. The mitral valve is normal in structure. No evidence of mitral valve  regurgitation. No evidence of mitral stenosis.   4. The aortic valve is tricuspid. Aortic valve regurgitation is not  visualized. Mild aortic valve sclerosis is present, with no evidence of  aortic valve stenosis.   5. The inferior vena cava is normal in size with greater than 50%  respiratory variability, suggesting right atrial pressure of 3 mmHg.   Cath 02/15/2015: Angiographically normal left coronary system with moderate disease in the proximal RCA Prox RCA lesion, 55% stenosed. The left  ventricular systolic function is normal. Normal LVEDP   Wynonia Musty [facility] Short Stay Center/Anesthesiology Phone [phone] 01/30/2021 11:47 AM

## 2021-01-30 NOTE — Anesthesia Preprocedure Evaluation (Addendum)
Anesthesia Evaluation  Patient identified by MRN, date of birth, ID band Patient awake    Reviewed: Allergy & Precautions, NPO status , Patient's Chart, lab work & pertinent test results, reviewed documented beta blocker date and time   History of Anesthesia Complications Negative for: history of anesthetic complications  Airway Mallampati: I  TM Distance: >3 FB Neck ROM: Full    Dental  (+) Edentulous Upper, Dental Advisory Given,    Pulmonary neg shortness of breath, COPD,  COPD inhaler, neg recent URI, Current Smoker and Patient abstained from smoking., former smoker,    breath sounds clear to auscultation       Cardiovascular + CAD   Rhythm:Regular  ? Angiographically normal left coronary system with moderate disease in the proximal RCA ? Prox RCA lesion, 55% stenosed. ? The left ventricular systolic function is normal. ? Normal LVEDP    Thankfully the patient does not have any significant coronary artery disease explain the abnormal stress test.  Would continue aggressive risk factor modification with moderate RCA disease. Would treat this medically with statin, beta blocker and aspirin.  The patient will be transferred back to [facility] following TR band removal.  I have discussed with Dr. Fransico Him, she feels that the patient would likely be ready for discharge from a cardiac standpoint upon returning to [facility].    Neuro/Psych PSYCHIATRIC DISORDERS Anxiety Depression Bipolar Disorder    GI/Hepatic negative GI ROS, Neg liver ROS,   Endo/Other  diabetes, Type 2Lab Results      Component                Value               Date                      HGBA1C                   8.3 (H)             06/13/2015             Renal/GU negative Renal ROS     Musculoskeletal   Abdominal   Peds  Hematology  (+) Blood dyscrasia, anemia , Lab Results      Component                Value                Date                      WBC                      13.3 (H)            01/31/2021                HGB                      11.9 (L)            01/31/2021                HCT                      38.1                01/31/2021  MCV                      86.8                01/31/2021                PLT                      358                 01/31/2021              Anesthesia Other Findings H/o postop afib  Reproductive/Obstetrics                            Anesthesia Physical Anesthesia Plan  ASA: 2  Anesthesia Plan: General   Post-op Pain Management:    Induction: Intravenous  PONV Risk Score and Plan: 3 and Ondansetron and Dexamethasone  Airway Management Planned: Oral ETT  Additional Equipment: Arterial line  Intra-op Plan:   Post-operative Plan: Extubation in OR and Possible Post-op intubation/ventilation  Informed Consent: I have reviewed the patients History and Physical, chart, labs and discussed the procedure including the risks, benefits and alternatives for the proposed anesthesia with the patient or authorized representative who has indicated his/her understanding and acceptance.     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  She also has history of postop A. fib occurring after appendectomy with no further episodes.  She is not anticoagulated since A. fib occurred in postop setting with no recurrence.  She is maintained on ASA 81 mg, beta-blocker, statin.  Last seen by Dr. Radford Pax 05/03/2020 and stable at that time from cardiac standpoint.  Patient has chronic DOE from her COPD and is on supplemental O2 at night.  History of DM2, last A1c in epic is quite remote, 8.3 on 06/13/2015.  Most recent CBG 276 on 01/11/2021.  Patient will need day of surgery labs  and evaluation.  EKG 09/27/2020: Sinus rhythm.  Rate 83.  PFTs 08/04/2019: FVC-%Pred-Pre Latest Units: % 74 FEV1-%Pred-Pre Latest Units: % 62 FEV1FVC-%Pred-Pre Latest Units: % 85  TTE 08/28/2020: 1. Left ventricular ejection fraction, by estimation, is 60 to 65%. The  left ventricle has normal function. The left ventricle has no regional  wall motion abnormalities. There is mild left ventricular hypertrophy of  the basal-septal segment. Left  ventricular diastolic parameters are consistent with Grade I diastolic  dysfunction (impaired relaxation). Elevated left atrial pressure.  2. Right ventricular systolic function is normal. The right ventricular  size is normal.  3. The mitral valve is normal in structure. No evidence of mitral valve  regurgitation. No evidence of mitral stenosis.  4. The aortic valve is tricuspid. Aortic valve regurgitation is not  visualized. Mild aortic valve sclerosis is present, with no evidence of  aortic valve stenosis.  5. The inferior vena cava is normal in size with greater than 50%  respiratory variability, suggesting right atrial pressure of 3 mmHg.   Cath 02/15/2015: . Angiographically normal left coronary system with moderate disease in the proximal RCA . Prox RCA lesion, 55% stenosed. . The left ventricular systolic function is normal. . Normal LVEDP  )       Anesthesia Quick Evaluation

## 2021-01-31 ENCOUNTER — Inpatient Hospital Stay (HOSPITAL_COMMUNITY)
Admission: RE | Admit: 2021-01-31 | Discharge: 2021-02-01 | DRG: 027 | Disposition: A | Discharge status: Home / Self Care | Payer: Medicare PPO | Attending: Interventional Radiology | Admitting: Interventional Radiology

## 2021-01-31 ENCOUNTER — Inpatient Hospital Stay (HOSPITAL_COMMUNITY)
Admission: RE | Admit: 2021-01-31 | Discharge: 2021-01-31 | Disposition: A | Discharge status: Home / Self Care | Payer: Medicare PPO | Source: Ambulatory Visit | Attending: Interventional Radiology | Admitting: Interventional Radiology

## 2021-01-31 ENCOUNTER — Encounter (HOSPITAL_COMMUNITY): Payer: Self-pay | Admitting: Interventional Radiology

## 2021-01-31 ENCOUNTER — Ambulatory Visit (HOSPITAL_COMMUNITY): Payer: Medicare PPO | Admitting: Physician Assistant

## 2021-01-31 ENCOUNTER — Encounter (HOSPITAL_COMMUNITY)
Admission: RE | Disposition: A | Discharge status: Home / Self Care | Payer: Self-pay | Source: Home / Self Care | Attending: Interventional Radiology

## 2021-01-31 DIAGNOSIS — Z7951 Long term (current) use of inhaled steroids: Secondary | ICD-10-CM

## 2021-01-31 DIAGNOSIS — J449 Chronic obstructive pulmonary disease, unspecified: Secondary | ICD-10-CM | POA: Diagnosis present

## 2021-01-31 DIAGNOSIS — I2510 Atherosclerotic heart disease of native coronary artery without angina pectoris: Secondary | ICD-10-CM | POA: Diagnosis present

## 2021-01-31 DIAGNOSIS — F419 Anxiety disorder, unspecified: Secondary | ICD-10-CM | POA: Diagnosis present

## 2021-01-31 DIAGNOSIS — Z79899 Other long term (current) drug therapy: Secondary | ICD-10-CM | POA: Diagnosis not present

## 2021-01-31 DIAGNOSIS — J309 Allergic rhinitis, unspecified: Secondary | ICD-10-CM | POA: Diagnosis present

## 2021-01-31 DIAGNOSIS — E1165 Type 2 diabetes mellitus with hyperglycemia: Secondary | ICD-10-CM | POA: Diagnosis present

## 2021-01-31 DIAGNOSIS — Z7984 Long term (current) use of oral hypoglycemic drugs: Secondary | ICD-10-CM | POA: Diagnosis not present

## 2021-01-31 DIAGNOSIS — E785 Hyperlipidemia, unspecified: Secondary | ICD-10-CM | POA: Diagnosis present

## 2021-01-31 DIAGNOSIS — Z7982 Long term (current) use of aspirin: Secondary | ICD-10-CM | POA: Diagnosis not present

## 2021-01-31 DIAGNOSIS — I10 Essential (primary) hypertension: Secondary | ICD-10-CM | POA: Diagnosis present

## 2021-01-31 DIAGNOSIS — F17210 Nicotine dependence, cigarettes, uncomplicated: Secondary | ICD-10-CM | POA: Diagnosis present

## 2021-01-31 DIAGNOSIS — Z7902 Long term (current) use of antithrombotics/antiplatelets: Secondary | ICD-10-CM

## 2021-01-31 DIAGNOSIS — F319 Bipolar disorder, unspecified: Secondary | ICD-10-CM | POA: Diagnosis present

## 2021-01-31 DIAGNOSIS — I671 Cerebral aneurysm, nonruptured: Principal | ICD-10-CM | POA: Diagnosis present

## 2021-01-31 HISTORY — PX: RADIOLOGY WITH ANESTHESIA: SHX6223

## 2021-01-31 HISTORY — PX: IR CT HEAD LTD: IMG2386

## 2021-01-31 HISTORY — PX: IR TRANSCATH/EMBOLIZ: IMG695

## 2021-01-31 HISTORY — DX: Depression, unspecified: F32.A

## 2021-01-31 HISTORY — DX: Anxiety disorder, unspecified: F41.9

## 2021-01-31 HISTORY — PX: IR US GUIDE VASC ACCESS RIGHT: IMG2390

## 2021-01-31 HISTORY — PX: IR 3D INDEPENDENT WKST: IMG2385

## 2021-01-31 LAB — BASIC METABOLIC PANEL
Anion gap: 11 (ref 5–15)
Anion gap: 8 (ref 5–15)
BUN: 25 mg/dL — ABNORMAL HIGH (ref 8–23)
BUN: 20 mg/dL (ref 8–23)
CO2: 21 mmol/L — ABNORMAL LOW (ref 22–32)
CO2: 19 mmol/L — ABNORMAL LOW (ref 22–32)
Calcium: 9 mg/dL (ref 8.9–10.3)
Calcium: 8.3 mg/dL — ABNORMAL LOW (ref 8.9–10.3)
Chloride: 104 mmol/L (ref 98–111)
Chloride: 108 mmol/L (ref 98–111)
Creatinine, Ser: 1.05 mg/dL — ABNORMAL HIGH (ref 0.44–1.00)
Creatinine, Ser: 1.25 mg/dL — ABNORMAL HIGH (ref 0.44–1.00)
GFR, Estimated: 56 mL/min — ABNORMAL LOW (ref >60)
GFR, Estimated: 45 mL/min — ABNORMAL LOW (ref >60)
Glucose, Bld: 169 mg/dL — ABNORMAL HIGH (ref 70–99)
Glucose, Bld: 480 mg/dL — ABNORMAL HIGH (ref 70–99)
Potassium: 4 mmol/L (ref 3.5–5.1)
Potassium: 4 mmol/L (ref 3.5–5.1)
Sodium: 136 mmol/L (ref 135–145)
Sodium: 135 mmol/L (ref 135–145)

## 2021-01-31 LAB — CBC WITH DIFFERENTIAL/PLATELET
Abs Immature Granulocytes: 0.09 10*3/uL — ABNORMAL HIGH (ref 0.00–0.07)
Basophils Absolute: 0.1 10*3/uL (ref 0.0–0.1)
Basophils Relative: 1 %
Eosinophils Absolute: 0.3 10*3/uL (ref 0.0–0.5)
Eosinophils Relative: 3 %
HCT: 38.1 % (ref 36.0–46.0)
Hemoglobin: 11.9 g/dL — ABNORMAL LOW (ref 12.0–15.0)
Immature Granulocytes: 1 %
Lymphocytes Relative: 14 %
Lymphs Abs: 1.9 10*3/uL (ref 0.7–4.0)
MCH: 27.1 pg (ref 26.0–34.0)
MCHC: 31.2 g/dL (ref 30.0–36.0)
MCV: 86.8 fL (ref 80.0–100.0)
Monocytes Absolute: 1.4 10*3/uL — ABNORMAL HIGH (ref 0.1–1.0)
Monocytes Relative: 10 %
Neutro Abs: 9.5 10*3/uL — ABNORMAL HIGH (ref 1.7–7.7)
Neutrophils Relative %: 71 %
Platelets: 358 10*3/uL (ref 150–400)
RBC: 4.39 MIL/uL (ref 3.87–5.11)
RDW: 17.5 % — ABNORMAL HIGH (ref 11.5–15.5)
WBC: 13.3 10*3/uL — ABNORMAL HIGH (ref 4.0–10.5)
nRBC: 0 % (ref 0.0–0.2)

## 2021-01-31 LAB — GLUCOSE, CAPILLARY
Glucose-Capillary: 186 mg/dL — ABNORMAL HIGH (ref 70–99)
Glucose-Capillary: 210 mg/dL — ABNORMAL HIGH (ref 70–99)
Glucose-Capillary: 503 mg/dL (ref 70–99)
Glucose-Capillary: 495 mg/dL — ABNORMAL HIGH (ref 70–99)
Glucose-Capillary: 500 mg/dL — ABNORMAL HIGH (ref 70–99)
Glucose-Capillary: 469 mg/dL — ABNORMAL HIGH (ref 70–99)
Glucose-Capillary: 431 mg/dL — ABNORMAL HIGH (ref 70–99)
Glucose-Capillary: 438 mg/dL — ABNORMAL HIGH (ref 70–99)
Glucose-Capillary: 337 mg/dL — ABNORMAL HIGH (ref 70–99)
Glucose-Capillary: 284 mg/dL — ABNORMAL HIGH (ref 70–99)
Glucose-Capillary: 241 mg/dL — ABNORMAL HIGH (ref 70–99)
Glucose-Capillary: 158 mg/dL — ABNORMAL HIGH (ref 70–99)

## 2021-01-31 LAB — PROTIME-INR
INR: 1 (ref 0.8–1.2)
Prothrombin Time: 12.9 seconds (ref 11.4–15.2)

## 2021-01-31 LAB — HEMOGLOBIN A1C
Hgb A1c MFr Bld: 8.6 % — ABNORMAL HIGH (ref 4.8–5.6)
Mean Plasma Glucose: 200.12 mg/dL

## 2021-01-31 LAB — TSH: TSH: 1.349 u[IU]/mL (ref 0.350–4.500)

## 2021-01-31 LAB — HEPARIN LEVEL (UNFRACTIONATED): Heparin Unfractionated: 0.41 IU/mL (ref 0.30–0.70)

## 2021-01-31 LAB — POCT ACTIVATED CLOTTING TIME
Activated Clotting Time: 214 seconds
Activated Clotting Time: 260 seconds

## 2021-01-31 SURGERY — IR WITH ANESTHESIA
Anesthesia: General

## 2021-01-31 MED ORDER — NITROGLYCERIN 1 MG/10 ML FOR IR/CATH LAB
INTRA_ARTERIAL | Status: DC | PRN
  Administered 2021-01-31 (×2): 200 ug via INTRA_ARTERIAL

## 2021-01-31 MED ORDER — INSULIN ASPART 100 UNIT/ML IJ SOLN
2.0000 [IU] | INTRAMUSCULAR | Status: DC
  Administered 2021-02-01 (×2): 2 [IU] via SUBCUTANEOUS

## 2021-01-31 MED ORDER — SODIUM CHLORIDE 0.9 % IV SOLN: INTRAVENOUS | Status: DC

## 2021-01-31 MED ORDER — DEXAMETHASONE SODIUM PHOSPHATE 10 MG/ML IJ SOLN
INTRAMUSCULAR | Status: DC | PRN
  Administered 2021-01-31: 4 mg via INTRAVENOUS

## 2021-01-31 MED ORDER — IOHEXOL 300 MG/ML  SOLN
100.0000 mL | Freq: Once | INTRAMUSCULAR | Status: AC | PRN
  Administered 2021-01-31: 66 mL via INTRAVENOUS

## 2021-01-31 MED ORDER — ROCURONIUM BROMIDE 10 MG/ML (PF) SYRINGE
PREFILLED_SYRINGE | INTRAVENOUS | Status: DC | PRN
  Administered 2021-01-31 (×2): 20 mg via INTRAVENOUS
  Administered 2021-01-31: 80 mg via INTRAVENOUS
  Administered 2021-01-31 (×2): 20 mg via INTRAVENOUS

## 2021-01-31 MED ORDER — FENTANYL CITRATE (PF) 100 MCG/2ML IJ SOLN: 25.0000 ug | INTRAMUSCULAR | Status: DC | PRN

## 2021-01-31 MED ORDER — LACTATED RINGERS IV SOLN: INTRAVENOUS | Status: DC

## 2021-01-31 MED ORDER — HEPARIN SODIUM (PORCINE) 1000 UNIT/ML IJ SOLN
INTRAMUSCULAR | Status: DC | PRN
  Administered 2021-01-31: 1000 [IU] via INTRAVENOUS
  Administered 2021-01-31: 3000 [IU] via INTRAVENOUS

## 2021-01-31 MED ORDER — ASPIRIN EC 325 MG PO TBEC: 325.0000 mg | DELAYED_RELEASE_TABLET | ORAL | Status: AC

## 2021-01-31 MED ORDER — FENTANYL CITRATE (PF) 250 MCG/5ML IJ SOLN
INTRAMUSCULAR | Status: DC | PRN
  Administered 2021-01-31: 100 ug via INTRAVENOUS

## 2021-01-31 MED ORDER — SODIUM CHLORIDE 0.9 % IV SOLN: INTRAVENOUS | Status: DC | PRN

## 2021-01-31 MED ORDER — NIMODIPINE 30 MG PO CAPS
0.0000 mg | ORAL_CAPSULE | ORAL | Status: DC
Start: 2021-01-31 — End: 2021-01-31

## 2021-01-31 MED ORDER — ACETAMINOPHEN 500 MG PO TABS: 1000.0000 mg | ORAL_TABLET | Freq: Once | ORAL | Status: DC | PRN

## 2021-01-31 MED ORDER — HEPARIN SODIUM (PORCINE) 1000 UNIT/ML IJ SOLN
INTRAMUSCULAR | Status: DC | PRN
  Administered 2021-01-31: 2000 [IU] via INTRAVENOUS

## 2021-01-31 MED ORDER — METOPROLOL TARTRATE 25 MG PO TABS
25.0000 mg | ORAL_TABLET | Freq: Two times a day (BID) | ORAL | Status: DC
  Administered 2021-02-01: 25 mg via ORAL
  Filled 2021-01-31: qty 1

## 2021-01-31 MED ORDER — GLYCOPYRROLATE 0.2 MG/ML IJ SOLN
INTRAMUSCULAR | Status: DC | PRN
  Administered 2021-01-31: .1 mg via INTRAVENOUS

## 2021-01-31 MED ORDER — VANCOMYCIN HCL IN DEXTROSE 1-5 GM/200ML-% IV SOLN: 1000.0000 mg | INTRAVENOUS | Status: AC

## 2021-01-31 MED ORDER — NITROGLYCERIN 1 MG/10 ML FOR IR/CATH LAB
INTRA_ARTERIAL | Status: AC
  Filled 2021-01-31: qty 10

## 2021-01-31 MED ORDER — CHLORHEXIDINE GLUCONATE 0.12 % MT SOLN
15.0000 mL | Freq: Once | OROMUCOSAL | Status: AC
  Administered 2021-01-31: 15 mL via OROMUCOSAL
  Filled 2021-01-31: qty 15

## 2021-01-31 MED ORDER — REMIFENTANIL HCL 2 MG IV SOLR
INTRAVENOUS | Status: AC
  Filled 2021-01-31: qty 2000

## 2021-01-31 MED ORDER — ALBUTEROL SULFATE (2.5 MG/3ML) 0.083% IN NEBU: 3.0000 mL | INHALATION_SOLUTION | Freq: Four times a day (QID) | RESPIRATORY_TRACT | Status: DC | PRN

## 2021-01-31 MED ORDER — ACETAMINOPHEN 650 MG RE SUPP: 650.0000 mg | RECTAL | Status: DC | PRN

## 2021-01-31 MED ORDER — ATORVASTATIN CALCIUM 40 MG PO TABS
40.0000 mg | ORAL_TABLET | Freq: Every day | ORAL | Status: DC
  Administered 2021-02-01: 40 mg via ORAL
  Filled 2021-01-31: qty 4

## 2021-01-31 MED ORDER — DEXTROSE 50 % IV SOLN: 0.0000 mL | INTRAVENOUS | Status: DC | PRN

## 2021-01-31 MED ORDER — ACETAMINOPHEN 10 MG/ML IV SOLN: 1000.0000 mg | Freq: Once | INTRAVENOUS | Status: DC | PRN

## 2021-01-31 MED ORDER — ORAL CARE MOUTH RINSE: 15.0000 mL | Freq: Once | OROMUCOSAL | Status: AC

## 2021-01-31 MED ORDER — LIDOCAINE HCL 1 % IJ SOLN
INTRAMUSCULAR | Status: AC
  Administered 2021-01-31: 5 mL
  Filled 2021-01-31: qty 20

## 2021-01-31 MED ORDER — VANCOMYCIN HCL 1000 MG IV SOLR: 1000.0000 mg | INTRAVENOUS | Status: DC

## 2021-01-31 MED ORDER — ACETAMINOPHEN 160 MG/5ML PO SOLN: 1000.0000 mg | Freq: Once | ORAL | Status: DC | PRN

## 2021-01-31 MED ORDER — ASPIRIN EC 81 MG PO TBEC
DELAYED_RELEASE_TABLET | ORAL | Status: AC
  Administered 2021-01-31: 243 mg via ORAL
  Filled 2021-01-31: qty 3

## 2021-01-31 MED ORDER — EPTIFIBATIDE 20 MG/10ML IV SOLN
INTRAVENOUS | Status: DC | PRN
  Administered 2021-01-31 (×4): 1.5 mg via INTRA_ARTERIAL

## 2021-01-31 MED ORDER — PROPOFOL 10 MG/ML IV BOLUS
INTRAVENOUS | Status: DC | PRN
  Administered 2021-01-31: 60 mg via INTRAVENOUS
  Administered 2021-01-31: 30 mg via INTRAVENOUS

## 2021-01-31 MED ORDER — OXYCODONE HCL 5 MG PO TABS: 5.0000 mg | ORAL_TABLET | Freq: Once | ORAL | Status: DC | PRN

## 2021-01-31 MED ORDER — INSULIN ASPART 100 UNIT/ML IJ SOLN: 0.0000 [IU] | Freq: Three times a day (TID) | INTRAMUSCULAR | Status: DC

## 2021-01-31 MED ORDER — INSULIN DETEMIR 100 UNIT/ML ~~LOC~~ SOLN
10.0000 [IU] | Freq: Two times a day (BID) | SUBCUTANEOUS | Status: DC
  Administered 2021-02-01: 10 [IU] via SUBCUTANEOUS
  Filled 2021-01-31 (×3): qty 0.1

## 2021-01-31 MED ORDER — ONDANSETRON HCL 4 MG/2ML IJ SOLN
INTRAMUSCULAR | Status: DC | PRN
Start: 2021-01-31 — End: 2021-01-31
  Administered 2021-01-31: 4 mg via INTRAVENOUS

## 2021-01-31 MED ORDER — VERAPAMIL HCL 2.5 MG/ML IV SOLN
INTRAVENOUS | Status: AC
  Filled 2021-01-31: qty 2

## 2021-01-31 MED ORDER — VANCOMYCIN HCL IN DEXTROSE 1-5 GM/200ML-% IV SOLN
INTRAVENOUS | Status: AC
  Administered 2021-01-31: 1000 mg via INTRAVENOUS
  Filled 2021-01-31: qty 200

## 2021-01-31 MED ORDER — ACETAMINOPHEN 325 MG PO TABS: 650.0000 mg | ORAL_TABLET | ORAL | Status: DC | PRN

## 2021-01-31 MED ORDER — VERAPAMIL HCL 2.5 MG/ML IV SOLN
INTRAVENOUS | Status: DC | PRN
  Administered 2021-01-31: 2.5 mg via INTRA_ARTERIAL

## 2021-01-31 MED ORDER — ASPIRIN 325 MG PO TABS
325.0000 mg | ORAL_TABLET | Freq: Every day | ORAL | Status: DC
  Administered 2021-02-01: 325 mg via ORAL
  Filled 2021-01-31: qty 1

## 2021-01-31 MED ORDER — ACETAMINOPHEN 160 MG/5ML PO SOLN: 650.0000 mg | ORAL | Status: DC | PRN

## 2021-01-31 MED ORDER — ASPIRIN 81 MG PO CHEW: 324.0000 mg | CHEWABLE_TABLET | Freq: Every day | ORAL | Status: DC

## 2021-01-31 MED ORDER — PHENYLEPHRINE HCL-NACL 20-0.9 MG/250ML-% IV SOLN
INTRAVENOUS | Status: DC | PRN
  Administered 2021-01-31: 20 ug/min via INTRAVENOUS

## 2021-01-31 MED ORDER — HEPARIN SODIUM (PORCINE) 1000 UNIT/ML IJ SOLN
INTRAMUSCULAR | Status: AC
  Filled 2021-01-31: qty 1

## 2021-01-31 MED ORDER — HEPARIN (PORCINE) 25000 UT/250ML-% IV SOLN
500.0000 [IU]/h | INTRAVENOUS | Status: DC
  Filled 2021-01-31: qty 250

## 2021-01-31 MED ORDER — HEPARIN (PORCINE) 25000 UT/250ML-% IV SOLN
500.0000 [IU]/h | INTRAVENOUS | Status: DC
  Administered 2021-01-31: 500 [IU]/h via INTRAVENOUS

## 2021-01-31 MED ORDER — CLOPIDOGREL BISULFATE 75 MG PO TABS: 75.0000 mg | ORAL_TABLET | Freq: Every day | ORAL | Status: DC

## 2021-01-31 MED ORDER — CLOPIDOGREL BISULFATE 75 MG PO TABS
75.0000 mg | ORAL_TABLET | Freq: Every day | ORAL | Status: DC
  Administered 2021-02-01: 75 mg via ORAL
  Filled 2021-01-31: qty 1

## 2021-01-31 MED ORDER — CLOPIDOGREL BISULFATE 75 MG PO TABS
75.0000 mg | ORAL_TABLET | ORAL | Status: AC
  Administered 2021-01-31: 75 mg via ORAL
  Filled 2021-01-31: qty 1

## 2021-01-31 MED ORDER — SUGAMMADEX SODIUM 200 MG/2ML IV SOLN
INTRAVENOUS | Status: DC | PRN
  Administered 2021-01-31: 200 mg via INTRAVENOUS

## 2021-01-31 MED ORDER — SODIUM CHLORIDE 0.9 % IV SOLN
INTRAVENOUS | Status: DC | PRN
  Administered 2021-01-31: .15 ug/kg/min via INTRAVENOUS
  Administered 2021-01-31: .2 ug/kg/min via INTRAVENOUS

## 2021-01-31 MED ORDER — HEPARIN (PORCINE) 25000 UT/250ML-% IV SOLN
INTRAVENOUS | Status: AC
  Filled 2021-01-31: qty 250

## 2021-01-31 MED ORDER — INSULIN REGULAR(HUMAN) IN NACL 100-0.9 UT/100ML-% IV SOLN
INTRAVENOUS | Status: DC
  Administered 2021-01-31: 14 [IU]/h via INTRAVENOUS
  Administered 2021-01-31: 10 [IU]/h via INTRAVENOUS
  Filled 2021-01-31 (×2): qty 100

## 2021-01-31 MED ORDER — CLEVIDIPINE BUTYRATE 0.5 MG/ML IV EMUL: 0.0000 mg/h | INTRAVENOUS | Status: DC

## 2021-01-31 MED ORDER — EPTIFIBATIDE 20 MG/10ML IV SOLN
INTRAVENOUS | Status: AC
  Filled 2021-01-31: qty 10

## 2021-01-31 MED ORDER — CLONAZEPAM 1 MG PO TABS
1.0000 mg | ORAL_TABLET | Freq: Two times a day (BID) | ORAL | Status: DC | PRN
  Administered 2021-02-01: 1 mg via ORAL
  Filled 2021-01-31: qty 1

## 2021-01-31 MED ORDER — HEPARIN (PORCINE) 25000 UT/250ML-% IV SOLN
600.0000 [IU]/h | INTRAVENOUS | Status: DC
  Administered 2021-01-31: 600 [IU]/h via INTRAVENOUS
  Filled 2021-01-31: qty 250

## 2021-01-31 MED ORDER — OXYCODONE HCL 5 MG/5ML PO SOLN
5.0000 mg | Freq: Once | ORAL | Status: DC | PRN
Start: 2021-01-31 — End: 2021-01-31

## 2021-01-31 MED ORDER — SODIUM CHLORIDE 0.9 % IV SOLN
INTRAVENOUS | Status: AC
  Filled 2021-01-31: qty 100

## 2021-01-31 NOTE — Anesthesia Procedure Notes (Signed)
Procedure Name: Intubation Date/Time: 01/31/2021 9:05 AM Performed by: Thelma Comp, CRNA Pre-anesthesia Checklist: Patient identified, Emergency Drugs available, Suction available and Patient being monitored Patient Re-evaluated:Patient Re-evaluated prior to induction Oxygen Delivery Method: Circle System Utilized Preoxygenation: Pre-oxygenation with 100% oxygen Induction Type: IV induction Ventilation: Mask ventilation without difficulty Laryngoscope Size: Mac and 3 Grade View: Grade I Tube type: Oral Tube size: 7.0 mm Number of attempts: 1 Airway Equipment and Method: Stylet and Oral airway Placement Confirmation: ETT inserted through vocal cords under direct vision, positive ETCO2 and breath sounds checked- equal and bilateral Secured at: 22 cm Tube secured with: Tape Dental Injury: Teeth and Oropharynx as per pre-operative assessment

## 2021-01-31 NOTE — Procedures (Signed)
S/P Lt common  and Lt Internal carotid arteriograms followed by embolization of Lt ICA terminus aneurysm with WEB 6 x 4 SL device with hemostasis. Post procedure  CT brain No ICH . Extubated . Denies any H/As,N/V or visual or speech difficulties. Oriented to space and place. Pupils 36mm  to 3 mm RT = LT. No facial asymmetyry. Moves all4s equally.  RT Distal radial pulse present. S.Alecia Doi MD

## 2021-01-31 NOTE — Consult Note (Addendum)
NAME:  Mercedes Thornton, MRN:  [id number], DOB:  18-May-1945, LOS: 0 ADMISSION DATE:  01/31/2021 CONSULTATION DATE:  01/31/2021 REFERRING MD:  Genevie Cheshire CHIEF COMPLAINT:  Hyperglycemia   History of Present Illness:  75 year old woman who presented to [facility] 10/20 for planned embolization of L ICA aneurysm. PMHx significant for HTN, HLD, CAD, Afib, T2DM (A1C 8.6% 01/2021), COPD, tobacco abuse, bipolar disorder, depression/anxiety.  Patient was initially seen by Dr. 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 She was referred to Dr. Estanislado Pandy for intervention.  Patient underwent embolization of L ICA terminus aneurysm 10/20 with NIR. Procedure was uncomplicated and patient tolerated procedure well. Post-procedure CT Head was negative for ICH. However, patient was noted to be profoundly hyperglycemic to > 500.  PCCM consulted for management of hyperglycemia.  Pertinent Medical History:   Past Medical History:  Diagnosis Date   Allergic rhinitis    Anxiety    Bipolar affective disorder ([place])    COPD (chronic obstructive pulmonary disease) ([place])    Coronary artery disease 02/13/2015   50% RCA otherwise normal   Cough 10/30/2011   Depression    DM (diabetes mellitus) ([facility])    Type II   Hyperlipidemia    Leukocytosis    Mediastinal adenopathy 11/26/2011   Pericardial effusion    Postoperative atrial fibrillation ([facility])    Smoking    SOB (shortness of breath) 10/30/2011   Significant Hospital Events: Including procedures, antibiotic start and stop dates in addition to other pertinent events   10/20 - Admitted for L ICA aneurysm embolization with Dr. Estanislado Pandy. Tolerated procedure well. CBG > 500 post-procedure. A1c 8.6%. PCCM consulted for management. Insulin gtt started.  Interim History / Subjective:  PCCM consulted for uncontrolled hyperglycemia  Objective:  Blood  pressure 112/79, pulse 77, temperature 98 F (36.7 C), temperature source Oral, resp. rate (!) 22, height 5\' 4"  (1.626 m), weight 68 kg, SpO2 91 %.        Intake/Output Summary (Last 24 hours) at 01/31/2021 1654 Last data filed at 01/31/2021 1400 Gross per 24 hour  Intake 705.31 ml  Output 1150 ml  Net -444.69 ml   Filed Weights   01/30/21 1302 01/31/21 0627  Weight: 68 kg 68 kg   Physical Examination: General: WDWN elderly woman in NAD. Pleasant and conversant. HEENT: [place]/AT, anicteric sclera, PERRL, moist mucous membranes. Neuro: Awake, oriented x 4. Responds to verbal stimuli. Following commands consistently. Moves all 4 extremities spontaneously. Strength 4/5 in all 4 extremities. CV: RRR, no m/g/r. PULM: Breathing even and unlabored on 2L [place]. Lung fields CTAB. GI: Soft, nontender, nondistended. Normoactive bowel sounds. Extremities: No LE edema noted. Skin: Warm/dry, no rashes.  Resolved Hospital Problem List     Assessment & Plan:   Hyperglycemia History of T2DM, uncontrolled Patient with marked hyperglycemia post-procedure to > 500. Evidence of uncontrolled T2DM with Hemoglobin A1C 8.6% 10/20 (previously 8.3% 06/2015, per daughters at bedside had a more recent A1c that was 6.5%). Home regimen includes metformin and glimepiride. Patient states these medications were stopped by her PCP during her paralysis workup, which doesn't clinically make sense. Likely needs discussion and adjustment of home medications. - Insulin gtt - Endotool for titration - CBGs Q1H on gtt - Consult to Diabetes Management - Diabetic/Carb-Controlled diet - Plan to reach out to patient's PCP regarding rationale for DM regimen discontinuation  L ICA terminus aneurysm, s/p embolization - Post-procedure management  per NIR - Elevate RUE x 24H - Goal SBP < 140 - Cleviprex titrated to SBP goal - Heparin gtt - Plavix + ASA 10/21 at 1000 per NIR  COPD Tobacco abuse Allergic rhinitis - Continue  home Trelegy (or equivalent) - DuoNeb/Albuterol PRN - Pulmonary hygiene - Encourage smoking cessation  CAD HLD - Resume home atorvastatin, metoprolol  Bipolar disorder Depression Anxiety - Continue home Klonopin PRN  Best Practice: (right click and "Reselect all SmartList Selections" daily)   Diet/type: Regular consistency (see orders) - Diabetic/Carb Controlled Diet DVT prophylaxis: systemic heparin - per NIR GI prophylaxis: N/A Lines: Arterial Line Foley:  Yes, and it is no longer needed Code Status:  full code Last date of multidisciplinary goals of care discussion [Per Primary]  Labs:  CBC: Recent Labs  Lab 01/31/21 0627  WBC 13.3*  NEUTROABS 9.5*  HGB 11.9*  HCT 38.1  MCV 86.8  PLT 417   Basic Metabolic Panel: Recent Labs  Lab 01/31/21 0627  NA 136  K 4.0  CL 104  CO2 21*  GLUCOSE 169*  BUN 25*  CREATININE 1.05*  CALCIUM 9.0   GFR: Estimated Creatinine Clearance: 44.5 mL/min (A) (by C-G formula based on SCr of 1.05 mg/dL (H)). Recent Labs  Lab 01/31/21 0627  WBC 13.3*   Liver Function Tests: No results for input(s): AST, ALT, ALKPHOS, BILITOT, PROT, ALBUMIN in the last 168 hours. No results for input(s): LIPASE, AMYLASE in the last 168 hours. No results for input(s): AMMONIA in the last 168 hours.  ABG:    Component Value Date/Time   PHART 7.442 02/01/2015 1525   PCO2ART 33.9 (L) 02/01/2015 1525   PO2ART 89.2 02/01/2015 1525   HCO3 22.8 02/01/2015 1525   TCO2 18 (L) 09/27/2020 1634   ACIDBASEDEF 0.4 02/01/2015 1525   O2SAT 96.8 02/01/2015 1525   Coagulation Profile: Recent Labs  Lab 01/31/21 0627  INR 1.0   Cardiac Enzymes: No results for input(s): CKTOTAL, CKMB, CKMBINDEX, TROPONINI in the last 168 hours.  HbA1C: Hgb A1c MFr Bld  Date/Time Value Ref Range Status  06/13/2015 11:02 AM 8.3 (H) <5.7 % Final    Comment:                                                                           According to the ADA Clinical  Practice Recommendations for 2011, when HbA1c is used as a screening test:     >=6.5%   Diagnostic of Diabetes Mellitus            (if abnormal result is confirmed)   5.7-6.4%   Increased risk of developing Diabetes Mellitus   References:Diagnosis and Classification of Diabetes Mellitus,Diabetes EYCX,4481,85(UDJSH 1):S62-S69 and Standards of Medical Care in         Diabetes - 2011,Diabetes [id number],3785,88 (Suppl 1):S11-S61.     02/01/2015 03:55 AM 7.5 (H) 4.8 - 5.6 % Final    Comment:    (NOTE)         Pre-diabetes: 5.7 - 6.4         Diabetes: >6.4         Glycemic control for adults with diabetes: <7.0    CBG: Recent Labs  Lab 01/31/21 0631 01/31/21 1243  01/31/21 1622 01/31/21 1634  GLUCAP 186* 210* 503* 500*   Review of Systems:   Review of systems completed with pertinent positives/negatives outlined in above HPI.  Past Medical History:  She,  has a past medical history of Allergic rhinitis, Anxiety, Bipolar affective disorder ([place]), COPD (chronic obstructive pulmonary disease) ([place]), Coronary artery disease (02/13/2015), Cough (10/30/2011), Depression, DM (diabetes mellitus) ([place]), Hyperlipidemia, Leukocytosis, Mediastinal adenopathy (11/26/2011), Pericardial effusion, Postoperative atrial fibrillation ([place]), Smoking, and SOB (shortness of breath) (10/30/2011).   Surgical History:   Past Surgical History:  Procedure Laterality Date   CARDIAC CATHETERIZATION N/A 02/15/2015   Procedure: Left Heart Cath and Coronary Angiography;  Surgeon: Leonie Man, MD;  Location: [facility];  Service: Cardiovascular;  Laterality: N/A;   CATARACT EXTRACTION     CESAREAN SECTION     COLONOSCOPY  2008   negative per pt's report, with [facility]    IR ANGIO INTRA EXTRACRAN SEL COM CAROTID INNOMINATE BILAT MOD SED  01/11/2021   IR ANGIO VERTEBRAL SEL VERTEBRAL BILAT MOD SED  01/11/2021   IR RADIOLOGIST EVAL & MGMT  01/08/2021   IR US GUIDE VASC ACCESS RIGHT  01/11/2021    LAPAROSCOPIC APPENDECTOMY N/A 01/31/2015   Procedure: LYSIS OF ADHESIONS [place] Form LAPAROSCOPIC ;  Surgeon: Alphonsa Overall, MD;  Location: [facility];  Service: General;  Laterality: N/A;    Social History:   reports that she has quit smoking. Her smoking use included cigarettes. She has a 25.50 pack-year smoking history. She has never used smokeless tobacco. She reports that she does not currently use alcohol. She reports that she does not use drugs.   Family History:  Her family history includes Cancer in her father; Stroke in her mother. There is no history of Breast cancer.   Allergies: Allergies  Allergen Reactions   Penicillins Hives and Swelling    Has patient had a PCN reaction causing immediate rash, facial/tongue/throat swelling, SOB or lightheadedness with hypotension: Yes Has patient had a PCN reaction causing severe rash involving mucus membranes or skin necrosis: Yes Has patient had a PCN reaction that required hospitalization Yes- hospital for 7 days Has patient had a PCN reaction occurring within the last 10 years: No If all of the above answers are "NO", then may proceed with Cephalosporin use.     Home Medications: Prior to Admission medications   Medication Sig Start Date End Date Taking? Authorizing Provider  aspirin 81 MG tablet Take 81 mg by mouth daily.   Yes [provider]  atorvastatin (LIPITOR) 40 MG tablet Take 1 tablet (40 mg total) by mouth daily. 05/04/20  Yes Turner, Eber Hong, MD  Cholecalciferol (VITAMIN D3) 3000 UNITS TABS Take 3,000 Units by mouth daily.   Yes [provider]  CINNAMON PO Take 2,000 mg by mouth daily.   Yes [provider]  clonazePAM (KLONOPIN) 1 MG tablet Take 1 mg by mouth See admin instructions. Take 1mg  every AM then twice a day as needed for anxiety. 12/11/14  Yes [provider]  clopidogrel (PLAVIX) 75 MG tablet Take 1 tablet (75 mg total) by mouth daily. 01/15/21 05/15/21 Yes Covington, Ardeth Perfect, NP   fexofenadine (ALLEGRA) 180 MG tablet Take 180 mg by mouth daily.   Yes [provider]  Fluticasone-Umeclidin-Vilant (TRELEGY ELLIPTA) 100-62.5-25 MCG/INH AEPB TAKE 1 PUFF BY MOUTH EVERY DAY 07/19/20  Yes Hunsucker, Bonna Gains, MD  glimepiride (AMARYL) 4 MG tablet Take 4 mg by mouth daily with breakfast. 01/23/15  Yes  [provider]  metFORMIN (GLUCOPHAGE) 500 MG tablet Take 500 mg by mouth daily with breakfast.   Yes [provider]  metoprolol tartrate (LOPRESSOR) 25 MG tablet TAKE 1 TABLET BY MOUTH TWICE DAILY. 05/15/20  Yes Turner, Eber Hong, MD  Multiple Vitamins-Minerals (WOMENS MULTIVITAMIN PLUS PO) Take 1 tablet by mouth daily.    Yes [provider]  Polyethyl Glycol-Propyl Glycol (SYSTANE OP) Apply 1 drop to eye every 6 (six) hours as needed (dry eyes).   Yes [provider]  vitamin C (ASCORBIC ACID) 500 MG tablet Take 500 mg by mouth daily.   Yes [provider]  albuterol (VENTOLIN HFA) 108 (90 Base) MCG/ACT inhaler Inhale 2 puffs into the lungs every 6 (six) hours as needed for wheezing. 04/27/19   Magdalen Spatz, NP  fluticasone (FLONASE) 50 MCG/ACT nasal spray Place 1 spray into both nostrils daily as needed for allergies.  08/02/13   [provider]    Critical care time: N/A   Lestine Mount, PA-C [facility] 01/31/21 4:54 PM  Please see [email or web address] for pager details.  From 7A-7P if no response, please call [phone] After hours, please call ELink [phone]

## 2021-01-31 NOTE — H&P (Addendum)
Chief Complaint: Patient was seen in consultation today for Left internal carotid artery aneurysm embolization at the request of Dr Liborio Nixon   Supervising Physician: Luanne Bras  Patient Status: [facility] - Out-pt  History of Present Illness: Mercedes Thornton is a 75 y.o. female   COPD; DM; CAD Unruptured L ICA aneurysm  To Dr Tomi Likens weeks ago secondary episode of transient paralysis- 4-5 hrs Could move her eyes---- nothing else Denies N/V Denies visual  changes Denies loss of bladder or bowel control Slow recovery after the few hours--- none since  Work up revealed incidental L ICA aneurysm MRA 12/20/20:  IMPRESSION: 1. No acute or subacute insult. 2. 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 3. 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  Referred to Dr Estanislado Pandy and consult was 01/04/21 The MRI MRA findings reviewed with patient. Patient reports no family history of ruptured or of unruptured intracranial aneurysms, or of abdominal aneurysms. Patient has been a heavy smoker, and is in the process of stopping. She reportedly smokes up to 5 cigarettes a day. She denies using any illicit chemicals. Increased risk of rupture related to hypertension, smoking, female gender, was discussed. Risk of rupture of intracranial aneurysm of 1-2% per year with attendant significant mortality and morbidity was also reviewed. Given the size of the aneurysm, of approximately 7 mm x 7 mm, management considerations were those of endovascular embolization of the intracranial aneurysm using either primary coiling, versus stent assisted coiling versus placement of intra saccular disruption device such as the WEB device. The procedure of endovascular treatment was discussed in detail with patient and daughter. Risks of 1%-2% chance of a thromboembolic stroke, and remote possibility of intracranial hemorrhage  with potential fatality was also reviewed in detail. The other option was that of continued medical management with surveillance using CT angiograms or MRAs.     The patient has expressed her desire to proceed with elimination of the aneurysm from the circulation with endovascular means. This will be scheduled as soon as possible. Patient was also advised the patient would have to be started on antiplatelets 7-10 days prior to the procedure.  Taking ASA/Plavix P2y12  160 10/18 per report from [place] Dr Estanislado Pandy aware   Scheduled now for Cerebral arteriogram with embolization of L ICA aneurysm  Past Medical History:  Diagnosis Date   Allergic rhinitis    Anxiety    Bipolar affective disorder ([place])    COPD (chronic obstructive pulmonary disease) ([place])    Coronary artery disease 02/13/2015   50% RCA otherwise normal   Cough 10/30/2011   Depression    DM (diabetes mellitus) ([place])    Type II   Hyperlipidemia    Leukocytosis    Mediastinal adenopathy 11/26/2011   Pericardial effusion    Postoperative atrial fibrillation (HCC)    Smoking    SOB (shortness of breath) 10/30/2011    Past Surgical History:  Procedure Laterality Date   CARDIAC CATHETERIZATION N/A 02/15/2015   Procedure: Left Heart Cath and Coronary Angiography;  Surgeon: Leonie Man, MD;  Location: [facility];  Service: Cardiovascular;  Laterality: N/A;   CATARACT EXTRACTION     CESAREAN SECTION     COLONOSCOPY  2008   negative per pt's report, with [facility]    IR ANGIO INTRA EXTRACRAN SEL COM CAROTID INNOMINATE BILAT MOD SED  01/11/2021   IR ANGIO VERTEBRAL SEL VERTEBRAL BILAT MOD SED  01/11/2021  IR RADIOLOGIST EVAL & MGMT  01/08/2021   IR US GUIDE VASC ACCESS RIGHT  01/11/2021   LAPAROSCOPIC APPENDECTOMY N/A 01/31/2015   Procedure: LYSIS OF ADHESIONS [place] Form LAPAROSCOPIC ;  Surgeon: Alphonsa Overall, MD;  Location: [facility];  Service: General;  Laterality: N/A;     Allergies: Penicillins  Medications: Prior to Admission medications   Medication Sig Start Date End Date Taking? Authorizing Provider  albuterol (VENTOLIN HFA) 108 (90 Base) MCG/ACT inhaler Inhale 2 puffs into the lungs every 6 (six) hours as needed for wheezing. 04/27/19  Yes Magdalen Spatz, NP  aspirin 81 MG tablet Take 81 mg by mouth daily.   Yes [provider]  atorvastatin (LIPITOR) 40 MG tablet Take 1 tablet (40 mg total) by mouth daily. 05/04/20  Yes Turner, Eber Hong, MD  Cholecalciferol (VITAMIN D3) 3000 UNITS TABS Take 3,000 Units by mouth daily.   Yes [provider]  CINNAMON PO Take 2,000 mg by mouth daily.   Yes [provider]  clonazePAM (KLONOPIN) 1 MG tablet Take 1 mg by mouth See admin instructions. Take 1mg  every AM then twice a day as needed for anxiety. 12/11/14  Yes [provider]  clopidogrel (PLAVIX) 75 MG tablet Take 1 tablet (75 mg total) by mouth daily. 01/15/21 05/15/21 Yes Covington, Ardeth Perfect, NP  fexofenadine (ALLEGRA) 180 MG tablet Take 180 mg by mouth daily.   Yes [provider]  fluticasone (FLONASE) 50 MCG/ACT nasal spray Place 1 spray into both nostrils daily as needed for allergies.  08/02/13  Yes [provider]  Fluticasone-Umeclidin-Vilant (TRELEGY ELLIPTA) 100-62.5-25 MCG/INH AEPB TAKE 1 PUFF BY MOUTH EVERY DAY 07/19/20  Yes Hunsucker, Bonna Gains, MD  glimepiride (AMARYL) 4 MG tablet Take 4 mg by mouth daily with breakfast. 01/23/15  Yes [provider]  metFORMIN (GLUCOPHAGE) 500 MG tablet Take 500 mg by mouth daily with breakfast.   Yes [provider]  metoprolol tartrate (LOPRESSOR) 25 MG tablet TAKE 1 TABLET BY MOUTH TWICE DAILY. 05/15/20  Yes Turner, Eber Hong, MD  Multiple Vitamins-Minerals (WOMENS MULTIVITAMIN PLUS PO) Take 1 tablet by mouth daily.    Yes [provider]  Polyethyl Glycol-Propyl Glycol (SYSTANE OP) Apply 1 drop to eye every 6 (six) hours as needed (dry eyes).    Yes [provider]  vitamin C (ASCORBIC ACID) 500 MG tablet Take 500 mg by mouth daily.   Yes [provider]     Family History  Problem Relation Age of Onset   Stroke Mother    Cancer Father        prostate   Breast cancer Neg Hx     Social History   Socioeconomic History   Marital status: Divorced    Spouse name: Not on file   Number of children: 2   Years of education: Not on file   Highest education level: Not on file  Occupational History    Comment: retired grade school/middle school teacher  Tobacco Use   Smoking status: Former    Packs/day: 0.50    Years: 51.00    Pack years: 25.50    Types: Cigarettes   Smokeless tobacco: Never   Tobacco comments:    half pack daily-07/19/2020-AH    Pt states she has not smoked since being diagnosed with the aneurysm-01/30/21- AD  Vaping Use   Vaping Use: Never used  Substance and Sexual Activity   Alcohol use: Not Currently   Drug use: No   Sexual activity: Not  on file  Other Topics Concern   Not on file  Social History Narrative   Not on file   Social Determinants of Health   Financial Resource Strain: Not on file  Food Insecurity: Not on file  Transportation Needs: Not on file  Physical Activity: Not on file  Stress: Not on file  Social Connections: Not on file    Review of Systems: A 12 point ROS discussed and pertinent positives are indicated in the HPI above.  All other systems are negative.  Review of Systems  Constitutional:  Negative for activity change, fatigue and fever.  HENT:  Negative for tinnitus and trouble swallowing.   Eyes:  Negative for visual disturbance.  Respiratory:  Negative for cough and shortness of breath.   Cardiovascular:  Negative for chest pain.  Gastrointestinal:  Negative for abdominal pain, nausea and vomiting.  Musculoskeletal:  Negative for back pain and gait problem.  Neurological:  Negative for dizziness, tremors, seizures, syncope, facial asymmetry,  speech difficulty, weakness, light-headedness, numbness and headaches.  Psychiatric/Behavioral:  Negative for behavioral problems and confusion.   All other systems reviewed and are negative.  Vital Signs: BP (!) 126/59   Pulse (!) 59   Temp 98.5 F (36.9 C) (Oral)   Resp 18   Ht 5\' 4"  (1.626 m)   Wt 150 lb (68 kg)   SpO2 99%   BMI 25.75 kg/m   Physical Exam Vitals reviewed.  HENT:     Mouth/Throat:     Mouth: Mucous membranes are moist.  Eyes:     Extraocular Movements: Extraocular movements intact.  Cardiovascular:     Rate and Rhythm: Normal rate and regular rhythm.     Heart sounds: Normal heart sounds.  Pulmonary:     Effort: Pulmonary effort is normal.     Breath sounds: Normal breath sounds.  Abdominal:     Palpations: Abdomen is soft.     Tenderness: There is no abdominal tenderness.  Musculoskeletal:        General: Normal range of motion.     Right lower leg: No edema.     Left lower leg: No edema.  Skin:    General: Skin is warm.  Neurological:     Mental Status: She is alert and oriented to person, place, and time.  Psychiatric:        Mood and Affect: Mood normal.        Behavior: Behavior normal.        Thought Content: Thought content normal.        Judgment: Judgment normal.    Imaging: IR US Guide Vasc Access Right  Result Date: 01/15/2021 CLINICAL DATA:  History of headaches. 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 EXAM: BILATERAL COMMON CAROTID AND INNOMINATE ANGIOGRAPHY COMPARISON:  MRI MRA brain of December 20, 2020. MEDICATIONS: Heparin 2000 units IV. None antibiotic was administered within 1 hour of the procedure. ANESTHESIA/SEDATION: Versed 1 mg IV; Fentanyl 25 mcg IV Moderate Sedation Time:  40 minutes The patient was continuously monitored during the procedure by the interventional radiology nurse under my direct supervision. CONTRAST:  Omnipaque 350 approximately 65 cc. FLUOROSCOPY TIME:  Fluoroscopy  Time: 11 minutes 56 seconds (6 7 2  mGy). COMPLICATIONS: None immediate. TECHNIQUE: Informed written consent was obtained from the patient after a thorough discussion of the procedural risks, benefits and alternatives. All questions were addressed. Maximal Sterile Barrier Technique was utilized including caps, mask, sterile gowns, sterile gloves, sterile drape, hand  hygiene and skin antiseptic. A timeout was performed prior to the initiation of the procedure. The right forearm to the wrist was prepped and draped in usual sterile manner. The right radial artery was identified with ultrasound, and its morphology documented on imaging. A dorsal palmar anastomosis was verified to be present. Using ultrasound guidance and micropuncture set access into the right radial artery was obtained over a 0.018 inch micro guidewire. The obturator, and the micro guidewire were removed. Good aspiration obtained from the side port of the radial sheath. A cocktail of 2000 units of heparin, 2.5 mg of verapamil, and 200 mcg of nitroglycerin was then infused in diluted form without event. A right radial arteriogram was then performed. Over a 0.035 inch Roadrunner guidewire, a 5 [place] Simmons 2 diagnostic catheter was advanced to the aortic arch region, positioned at the origin of the right vertebral artery, the right common carotid artery, the left common carotid artery and the left vertebral artery. A radial band was applied for hemostasis at the right radial puncture site. Distal right radial pulse was verified to be present. FINDINGS: Non dominant right vertebral artery origin is widely patent. The vessel is seen to the cranial skull base where it supplies primarily the ipsilateral right posterior-inferior cerebellar artery. The right common carotid arteriogram demonstrates the right external carotid artery and its origin to be widely patent. The right internal carotid artery at the bulb has a smooth shallow atherosclerotic plaque  without evidence of significant stenosis or of intraluminal filling defects. The vessel is seen to opacify to the cranial skull base. Suggestion of a small focal outpouching of the distal cervical right ICA at the level of C3. This measures 5.2 mm in width, and 2.6 mm in depth. Distal to this the petrous, cavernous and supraclinoid segments are widely patent. The right middle cerebral artery and the right anterior cerebral artery opacify into the capillary and venous phases. The origin of the left vertebral artery has mild stenosis. More distally the vessel is seen to opacify to the cranial skull base. Patency is seen of the left vertebrobasilar junction and the left posterior-inferior cerebellar artery. The basilar artery, the posterior cerebral arteries, the superior cerebellar arteries and the anterior-inferior cerebellar arteries opacify into the capillary and venous phases. The left common carotid arteriogram demonstrates the left external carotid artery and its major branches to be widely patent. The left internal carotid artery at the bulb has small ulceration along the posterior wall without evidence of associated stenosis or of intraluminal filling defects. More distally the vessel is seen to opacify to the cranial skull base. The petrous, the cavernous and the supraclinoid segments are widely patent. Arising in the left internal carotid artery terminus is a mildly bilobed saccular aneurysm projecting superiorly. The aneurysm measures 7.2 mm x 4.5 mm on the lateral projection and 5.7 mm x 6.9 mm in its AP projection. The left middle cerebral artery and the left anterior cerebral artery opacify into the capillary and venous phases. 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 PLAN: Findings reviewed with the patient and her daughter. As per our conversation  during the initial consultation, the option of endovascular treatment had been reviewed. Again briefly discussed was the procedure, the potential for complication of a thromboembolic stroke of 6-9% and a remote possibility of a fatality. The procedure would be  performed under general anesthesia. The patient would have to be started on aspirin 81 mg and Plavix 75 mg at least 10 days prior to the procedure. Questions were answered to their satisfaction. The patient and her daughter are in agreement to proceed with the endovascular treatment with general anesthesia. This will be scheduled as soon as possible. In the meantime, they were asked to call should they have any concerns or questions. INDICATION: History of headaches. 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 Electronically Signed   By: Luanne Bras M.D.   On: 01/14/2021 09:09   IR Radiologist Eval & Mgmt  Result Date: 01/08/2021 EXAM: NEW PATIENT OFFICE VISIT CHIEF COMPLAINT: Intermittent headaches. Discovery of unruptured left internal carotid artery brain aneurysm. Current Pain Level: 1-10 HISTORY OF PRESENT ILLNESS: Patient is a 75 year old right handed female with history of COPD, diabetes, coronary artery disease who has been referred by Dr. 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 The patient was seen by the her neurologist due to an episode of transient paralysis which according to the patient lasted for approximately 4 hours. During this time, she was apparently fully aware of her surroundings and was able to move her eyes. She reports no nausea, vomiting or chest pain, diaphoresis or visual symptoms at this time. She reports no involuntary motor activity of her limbs. She denies having had similar episodes in the past. Patient reports slow recovery to full functionality over the next few hours. Patient  reports no abnormal autonomic dysfunction of her bowel or bladder. Diagnosis * : Date . * : Allergic rhinitis * : . * : Bipolar affective disorder ([place]) * : . * : COPD (chronic obstructive pulmonary disease) (HCC) * : . * : Coronary artery disease * : 02/2015 * : 50% RCA otherwise normal . * : Cough * : 10/30/2011 . * : DM (diabetes mellitus) ([place]) * : . * : Hyperlipidemia * : . * : Leukocytosis * : . * : Mediastinal adenopathy * : 11/26/2011 . * : Pericardial effusion * : . * : Postoperative atrial fibrillation ([facility]CC) * : . * : Smoking * : . * : SOB (shortness of breath) * : 10/30/2011 PAST SURGICAL HISTORY: Past Surgical History: Procedure * : Laterality * : Date . * : CARDIAC CATHETERIZATION * : N/A * : 02/15/2015 * : Procedure: Left Heart Cath and Coronary Angiography; Surgeon: Leonie Man, MD; Location: [facility]; Service: Cardiovascular; Laterality: N/A; . * : CATARACT EXTRACTION * : * : . * : CESAREAN SECTION * : * : . * : COLONOSCOPY * : * : 2008 * : negative per pt's report, with [facility] . * : LAPAROSCOPIC APPENDECTOMY * : N/A * : 01/31/2015 * : Procedure: LYSIS OF ADHESIONS /APPENDECTOMY LAPAROSCOPIC ; Surgeon: Alphonsa Overall, MD; Location: [facility]; Service: General; Laterality: N/A; MEDICATIONS: Current Outpatient Medications on File Prior to Visit Medication * : Sig * : Dispense * : Refill . * : albuterol (VENTOLIN HFA) 108 (90 Base) MCG/ACT inhaler * : Inhale 2 puffs into the lungs every 6 (six) hours as needed for wheezing. * : 8 g * : 3 . * : aspirin 81 MG tablet * : Take 81 mg by mouth daily. * : * : . * : atorvastatin (LIPITOR) 40 MG tablet * : Take 1 tablet (40 mg total) by mouth daily. * :  90 tablet * : 3 . * : Cholecalciferol (VITAMIN D3) 3000 UNITS TABS * : Take 1 tablet by mouth daily. * : * : . * : Cinnamon 500 MG TABS * : Take 1 tablet by mouth daily. * : * : . * : clonazePAM (KLONOPIN) 1 MG tablet * : Take 1 tablet by mouth daily. * : * : . * : fluticasone (FLONASE) 50 MCG/ACT nasal  spray * : Place 1 spray into both nostrils daily as needed for allergies. * : * : . * : Fluticasone-Umeclidin-Vilant (TRELEGY ELLIPTA) 100-62.5-25 MCG/INH AEPB * : TAKE 1 PUFF BY MOUTH EVERY DAY * : 60 each * : 11 . * : glimepiride (AMARYL) 4 MG tablet * : Take 1 tablet by mouth daily. * : * : . * : metFORMIN (GLUCOPHAGE-XR) 500 MG 24 hr tablet * : Take 500 mg by mouth 2 (two) times daily. * : * : . * : metoprolol tartrate (LOPRESSOR) 25 MG tablet * : TAKE 1 TABLET BY MOUTH TWICE DAILY. * : 180 tablet * : 3 . * : Multiple Vitamins-Minerals (WOMENS MULTIVITAMIN PLUS PO) * : Take 1 tablet by mouth daily. * : * : . * : nicotine (NICOTROL) 10 MG inhaler * : Inhale 1 Cartridge (1 continuous puffing total) as needed into the lungs for smoking cessation. * : 42 each * : 3 . * : vitamin C (ASCORBIC ACID) 500 MG tablet * : Take 500 mg by mouth daily. * : * : ALLERGIES: Allergies Allergen * : Reactions . * : Penicillins * : Hives and Swelling * : * : Has patient had a PCN reaction causing immediate rash, facial/tongue/throat swelling, SOB or lightheadedness with hypotension: Yes has patient had a PCN reaction causing severe rash involving mucus membranes or skin necrosis: Yes has patient had a PCN reaction that required hospitalization Yes- hospital for 7 days has patient had a PCN reaction occurring within the last 10 years: No if all of the above answers are "NO", then may proceed with Cephalosporin use. FAMILY HISTORY: Family History Problem * : Relation * : Age of Onset . * : Stroke * : Mother * : . * : Cancer * : Father * : * :     prostate . * : Breast cancer * : Neg Hx * : REVIEW OF SYSTEMS: Denies any recent chills, fever or rigors. Weight is steady. Normal appetite. Denies any constipation, diarrhea, abdominal pain or of melena. She denies any dysuria, frequency of micturition, or of hematuria. She denies any recent chest pain, shortness of breath or paroxysmal nocturnal dyspnea. Patient is able to tolerate and  perform almost all of her daily routine activities. She denies any recent cough, sputum production, or wheezing. No history of back pain, generalized myalgias, or of joint aches. PHYSICAL EXAMINATION: Appears in no acute distress. Fully alert, awake, oriented to time, place, space. Responses appropriate. Normal eye contact. Speech and comprehension normal. No abnormal lateralizing neurological features. Station and gait normal. ASSESSMENT AND PLAN: The MRI MRA findings reviewed with patient. Patient reports no family history of ruptured or of unruptured intracranial aneurysms, or of abdominal aneurysms. Patient has been a heavy smoker, and is in the process of stopping. She reportedly smokes up to 5 cigarettes a day. She denies using any illicit chemicals. The natural history of unruptured intracranial aneurysms was reviewed with the patient in the presence of her daughter over the phone. Increased risk of rupture related to  hypertension, smoking, female gender, was discussed. Risk of rupture of intracranial aneurysm of 1-2% per year with attendant significant mortality and morbidity was also reviewed. Given the size of the aneurysm, of approximately 7 mm x 7 mm, management considerations were those of endovascular embolization of the intracranial aneurysm using either primary coiling, versus stent assisted coiling versus placement of intra saccular disruption device such as the WEB device. The procedure of endovascular treatment was discussed in detail with patient and daughter. Risks of 1%-2% chance of a thromboembolic stroke, and remote possibility of intracranial hemorrhage with potential fatality was also reviewed in detail. The other option was that of continued medical management with surveillance using CT angiograms or MRAs. The patient has expressed her desire to proceed with elimination of the aneurysm from the circulation with endovascular means. This will be scheduled as soon as possible. Patient was  also advised the patient would have to be started on antiplatelets 7-10 days prior to the procedure. The procedure will be performed under general anesthesia, and will be scheduled as soon as possible. Should the patient and daughter have any concerns or questions they were asked to call. Electronically Signed   By: Luanne Bras M.D.   On: 01/07/2021 09:26   IR ANGIO INTRA EXTRACRAN SEL COM CAROTID INNOMINATE BILAT MOD SED  Result Date: 01/15/2021 CLINICAL DATA:  History of headaches. 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 EXAM: BILATERAL COMMON CAROTID AND INNOMINATE ANGIOGRAPHY COMPARISON:  MRI MRA brain of December 20, 2020. MEDICATIONS: Heparin 2000 units IV. None antibiotic was administered within 1 hour of the procedure. ANESTHESIA/SEDATION: Versed 1 mg IV; Fentanyl 25 mcg IV Moderate Sedation Time:  40 minutes The patient was continuously monitored during the procedure by the interventional radiology nurse under my direct supervision. CONTRAST:  Omnipaque 350 approximately 65 cc. FLUOROSCOPY TIME:  Fluoroscopy Time: 11 minutes 56 seconds (6 7 2  mGy). COMPLICATIONS: None immediate. TECHNIQUE: Informed written consent was obtained from the patient after a thorough discussion of the procedural risks, benefits and alternatives. All questions were addressed. Maximal Sterile Barrier Technique was utilized including caps, mask, sterile gowns, sterile gloves, sterile drape, hand hygiene and skin antiseptic. A timeout was performed prior to the initiation of the procedure. The right forearm to the wrist was prepped and draped in usual sterile manner. The right radial artery was identified with ultrasound, and its morphology documented on imaging. A dorsal palmar anastomosis was verified to be present. Using ultrasound guidance and micropuncture set access into the right radial artery was obtained over a 0.018 inch micro guidewire. The obturator, and the micro guidewire  were removed. Good aspiration obtained from the side port of the radial sheath. A cocktail of 2000 units of heparin, 2.5 mg of verapamil, and 200 mcg of nitroglycerin was then infused in diluted form without event. A right radial arteriogram was then performed. Over a 0.035 inch Roadrunner guidewire, a 5 [place] Simmons 2 diagnostic catheter was advanced to the aortic arch region, positioned at the origin of the right vertebral artery, the right common carotid artery, the left common carotid artery and the left vertebral artery. A radial band was applied for hemostasis at the right radial puncture site. Distal right radial pulse was verified to be present. FINDINGS: Non dominant right vertebral artery origin is widely patent. The vessel is seen to the cranial skull base where it supplies primarily the ipsilateral right posterior-inferior cerebellar artery. The right common carotid arteriogram demonstrates the right external carotid  artery and its origin to be widely patent. The right internal carotid artery at the bulb has a smooth shallow atherosclerotic plaque without evidence of significant stenosis or of intraluminal filling defects. The vessel is seen to opacify to the cranial skull base. Suggestion of a small focal outpouching of the distal cervical right ICA at the level of C3. This measures 5.2 mm in width, and 2.6 mm in depth. Distal to this the petrous, cavernous and supraclinoid segments are widely patent. The right middle cerebral artery and the right anterior cerebral artery opacify into the capillary and venous phases. The origin of the left vertebral artery has mild stenosis. More distally the vessel is seen to opacify to the cranial skull base. Patency is seen of the left vertebrobasilar junction and the left posterior-inferior cerebellar artery. The basilar artery, the posterior cerebral arteries, the superior cerebellar arteries and the anterior-inferior cerebellar arteries opacify into the  capillary and venous phases. The left common carotid arteriogram demonstrates the left external carotid artery and its major branches to be widely patent. The left internal carotid artery at the bulb has small ulceration along the posterior wall without evidence of associated stenosis or of intraluminal filling defects. More distally the vessel is seen to opacify to the cranial skull base. The petrous, the cavernous and the supraclinoid segments are widely patent. Arising in the left internal carotid artery terminus is a mildly bilobed saccular aneurysm projecting superiorly. The aneurysm measures 7.2 mm x 4.5 mm on the lateral projection and 5.7 mm x 6.9 mm in its AP projection. The left middle cerebral artery and the left anterior cerebral artery opacify into the capillary and venous phases. 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 PLAN: Findings reviewed with the patient and her daughter. As per our conversation during the initial consultation, the option of endovascular treatment had been reviewed. Again briefly discussed was the procedure, the potential for complication of a thromboembolic stroke of 5-6% and a remote possibility of a fatality. The procedure would be performed under general anesthesia. The patient would have to be started on aspirin 81 mg and Plavix 75 mg at least 10 days prior to the procedure. Questions were answered to their satisfaction. The patient and her daughter are in agreement to proceed with the endovascular treatment with general anesthesia. This will be scheduled as soon as possible. In the meantime, they were asked to call should they have any concerns or questions. INDICATION: History of headaches. 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 Electronically Signed   By: Luanne Bras  M.D.   On: 01/14/2021 09:09   IR ANGIO VERTEBRAL SEL VERTEBRAL BILAT MOD SED  Result Date: 01/15/2021 CLINICAL DATA:  History of headaches. 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 EXAM: BILATERAL COMMON CAROTID AND INNOMINATE ANGIOGRAPHY COMPARISON:  MRI MRA brain of December 20, 2020. MEDICATIONS: Heparin 2000 units IV. None antibiotic was administered within 1 hour of the procedure. ANESTHESIA/SEDATION: Versed 1 mg IV; Fentanyl 25 mcg IV Moderate Sedation Time:  40 minutes The patient was continuously monitored during the procedure by the interventional radiology nurse under my direct supervision. CONTRAST:  Omnipaque 350 approximately 65 cc. FLUOROSCOPY TIME:  Fluoroscopy Time: 11 minutes 56 seconds (6 7 2  mGy). COMPLICATIONS: None immediate. TECHNIQUE: Informed written consent was obtained from  the patient after a thorough discussion of the procedural risks, benefits and alternatives. All questions were addressed. Maximal Sterile Barrier Technique was utilized including caps, mask, sterile gowns, sterile gloves, sterile drape, hand hygiene and skin antiseptic. A timeout was performed prior to the initiation of the procedure. The right forearm to the wrist was prepped and draped in usual sterile manner. The right radial artery was identified with ultrasound, and its morphology documented on imaging. A dorsal palmar anastomosis was verified to be present. Using ultrasound guidance and micropuncture set access into the right radial artery was obtained over a 0.018 inch micro guidewire. The obturator, and the micro guidewire were removed. Good aspiration obtained from the side port of the radial sheath. A cocktail of 2000 units of heparin, 2.5 mg of verapamil, and 200 mcg of nitroglycerin was then infused in diluted form without event. A right radial arteriogram was then performed. Over a 0.035 inch Roadrunner guidewire, a 5 [place] Simmons 2 diagnostic catheter was advanced  to the aortic arch region, positioned at the origin of the right vertebral artery, the right common carotid artery, the left common carotid artery and the left vertebral artery. A radial band was applied for hemostasis at the right radial puncture site. Distal right radial pulse was verified to be present. FINDINGS: Non dominant right vertebral artery origin is widely patent. The vessel is seen to the cranial skull base where it supplies primarily the ipsilateral right posterior-inferior cerebellar artery. The right common carotid arteriogram demonstrates the right external carotid artery and its origin to be widely patent. The right internal carotid artery at the bulb has a smooth shallow atherosclerotic plaque without evidence of significant stenosis or of intraluminal filling defects. The vessel is seen to opacify to the cranial skull base. Suggestion of a small focal outpouching of the distal cervical right ICA at the level of C3. This measures 5.2 mm in width, and 2.6 mm in depth. Distal to this the petrous, cavernous and supraclinoid segments are widely patent. The right middle cerebral artery and the right anterior cerebral artery opacify into the capillary and venous phases. The origin of the left vertebral artery has mild stenosis. More distally the vessel is seen to opacify to the cranial skull base. Patency is seen of the left vertebrobasilar junction and the left posterior-inferior cerebellar artery. The basilar artery, the posterior cerebral arteries, the superior cerebellar arteries and the anterior-inferior cerebellar arteries opacify into the capillary and venous phases. The left common carotid arteriogram demonstrates the left external carotid artery and its major branches to be widely patent. The left internal carotid artery at the bulb has small ulceration along the posterior wall without evidence of associated stenosis or of intraluminal filling defects. More distally the vessel is seen to  opacify to the cranial skull base. The petrous, the cavernous and the supraclinoid segments are widely patent. Arising in the left internal carotid artery terminus is a mildly bilobed saccular aneurysm projecting superiorly. The aneurysm measures 7.2 mm x 4.5 mm on the lateral projection and 5.7 mm x 6.9 mm in its AP projection. The left middle cerebral artery and the left anterior cerebral artery opacify into the capillary and venous phases. 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 PLAN: Findings reviewed with the patient and her daughter. As per our conversation during the initial consultation, the option  of endovascular treatment had been reviewed. Again briefly discussed was the procedure, the potential for complication of a thromboembolic stroke of 5-1% and a remote possibility of a fatality. The procedure would be performed under general anesthesia. The patient would have to be started on aspirin 81 mg and Plavix 75 mg at least 10 days prior to the procedure. Questions were answered to their satisfaction. The patient and her daughter are in agreement to proceed with the endovascular treatment with general anesthesia. This will be scheduled as soon as possible. In the meantime, they were asked to call should they have any concerns or questions. INDICATION: History of headaches. 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 Electronically Signed   By: Luanne Bras M.D.   On: 01/14/2021 09:09    Labs:  CBC: Recent Labs    09/27/20 1608 09/27/20 1634 01/11/21 0834 01/31/21 0627  WBC 16.8*  --  18.1* 13.3*  HGB 10.7* 11.6* 12.0 11.9*  HCT 34.8* 34.0* 37.4 38.1  PLT 443*  --  372 358    COAGS: Recent Labs    09/27/20 1608 01/11/21 0834 01/31/21 0627  INR 1.0 1.1 1.0  APTT 30 28  --      BMP: Recent Labs    09/27/20 1608 09/27/20 1634 01/11/21 0834 01/31/21 0627  NA 138 138 138 136  K 4.2 4.6 3.7 4.0  CL 110 110 106 104  CO2 16*  --  21* 21*  GLUCOSE 93 95 276* 169*  BUN 22 26* 16 25*  CALCIUM 9.1  --  9.4 9.0  CREATININE 1.07* 0.90 0.94 1.05*  GFRNONAA 55*  --  >60 56*    LIVER FUNCTION TESTS: Recent Labs    05/03/20 1557 06/15/20 1007 09/27/20 1608  BILITOT  --   --  0.3  AST  --   --  20  ALT 13 10 17   ALKPHOS  --   --  92  PROT  --   --  7.5  ALBUMIN  --   --  3.5    TUMOR MARKERS: No results for input(s): AFPTM, CEA, CA199, CHROMGRNA in the last 8760 hours.  Assessment and Plan:  Left internal carotid artery aneurysm For embolization in IR with Dr Estanislado Pandy Risks and benefits of cerebral angiogram with intervention were discussed with the patient including, but not limited to bleeding, infection, vascular injury, contrast induced renal failure, stroke or even death.  This interventional procedure involves the use of X-rays and because of the nature of the planned procedure, it is possible that we will have prolonged use of X-ray fluoroscopy.  Potential radiation risks to you include (but are not limited to) the following: - A slightly elevated risk for cancer  several years later in life. This risk is typically less than 0.5% percent. This risk is low in comparison to the normal incidence of human cancer, which is 33% for women and 50% for men according to the [place]. - Radiation induced injury can include skin redness, resembling a rash, tissue breakdown / ulcers and hair loss (which can be temporary or permanent).   The likelihood of either of these occurring depends on the difficulty of the procedure and whether you are sensitive to radiation due to previous procedures, disease, or genetic conditions.   IF your procedure requires a prolonged use of radiation, you will be notified and given written instructions for  further action.  It is your responsibility to monitor the irradiated  area for the 2 weeks following the procedure and to notify your physician if you are concerned that you have suffered a radiation induced injury.    All of the patient's questions were answered, patient is agreeable to proceed. Consent signed and in chart.   Pt is aware if intervention is performed, she will be admitted overnight for observation and DC in am if stable  Thank you for this interesting consult.  I greatly enjoyed meeting Mercedes Thornton and look forward to participating in their care.  A copy of this report was sent to the requesting provider on this date.  Electronically Signed: Lavonia Drafts, PA-C 01/31/2021, 8:01 AM   I spent a total of    25 Minutes in face to face in clinical consultation, greater than 50% of which was counseling/coordinating care for L ICA aneurysm embolization

## 2021-01-31 NOTE — Anesthesia Postprocedure Evaluation (Signed)
Anesthesia Post Note  Patient: Mercedes Thornton  Procedure(s) Performed: EMBOLIZATION     Patient location during evaluation: PACU Anesthesia Type: General Level of consciousness: awake and alert Pain management: pain level controlled Vital Signs Assessment: post-procedure vital signs reviewed and stable Respiratory status: spontaneous breathing, nonlabored ventilation and respiratory function stable Cardiovascular status: blood pressure returned to baseline and stable Postop Assessment: no apparent nausea or vomiting Anesthetic complications: no   No notable events documented.  Last Vitals:  Vitals:   01/31/21 1355 01/31/21 1400  BP: 113/67 (!) 111/57  Pulse: 75 64  Resp: 14 (!) 23  Temp:    SpO2: 92% 91%    Last Pain:  Vitals:   01/31/21 1400  TempSrc:   PainSc: 0-No pain                 Lynda Rainwater

## 2021-01-31 NOTE — Progress Notes (Signed)
ANTICOAGULATION CONSULT NOTE - Initial Consult  Pharmacy Consult for heparin Indication:  Post neuro-IR intervention  Allergies  Allergen Reactions   Penicillins Hives and Swelling    Has patient had a PCN reaction causing immediate rash, facial/tongue/throat swelling, SOB or lightheadedness with hypotension: Yes Has patient had a PCN reaction causing severe rash involving mucus membranes or skin necrosis: Yes Has patient had a PCN reaction that required hospitalization Yes- hospital for 7 days Has patient had a PCN reaction occurring within the last 10 years: No If all of the above answers are "NO", then may proceed with Cephalosporin use.     Patient Measurements: Height: 5\' 4"  (162.6 cm) Weight: 68 kg (150 lb) IBW/kg (Calculated) : 54.7 Heparin Dosing Weight: 68 kg   Vital Signs: Temp: 97.7 F (36.5 C) (10/20 1245) Temp Source: Oral (10/20 0627) BP: 119/53 (10/20 1300) Pulse Rate: 62 (10/20 1300)  Labs: Recent Labs    01/31/21 0627  HGB 11.9*  HCT 38.1  PLT 358  LABPROT 12.9  INR 1.0  CREATININE 1.05*    Estimated Creatinine Clearance: 44.5 mL/min (A) (by C-G formula based on SCr of 1.05 mg/dL (H)).   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   01/31/2021   atorvastatin (LIPITOR) 40 MG tablet Take 1 tablet (40 mg total) by mouth daily. 90 tablet 3  01/31/2021 at 0530   Cholecalciferol (VITAMIN D3) 3000 UNITS TABS Take 3,000 Units by mouth daily.   01/30/2021   CINNAMON PO Take 2,000 mg by mouth daily.   01/30/2021   clonazePAM (KLONOPIN) 1 MG tablet Take 1 mg by mouth See admin instructions. Take 1mg  every AM then twice a day as needed for anxiety.   01/30/2021   clopidogrel (PLAVIX) 75 MG tablet Take 1 tablet (75 mg total) by mouth daily. 30 tablet 3 01/31/2021 at 0530   fexofenadine (ALLEGRA) 180 MG tablet Take 180 mg by mouth daily.   01/31/2021 at 0530   Fluticasone-Umeclidin-Vilant (TRELEGY ELLIPTA) 100-62.5-25 MCG/INH AEPB TAKE 1 PUFF BY MOUTH EVERY DAY 60 each 11 01/31/2021 at 0530   glimepiride (AMARYL) 4 MG tablet Take 4 mg by mouth daily with breakfast.   01/30/2021   metFORMIN (GLUCOPHAGE) 500 MG tablet Take 500 mg by mouth daily with breakfast.   01/30/2021   metoprolol tartrate (LOPRESSOR) 25 MG tablet TAKE 1 TABLET BY MOUTH TWICE DAILY. 180 tablet 3 01/31/2021 at 0530   Multiple Vitamins-Minerals (WOMENS MULTIVITAMIN PLUS PO) Take 1 tablet by mouth daily.    01/30/2021   Polyethyl Glycol-Propyl Glycol (SYSTANE OP) Apply 1 drop to eye every 6 (six) hours as needed (dry eyes).   Past Week   vitamin C (ASCORBIC ACID) 500 MG tablet Take 500 mg by mouth daily.   01/30/2021   albuterol (VENTOLIN HFA) 108 (  90 Base) MCG/ACT inhaler Inhale 2 puffs into the lungs every 6 (six) hours as needed for wheezing. 8 g 3 More than a month   fluticasone (FLONASE) 50 MCG/ACT nasal spray Place 1 spray into both nostrils daily as needed for allergies.    More than a month    Assessment: 49 YOF s/p embolization of Lt ICA terminus aneurysm started on IV heparin per neuro-IR protocol.   H/H and Plt wnl. SCr mildly elevated   Goal of Therapy:  Heparin level 0.1-0.25 units/ml Monitor platelets by anticoagulation protocol: Yes   Plan:  -Start IV heparin at 600 units/hr. No bolus -F/u 8 hr HL -Monitor daily HL, CBC and s/s of bleeding  -Heparin  infusion to stop at 8 AM tomorrow per neuro-IR protocol    Albertina Parr, PharmD., BCPS, BCCCP Clinical Pharmacist Please refer to [facility] for unit-specific pharmacist

## 2021-01-31 NOTE — Progress Notes (Signed)
Brief NIR post procedure note:  Patient seen at bedside, denies complaints besides being cold, looking forward to eating [place] sandwich. Right radial TR band level 0 without active bleeding noted, palpable right radial pulse, left radial art line present with good waveform.  Alert, awake, and oriented x 3 Speech and comprehension in tact PERRL - 3 mm bilaterally EOMs without nystagmus or subjective diplopia. Visual fields grossly in tact (reads clock on wall) No facial asymmetry Tongue midline Motor power full in all 4 extremities Negative pronator drift Fine motor and coordination in tact BUE  Plan: - Continue heparin gtt, Plavix 75 mg + ASA 325 mg PO tomorrow @ 1000 - Up in chair, encourage deep breathing for mucus clearance post intubation, diet as tolerated but encouraged soft foods in case nausea/vomiting - Elevate RUE x 24H - CBC, BMP tomorrow AM - Hold home DM meds, SSI/CBG checks ordered - NIR will see patient tomorrow AM for discharge if stable overnight  Please call on call NIR overnight with questions or concerns.  Candiss Norse, PA-C

## 2021-01-31 NOTE — Anesthesia Procedure Notes (Signed)
Arterial Line Insertion Start/End10/20/2022 8:10 AM, 01/31/2021 8:20 AM Performed by: Josephine Igo, CRNA, CRNA  Patient location: Pre-op. Preanesthetic checklist: patient identified, IV checked, site marked, risks and benefits discussed, surgical consent, monitors and equipment checked, pre-op evaluation, timeout performed and anesthesia consent Lidocaine 1% used for infiltration Left, radial was placed Catheter size: 20 G Hand hygiene performed  and maximum sterile barriers used   Attempts: 2 Procedure performed without using ultrasound guided technique. Following insertion, dressing applied and Biopatch. Post procedure assessment: normal and unchanged  Post procedure complications: unsuccessful attempts. Patient tolerated the procedure well with no immediate complications.

## 2021-01-31 NOTE — Transfer of Care (Signed)
Immediate Anesthesia Transfer of Care Note  Patient: Mercedes Thornton  Procedure(s) Performed: EMBOLIZATION  Patient Location: PACU  Anesthesia Type:General  Level of Consciousness: awake, alert  and patient cooperative  Airway & Oxygen Therapy: Patient Spontanous Breathing and Patient connected to face mask oxygen  Post-op Assessment: Report given to RN and Post -op Vital signs reviewed and stable  Post vital signs: Reviewed and stable  Last Vitals:  Vitals Value Taken Time  BP 122/56 01/31/21 1242  Temp    Pulse 63 01/31/21 1245  Resp 24 01/31/21 1245  SpO2 95 % 01/31/21 1245  Vitals shown include unvalidated device data.  Last Pain:  Vitals:   01/31/21 0711  TempSrc:   PainSc: 0-No pain         Complications: No notable events documented.

## 2021-01-31 NOTE — Progress Notes (Signed)
Patient transported to PACU bay 4. TR band clean,dry,intact. Report given to PACU RN. PACU nurse informed that there is an order for Heparin drip is to be initiated in PACU.

## 2021-01-31 NOTE — Progress Notes (Signed)
Patient's Mercedes Thornton. I retook the Mercedes and it was 500. Dr. Kathi Ludwig notified. He notified CCM. Thayer Ohm D

## 2021-01-31 NOTE — Progress Notes (Signed)
eLink Physician-Brief Progress Note Patient Name: Mercedes Thornton DOB: [birth year] MRN: [id number]   Date of Service  01/31/2021  HPI/Events of Note  Patient was placed on insulin gtt post-op due to blood sugar > 500 mg / dl, patient now taking PO and her blood sugar is < 250 mg / dl.  eICU Interventions  Hyperglycemia transition insulin protocol orders entered.        Kerry Kass [name] 01/31/2021, 11:40 PM

## 2021-01-31 NOTE — Progress Notes (Signed)
[facility] for heparin Indication:  Post neuro-IR intervention  Allergies  Allergen Reactions   Penicillins Hives and Swelling    Has patient had a PCN reaction causing immediate rash, facial/tongue/throat swelling, SOB or lightheadedness with hypotension: Yes Has patient had a PCN reaction causing severe rash involving mucus membranes or skin necrosis: Yes Has patient had a PCN reaction that required hospitalization Yes- hospital for 7 days Has patient had a PCN reaction occurring within the last 10 years: No If all of the above answers are "NO", then may proceed with Cephalosporin use.     Patient Measurements: Height: 5\' 4"  (162.6 cm) Weight: 68 kg (150 lb) IBW/kg (Calculated) : 54.7 Heparin Dosing Weight: 68 kg   Vital Signs: Temp: 97.9 F (36.6 C) (10/20 2000) Temp Source: Oral (10/20 2000) BP: 104/47 (10/20 2300) Pulse Rate: 61 (10/20 2300)  Labs: Recent Labs    01/31/21 0627 01/31/21 1800 01/31/21 2255  HGB 11.9*  --   --   HCT 38.1  --   --   PLT 358  --   --   LABPROT 12.9  --   --   INR 1.0  --   --   HEPARINUNFRC  --   --  0.41  CREATININE 1.05* 1.25*  --      Estimated Creatinine Clearance: 37.4 mL/min (A) (by C-G formula based on SCr of 1.25 mg/dL (H)).  Assessment: 75 yo female s/p embolization of Lt ICA terminus aneurysm for heparin  Goal of Therapy:  Heparin level 0.1-0.25 units/ml Monitor platelets by anticoagulation protocol: Yes   Plan:  Decrease Heparin 500 units/hr  Phillis Knack, PharmD, BCPS

## 2021-01-31 NOTE — Sedation Documentation (Signed)
Right radial sheath removed. TR band applied with 8cc of air at 1202pm.

## 2021-02-01 ENCOUNTER — Encounter (HOSPITAL_COMMUNITY): Payer: Self-pay | Admitting: Interventional Radiology

## 2021-02-01 DIAGNOSIS — I671 Cerebral aneurysm, nonruptured: Principal | ICD-10-CM

## 2021-02-01 LAB — CBC WITH DIFFERENTIAL/PLATELET
Abs Immature Granulocytes: 0.12 10*3/uL — ABNORMAL HIGH (ref 0.00–0.07)
Basophils Absolute: 0.1 10*3/uL (ref 0.0–0.1)
Basophils Relative: 1 %
Eosinophils Absolute: 0.1 10*3/uL (ref 0.0–0.5)
Eosinophils Relative: 1 %
HCT: 27.1 % — ABNORMAL LOW (ref 36.0–46.0)
Hemoglobin: 8.7 g/dL — ABNORMAL LOW (ref 12.0–15.0)
Immature Granulocytes: 1 %
Lymphocytes Relative: 9 %
Lymphs Abs: 1.4 10*3/uL (ref 0.7–4.0)
MCH: 27.5 pg (ref 26.0–34.0)
MCHC: 32.1 g/dL (ref 30.0–36.0)
MCV: 85.8 fL (ref 80.0–100.0)
Monocytes Absolute: 1.4 10*3/uL — ABNORMAL HIGH (ref 0.1–1.0)
Monocytes Relative: 9 %
Neutro Abs: 13.3 10*3/uL — ABNORMAL HIGH (ref 1.7–7.7)
Neutrophils Relative %: 79 %
Platelets: 293 10*3/uL (ref 150–400)
RBC: 3.16 MIL/uL — ABNORMAL LOW (ref 3.87–5.11)
RDW: 17.7 % — ABNORMAL HIGH (ref 11.5–15.5)
WBC: 16.4 10*3/uL — ABNORMAL HIGH (ref 4.0–10.5)
nRBC: 0 % (ref 0.0–0.2)

## 2021-02-01 LAB — BASIC METABOLIC PANEL
Anion gap: 5 (ref 5–15)
BUN: 22 mg/dL (ref 8–23)
CO2: 21 mmol/L — ABNORMAL LOW (ref 22–32)
Calcium: 8.2 mg/dL — ABNORMAL LOW (ref 8.9–10.3)
Chloride: 112 mmol/L — ABNORMAL HIGH (ref 98–111)
Creatinine, Ser: 1 mg/dL (ref 0.44–1.00)
GFR, Estimated: 59 mL/min — ABNORMAL LOW (ref >60)
Glucose, Bld: 149 mg/dL — ABNORMAL HIGH (ref 70–99)
Potassium: 3.9 mmol/L (ref 3.5–5.1)
Sodium: 138 mmol/L (ref 135–145)

## 2021-02-01 LAB — GLUCOSE, CAPILLARY
Glucose-Capillary: 119 mg/dL — ABNORMAL HIGH (ref 70–99)
Glucose-Capillary: 124 mg/dL — ABNORMAL HIGH (ref 70–99)
Glucose-Capillary: 130 mg/dL — ABNORMAL HIGH (ref 70–99)
Glucose-Capillary: 139 mg/dL — ABNORMAL HIGH (ref 70–99)
Glucose-Capillary: 183 mg/dL — ABNORMAL HIGH (ref 70–99)
Glucose-Capillary: 97 mg/dL (ref 70–99)

## 2021-02-01 LAB — PLATELET INHIBITION P2Y12

## 2021-02-01 MED ORDER — INSULIN DETEMIR 100 UNIT/ML ~~LOC~~ SOLN
10.0000 [IU] | Freq: Every day | SUBCUTANEOUS | Status: DC
  Filled 2021-02-01: qty 0.1

## 2021-02-01 MED ORDER — GUAIFENESIN 100 MG/5ML PO LIQD
5.0000 mL | ORAL | Status: DC | PRN
  Administered 2021-02-01: 5 mL via ORAL
  Filled 2021-02-01: qty 15

## 2021-02-01 MED ORDER — CHLORHEXIDINE GLUCONATE CLOTH 2 % EX PADS: 6.0000 | MEDICATED_PAD | Freq: Every day | CUTANEOUS | Status: DC

## 2021-02-01 NOTE — Discharge Summary (Signed)
Patient ID: Mercedes Thornton MRN: [id number] DOB/AGE: [birth year] 75 y.o.  Admit date: 01/31/2021 Discharge date: 02/01/2021  Supervising Physician: Luanne Bras  Patient Status: [facility] - In-pt  Admission Diagnoses: cerebral aneurysm   Discharge Diagnoses:  Active Problems:   Cerebral aneurysm   Brain aneurysm   Discharged Condition: good  Hospital Course:   Patient presented to [facility] for cerebral angiogram with intervention  with Dr. Estanislado Pandy. Procedure occurred without major complications and patient was transferred to NICU in stable condition (VSS, right radial artery puncture site stable) for overnight observation. Patient was found to hyperglycemic overnight, PCCM was consulted for management of hyperglycemia. Glucose this morning 124, at patient's baseline.    Patient awake and alert  no complaints at this time. Her daughter and RN at bedside. Foley catheter removed without difficulty. Patient denies headache, nausea, vomiting, vision changes, pain on right radial puncture site. Right radial artery puncture site stable. Patient appeared to have increased work of breathing, patient states that her breathing is "never good." O2 sat has been at baseline during admission. Patient was able to ambulate, O2 saturation remained stable.   Plan to discharge home today and follow-up with Dr. Estanislado Pandy for 2 weeks after discharge.  Patient was instructed to:  - NOT to take her metformin until tomorrow morning to minimize risk of AKI - continue to take Plavix 75 mg and aspirin 81 mg daily  - no bending, stooping, lifting more than 10 pounds for 2 weeks - no driving for 2 weeks  - follow up with PCP for blood sugar,  kidney function, and blood pressure check within 7 days   Patient and her daughter verbalized understanding.   Consults: None  Significant Diagnostic Studies: none   Treatments: cerebral angiogram with embolization with WEBB device   Discharge Exam: Blood  pressure (!) 132/57, pulse (!) 54, temperature 97.8 F (36.6 C), temperature source Axillary, resp. rate (!) 25, height 5\' 4"  (1.626 m), weight 150 lb (68 kg), SpO2 93 %.  Physical Exam Vitals and nursing note reviewed.  Constitutional:      General: Patient is not in acute distress.    Appearance: Normal appearance. Patient is not ill-appearing.  HENT:     Head: Normocephalic and atraumatic.     Mouth/Throat:     Mouth: Mucous membranes are moist.     Pharynx: Oropharynx is clear.  Cardiovascular:     Rate and Rhythm: Normal rate and regular rhythm.     Pulses: Normal pulses, right radial pulse 1+     Heart sounds: Normal heart sounds.  Pulmonary:     Effort: Pulmonary effort is normal.     Breath sounds: Normal breath sounds.  Abdominal:     General: Abdomen is flat. Bowel sounds are normal.     Palpations: Abdomen is soft.  Musculoskeletal:     Cervical back: Neck supple.  Skin:    General: Skin is warm and dry.     Coloration: Skin is not jaundiced or pale.  Positive dressing on right radial puncture site. Site is unremarkable with no erythema, edema, tenderness, bleeding or drainage. Minimal amount of old, dry blood noted on the dressing. Dressing otherwise clean, dry, and intact.   Neurological:     Mental Status: Patient is alert and oriented to person, place, and time.  Alert, awake, and oriented x3 Speech and comprehension intact PERRL  EOMs intact No facial asymmetry. Tongue midline  Motor power intact all 4  No pronator  drift. Fine motor and coordination intact on right, slow on left  Gait intact  Distal pulses r radial artery 1+   Psychiatric:        Mood and Affect: Mood normal.        Behavior: Behavior normal.        Judgment: Judgment normal.    Disposition:  Discharge disposition: 01-Home or Self Care      Discharge Instructions     Call MD for:   Complete by: As directed    Call MD for:  difficulty breathing, headache or visual  disturbances   Complete by: As directed    Call MD for:  extreme fatigue   Complete by: As directed    Call MD for:  hives   Complete by: As directed    Call MD for:  persistant dizziness or light-headedness   Complete by: As directed    Call MD for:  persistant nausea and vomiting   Complete by: As directed    Call MD for:  redness, tenderness, or signs of infection (pain, swelling, redness, odor or green/yellow discharge around incision site)   Complete by: As directed    Call MD for:  severe uncontrolled pain   Complete by: As directed    Call MD for:  temperature >100.4   Complete by: As directed    Diet - low sodium heart healthy   Complete by: As directed    Diet Carb Modified   Complete by: As directed    Discharge wound care:   Complete by: As directed    Further dressing change as needed. Keep the right wrist puncture site clean and dry till fully heals, no submerging for 7 days.   Driving Restrictions   Complete by: As directed    No driving for 2 weeks.   Increase activity slowly   Complete by: As directed    Lifting restrictions   Complete by: As directed    No bending, stooping, lifting more than 10 pounds for 2 weeks.   Other Restrictions   Complete by: As directed    Do not submerge right wrist (sitting in tub, swimming) for 7 days.      Allergies as of 02/01/2021       Reactions   Penicillins Hives, Swelling   Has patient had a PCN reaction causing immediate rash, facial/tongue/throat swelling, SOB or lightheadedness with hypotension: Yes Has patient had a PCN reaction causing severe rash involving mucus membranes or skin necrosis: Yes Has patient had a PCN reaction that required hospitalization Yes- hospital for 7 days Has patient had a PCN reaction occurring within the last 10 years: No If all of the above answers are "NO", then may proceed with Cephalosporin use.        Medication List     TAKE these medications    albuterol 108 (90 Base)  MCG/ACT inhaler Commonly known as: VENTOLIN HFA Inhale 2 puffs into the lungs every 6 (six) hours as needed for wheezing.   aspirin 81 MG tablet Take 81 mg by mouth daily.   atorvastatin 40 MG tablet Commonly known as: LIPITOR Take 1 tablet (40 mg total) by mouth daily.   CINNAMON PO Take 2,000 mg by mouth daily.   clonazePAM 1 MG tablet Commonly known as: KLONOPIN Take 1 mg by mouth See admin instructions. Take 1mg  every AM then twice a day as needed for anxiety.   clopidogrel 75 MG tablet Commonly known as: Plavix Take 1 tablet (75  mg total) by mouth daily.   fexofenadine 180 MG tablet Commonly known as: ALLEGRA Take 180 mg by mouth daily.   fluticasone 50 MCG/ACT nasal spray Commonly known as: FLONASE Place 1 spray into both nostrils daily as needed for allergies.   glimepiride 4 MG tablet Commonly known as: AMARYL Take 4 mg by mouth daily with breakfast.   metFORMIN 500 MG tablet Commonly known as: GLUCOPHAGE Take 500 mg by mouth daily with breakfast.   metoprolol tartrate 25 MG tablet Commonly known as: LOPRESSOR TAKE 1 TABLET BY MOUTH TWICE DAILY.   SYSTANE OP Apply 1 drop to eye every 6 (six) hours as needed (dry eyes).   Trelegy Ellipta 100-62.5-25 MCG/ACT Aepb Generic drug: Fluticasone-Umeclidin-Vilant TAKE 1 PUFF BY MOUTH EVERY DAY   vitamin C 500 MG tablet Commonly known as: ASCORBIC ACID Take 500 mg by mouth daily.   Vitamin D3 75 MCG (3000 UT) Tabs Take 3,000 Units by mouth daily.   WOMENS MULTIVITAMIN PLUS PO Take 1 tablet by mouth daily.               Discharge Care Instructions  (From admission, onward)           Start     Ordered   02/01/21 0000  Discharge wound care:       Comments: Further dressing change as needed. Keep the right wrist puncture site clean and dry till fully heals, no submerging for 7 days.   02/01/21 1324            Follow-up Information     Luanne Bras, MD Follow up.   Specialties:  Interventional Radiology, Radiology Why: 2 week follow up visit with Dr. Estanislado Pandy. Our schedulers will call you to set up the appointment. Contact information: [place] [address] [phone]                  Electronically Signed: Tera Mater, PA-C 02/01/2021, 1:24 PM   I have spent Greater Than 30 Minutes discharging Mignon.

## 2021-02-01 NOTE — Progress Notes (Signed)
eLink Physician-Brief Progress Note Patient Name: Mercedes Thornton DOB: [birth year] MRN: [id number]   Date of Service  02/01/2021  HPI/Events of Note  Patient coughed up some mucous with a slight pink tinge which is now clearing, she is on anti-coagulation, the cough with mucous production is her baseline from home, but the slight pink tinge is new.  eICU Interventions  Pink tinge likely from tiny mucosal end capillary rupture and hopefully is a self limited event. Robitussin PRN cough ordered and patient will be carefully monitored for bleeding.        Nehal Shives U Joncarlo Friberg 02/01/2021, 2:10 AM

## 2021-02-01 NOTE — Progress Notes (Signed)
Discharge orders received, pt for discharge home today. IV D/C with dressing CDI to right wrist. D/C instructions given with verbalized understanding.  Family at bedside to assist pt with discharge. Staff brought pt downstairs via wheelchair.

## 2021-02-01 NOTE — Progress Notes (Signed)
NAME:  Mercedes Thornton, MRN:  [id number], DOB:  [birth year], LOS: 1 ADMISSION DATE:  01/31/2021 CONSULTATION DATE:  01/31/2021 REFERRING MD:  Genevie Cheshire CHIEF COMPLAINT:  Hyperglycemia   History of Present Illness:  75 year old woman who presented to [facility] 10/20 for planned embolization of L ICA aneurysm. PMHx significant for HTN, HLD, CAD, Afib, T2DM (A1C 8.6% 01/2021), COPD, tobacco abuse, bipolar disorder, depression/anxiety.  Patient was initially seen by Dr. 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 She was referred to Dr. Estanislado Pandy for intervention.  Patient underwent embolization of L ICA terminus aneurysm 10/20 with NIR. Procedure was uncomplicated and patient tolerated procedure well. Post-procedure CT Head was negative for ICH. However, patient was noted to be profoundly hyperglycemic to > 500.  PCCM consulted for management of hyperglycemia.  Pertinent Medical History:   Past Medical History:  Diagnosis Date   Allergic rhinitis    Anxiety    Bipolar affective disorder ([place])    COPD (chronic obstructive pulmonary disease) ([place])    Coronary artery disease 02/13/2015   50% RCA otherwise normal   Cough 10/30/2011   Depression    DM (diabetes mellitus) ([facility])    Type II   Hyperlipidemia    Leukocytosis    Mediastinal adenopathy 11/26/2011   Pericardial effusion    Postoperative atrial fibrillation ([facility])    Smoking    SOB (shortness of breath) 10/30/2011   Significant Hospital Events: Including procedures, antibiotic start and stop dates in addition to other pertinent events   10/20 - Admitted for L ICA aneurysm embolization with Dr. Estanislado Pandy. Tolerated procedure well. CBG > 500 post-procedure. A1c 8.6%. PCCM consulted for management. Insulin gtt started. 10/21 - Glucoses significantly improved with insulin gtt overnight. Levemir administered + [place] Insulin.    Interim History / Subjective:  Feeling well overall this morning Sitting up in bed, ordering lunch Mild SOB, patient states this is baseline Did not sleep well overnight Glucoses significantly better post-insulin gtt Possible d/c later today if hitting post-procedure milestones  Objective:  Blood pressure (!) 119/57, pulse 75, temperature 98.6 F (37 C), temperature source Oral, resp. rate (!) 26, height 5\' 4"  (1.626 m), weight 68 kg, SpO2 95 %.        Intake/Output Summary (Last 24 hours) at 02/01/2021 0825 Last data filed at 02/01/2021 0700 Gross per 24 hour  Intake 2019.13 ml  Output 3350 ml  Net -1330.87 ml    Filed Weights   01/30/21 1302 01/31/21 0627  Weight: 68 kg 68 kg   Physical Examination: General: WDWN elderly woman in NAD. HEENT: [place]/AT, anicteric sclera, PERRL, moist mucous membranes. [place] in place. Neuro: Awake, oriented x 4. Responds to verbal stimuli. Following commands consistently. Moves all 4 extremities spontaneously. Strength 4-5/5 in 5 extremities.  CV: RRR, no m/g/r. PULM: Breathing even and unlabored on 2L  (baseline in PM). Lung fields diminished bilaterally. GI: Soft, nontender, nondistended. Normoactive bowel sounds. Extremities: No LE edema noted. Skin: Warm/dry, no rashes.  Resolved Hospital Problem List     Assessment & Plan:   Hyperglycemia History of T2DM, uncontrolled Patient with marked hyperglycemia post-procedure to > 500. Evidence of uncontrolled T2DM with Hemoglobin A1C 8.6% 10/20 (previously 8.3% 06/2015, per daughters at bedside had a more recent A1c that was 6.5%). Home regimen includes metformin and glimepiride. Patient states these medications were stopped by her PCP during her paralysis workup, which doesn't clinically make sense.  Likely needs discussion and adjustment of home medications. - S/p insulin gtt with significant improvement in glucoses overnight - Levemir administered 10/20PM - Diabetes Coordinator consulted,  appreciate assistance - CBGs Q4H - Carb Modified Diet - Likely resume home medications (Amaryl, Metformin) - Possible d/c this afternoon, pending progress  L ICA terminus aneurysm, s/p embolization - Post-procedure management per NIR - Goal SBP < 140 - Cleviprex discontinued - Heparin discontinued - Plavix/ASA   COPD Tobacco abuse Allergic rhinitis - Continue home Trelegy (BIB patient family) - DuoNeb/Albuterol PRN - Pulmonary hygiene - Encourage smoking cessation  CAD HLD - Continue home atorvastatin, metop  Bipolar disorder Depression Anxiety - Continue home Klonopin PRN  Best Practice: (right click and "Reselect all SmartList Selections" daily)   Diet/type: Regular consistency (see orders) - Diabetic/Carb Controlled Diet DVT prophylaxis: systemic heparin - per NIR GI prophylaxis: N/A Lines: Arterial Line Foley:  Yes, and it is no longer needed Code Status:  full code Last date of multidisciplinary goals of care discussion [Per Primary]  Critical care time: N/A   Rhae Lerner [facility] 02/01/21 8:25 AM  Please see [email or web address] for pager details.  From 7A-7P if no response, please call [phone] After hours, please call ELink [phone]

## 2021-02-01 NOTE — Progress Notes (Addendum)
Inpatient Diabetes Program Recommendations  AACE/ADA: New Consensus Statement on Inpatient Glycemic Control   Target Ranges:  Prepandial:   less than 140 mg/dL      Peak postprandial:   less than 180 mg/dL (1-2 hours)      Critically ill patients:  140 - 180 mg/dL  Results for Mercedes Thornton, Mercedes Thornton (MRN [id number]) as of 02/01/2021 07:59  Ref. Range 02/01/2021 05:19  Glucose Latest Ref Range: 70 - 99 mg/dL 149 (H)   Results for Mercedes Thornton, Mercedes Thornton (MRN [id number]) as of 02/01/2021 07:59  Ref. Range 01/31/2021 06:31 01/31/2021 12:43 01/31/2021 16:22 01/31/2021 16:34 01/31/2021 17:29 01/31/2021 18:03 01/31/2021 18:36 01/31/2021 19:08 01/31/2021 19:50 01/31/2021 20:58 01/31/2021 22:03 01/31/2021 23:05  Glucose-Capillary Latest Ref Range: 70 - 99 mg/dL 186 (H) 210 (H) 503 (HH) 500 (H) 495 (H) 469 (H) 431 (H) 438 (H) 337 (H) 284 (H) 241 (H) 158 (H)   Results for Mercedes Thornton, Mercedes Thornton (MRN [id number]) as of 02/01/2021 07:59  Ref. Range 01/31/2021 06:27  Hemoglobin A1C Latest Ref Range: 4.8 - 5.6 % 8.6 (H)   Review of Glycemic Control  Diabetes history: DM2 Outpatient Diabetes medications: Amaryl 4 mg daily, Metformin 500 mg daily Current orders for Inpatient glycemic control: Levemir 10 units Q12H, Novolog 2-6 units Q4H  Inpatient Diabetes Program Recommendations:    Insulin: Noted IV insulin was ordered yesterday and patient has been transitioned to SQ insulin. Please consider decreasing Levemir to 10 units QHS (to start at bedtime tonight).  HbgA1C: Current A1C 8.6% on 01/31/21 indicating an average glucose of 200 mg/dl over the past 2-3 months.  NOTE: Noted consult for diabetes coordinator. Chart reviewed. Patient developed hyperglycemia following procedure on 01/31/21. Patient was given Decadron 4 mg at 9:10 on 01/31/21 which contributed to hyperglycemia. IV insulin was started 01/31/21 at 17:29 and patient was transitioned to SQ insulin during the night; received Levemir 10 units at 00:23 today. Lab  glucose 149 mg/dl today at 5:19 am.  Per note by Mercedes Thornton, [place] on 01/31/21 "per daughters at bedside had a more recent A1c that was 6.5%). Home regimen includes metformin and glimepiride. Patient states these medications were stopped by her PCP during her paralysis workup."  Will plan to follow up with patient today.  Addendum 02/01/21@12 :55-Spoke with patient and her daughter regarding DM control and recent changes with DM medications. Patient states that since June when she had an issue with paralysis her medications have been changed. She states that her  PCP had her stop taking Amaryl and Metformin for a few weeks and then she was told to restart them and then told to stop them again. Patient reports that she has been consistently taking Amaryl and Metformin for the week prior to admission. Patient also notes that she has some dental work and was not able to eat much and lost 30 pounds in the past 6 months. Patient checks glucose once a day when she remembers; notes she actually checks about 3 times a week on average. Patient states her glucose is "all over, up and down" and notes it may range from 40's-500's mg/dl. She states it is typically 200-300's which is more of her normal. Patient states that she keeps juice and crackers and peanut butter on bedside table in case her glucose gets low. Discussed importance of DM control and potential complications from hyperglycemia as well as hypoglycemia.  Discussed Amaryl, Metformin, and how they work; discussed how weight loss can also impact glucose control and may require medication adjustments.  Patient states that she does not think she is having hypoglycemia very often. She has not checked glucose when transient paralysis episodes have occurred in the past few months. Discussed hypoglycemia along with treatment. Inquired about FreeStyle Libre2 knowledge and patient is not familiar with it. Discussed FreeStyle Libre2 and how it works, explained that it has alarms  that will alarm if glucose is low or high (depends on values set in alarms). Patient is interested in using [employer] and order provided by S. Ayesha Rumpf, PA to provide patient with sample sensors and reader. Explained how to use FreeStyle Libre2 and assisted patient to apply sensor to back of upper left arm. Patient downloaded FreeStyle Libre2 app on her cell phone and plans to use the app to check glucose. Patient states she plans to make an appointment with PCP next week for follow up. Asked that she show PCP the app so provider can use the reports to make adjustments with DM medications if needed. Encouraged patient to reach out to PCP if she has any issues with hypoglycemia at all for recommendations for DM medication changes. Patient verbalized understanding of information and states she has no questions at this time.    Thanks, Barnie Alderman, RN, MSN, CDE Diabetes Coordinator Inpatient Diabetes Program [phone] (Team Pager from 8am to 5pm)

## 2021-02-05 ENCOUNTER — Telehealth (HOSPITAL_COMMUNITY): Payer: Self-pay

## 2021-02-05 NOTE — Telephone Encounter (Signed)
Called to schedule 2 wk f/u, no answer, no vm, phone kept ringing. AW

## 2021-02-15 ENCOUNTER — Encounter (HOSPITAL_COMMUNITY): Payer: Self-pay

## 2021-02-15 ENCOUNTER — Ambulatory Visit (HOSPITAL_COMMUNITY): Admission: RE | Admit: 2021-02-15 | Payer: Medicare PPO | Source: Ambulatory Visit

## 2021-02-19 ENCOUNTER — Telehealth: Payer: Self-pay | Admitting: Student

## 2021-02-19 ENCOUNTER — Ambulatory Visit (HOSPITAL_COMMUNITY)
Admission: RE | Admit: 2021-02-19 | Discharge: 2021-02-19 | Disposition: A | Discharge status: Home / Self Care | Payer: Medicare PPO | Source: Ambulatory Visit | Attending: Student | Admitting: Student

## 2021-02-19 ENCOUNTER — Other Ambulatory Visit: Payer: Self-pay

## 2021-02-19 DIAGNOSIS — I671 Cerebral aneurysm, nonruptured: Secondary | ICD-10-CM

## 2021-02-19 MED ORDER — CLOPIDOGREL BISULFATE 75 MG PO TABS: 75.0000 mg | ORAL_TABLET | Freq: Every day | ORAL | 2 refills | Status: DC

## 2021-02-19 NOTE — Telephone Encounter (Signed)
Plavix 75 mg PO daily e-prescribed to [facility] in [place]. 30 day supply with 2 refills. Patient and daughter aware the patient also needs to take 81 mg ASA daily.   Soyla Dryer, [place] [phone] 02/19/2021, 1:04 PM

## 2021-02-21 ENCOUNTER — Telehealth: Payer: Self-pay | Admitting: Cardiology

## 2021-02-21 DIAGNOSIS — I4891 Unspecified atrial fibrillation: Secondary | ICD-10-CM

## 2021-02-21 DIAGNOSIS — I9789 Other postprocedural complications and disorders of the circulatory system, not elsewhere classified: Secondary | ICD-10-CM

## 2021-02-21 HISTORY — PX: IR RADIOLOGIST EVAL & MGMT: IMG5224

## 2021-02-21 NOTE — Telephone Encounter (Signed)
Patient states her neurologist wants her to wear a heart monitor for a month since she was just in the hospital.

## 2021-02-21 NOTE — Telephone Encounter (Signed)
Patient reports that she was hospitalized at [facility] last week because she had a mild stroke. She was discharged on Sunday and is followed by her neurologist who recommended that she wear a heart monitor for 30 days. She is unsure whether she had an arrhythmia during her hospitalization, but her neurologist told her to reach out to her cardiologist to get a heart monitor. Advised patient that I would check with Dr. Radford Pax and let her know.  She states that she is currently staying at her sister's house so monitor needs to be sent there. Address is [address] [place], [place] [address]

## 2021-02-22 ENCOUNTER — Encounter: Payer: Self-pay | Admitting: Radiology

## 2021-02-22 NOTE — Telephone Encounter (Signed)
Monitor was mailled to sisters home.

## 2021-02-22 NOTE — Progress Notes (Signed)
Enrolled patient for a 30 day [employer] Event Monitor to be mailed to patients home  

## 2021-02-22 NOTE — Telephone Encounter (Signed)
Event monitor has been ordered.  

## 2021-02-27 ENCOUNTER — Telehealth: Payer: Self-pay | Admitting: Internal Medicine

## 2021-02-27 MED ORDER — CLOPIDOGREL BISULFATE 75 MG PO TABS: 75.0000 mg | ORAL_TABLET | Freq: Every day | ORAL | 2 refills | Status: AC

## 2021-02-27 NOTE — Progress Notes (Signed)
Patient ID: Mercedes Thornton, female   DOB: 10-12-1945, 75 y.o.   MRN: [id number] Plavix 75 mg PO daily [facility] [place]. 30 day supply with 2 refills. Patient and daughter aware the patient also needs to take 81 mg ASA daily.   Narda Rutherford, AGNP-BC 02/27/2021, 9:59 AM

## 2021-04-13 ENCOUNTER — Other Ambulatory Visit: Payer: Self-pay | Admitting: Cardiology

## 2021-04-13 DIAGNOSIS — E7800 Pure hypercholesterolemia, unspecified: Secondary | ICD-10-CM

## 2021-04-16 NOTE — Telephone Encounter (Signed)
Contacted the patient to schedule a follow up appointment with Dr Radford Pax. The patient stated she is in [place] and just had surgery. She states she will contact the office for an appointment once she returns home.

## 2021-04-17 ENCOUNTER — Telehealth (HOSPITAL_COMMUNITY): Payer: Self-pay

## 2021-04-17 NOTE — Telephone Encounter (Signed)
Pt called to find out when she will be able to drive again. I have sent a message to our PA to advise. AW

## 2021-04-23 ENCOUNTER — Telehealth (HOSPITAL_COMMUNITY): Payer: Self-pay

## 2021-04-23 NOTE — Telephone Encounter (Signed)
Returned pt's call, per Dr. Estanislado Pandy, she is able to drive as long as she isn't having any symptoms. She will have her next f/u MRI in March. Pt agreed. AW

## 2021-04-30 ENCOUNTER — Other Ambulatory Visit: Payer: Self-pay | Admitting: Cardiology

## 2021-04-30 DIAGNOSIS — E7800 Pure hypercholesterolemia, unspecified: Secondary | ICD-10-CM

## 2021-05-17 ENCOUNTER — Other Ambulatory Visit: Payer: Self-pay | Admitting: Cardiology

## 2021-05-17 DIAGNOSIS — I4891 Unspecified atrial fibrillation: Secondary | ICD-10-CM

## 2021-05-17 DIAGNOSIS — I9789 Other postprocedural complications and disorders of the circulatory system, not elsewhere classified: Secondary | ICD-10-CM

## 2021-05-25 ENCOUNTER — Other Ambulatory Visit: Payer: Self-pay | Admitting: Cardiology

## 2021-05-25 DIAGNOSIS — E7800 Pure hypercholesterolemia, unspecified: Secondary | ICD-10-CM

## 2021-06-26 ENCOUNTER — Other Ambulatory Visit (HOSPITAL_COMMUNITY): Payer: Self-pay | Admitting: Interventional Radiology

## 2021-06-26 ENCOUNTER — Telehealth (HOSPITAL_COMMUNITY): Payer: Self-pay

## 2021-06-26 DIAGNOSIS — I671 Cerebral aneurysm, nonruptured: Secondary | ICD-10-CM

## 2021-06-26 NOTE — Telephone Encounter (Signed)
Called to schedule mri, no answer, left vm. AW 

## 2021-06-29 ENCOUNTER — Other Ambulatory Visit: Payer: Self-pay | Admitting: Cardiology

## 2021-06-29 DIAGNOSIS — I4891 Unspecified atrial fibrillation: Secondary | ICD-10-CM

## 2021-07-12 ENCOUNTER — Ambulatory Visit (HOSPITAL_COMMUNITY)
Admission: RE | Admit: 2021-07-12 | Discharge: 2021-07-12 | Disposition: A | Discharge status: Home / Self Care | Payer: Medicare PPO | Source: Ambulatory Visit | Attending: Interventional Radiology | Admitting: Interventional Radiology

## 2021-07-12 ENCOUNTER — Other Ambulatory Visit (HOSPITAL_COMMUNITY): Payer: Self-pay | Admitting: Interventional Radiology

## 2021-07-12 DIAGNOSIS — I671 Cerebral aneurysm, nonruptured: Secondary | ICD-10-CM

## 2021-07-17 ENCOUNTER — Other Ambulatory Visit: Payer: Self-pay | Admitting: Cardiology

## 2021-07-17 DIAGNOSIS — E7800 Pure hypercholesterolemia, unspecified: Secondary | ICD-10-CM

## 2021-07-17 DIAGNOSIS — I4891 Unspecified atrial fibrillation: Secondary | ICD-10-CM

## 2021-07-22 ENCOUNTER — Telehealth (HOSPITAL_COMMUNITY): Payer: Self-pay

## 2021-07-22 NOTE — Telephone Encounter (Signed)
Pt agreed to f/u in 6 months with an mra. AW 

## 2021-07-22 NOTE — Telephone Encounter (Signed)
Called pt regarding recent imaging, no answer, left vm. AW  

## 2021-08-01 ENCOUNTER — Telehealth: Payer: Self-pay | Admitting: Pulmonary Disease

## 2021-08-01 MED ORDER — TRELEGY ELLIPTA 100-62.5-25 MCG/ACT IN AEPB: 1.0000 | INHALATION_SPRAY | Freq: Every day | RESPIRATORY_TRACT | 0 refills | Status: DC

## 2021-08-01 NOTE — Telephone Encounter (Signed)
Called and spoke with patient. Advised her that she needed an appointment to continue getting refills. Made patient an appointment and sent her a refill to the pharmacy to last her until her appointment. Patient verified pharmacy. Nothing further needed.  ?

## 2021-08-02 ENCOUNTER — Other Ambulatory Visit: Payer: Self-pay | Admitting: Cardiology

## 2021-08-02 DIAGNOSIS — I4891 Unspecified atrial fibrillation: Secondary | ICD-10-CM

## 2021-08-05 ENCOUNTER — Encounter: Payer: Self-pay | Admitting: Physician Assistant

## 2021-08-05 NOTE — Progress Notes (Signed)
? ?Cardiology Office Note   ? ?Date:  08/06/2021  ? ?ID:  Mercedes Thornton, DOB Nov 25, 1945, MRN [id number] ? ?PCP:  Antonietta Jewel, MD  ?Cardiologist:  Fransico Him, MD  ?Electrophysiologist:  None  ? ?Chief Complaint: f/u CAD, pericardial effusion ? ?History of Present Illness:  ? ?Mercedes Thornton is a 76 y.o. female with history of tobacco abuse, COPD on chronic O2 QHS per pt, dyslipidemia, DM, bipolar disorder, anxiety, h/o post-op AF/diastolic CHF/NSVT/moderate pericardial effusion after appendectomy in 2016, nonobstructive CAD by cath 2016, CVA/cerebral aneurysm who presents for overdue follow-up, last seen 05/2020.  ? ?The majority of her cardiac history was in 2016 with events above after appendectomy with post-op course notable for post-op AF, diastolic CHF, NSVT, and pericardial effusion. She has not required anticoagulation due to no known AF recurrence. Pericardial effusion was moderate by echo at that time but did not require intervention - it has been in the trivial to small range since that time. She also underwent cath 2016 showed 55% prox RCA. Event monitor 2017 was unrevealing. In 01/2021 she underwent Lt common  and Lt Internal carotid arteriograms followed by embolization of Lt ICA terminus aneurysm. She was discharged with ASA and Plavix but stopped Plavix at the 2 week mark. She was admitted 02/2021 with stroke at outside facility, possible cardioembolic versus trauma to left carotid terminus device. She called our office and event monitor was ordered but do not see completed. She was discharged on ASA only from that admission although has since been placed back on Plavix per her med list. Of note, Hgb during that admission was 8.2 (previously 10-12 range). Outside echo from that admission showed EF 65%, G1DD, normal RV, trivial MS, small pericardial effusion. ? ?She is seen back for follow-up today overall feeling well without complaints of chest pain, dyspnea, palpitations, orthopnea, or edema. She  did have a spontaneous nosebleed yesterday that stopped quickly. She did not pursue the event monitor that was ordered post stroke citing that she has been too busy going back and forth between here and [place] where her daughter is. She continues to smoke. ? ? ?Labwork independently reviewed: ?02/2021 Hgb 8.2, K 4.1, Cr 1.07, TSH 6.093 but fT4 was normal, LDL 51, trig 70 ?09/2020 LFTs wnl ? ?Cardiology Studies:  ? ?Studies reviewed are outlined and summarized above. Reports included below if pertinent.  ? ?2d echo 08/2020 ? ? 1. Left ventricular ejection fraction, by estimation, is 60 to 65%. The  ?left ventricle has normal function. The left ventricle has no regional  ?wall motion abnormalities. There is mild left ventricular hypertrophy of  ?the basal-septal segment. Left  ?ventricular diastolic parameters are consistent with Grade I diastolic  ?dysfunction (impaired relaxation). Elevated left atrial pressure.  ? 2. Right ventricular systolic function is normal. The right ventricular  ?size is normal.  ? 3. The mitral valve is normal in structure. No evidence of mitral valve  ?regurgitation. No evidence of mitral stenosis.  ? 4. The aortic valve is tricuspid. Aortic valve regurgitation is not  ?visualized. Mild aortic valve sclerosis is present, with no evidence of  ?aortic valve stenosis.  ? 5. The inferior vena cava is normal in size with greater than 50%  ?respiratory variability, suggesting right atrial pressure of 3 mmHg.  ? ?Monitor 2017 ?sinus bradycardia, normal sinus rhythm, sinus tachycardia with heart rate ranging from 47 to 121 bpm. ?  ? ? ?Cath 2016 ?Angiographically normal left coronary system with moderate disease in the  proximal RCA ?Prox RCA lesion, 55% stenosed. ?The left ventricular systolic function is normal. ?Normal LVEDP  ? ? ?Past Medical History:  ?Diagnosis Date  ? Allergic rhinitis   ? Anemia   ? Anxiety   ? Bipolar affective disorder ([place])   ? Cerebral aneurysm   ? COPD (chronic  obstructive pulmonary disease) ([place])   ? Coronary artery disease 02/13/2015  ? 50% RCA otherwise normal  ? Cough 10/30/2011  ? CVA (cerebral vascular accident) [facility])   ? Depression   ? DM (diabetes mellitus) ([place])   ? Type II  ? Hyperlipidemia   ? Leukocytosis   ? Mediastinal adenopathy 11/26/2011  ? Pericardial effusion   ? Postoperative atrial fibrillation ([facility])   ? Smoking   ? SOB (shortness of breath) 10/30/2011  ? ? ?Past Surgical History:  ?Procedure Laterality Date  ? CARDIAC CATHETERIZATION N/A 02/15/2015  ? Procedure: Left Heart Cath and Coronary Angiography;  Surgeon: Leonie Man, MD;  Location: [facility];  Service: Cardiovascular;  Laterality: N/A;  ? CATARACT EXTRACTION    ? CESAREAN SECTION    ? COLONOSCOPY  2008  ? negative per pt's report, with [facility]   ? IR 3D INDEPENDENT WKST  01/31/2021  ? IR ANGIO INTRA EXTRACRAN SEL COM CAROTID INNOMINATE BILAT MOD SED  01/11/2021  ? IR ANGIO VERTEBRAL SEL VERTEBRAL BILAT MOD SED  01/11/2021  ? IR CT HEAD LTD  01/31/2021  ? IR RADIOLOGIST EVAL & MGMT  01/08/2021  ? IR RADIOLOGIST EVAL & MGMT  02/21/2021  ? IR TRANSCATH/EMBOLIZ  01/31/2021  ? IR US GUIDE VASC ACCESS RIGHT  01/11/2021  ? IR US GUIDE VASC ACCESS RIGHT  01/31/2021  ? LAPAROSCOPIC APPENDECTOMY N/A 01/31/2015  ? Procedure: LYSIS OF ADHESIONS /APPENDECTOMY LAPAROSCOPIC ;  Surgeon: Alphonsa Overall, MD;  Location: [facility];  Service: General;  Laterality: N/A;  ? RADIOLOGY WITH ANESTHESIA N/A 01/31/2021  ? Procedure: EMBOLIZATION;  Surgeon: Luanne Bras, MD;  Location: [facility];  Service: Radiology;  Laterality: N/A;  ? ? ?Current Medications: ?Current Meds  ?Medication Sig  ? albuterol (VENTOLIN HFA) 108 (90 Base) MCG/ACT inhaler Inhale 2 puffs into the lungs every 6 (six) hours as needed for wheezing.  ? aspirin 81 MG tablet Take 81 mg by mouth daily.  ? atorvastatin (LIPITOR) 40 MG tablet TAKE 1 TABLET (40 MG TOTAL) BY MOUTH DAILY. NEED FOLLOW UP APPOINTMENT [phone]  ? Cholecalciferol  (VITAMIN D3) 3000 UNITS TABS Take 3,000 Units by mouth daily.  ? CINNAMON PO Take 2,000 mg by mouth daily.  ? clonazePAM (KLONOPIN) 1 MG tablet Take 1 mg by mouth See admin instructions. Take '1mg'$  every AM then twice a day as needed for anxiety.  ? clopidogrel (PLAVIX) 75 MG tablet Take 75 mg by mouth daily.  ? fexofenadine (ALLEGRA) 180 MG tablet Take 180 mg by mouth daily.  ? fluticasone (FLONASE) 50 MCG/ACT nasal spray Place 1 spray into both nostrils daily as needed for allergies.   ? Fluticasone-Umeclidin-Vilant (TRELEGY ELLIPTA) 100-62.5-25 MCG/ACT AEPB Inhale 1 puff into the lungs daily.  ? Fluticasone-Umeclidin-Vilant (TRELEGY ELLIPTA) 100-62.5-25 MCG/INH AEPB TAKE 1 PUFF BY MOUTH EVERY DAY  ? glimepiride (AMARYL) 4 MG tablet Take 4 mg by mouth daily with breakfast.  ? metFORMIN (GLUCOPHAGE) 500 MG tablet Take 500 mg by mouth daily with breakfast.  ? metoprolol tartrate (LOPRESSOR) 25 MG tablet TAKE 1 TABLET BY MOUTH TWICE A DAY  ? Multiple Vitamins-Minerals (WOMENS MULTIVITAMIN PLUS PO) Take 1 tablet  by mouth daily.   ? OXYGEN Inhale 2 Units into the lungs at bedtime.  ? Polyethyl Glycol-Propyl Glycol (SYSTANE OP) Apply 1 drop to eye every 6 (six) hours as needed (dry eyes).  ? vitamin C (ASCORBIC ACID) 500 MG tablet Take 500 mg by mouth daily.  ? ?  ? ?Allergies:   Penicillins  ? ?Social History  ? ?Socioeconomic History  ? Marital status: Divorced  ?  Spouse name: Not on file  ? Number of children: 2  ? Years of education: Not on file  ? Highest education level: Not on file  ?Occupational History  ?  Comment: retired grade school/middle school teacher  ?Tobacco Use  ? Smoking status: Former  ?  Packs/day: 0.50  ?  Years: 51.00  ?  Pack years: 25.50  ?  Types: Cigarettes  ? Smokeless tobacco: Never  ? Tobacco comments:  ?  half pack daily-07/19/2020-[facility]  ?  Pt states she has not smoked since being diagnosed with the aneurysm-01/30/21- [facility]  ?Vaping Use  ? Vaping Use: Never used  ?Substance and Sexual Activity   ? Alcohol use: Not Currently  ? Drug use: No  ? Sexual activity: Not on file  ?Other Topics Concern  ? Not on file  ?Social History Narrative  ? Not on file  ? ?Social Determinants of Health  ? ?Financial Res

## 2021-08-06 ENCOUNTER — Ambulatory Visit: Payer: Medicare PPO | Admitting: Physician Assistant

## 2021-08-06 ENCOUNTER — Encounter: Payer: Self-pay | Admitting: Physician Assistant

## 2021-08-06 VITALS — BP 130/60 | HR 63 | Ht 63.0 in | Wt 155.8 lb

## 2021-08-06 DIAGNOSIS — I4729 Other ventricular tachycardia: Secondary | ICD-10-CM

## 2021-08-06 DIAGNOSIS — I480 Paroxysmal atrial fibrillation: Secondary | ICD-10-CM | POA: Diagnosis not present

## 2021-08-06 DIAGNOSIS — I2510 Atherosclerotic heart disease of native coronary artery without angina pectoris: Secondary | ICD-10-CM | POA: Diagnosis not present

## 2021-08-06 DIAGNOSIS — D649 Anemia, unspecified: Secondary | ICD-10-CM

## 2021-08-06 DIAGNOSIS — I5032 Chronic diastolic (congestive) heart failure: Secondary | ICD-10-CM

## 2021-08-06 DIAGNOSIS — Z8673 Personal history of transient ischemic attack (TIA), and cerebral infarction without residual deficits: Secondary | ICD-10-CM | POA: Diagnosis not present

## 2021-08-06 DIAGNOSIS — I3139 Other pericardial effusion (noninflammatory): Secondary | ICD-10-CM

## 2021-08-06 NOTE — Patient Instructions (Signed)
Medication Instructions:  ?Your physician recommends that you continue on your current medications as directed. Please refer to the Current Medication list given to you today. ?*If you need a refill on your cardiac medications before your next appointment, please call your pharmacy* ? ? ?Lab Work: ?None ordered  ?If you have labs (blood work) drawn today and your tests are completely normal, you will receive your results only by: ?MyChart Message (if you have MyChart) OR ?A paper copy in the mail ?If you have any lab test that is abnormal or we need to change your treatment, we will call you to review the results. ? ? ?Testing/Procedures: ?PLEASE CONTACT THE OFFICE WHEN YOUR READY TO PERSUE A HEART MONITOR ? ? ?Follow-Up: ?At [facility], you and your health needs are our priority.  As part of our continuing mission to provide you with exceptional heart care, we have created designated Provider Care Teams.  These Care Teams include your primary Cardiologist (physician) and Advanced Practice Providers (APPs -  Physician Assistants and Nurse Practitioners) who all work together to provide you with the care you need, when you need it. ? ?We recommend signing up for the patient portal called "MyChart".  Sign up information is provided on this After Visit Summary.  MyChart is used to connect with patients for Virtual Visits (Telemedicine).  Patients are able to view lab/test results, encounter notes, upcoming appointments, etc.  Non-urgent messages can be sent to your provider as well.   ?To learn more about what you can do with MyChart, go to [email or web address].   ? ?Your next appointment:   ?1 year(s) ? ?The format for your next appointment:   ?In Person ? ?Provider:   ?Fransico Him, MD   ? ? ?Other Instructions ? ? ?Important Information About Sugar ? ? ? ? ?  ?

## 2021-08-08 ENCOUNTER — Other Ambulatory Visit: Payer: Self-pay | Admitting: Cardiology

## 2021-08-08 ENCOUNTER — Ambulatory Visit: Payer: Medicare PPO | Admitting: Pulmonary Disease

## 2021-08-08 ENCOUNTER — Encounter: Payer: Self-pay | Admitting: Pulmonary Disease

## 2021-08-08 VITALS — BP 120/66 | HR 72 | Temp 98.7°F | Wt 156.0 lb

## 2021-08-08 DIAGNOSIS — J432 Centrilobular emphysema: Secondary | ICD-10-CM

## 2021-08-08 DIAGNOSIS — E7800 Pure hypercholesterolemia, unspecified: Secondary | ICD-10-CM

## 2021-08-08 DIAGNOSIS — Z720 Tobacco use: Secondary | ICD-10-CM | POA: Diagnosis not present

## 2021-08-08 DIAGNOSIS — I4891 Unspecified atrial fibrillation: Secondary | ICD-10-CM

## 2021-08-08 MED ORDER — TRELEGY ELLIPTA 100-62.5-25 MCG/ACT IN AEPB: 1.0000 | INHALATION_SPRAY | Freq: Every day | RESPIRATORY_TRACT | 11 refills | Status: DC

## 2021-08-08 NOTE — Progress Notes (Signed)
? ?'@Patient'$  ID: Mercedes Thornton, female    DOB: 24-Jan-1946, 76 y.o.   MRN: [id number] ? ?Chief Complaint  ?Patient presents with  ? Follow-up  ?  Pt is here for an over due follow up. Pt states she feels an improvement with her breathing. Pt is on albuterol and low dose trelegy.   ? ? ?Referring provider: ?Antonietta Jewel, MD ? ?HPI:  ? ?76 y.o.whom we are seeing in follow-up for COPD.  Discharge summary 01/2021 for cerebral aneurysm reviewed. ? ?Doing fine.  Reports Trelegy continues to be very beneficial.  In the last few weeks sputum is a little thicker, a bit wider appearing.  Otherwise no issues.  No fever or chills.  No weight loss.  No worsening dyspnea etc. ? ?HPI at initial visit: ?Overall, patient doing well.  She has baseline dyspnea on exertion.  Insurance getting better or worse.  Relatively stable.  Worse with inclines or stairs.  Has occasional productive cough.  No rhyme or reason to this.  She continues to smoke, is trying to cut down.  Has tried multiple cessation attempts in the past.  Smoking cessation clinic, nicotine etc.  She is precontemplative at this time.  She has had no exacerbations of her COPD.  No prednisone use.  She reports rare if ever using her albuterol rescue inhaler.  She continues Trelegy 1 puff daily every day.  She says this is quite helpful.  She notices a big difference in this compared to her prior inhaler regimen. ? ?Most recent CT chest lung cancer screening reviewed with patient with 2 tiny sub-3 mm nodules, emphysema otherwise clear on my interpretation.  Most recent PFTs were 07/2019, which on my interpretation demonstrated moderate fixed obstruction on spirometry. ? ? ?Questionaires / Pulmonary Flowsheets:  ? ?ACT:  ?   ? View : No data to display.  ?  ?  ?  ? ? ?MMRC: ?   ? View : No data to display.  ?  ?  ?  ? ? ?Epworth:  ?   ? View : No data to display.  ?  ?  ?  ? ? ?Tests:  ? ?FENO:  ?No results found for: NITRICOXIDE ? ?PFT: ? ?  Latest Ref Rng & Units 08/04/2019   ?  3:53 PM 04/17/2015  ?  3:08 PM  ?PFT Results  ?FVC-Pre L 1.77   1.89    ?FVC-Predicted Pre % 74   75    ?FVC-Post L 1.79     ?FVC-Predicted Post % 75     ?Pre FEV1/FVC % % 66   73    ?Post FEV1/FCV % % 66     ?FEV1-Pre L 1.16   1.39    ?FEV1-Predicted Pre % 62   71    ?FEV1-Post L 1.18     ?Personally reviewed and interpreted as mild fixed obstruction ? ?WALK:  ? ?  08/19/2016  ?  3:47 PM 03/01/2015  ?  5:10 PM  ?SIX MIN WALK  ?Supplimental Oxygen during Test? (L/min) No No  ?Tech Comments: pt walked a moderate pace, did not complete laps 2 and 3 at pt request- pt states she has errands to run after appt and did not wish to overexert herself.  Pt's lowest ambulatory sat was 90 on RA. Pt walked at a slower pace with no breaks.  ? ? ?Imaging: ?Reviewed as per  EMR and discussion in this note ?MR ANGIO HEAD WO CONTRAST ? ?Result Date: 07/13/2021 ?CLINICAL  DATA:  Follow-up aneurysm. EXAM: MRI HEAD WITHOUT CONTRAST MRA HEAD WITHOUT CONTRAST TECHNIQUE: Multiplanar, multi-echo pulse sequences of the brain and surrounding structures were acquired without intravenous contrast. Angiographic images of the Circle of Willis were acquired using MRA technique without intravenous contrast. COMPARISON:  12/20/2020 FINDINGS: MRI HEAD FINDINGS Brain: No acute infarction, hemorrhage, hydrocephalus, extra-axial collection or mass lesion. Chronic small vessel ischemia in the hemispheric white matter with chronic lacunar infarct at the left caudate head. Chronic small vessel ischemia in the pons and small remote right cerebellar infarct. Vascular: See below Skull and upper cervical spine: Normal marrow signal Sinuses/Orbits: Negative MRA HEAD FINDINGS Anterior circulation: Interval left ICA aneurysm treatment with absent signal of the proximal left M1 segment, but robust downstream blood flow, presumably artifact. When allowing for lenticulostriate flow, no convincing residual aneurysm. Posterior circulation: No branch occlusion, beading,  flow limiting stenosis, or aneurysm. Strong left vertebral artery dominance. IMPRESSION: 1. Flow gap at the left M1 segment, presumably hardware artifact. No evidence of residual aneurysm. 2. Stable appearance of the brain. Electronically Signed   By: Jorje Guild M.D.   On: 07/13/2021 09:49  ? ?MR BRAIN WO CONTRAST ? ?Result Date: 07/13/2021 ?CLINICAL DATA:  Follow-up aneurysm. EXAM: MRI HEAD WITHOUT CONTRAST MRA HEAD WITHOUT CONTRAST TECHNIQUE: Multiplanar, multi-echo pulse sequences of the brain and surrounding structures were acquired without intravenous contrast. Angiographic images of the Circle of Willis were acquired using MRA technique without intravenous contrast. COMPARISON:  12/20/2020 FINDINGS: MRI HEAD FINDINGS Brain: No acute infarction, hemorrhage, hydrocephalus, extra-axial collection or mass lesion. Chronic small vessel ischemia in the hemispheric white matter with chronic lacunar infarct at the left caudate head. Chronic small vessel ischemia in the pons and small remote right cerebellar infarct. Vascular: See below Skull and upper cervical spine: Normal marrow signal Sinuses/Orbits: Negative MRA HEAD FINDINGS Anterior circulation: Interval left ICA aneurysm treatment with absent signal of the proximal left M1 segment, but robust downstream blood flow, presumably artifact. When allowing for lenticulostriate flow, no convincing residual aneurysm. Posterior circulation: No branch occlusion, beading, flow limiting stenosis, or aneurysm. Strong left vertebral artery dominance. IMPRESSION: 1. Flow gap at the left M1 segment, presumably hardware artifact. No evidence of residual aneurysm. 2. Stable appearance of the brain. Electronically Signed   By: Jorje Guild M.D.   On: 07/13/2021 09:49   ? ? ?Lab Results: ?Reviewed, no anemia in 2017 ?CBC ?   ?Component Value Date/Time  ? WBC 16.4 (H) 02/01/2021 0519  ? RBC 3.16 (L) 02/01/2021 0519  ? HGB 8.7 (L) 02/01/2021 0519  ? HGB 13.8 08/23/2015 1313  ?  HCT 27.1 (L) 02/01/2021 0519  ? HCT 42.0 08/23/2015 1313  ? PLT 293 02/01/2021 0519  ? PLT 262 08/23/2015 1313  ? MCV 85.8 02/01/2021 0519  ? MCV 88.2 08/23/2015 1313  ? MCH 27.5 02/01/2021 0519  ? MCHC 32.1 02/01/2021 0519  ? RDW 17.7 (H) 02/01/2021 0519  ? RDW 15.0 (H) 08/23/2015 1313  ? LYMPHSABS 1.4 02/01/2021 0519  ? LYMPHSABS 3.1 08/23/2015 1313  ? MONOABS 1.4 (H) 02/01/2021 0519  ? MONOABS 1.0 (H) 08/23/2015 1313  ? EOSABS 0.1 02/01/2021 0519  ? EOSABS 0.2 08/23/2015 1313  ? BASOSABS 0.1 02/01/2021 0519  ? BASOSABS 0.1 08/23/2015 1313  ? ? ?BMET ?   ?Component Value Date/Time  ? NA 138 02/01/2021 0519  ? NA 143 08/23/2015 1313  ? K 3.9 02/01/2021 0519  ? K 3.7 08/23/2015 1313  ? CL 112 (H) 02/01/2021 0519  ?  CL 105 06/02/2012 1140  ? CO2 21 (L) 02/01/2021 0519  ? CO2 27 08/23/2015 1313  ? GLUCOSE 149 (H) 02/01/2021 0519  ? GLUCOSE 175 (H) 08/23/2015 1313  ? GLUCOSE 89 06/02/2012 1140  ? BUN 22 02/01/2021 0519  ? BUN 9.8 08/23/2015 1313  ? CREATININE 1.00 02/01/2021 0519  ? CREATININE 0.8 08/23/2015 1313  ? CALCIUM 8.2 (L) 02/01/2021 0519  ? CALCIUM 9.7 08/23/2015 1313  ? GFRNONAA 59 (L) 02/01/2021 0519  ? GFRAA >60 02/16/2015 0523  ? ? ?BNP ?   ?Component Value Date/Time  ? BNP 487.5 (H) 02/01/2015 1530  ? ? ?ProBNP ?No results found for: PROBNP ? ?Specialty Problems   ? ?  ? Pulmonary Problems  ? Cough  ? SOB (shortness of breath)  ? Lung nodule  ? Chronic respiratory failure ([place])  ?  ONO 03/15/15 + desats >set up for 2l/m At bedtime   ?05/2015 Polysomnogram> no sleep apnea, significant desaturation ? ? ?  ?  ? Centrilobular emphysema ([place])  ?  04/2015 PFT> didn't meet ATS criteria but flow volume loop was worrisome for airflow obstruction ?08/2015 Spirometry > consistent with airflow obstruction but ratio normal, FEV1 1.39 L (71% predicted), FVC 1.89 L (75% predicted) ? ?  ?  ? ? ?Allergies  ?Allergen Reactions  ? Penicillins Hives and Swelling  ?  Has patient had a PCN reaction causing immediate rash,  facial/tongue/throat swelling, SOB or lightheadedness with hypotension: Yes ?Has patient had a PCN reaction causing severe rash involving mucus membranes or skin necrosis: Yes ?Has patient had a PCN reaction th

## 2021-08-08 NOTE — Patient Instructions (Signed)
Neck to see you again ? ?I refilled the Trelegy, should have enough refills for 1 year ? ?We do not have COVID-vaccine, I recommend make an appoint with [facility] to get [facility] booster if you desire ? ?I will send a message to Eric Form, NP getting back on track with the yearly CT scan for lung cancer screening. ? ?Return to clinic in 1 year or sooner as needed with Dr. Silas Flood ?

## 2021-08-20 ENCOUNTER — Telehealth: Payer: Self-pay | Admitting: *Deleted

## 2021-08-20 NOTE — Telephone Encounter (Signed)
LMTC to schedule Yearly Lung CA CT Scan. 

## 2021-08-29 ENCOUNTER — Other Ambulatory Visit: Payer: Self-pay | Admitting: Physician Assistant

## 2021-08-29 DIAGNOSIS — E7800 Pure hypercholesterolemia, unspecified: Secondary | ICD-10-CM

## 2021-09-04 ENCOUNTER — Other Ambulatory Visit: Payer: Self-pay | Admitting: Cardiology

## 2021-09-04 DIAGNOSIS — I9789 Other postprocedural complications and disorders of the circulatory system, not elsewhere classified: Secondary | ICD-10-CM

## 2021-10-16 ENCOUNTER — Telehealth: Payer: Self-pay | Admitting: Student

## 2021-10-16 NOTE — Telephone Encounter (Signed)
Patient called requesting refill of her Plavix.  Per documentation of her last office visit with Dr. Estanislado Pandy this mediation was not intended to be used long-term.  Spoke with Dr. Estanislado Pandy and confirmed patient does not need to continue Plavix past this script.   Patient aware to continue aspirin '81mg'$  indefinitely.  Discontinue Plavix once script runs out.   She verbalizes understanding.   Brynda Greathouse, MS RD PA-C

## 2021-10-30 ENCOUNTER — Telehealth: Payer: Self-pay | Admitting: Pulmonary Disease

## 2021-10-30 NOTE — Telephone Encounter (Signed)
Patient states she has had an issue with constipation due to trelegy for the last month. Her diet has not changed. Please advise call back number is [phone].

## 2021-10-30 NOTE — Telephone Encounter (Signed)
I spoke with patient. Been on Trelegy nearly 2 years. New constipation felt unlikely to be related to anti-cholinergic effects of LAMA. Advised over the counter miralax, take as recommended on bottle once a day for 3 days. If still constipated, take twice a day for 3 days. Decrease or stop once constipation improves. To call us next week if no improvement. Advised will likely need assistance of PCP at that point.

## 2021-10-30 NOTE — Telephone Encounter (Signed)
Called patient and she states since she started Trelegy she has been having issues with constipation.   She states she has not changed anything other then adding the inhaler.   Please advise sir

## 2022-01-31 ENCOUNTER — Encounter: Payer: Self-pay | Admitting: *Deleted

## 2022-02-06 ENCOUNTER — Telehealth (HOSPITAL_COMMUNITY): Payer: Self-pay

## 2022-02-06 ENCOUNTER — Other Ambulatory Visit (HOSPITAL_COMMUNITY): Payer: Self-pay | Admitting: Interventional Radiology

## 2022-02-06 DIAGNOSIS — I671 Cerebral aneurysm, nonruptured: Secondary | ICD-10-CM

## 2022-02-06 NOTE — Telephone Encounter (Signed)
Called to schedule mra, no answer, left vm. AW  ?

## 2022-02-22 ENCOUNTER — Other Ambulatory Visit: Payer: Self-pay | Admitting: Cardiology

## 2022-02-22 DIAGNOSIS — I4891 Unspecified atrial fibrillation: Secondary | ICD-10-CM

## 2022-02-27 ENCOUNTER — Other Ambulatory Visit: Payer: Self-pay | Admitting: Cardiology

## 2022-02-27 DIAGNOSIS — I4891 Unspecified atrial fibrillation: Secondary | ICD-10-CM

## 2022-03-19 ENCOUNTER — Ambulatory Visit (HOSPITAL_COMMUNITY)
Admission: RE | Admit: 2022-03-19 | Discharge: 2022-03-19 | Disposition: A | Discharge status: Home / Self Care | Payer: Medicare PPO | Source: Ambulatory Visit | Attending: Interventional Radiology | Admitting: Interventional Radiology

## 2022-03-19 DIAGNOSIS — I671 Cerebral aneurysm, nonruptured: Secondary | ICD-10-CM | POA: Insufficient documentation

## 2022-03-25 ENCOUNTER — Telehealth (HOSPITAL_COMMUNITY): Payer: Self-pay

## 2022-03-25 NOTE — Telephone Encounter (Signed)
Pt agreed to f/u in 6 months with a mra head wo. AW

## 2022-03-25 NOTE — Telephone Encounter (Signed)
Called pt regarding recent imaging, no answer, left vm. AW  

## 2022-07-28 ENCOUNTER — Other Ambulatory Visit: Payer: Self-pay | Admitting: Cardiology

## 2022-07-28 DIAGNOSIS — I4891 Unspecified atrial fibrillation: Secondary | ICD-10-CM

## 2022-08-21 ENCOUNTER — Other Ambulatory Visit: Payer: Self-pay | Admitting: Cardiology

## 2022-08-21 DIAGNOSIS — I4891 Unspecified atrial fibrillation: Secondary | ICD-10-CM

## 2022-08-25 ENCOUNTER — Other Ambulatory Visit: Payer: Self-pay | Admitting: Cardiology

## 2022-08-25 ENCOUNTER — Other Ambulatory Visit: Payer: Self-pay | Admitting: Pulmonary Disease

## 2022-08-25 DIAGNOSIS — E7800 Pure hypercholesterolemia, unspecified: Secondary | ICD-10-CM

## 2022-08-25 DIAGNOSIS — I4891 Unspecified atrial fibrillation: Secondary | ICD-10-CM

## 2022-09-02 ENCOUNTER — Other Ambulatory Visit: Payer: Self-pay | Admitting: Cardiology

## 2022-09-02 DIAGNOSIS — I4891 Unspecified atrial fibrillation: Secondary | ICD-10-CM

## 2022-09-17 ENCOUNTER — Other Ambulatory Visit: Payer: Self-pay | Admitting: Cardiology

## 2022-09-17 DIAGNOSIS — E7800 Pure hypercholesterolemia, unspecified: Secondary | ICD-10-CM

## 2022-09-21 ENCOUNTER — Other Ambulatory Visit: Payer: Self-pay | Admitting: Cardiology

## 2022-09-21 DIAGNOSIS — E7800 Pure hypercholesterolemia, unspecified: Secondary | ICD-10-CM

## 2022-09-22 ENCOUNTER — Other Ambulatory Visit: Payer: Self-pay | Admitting: Cardiology

## 2022-09-22 DIAGNOSIS — I4891 Unspecified atrial fibrillation: Secondary | ICD-10-CM

## 2022-10-01 ENCOUNTER — Other Ambulatory Visit: Payer: Self-pay | Admitting: Pulmonary Disease

## 2022-10-01 ENCOUNTER — Telehealth: Payer: Self-pay | Admitting: Cardiology

## 2022-10-01 DIAGNOSIS — E7800 Pure hypercholesterolemia, unspecified: Secondary | ICD-10-CM

## 2022-10-01 MED ORDER — ATORVASTATIN CALCIUM 40 MG PO TABS: 40.0000 mg | ORAL_TABLET | Freq: Every day | ORAL | 0 refills | Status: DC

## 2022-10-01 NOTE — Telephone Encounter (Signed)
*  STAT* If patient is at the pharmacy, call can be transferred to refill team.   1. Which medications need to be refilled? (please list name of each medication and dose if known)    atorvastatin (LIPITOR) 40 MG tablet    2. Which pharmacy/location (including street and city if local pharmacy) is medication to be sent to?  [id number] - [place], [place] - [address]    3. Do they need a 30 day or 90 day supply? 90    Pt is scheduled for 12/11/22 with Dr. Mayford Knife

## 2022-10-01 NOTE — Telephone Encounter (Signed)
Pt's medication was sent to pt's pharmacy as requested. Confirmation received.  °

## 2022-10-22 ENCOUNTER — Other Ambulatory Visit (HOSPITAL_COMMUNITY): Payer: Self-pay | Admitting: Interventional Radiology

## 2022-10-22 DIAGNOSIS — I671 Cerebral aneurysm, nonruptured: Secondary | ICD-10-CM

## 2022-10-23 ENCOUNTER — Other Ambulatory Visit: Payer: Self-pay | Admitting: Pulmonary Disease

## 2022-11-10 ENCOUNTER — Ambulatory Visit (HOSPITAL_COMMUNITY)
Admission: RE | Admit: 2022-11-10 | Discharge: 2022-11-10 | Disposition: A | Discharge status: Home / Self Care | Payer: Medicare PPO | Source: Ambulatory Visit | Attending: Interventional Radiology | Admitting: Interventional Radiology

## 2022-11-10 DIAGNOSIS — I671 Cerebral aneurysm, nonruptured: Secondary | ICD-10-CM | POA: Insufficient documentation

## 2022-11-20 ENCOUNTER — Other Ambulatory Visit: Payer: Self-pay | Admitting: Cardiology

## 2022-11-20 DIAGNOSIS — I4891 Unspecified atrial fibrillation: Secondary | ICD-10-CM

## 2022-11-25 ENCOUNTER — Telehealth (HOSPITAL_COMMUNITY): Payer: Self-pay

## 2022-11-25 NOTE — Telephone Encounter (Signed)
Called pt regarding recent imaging, no answer, left vm. AB  

## 2022-11-25 NOTE — Telephone Encounter (Signed)
Pt agreed to f/u in 6 months with an mra. AB

## 2022-11-26 ENCOUNTER — Other Ambulatory Visit: Payer: Self-pay | Admitting: Cardiology

## 2022-11-26 DIAGNOSIS — I4891 Unspecified atrial fibrillation: Secondary | ICD-10-CM

## 2022-12-09 ENCOUNTER — Other Ambulatory Visit: Payer: Self-pay | Admitting: Cardiology

## 2022-12-09 DIAGNOSIS — I9789 Other postprocedural complications and disorders of the circulatory system, not elsewhere classified: Secondary | ICD-10-CM

## 2022-12-11 ENCOUNTER — Encounter: Payer: Self-pay | Admitting: Cardiology

## 2022-12-11 ENCOUNTER — Ambulatory Visit: Payer: Medicare PPO | Attending: Cardiology | Admitting: Cardiology

## 2022-12-11 VITALS — BP 110/70 | HR 78

## 2022-12-11 DIAGNOSIS — I3139 Other pericardial effusion (noninflammatory): Secondary | ICD-10-CM

## 2022-12-11 DIAGNOSIS — I2510 Atherosclerotic heart disease of native coronary artery without angina pectoris: Secondary | ICD-10-CM

## 2022-12-11 DIAGNOSIS — I4729 Other ventricular tachycardia: Secondary | ICD-10-CM

## 2022-12-11 DIAGNOSIS — I4891 Unspecified atrial fibrillation: Secondary | ICD-10-CM

## 2022-12-11 DIAGNOSIS — Z79899 Other long term (current) drug therapy: Secondary | ICD-10-CM

## 2022-12-11 DIAGNOSIS — I9789 Other postprocedural complications and disorders of the circulatory system, not elsewhere classified: Secondary | ICD-10-CM | POA: Diagnosis not present

## 2022-12-11 DIAGNOSIS — E7800 Pure hypercholesterolemia, unspecified: Secondary | ICD-10-CM

## 2022-12-11 MED ORDER — METOPROLOL TARTRATE 25 MG PO TABS: 25.0000 mg | ORAL_TABLET | Freq: Two times a day (BID) | ORAL | 3 refills | Status: AC

## 2022-12-11 NOTE — Patient Instructions (Signed)
Medication Instructions:  Your physician recommends that you continue on your current medications as directed. Please refer to the Current Medication list given to you today.  *If you need a refill on your cardiac medications before your next appointment, please call your pharmacy*   Lab Work: Please complete a TSH, FASTING LIPID PANEL and an ALT today in our lab before you leave.  If you have labs (blood work) drawn today and your tests are completely normal, you will receive your results only by: MyChart Message (if you have MyChart) OR A paper copy in the mail If you have any lab test that is abnormal or we need to change your treatment, we will call you to review the results.   Testing/Procedures: None.   Follow-Up:  Your next appointment:   1 year(s)  Provider:   Armanda Magic, MD

## 2022-12-11 NOTE — Progress Notes (Signed)
Cardiology Office Note:    Date:  12/11/2022   ID:  Mercedes Thornton, Mercedes Thornton 1945/06/23, MRN [id number]  PCP:  Mercedes Livings, Mercedes Thornton  Cardiologist:  Mercedes Magic, Mercedes Thornton    Referring Mercedes Thornton: Mercedes Livings, Mercedes Thornton   Chief Complaint  Patient presents with   Coronary Artery Disease   Hyperlipidemia    History of Present Illness:    Mercedes Thornton is a 77 y.o. female with a hx of  ongoing tobacco use, COPD, DM, dyslipidemia, pericardial effusion, nonobstructive ASCAD with 50% RCA by cath 02/2015 and bipolar disorder.  She has a history of post op afib,  diastolic CHF and NSVT after appendectomy. She has chronic DOE from her COPD and is on O2 at night.    She is here today for followup and is doing well.  She has chronic DOE related to her COPD and is stable and has not had to use her inhalers recently.  She also has intermittent ankle edema but that is stable too. She denies any chest pain or pressure, PND, orthopnea,  dizziness, palpitations or syncope. She is compliant with her meds and is tolerating meds with no SE.    Past Medical History:  Diagnosis Date   Allergic rhinitis    Anemia    Anxiety    Bipolar affective disorder (HCC)    Cerebral aneurysm    COPD (chronic obstructive pulmonary disease) (HCC)    Coronary artery disease 02/13/2015   50% RCA otherwise normal   Cough 10/30/2011   CVA (cerebral vascular accident) (HCC)    Depression    DM (diabetes mellitus) (HCC)    Type II   Hyperlipidemia    Leukocytosis    Mediastinal adenopathy 11/26/2011   Pericardial effusion    Postoperative atrial fibrillation (HCC)    Smoking    SOB (shortness of breath) 10/30/2011    Past Surgical History:  Procedure Laterality Date   CARDIAC CATHETERIZATION N/A 02/15/2015   Procedure: Left Heart Cath and Coronary Angiography;  Surgeon: Marykay Lex, Mercedes Thornton;  Location: [facility] INVASIVE CV LAB;  Service: Cardiovascular;  Laterality: N/A;   CATARACT EXTRACTION     CESAREAN SECTION     COLONOSCOPY  2008    negative per pt's report, with [facility]    IR 3D INDEPENDENT WKST  01/31/2021   IR ANGIO INTRA EXTRACRAN SEL COM CAROTID INNOMINATE BILAT MOD SED  01/11/2021   IR ANGIO VERTEBRAL SEL VERTEBRAL BILAT MOD SED  01/11/2021   IR CT HEAD LTD  01/31/2021   IR RADIOLOGIST EVAL & MGMT  01/08/2021   IR RADIOLOGIST EVAL & MGMT  02/21/2021   IR TRANSCATH/EMBOLIZ  01/31/2021   IR US GUIDE VASC ACCESS RIGHT  01/11/2021   IR US GUIDE VASC ACCESS RIGHT  01/31/2021   LAPAROSCOPIC APPENDECTOMY N/A 01/31/2015   Procedure: LYSIS OF ADHESIONS [name] LAPAROSCOPIC ;  Surgeon: [name], Mercedes Thornton;  Location: [facility] ORS;  Service: General;  Laterality: N/A;   RADIOLOGY WITH ANESTHESIA N/A 01/31/2021   Procedure: EMBOLIZATION;  Surgeon: [name], Mercedes Thornton;  Location: [facility] OR;  Service: Radiology;  Laterality: N/A;    Current Medications: No outpatient medications have been marked as taking for the 12/11/22 encounter (Office Visit) with Mercedes Reichert, Mercedes Thornton.     Allergies:   Penicillins   Social History   Socioeconomic History   Marital status: Divorced    Spouse name: Not on file   Number of children: 2   Years of education: Not on file  Highest education level: Not on file  Occupational History    Comment: retired grade school/middle school teacher  Tobacco Use   Smoking status: Every Day    Current packs/day: 0.50    Average packs/day: 0.5 packs/day for 51.0 years (25.5 ttl pk-yrs)    Types: Cigarettes   Smokeless tobacco: Never   Tobacco comments:    Pt states that she is back to smoking 3-4 cigarettes a day. ALS 4/27  Vaping Use   Vaping status: Never Used  Substance and Sexual Activity   Alcohol use: Not Currently   Drug use: No   Sexual activity: Not on file  Other Topics Concern   Not on file  Social History Narrative   Not on file   Social Determinants of Health   Financial Resource Strain: Not on file  Food Insecurity: No Food Insecurity (05/16/2021)   Received from [facility],  [facility]   Hunger Vital Sign    Worried About Running Out of Food in the Last Year: Never true    Ran Out of Food in the Last Year: Never true  Transportation Needs: Not on file  Physical Activity: Not on file  Stress: Not on file  Social Connections: Unknown (08/22/2021)   Received from [facility], [facility]   Social Network    Social Network: Not on file     Family History: The patient's family history includes Cancer in her father; Stroke in her mother. There is no history of Breast cancer.  ROS:   Please see the history of present illness.    ROS  All other systems reviewed and negative.   EKGs/Labs/Other Studies Reviewed:    The following studies were reviewed today:  EKG Interpretation Date/Time:  Thursday December 11 2022 13:29:43 EDT Ventricular Rate:  78 PR Interval:  152 QRS Duration:  70 QT Interval:  384 QTC Calculation: 437 R Axis:   78  Text Interpretation: Normal sinus rhythm Normal ECG When compared with ECG of 27-Sep-2020 16:11, PREVIOUS ECG IS PRESENT Confirmed by Mercedes Thornton ([id number]) on 12/11/2022 1:32:54 PM    2D echo 2022 IMPRESSIONS    IMPRESSIONS    1. Left ventricular ejection fraction, by estimation, is 60 to 65%. The  left ventricle has normal function. The left ventricle has no regional  wall motion abnormalities. There is mild left ventricular hypertrophy of  the basal-septal segment. Left  ventricular diastolic parameters are consistent with Grade I diastolic  dysfunction (impaired relaxation). Elevated left atrial pressure.   2. Right ventricular systolic function is normal. The right ventricular  size is normal.   3. The mitral valve is normal in structure. No evidence of mitral valve  regurgitation. No evidence of mitral stenosis.   4. The aortic valve is tricuspid. Aortic valve regurgitation is not  visualized. Mild aortic valve sclerosis is present, with no evidence of  aortic valve stenosis.   5. The inferior vena cava  is normal in size with greater than 50%  respiratory variability, suggesting right atrial pressure of 3 mmHg.     Recent Labs: No results found for requested labs within last 365 days.   Recent Lipid Panel    Component Value Date/Time   CHOL 135 06/15/2020 1007   TRIG 117 06/15/2020 1007   HDL 41 06/15/2020 1007   CHOLHDL 3.3 06/15/2020 1007   CHOLHDL 2.0 06/13/2015 1056   VLDL 18 06/13/2015 1056   LDLCALC 73 06/15/2020 1007    Physical Exam:    VS:  BP 110/70   Pulse 78   SpO2 94%     Wt Readings from Last 3 Encounters:  08/08/21 156 lb (70.8 kg)  08/06/21 155 lb 12.8 oz (70.7 kg)  01/31/21 150 lb (68 kg)     GEN: Well nourished, well developed in no acute distress HEENT: Normal NECK: No JVD; No carotid bruits LYMPHATICS: No lymphadenopathy CARDIAC:RRR, no murmurs, rubs, gallops RESPIRATORY:  Clear to auscultation without rales, wheezing or rhonchi  ABDOMEN: Soft, non-tender, non-distended MUSCULOSKELETAL:  No edema; No deformity  SKIN: Warm and dry NEUROLOGIC:  Alert and oriented x 3 PSYCHIATRIC:  Normal affect  ASSESSMENT:    1. CAD in native artery   2. NSVT (nonsustained ventricular tachycardia) (HCC)   3. Postoperative atrial fibrillation (HCC)   4. Pericardial effusion   5. Pure hypercholesterolemia     PLAN:    In order of problems listed above:  1.  ASCAD -nonobstructive ASCAD with 50% RCA by cath 02/2015 -she denies any anginal symptoms from when she was last seen by cardiology -Continue prescription drug managed with aspirin 81 mg daily, atorvastatin 40 mg daily and Lopressor 25 mg twice daily with as needed refills  2.  Ventricular tachycardia -She denies any recent history of palpitations, dizziness or syncope -Continue drug management with Lopressor 25 mg twice daily with as needed refills  3.  Postop afib -occurred after appendectomy with no further episodes -not on anticoagulation since occurred in the post op setting with no  reoccurrence -She denies any palpitations -Continue Lopressor 25 mg twice daily  4.  Pericardial effusion -mild posterior effusion by echo 2018 and was supposed to have repeat echo but this was never done -trivial posterior effusion persisted but asymptomatic on echo 08/31/2020  5.  HLD -LDL goal < 70 -Check FLP and ALT -Continue prescription drug management with atorvastatin 40 mg daily with as needed refills   Medication Adjustments/Labs and Tests Ordered: Current medicines are reviewed at length with the patient today.  Concerns regarding medicines are outlined above.  Orders Placed This Encounter  Procedures   EKG 12-Lead   No orders of the defined types were placed in this encounter.   Signed, Mercedes Magic, Mercedes Thornton  12/11/2022 1:39 PM    [facility]

## 2022-12-11 NOTE — Addendum Note (Signed)
Addended by: Luellen Pucker on: 12/11/2022 01:44 PM   Modules accepted: Orders

## 2022-12-12 LAB — LIPID PANEL
Chol/HDL Ratio: 3 ratio (ref 0.0–4.4)
Cholesterol, Total: 155 mg/dL (ref 100–199)
HDL: 51 mg/dL (ref >39)
LDL Chol Calc (NIH): 82 mg/dL (ref 0–99)
Triglycerides: 123 mg/dL (ref 0–149)
VLDL Cholesterol Cal: 22 mg/dL (ref 5–40)

## 2022-12-12 LAB — ALT: ALT: 9 IU/L (ref 0–32)

## 2022-12-12 LAB — TSH: TSH: 3.92 u[IU]/mL (ref 0.450–4.500)

## 2022-12-19 ENCOUNTER — Telehealth: Payer: Self-pay

## 2022-12-19 DIAGNOSIS — E7800 Pure hypercholesterolemia, unspecified: Secondary | ICD-10-CM

## 2022-12-19 DIAGNOSIS — Z79899 Other long term (current) drug therapy: Secondary | ICD-10-CM

## 2022-12-19 NOTE — Telephone Encounter (Signed)
-----   Message from Armanda Magic sent at 12/15/2022  9:39 AM EDT ----- LDL not at goal of < 70.   Please increase Atorvastatin to 80mg  daily and repeat FLP and ALT In 6 weeks

## 2022-12-19 NOTE — Telephone Encounter (Signed)
Call to advise patient that LDL not at goal of < 70.   Patient declines to  increase Atorvastatin to 80mg  daily at this time,states she would like to make some diet changes and repeat FLP and ALT In 6 weeks. Labs scheduled for 01/23/23 and patient responses forwarded to Dr. Mayford Knife.

## 2023-01-23 ENCOUNTER — Ambulatory Visit: Payer: Medicare PPO

## 2023-01-26 ENCOUNTER — Ambulatory Visit: Payer: Medicare PPO

## 2023-02-20 ENCOUNTER — Other Ambulatory Visit: Payer: Self-pay | Admitting: Pulmonary Disease

## 2023-02-23 NOTE — Telephone Encounter (Signed)
Trelegy refill request   [facility] on [address]

## 2023-05-06 ENCOUNTER — Encounter: Payer: Self-pay | Admitting: Pulmonary Disease

## 2023-05-06 ENCOUNTER — Ambulatory Visit: Payer: Medicare PPO | Admitting: Pulmonary Disease

## 2023-05-06 VITALS — BP 120/76 | HR 76 | Ht 64.0 in | Wt 144.6 lb

## 2023-05-06 DIAGNOSIS — J432 Centrilobular emphysema: Secondary | ICD-10-CM

## 2023-05-06 DIAGNOSIS — F17210 Nicotine dependence, cigarettes, uncomplicated: Secondary | ICD-10-CM

## 2023-05-06 DIAGNOSIS — F17200 Nicotine dependence, unspecified, uncomplicated: Secondary | ICD-10-CM

## 2023-05-06 DIAGNOSIS — Z122 Encounter for screening for malignant neoplasm of respiratory organs: Secondary | ICD-10-CM | POA: Diagnosis not present

## 2023-05-06 MED ORDER — ALBUTEROL SULFATE HFA 108 (90 BASE) MCG/ACT IN AERS: 2.0000 | INHALATION_SPRAY | Freq: Four times a day (QID) | RESPIRATORY_TRACT | 3 refills | Status: AC | PRN

## 2023-05-06 MED ORDER — TRELEGY ELLIPTA 100-62.5-25 MCG/ACT IN AEPB: 1.0000 | INHALATION_SPRAY | Freq: Every day | RESPIRATORY_TRACT | 11 refills | Status: AC

## 2023-05-06 NOTE — Progress Notes (Signed)
@Patient  ID: Mercedes Thornton, female    DOB: [birth year], 78 y.o.   MRN: [id number]  Chief Complaint  Patient presents with   Follow-up    Centrilobular emphysema F/U visit. Pt wants to discuss Trelegy    Referring provider: Quitman Livings, MD  HPI:   78 y.o.whom we are seeing in follow-up for COPD.  Most recent cardiology note reviewed.  Doing fine.  Reports Trelegy continues to be very beneficial.  No surveillance in nearly 2 years.  Feels like it helps her breathing day-to-day.  Rare if any albuterol use over the last year.  ER plus.  We discussed lung cancer screening.  Last CT scan 2022.  She has not had one since despite referral to lung cancer screening program at last visit in 2023.  Reiterated the importance of this.  Will work again of getting her back in the queue for yearly CT scans.  Discussed currently she was aged out of age 72, 4 years from now.  HPI at initial visit: Overall, patient doing well.  She has baseline dyspnea on exertion.  Insurance getting better or worse.  Relatively stable.  Worse with inclines or stairs.  Has occasional productive cough.  No rhyme or reason to this.  She continues to smoke, is trying to cut down.  Has tried multiple cessation attempts in the past.  Smoking cessation clinic, nicotine etc.  She is precontemplative at this time.  She has had no exacerbations of her COPD.  No prednisone use.  She reports rare if ever using her albuterol rescue inhaler.  She continues Trelegy 1 puff daily every day.  She says this is quite helpful.  She notices a big difference in this compared to her prior inhaler regimen.  Most recent CT chest lung cancer screening reviewed with patient with 2 tiny sub-3 mm nodules, emphysema otherwise clear on my interpretation.  Most recent PFTs were 07/2019, which on my interpretation demonstrated moderate fixed obstruction on spirometry.   Questionaires / Pulmonary Flowsheets:   ACT:      No data to display           MMRC:     No data to display          Epworth:      No data to display          Tests:   FENO:  No results found for: "NITRICOXIDE"  PFT:    Latest Ref Rng & Units 08/04/2019    3:53 PM 04/17/2015    3:08 PM  PFT Results  FVC-Pre L 1.77  1.89   FVC-Predicted Pre % 74  75   FVC-Post L 1.79    FVC-Predicted Post % 75    Pre FEV1/FVC % % 66  73   Post FEV1/FCV % % 66    FEV1-Pre L 1.16  1.39   FEV1-Predicted Pre % 62  71   FEV1-Post L 1.18    Personally reviewed and interpreted as mild fixed obstruction  WALK:     08/19/2016    3:47 PM 03/01/2015    5:10 PM  SIX MIN WALK  Supplimental Oxygen during Test? (L/min) No No  Tech Comments: pt walked a moderate pace, did not complete laps 2 and 3 at pt request- pt states she has errands to run after appt and did not wish to overexert herself.  Pt's lowest ambulatory sat was 90 on RA. Pt walked at a slower pace with no breaks.    Imaging:  Reviewed as per  EMR and discussion in this note No results found.   Lab Results: Reviewed, no anemia in 2017 CBC    Component Value Date/Time   WBC 16.4 (H) 02/01/2021 0519   RBC 3.16 (L) 02/01/2021 0519   HGB 8.7 (L) 02/01/2021 0519   HGB 13.8 08/23/2015 1313   HCT 27.1 (L) 02/01/2021 0519   HCT 42.0 08/23/2015 1313   PLT 293 02/01/2021 0519   PLT 262 08/23/2015 1313   MCV 85.8 02/01/2021 0519   MCV 88.2 08/23/2015 1313   MCH 27.5 02/01/2021 0519   MCHC 32.1 02/01/2021 0519   RDW 17.7 (H) 02/01/2021 0519   RDW 15.0 (H) 08/23/2015 1313   LYMPHSABS 1.4 02/01/2021 0519   LYMPHSABS 3.1 08/23/2015 1313   MONOABS 1.4 (H) 02/01/2021 0519   MONOABS 1.0 (H) 08/23/2015 1313   EOSABS 0.1 02/01/2021 0519   EOSABS 0.2 08/23/2015 1313   BASOSABS 0.1 02/01/2021 0519   BASOSABS 0.1 08/23/2015 1313    BMET    Component Value Date/Time   NA 138 02/01/2021 0519   NA 143 08/23/2015 1313   K 3.9 02/01/2021 0519   K 3.7 08/23/2015 1313   CL 112 (H) 02/01/2021 0519   CL 105  06/02/2012 1140   CO2 21 (L) 02/01/2021 0519   CO2 27 08/23/2015 1313   GLUCOSE 149 (H) 02/01/2021 0519   GLUCOSE 175 (H) 08/23/2015 1313   GLUCOSE 89 06/02/2012 1140   BUN 22 02/01/2021 0519   BUN 9.8 08/23/2015 1313   CREATININE 1.00 02/01/2021 0519   CREATININE 0.8 08/23/2015 1313   CALCIUM 8.2 (L) 02/01/2021 0519   CALCIUM 9.7 08/23/2015 1313   GFRNONAA 59 (L) 02/01/2021 0519   GFRAA >60 02/16/2015 0523    BNP    Component Value Date/Time   BNP 487.5 (H) 02/01/2015 1530    ProBNP No results found for: "PROBNP"  Specialty Problems       Pulmonary Problems   Cough   SOB (shortness of breath)   Lung nodule   Chronic respiratory failure (HCC)   ONO 03/15/15 + desats >set up for 2l/m At bedtime   05/2015 Polysomnogram> no sleep apnea, significant desaturation       Centrilobular emphysema (HCC)   04/2015 PFT> didn't meet ATS criteria but flow volume loop was worrisome for airflow obstruction 08/2015 Spirometry > consistent with airflow obstruction but ratio normal, FEV1 1.39 L (71% predicted), FVC 1.89 L (75% predicted)       Allergies  Allergen Reactions   Penicillins Hives and Swelling    Has patient had a PCN reaction causing immediate rash, facial/tongue/throat swelling, SOB or lightheadedness with hypotension: Yes Has patient had a PCN reaction causing severe rash involving mucus membranes or skin necrosis: Yes Has patient had a PCN reaction that required hospitalization Yes- hospital for 7 days Has patient had a PCN reaction occurring within the last 10 years: No If all of the above answers are "NO", then may proceed with Cephalosporin use.  Other reaction(s): Swelling Has patient had a PCN reaction causing immediate rash, facial/tongue/throat swelling, SOB or lightheadedness with hypotension: Yes Has patient had a PCN reaction causing severe rash involving mucus membranes or skin necrosis: Yes Has patient had a PCN reaction that required hospitalization Yes-  hospital for 7 days Has patient had a PCN reaction occurring within the last 10 years: No If all of the above answers are "NO", then may proceed with Cephalosporin use. Has patient had a PCN  reaction causing immediate rash, facial/tongue/throat swelling, SOB or lightheadedness with hypotension: Yes Has patient had a PCN reaction causing severe rash involving mucus membranes or skin necrosis: Yes Has patient had a PCN reaction that required hospitalization Yes- hospital for 7 days Has patient had a PCN reaction occurring within the last 10 years: No If all of the above answers are "NO", then may proceed with Cephalosporin use.      There is no immunization history on file for this patient.  Past Medical History:  Diagnosis Date   Allergic rhinitis    Anemia    Anxiety    Bipolar affective disorder (HCC)    Cerebral aneurysm    COPD (chronic obstructive pulmonary disease) (HCC)    Coronary artery disease 02/13/2015   50% RCA otherwise normal   Cough 10/30/2011   CVA (cerebral vascular accident) (HCC)    Depression    DM (diabetes mellitus) (HCC)    Type II   Hyperlipidemia    Leukocytosis    Mediastinal adenopathy 11/26/2011   Pericardial effusion    Postoperative atrial fibrillation (HCC)    Smoking    SOB (shortness of breath) 10/30/2011    Tobacco History: Social History   Tobacco Use  Smoking Status Every Day   Current packs/day: 0.50   Average packs/day: 0.5 packs/day for 51.0 years (25.5 ttl pk-yrs)   Types: Cigarettes  Smokeless Tobacco Never  Tobacco Comments   Pt states that she is back to smoking 3-4 cigarettes a day. ALS 4/27   Ready to quit: Not Answered Counseling given: Not Answered Tobacco comments: Pt states that she is back to smoking 3-4 cigarettes a day. ALS 4/27     Outpatient Encounter Medications as of 05/06/2023  Medication Sig   aspirin 81 MG tablet Take 81 mg by mouth daily.   atorvastatin (LIPITOR) 40 MG tablet Take 1 tablet (40 mg  total) by mouth daily.   Cholecalciferol (VITAMIN D3) 3000 UNITS TABS Take 3,000 Units by mouth daily.   CINNAMON PO Take 2,000 mg by mouth daily.   clonazePAM (KLONOPIN) 1 MG tablet Take 1 mg by mouth See admin instructions. Take 1mg  every AM then twice a day as needed for anxiety.   fexofenadine (ALLEGRA) 180 MG tablet Take 180 mg by mouth daily.   fluticasone (FLONASE) 50 MCG/ACT nasal spray Place 1 spray into both nostrils daily as needed for allergies.    glimepiride (AMARYL) 4 MG tablet Take 4 mg by mouth daily with breakfast.   metFORMIN (GLUCOPHAGE) 500 MG tablet Take 500 mg by mouth daily with breakfast.   metoprolol tartrate (LOPRESSOR) 25 MG tablet Take 1 tablet (25 mg total) by mouth 2 (two) times daily.   Multiple Vitamins-Minerals (WOMENS MULTIVITAMIN PLUS PO) Take 1 tablet by mouth daily.    OXYGEN Inhale 2 Units into the lungs at bedtime.   Polyethyl Glycol-Propyl Glycol (SYSTANE OP) Apply 1 drop to eye every 6 (six) hours as needed (dry eyes).   vitamin C (ASCORBIC ACID) 500 MG tablet Take 500 mg by mouth daily.   [DISCONTINUED] albuterol (VENTOLIN HFA) 108 (90 Base) MCG/ACT inhaler Inhale 2 puffs into the lungs every 6 (six) hours as needed for wheezing.   [DISCONTINUED] TRELEGY ELLIPTA 100-62.5-25 MCG/ACT AEPB INHALE 1 PUFF BY MOUTH EVERY DAY   albuterol (VENTOLIN HFA) 108 (90 Base) MCG/ACT inhaler Inhale 2 puffs into the lungs every 6 (six) hours as needed for wheezing.   Fluticasone-Umeclidin-Vilant (TRELEGY ELLIPTA) 100-62.5-25 MCG/ACT AEPB Inhale 1 puff into  the lungs daily.   No facility-administered encounter medications on file as of 05/06/2023.     Review of Systems N/a  Physical Exam  BP 120/76 (BP Location: Left Arm, Patient Position: Sitting, Cuff Size: Normal)   Pulse 76   Ht 5\' 4"  (1.626 m)   Wt 144 lb 9.6 oz (65.6 kg)   SpO2 90%   BMI 24.82 kg/m   Wt Readings from Last 5 Encounters:  05/06/23 144 lb 9.6 oz (65.6 kg)  08/08/21 156 lb (70.8 kg)   08/06/21 155 lb 12.8 oz (70.7 kg)  01/31/21 150 lb (68 kg)  01/11/21 145 lb (65.8 kg)    BMI Readings from Last 5 Encounters:  05/06/23 24.82 kg/m  08/08/21 27.63 kg/m  08/06/21 27.60 kg/m  01/31/21 25.75 kg/m  01/11/21 24.89 kg/m     Physical Exam General: Well-appearing, no acute distress Eyes: EOMI, no icterus Neck: Supple, no JVP Pulmonary: Clear to auscultation bilaterally, no wheezes or crackles Cardiovascular: Regular rhythm, no murmurs Abdomen: Nondistended, bowel present MSK: No synovitis, no joint effusion Neuro: Normal gait, no weakness Psych: Normal mood, full affect   Assessment & Plan:   Moderate COPD: Well-controlled.  Continue Trelegy, refilled today.  No exacerbations.  Rare albuterol use.  Nocturnal hypoxemia: Continue 2 L nasal cannula at night.  Lung cancer screening: To continue.  We will coordinate with Kandice Robinsons, NP for ongoing follow-up.  Most recent scan 06/2020, lung RADS 2. Will get re-enrolled and resume yearly CT scan soon. This was not done at last visit 2023 despite referral colon cancer screening program.  Smoking assessment and cessation counseling  Patient currently smoking: I have advised the patient to quit/stop smoking as soon as possible due to high risk for multiple medical problems.  It will also be very difficult for [place] to manage patient's  respiratory symptoms and status if we continue to expose her lungs to a known irritant.  We do not advise e-cigarettes as a form of stopping smoking. Patient is  willing to quit smoking. I have advised the patient that we can assist and have options of nicotine replacement therapy, provided smoking cessation education today, provided smoking cessation counseling, and provided cessation resources. Follow-up next office visit office visit for assessment of smoking cessation. I spent 4 minutes in tobacco cessation counseling.   Return in about 1 year (around 05/05/2024) for f/u Dr.  Judeth Horn.   Karren Burly, MD 05/06/2023

## 2023-05-06 NOTE — Patient Instructions (Addendum)
Nice to see you again  Continues on Trelegy 1 puff once a day, rinse your mouth out with water after every use  I am glad you do not need to use albuterol very often, I did send a refill for this so you could have a active prescription in case you need it  I will send a message again about getting reenrolled in lung cancer screening  Return to clinic in 1 year or sooner if needed with Dr. Judeth Horn

## 2023-05-07 ENCOUNTER — Telehealth: Payer: Self-pay | Admitting: *Deleted

## 2023-05-07 NOTE — Telephone Encounter (Signed)
Left VM for pt to call to schedule lung screening CT scan.

## 2023-05-13 LAB — LAB REPORT - SCANNED
A1c: 7.6
EGFR: 65

## 2023-05-18 ENCOUNTER — Other Ambulatory Visit: Payer: Self-pay

## 2023-05-18 ENCOUNTER — Encounter: Payer: Self-pay | Admitting: Acute Care

## 2023-05-18 DIAGNOSIS — Z87891 Personal history of nicotine dependence: Secondary | ICD-10-CM

## 2023-05-18 DIAGNOSIS — F17210 Nicotine dependence, cigarettes, uncomplicated: Secondary | ICD-10-CM

## 2023-05-18 DIAGNOSIS — Z122 Encounter for screening for malignant neoplasm of respiratory organs: Secondary | ICD-10-CM

## 2023-05-29 ENCOUNTER — Telehealth: Payer: Self-pay

## 2023-05-29 DIAGNOSIS — E7800 Pure hypercholesterolemia, unspecified: Secondary | ICD-10-CM

## 2023-05-29 DIAGNOSIS — Z79899 Other long term (current) drug therapy: Secondary | ICD-10-CM

## 2023-05-29 MED ORDER — EZETIMIBE 10 MG PO TABS: 10.0000 mg | ORAL_TABLET | Freq: Every day | ORAL | 3 refills | Status: AC

## 2023-05-29 NOTE — Telephone Encounter (Signed)
-----   Message from Armanda Magic sent at 05/19/2023  9:57 AM EST ----- Recent labs showed LDL 101 with goal less than 70.  Please find out if patient has missed any doses of atorvastatin ----- Message ----- From: Dallie Dad Sent: 05/18/2023  11:20 AM EST To: Quintella Reichert, MD

## 2023-05-29 NOTE — Telephone Encounter (Signed)
Call to patient to advise that per Dr. Mayford Knife, LDL not at goal.  Patient agrees to add on Zetia 10mg  daily to her statin and repeat Fasting Lipid panel and ALT in 8 weeks. Orders placed, labs released.

## 2023-06-01 ENCOUNTER — Ambulatory Visit (HOSPITAL_COMMUNITY)
Admission: RE | Admit: 2023-06-01 | Discharge: 2023-06-01 | Disposition: A | Discharge status: Home / Self Care | Payer: Medicare PPO | Source: Ambulatory Visit | Attending: Acute Care | Admitting: Acute Care

## 2023-06-01 ENCOUNTER — Telehealth: Payer: Self-pay

## 2023-06-01 DIAGNOSIS — J439 Emphysema, unspecified: Secondary | ICD-10-CM | POA: Diagnosis not present

## 2023-06-01 DIAGNOSIS — Z87891 Personal history of nicotine dependence: Secondary | ICD-10-CM

## 2023-06-01 DIAGNOSIS — I2510 Atherosclerotic heart disease of native coronary artery without angina pectoris: Secondary | ICD-10-CM

## 2023-06-01 DIAGNOSIS — E7800 Pure hypercholesterolemia, unspecified: Secondary | ICD-10-CM

## 2023-06-01 DIAGNOSIS — I700 Atherosclerosis of aorta: Secondary | ICD-10-CM | POA: Diagnosis not present

## 2023-06-01 DIAGNOSIS — Z122 Encounter for screening for malignant neoplasm of respiratory organs: Secondary | ICD-10-CM | POA: Diagnosis present

## 2023-06-01 DIAGNOSIS — F17210 Nicotine dependence, cigarettes, uncomplicated: Secondary | ICD-10-CM | POA: Diagnosis present

## 2023-06-01 DIAGNOSIS — Z79899 Other long term (current) drug therapy: Secondary | ICD-10-CM

## 2023-06-01 DIAGNOSIS — I3139 Other pericardial effusion (noninflammatory): Secondary | ICD-10-CM | POA: Diagnosis not present

## 2023-06-01 MED ORDER — ATORVASTATIN CALCIUM 80 MG PO TABS: 80.0000 mg | ORAL_TABLET | Freq: Every day | ORAL | 3 refills | Status: AC

## 2023-06-01 NOTE — Telephone Encounter (Signed)
 Atorvastatin increased to 80mg  daily and continue Zetia - repeat FLP and ALT order for 6 weeks.

## 2023-06-04 ENCOUNTER — Other Ambulatory Visit (HOSPITAL_COMMUNITY): Payer: Self-pay | Admitting: Interventional Radiology

## 2023-06-04 ENCOUNTER — Telehealth (HOSPITAL_COMMUNITY): Payer: Self-pay

## 2023-06-04 DIAGNOSIS — I671 Cerebral aneurysm, nonruptured: Secondary | ICD-10-CM

## 2023-06-04 NOTE — Telephone Encounter (Signed)
 Called to [facility]ra. Pt does not want to schedule at this time. AB

## 2023-06-22 ENCOUNTER — Telehealth: Payer: Self-pay

## 2023-06-22 NOTE — Telephone Encounter (Signed)
 Call Report from [facility]: This was also the patient's last scan in the [facility] program.   IMPRESSION: 1. New pulmonary nodules measuring up to 15.3 mm in the medial left upper lobe. Lung-RADS 4B, suspicious. Additional imaging evaluation or consultation with Pulmonology or Thoracic Surgery recommended. These results will be called to the ordering clinician or representative by the Radiologist Assistant, and communication documented in the PACS or [employer]. 2. Chronic small pericardial effusion. 3. Aortic atherosclerosis (ICD10-I70.0). Coronary artery calcification. 4. Enlarged pulmonic trunk, indicative of pulmonary arterial hypertension. 5.  Emphysema (ICD10-J43.9).

## 2023-06-26 ENCOUNTER — Telehealth: Payer: Self-pay | Admitting: Acute Care

## 2023-06-26 NOTE — Telephone Encounter (Signed)
 I have attempted to call the patient with the results of their  Low Dose CT Chest Lung cancer screening scan. There was no answer. I have left a HIPPA compliant VM requesting the patient call the office for the scan results. I included the office contact information in the message. We will await their  return call. If no return call we will continue to call until patient is contacted.    Ladies, I have called and left messages this afternoon and yesterday 06/25/2023 afternoon. If one of you can try her again Monday. If she does not answer, leave another message, and send a letter asking her to call. She needs a PET scan and then follow up within a week with me or Dr. Delton Coombes as a lung nodule consult in the office. Thanks so much.  Please fax results to PCP once we make contact.

## 2023-06-26 NOTE — Telephone Encounter (Signed)
 Provider LVM for patient on 3/13, awaiting call back

## 2023-06-29 NOTE — Telephone Encounter (Signed)
 Attempted to reach patient to discuss LCS results. Sent to VM. I did LVM requesting her to call the office back. I have sent her a message in [place], which she was active two days ago. I have also sent her a letter requesting she call the office.

## 2023-06-29 NOTE — Telephone Encounter (Signed)
 See provider note 06/26/2023

## 2023-06-30 NOTE — Telephone Encounter (Signed)
 Returned VM. No answer, LVM to call office and discuss results.

## 2023-07-01 ENCOUNTER — Other Ambulatory Visit: Payer: Self-pay | Admitting: Acute Care

## 2023-07-01 ENCOUNTER — Telehealth: Payer: Self-pay | Admitting: Acute Care

## 2023-07-01 DIAGNOSIS — R911 Solitary pulmonary nodule: Secondary | ICD-10-CM

## 2023-07-01 NOTE — Telephone Encounter (Signed)
 I have called the patient with the results of her Low Dose CT chest. The scan was read as a 4 B. She has not had screening in 3 years. Plan is for a PET scan with follow up in the office with me to review results and determine next best plan of care. Patient is in agreement with this plan. Please fax results to PCP and let them know plan for folow up. Thanks so much PET scan has been ordered.

## 2023-07-01 NOTE — Progress Notes (Signed)
 See telephone note 07/01/23

## 2023-07-02 NOTE — Telephone Encounter (Signed)
 See telephone note from 07/01/23.

## 2023-07-02 NOTE — Telephone Encounter (Signed)
Results/ plans faxed to PCP. Will await PET results.  

## 2023-07-07 ENCOUNTER — Telehealth: Payer: Self-pay

## 2023-07-07 NOTE — Telephone Encounter (Signed)
 Lvm and advised patient to call office and schedule f/u appt with Alexandria Lodge, NP one week after PET. PET has been scheduled for 07/13/2023.

## 2023-07-13 ENCOUNTER — Telehealth: Payer: Self-pay

## 2023-07-13 ENCOUNTER — Encounter (HOSPITAL_COMMUNITY)

## 2023-07-13 NOTE — Telephone Encounter (Signed)
 Patient cancelled PET. LVM to call and reschedule PET scan from 06/15/2023. Pt will also need one week f/u appt scheduled. Message sent to [facility] to reschedule patient.

## 2023-07-17 ENCOUNTER — Encounter (HOSPITAL_COMMUNITY)
Admission: RE | Admit: 2023-07-17 | Discharge: 2023-07-17 | Disposition: A | Discharge status: Home / Self Care | Source: Ambulatory Visit | Attending: Acute Care | Admitting: Acute Care

## 2023-07-17 DIAGNOSIS — R911 Solitary pulmonary nodule: Secondary | ICD-10-CM | POA: Insufficient documentation

## 2023-07-17 LAB — GLUCOSE, CAPILLARY: Glucose-Capillary: 70 mg/dL (ref 70–99)

## 2023-07-17 MED ORDER — FLUDEOXYGLUCOSE F - 18 (FDG) INJECTION
7.2000 | Freq: Once | INTRAVENOUS | Status: AC
  Administered 2023-07-17: 7.18 via INTRAVENOUS

## 2023-07-29 ENCOUNTER — Ambulatory Visit: Admitting: Acute Care

## 2023-08-11 ENCOUNTER — Other Ambulatory Visit: Payer: Self-pay

## 2023-08-11 ENCOUNTER — Encounter: Payer: Self-pay | Admitting: Acute Care

## 2023-08-11 ENCOUNTER — Ambulatory Visit: Admitting: Acute Care

## 2023-08-11 VITALS — BP 127/82 | HR 72 | Ht 64.0 in | Wt 139.2 lb

## 2023-08-11 DIAGNOSIS — R948 Abnormal results of function studies of other organs and systems: Secondary | ICD-10-CM | POA: Diagnosis not present

## 2023-08-11 DIAGNOSIS — Z122 Encounter for screening for malignant neoplasm of respiratory organs: Secondary | ICD-10-CM

## 2023-08-11 DIAGNOSIS — F17200 Nicotine dependence, unspecified, uncomplicated: Secondary | ICD-10-CM

## 2023-08-11 DIAGNOSIS — F17210 Nicotine dependence, cigarettes, uncomplicated: Secondary | ICD-10-CM | POA: Diagnosis not present

## 2023-08-11 DIAGNOSIS — R911 Solitary pulmonary nodule: Secondary | ICD-10-CM

## 2023-08-11 DIAGNOSIS — R9389 Abnormal findings on diagnostic imaging of other specified body structures: Secondary | ICD-10-CM

## 2023-08-11 DIAGNOSIS — Z87891 Personal history of nicotine dependence: Secondary | ICD-10-CM

## 2023-08-11 NOTE — Patient Instructions (Addendum)
 It is good to see you today  The lung nodules we were concerned about do not show any significant uptake on PET. This is great news. We will do a 3 month follow up scan to continue to keep an eye on these nodules. This will be due in July 2025. You will get a call to get this scheduled.  There was notation of some uptake on the lower esophagus. I will send a referral to the GI MD to have this finding better evaluated.  It may be your reflux. You will get a call to get this scheduled.  Call if you need us  sooner. Please contact office for sooner follow up if symptoms do not improve or worsen or seek emergency care

## 2023-08-11 NOTE — Progress Notes (Signed)
 History of Present Illness Mercedes Thornton is a 78 y.o. female current every day smoker followed through the lung cancer screening program. She had an abnormal LDCT. She will be followed by Dr. Baldwin Levee and the lung cancer screening program.    08/11/2023 Pt. Presents for follow up after PET scan to better evaluate abnormal low-dose screening CT.Aaron AasShe had an abnormal LDCT on 06/01/2023.  The scan was read June 19, 2023.  Patient was called on July 01, 2023 with the results.  Plan after reviewing the abnormal low-dose CT was for a PET scan to better evaluate the abnormal lung nodules.  Patient presents today after PET scan to review the results.  Patient states she has been doing well. She is compliant with her Trelegy. She has not had to use her rescue inhaler. She has a known past medical history of current tobacco use, mediastinal adenopathy, ventricular tachyarrhythmia, coronary artery disease, postoperative atrial fibrillation, cerebral aneurysm  We have discussed the results of her PET scan.  The PET scan is reassuring as it shows no specific areas of abnormal lung uptake.  Previously there was an area of left upper lobe nodular consolidation just above the left hilum which appears improved from previous exam. We will do a 52-month follow-up low-dose screening CT through the lung cancer screening program to maintain close surveillance of this area.  Patient is in agreement with this plan.  There was notation of some uptake around the lower esophagus.  Patient states she does have some reflux but only uses over-the-counter medication for this as needed.  I will refer her to GI for closer evaluation of this finding.  She is in agreement with this plan.  Referral has been placed.  Test Results: LDCT 06/01/2023, read 06/19/2023 Lungs/Pleura: Centrilobular and paraseptal emphysema. 15.3 mm nodular consolidation in the medial left upper lobe (4/93), new. New a 7.5 mm and 7.9 mm medial left lower lobe  nodules (4/185 and 176, respectively). Mild basilar subpleural reticulation and ground-glass. Lingular volume loss. No pleural fluid. Airway is unremarkable.  PET scan 07/17/2023 and read 07/24/2023 Mild uptake along the lower esophagus. The lower esophagus is patulous. Maximum SUV value of 8.3. Recommend further evaluation to exclude lesion when clinically appropriate. There is some left-sided paraspinal physiologic uptake along the musculature. No additional areas of abnormal radiotracer uptake seen above blood pool including axillary regions, hilum or mediastinum. No specific areas of abnormal lung uptake. Previously there is a left upper lobe area of nodular consolidation just above the left hilum. This area appears improved from previous examination. No specific abnormal uptake in this location. In addition on the prior there were some small areas of nodularity in the left lower lobe medially. There is significant breathing motion noted during the examination but no clear defined nodular in this location today and no uptake.   Incidental CT findings: Emphysematous changes identified. Significant breathing motion. Dependent ground-glass along the left upper lobe greater than right upper lobe and along the lung bases. Atelectasis is favored. Heart is enlarged. Small pericardial effusion. Coronary artery calcifications. Other vascular calcifications are seen. There are some small mediastinal nodes identified including precarinal which are unchanged from recent previous and again do not show any abnormal uptake. These are slightly larger than remote study of 2022.  No specific areas of abnormal radiotracer uptake identified.   The opacity seen suprahilar on the left on the prior CT scan as well as a small left lower lobe nodules are not as well  seen today.   Few prominent not pathologic enlarged mediastinal nodes are also noted. These are larger than 2022 but similar to the more  recent CT and do not show abnormal uptake.   There is some asymmetric uptake along the lower esophagus. Please correlate with any symptoms and with the focality, further workup to exclude underlying lesion when appropriate.    Latest Ref Rng & Units 02/01/2021    5:19 AM 01/31/2021    6:27 AM 01/11/2021    8:34 AM  CBC  WBC 4.0 - 10.5 K/uL 16.4  13.3  18.1   Hemoglobin 12.0 - 15.0 g/dL 8.7  62.1  30.8   Hematocrit 36.0 - 46.0 % 27.1  38.1  37.4   Platelets 150 - 400 K/uL 293  358  372        Latest Ref Rng & Units 02/01/2021    5:19 AM 01/31/2021    6:00 PM 01/31/2021    6:27 AM  BMP  Glucose 70 - 99 mg/dL 657  846  962   BUN 8 - 23 mg/dL 22  20  25    Creatinine 0.44 - 1.00 mg/dL 9.52  8.41  3.24   Sodium 135 - 145 mmol/L 138  135  136   Potassium 3.5 - 5.1 mmol/L 3.9  4.0  4.0   Chloride 98 - 111 mmol/L 112  108  104   CO2 22 - 32 mmol/L 21  19  21    Calcium  8.9 - 10.3 mg/dL 8.2  8.3  9.0     BNP    Component Value Date/Time   BNP 487.5 (H) 02/01/2015 1530    ProBNP No results found for: "PROBNP"  PFT    Component Value Date/Time   FEV1PRE 1.16 08/04/2019 1553   FEV1POST 1.18 08/04/2019 1553   FVCPRE 1.77 08/04/2019 1553   FVCPOST 1.79 08/04/2019 1553   PREFEV1FVCRT 66 08/04/2019 1553   PSTFEV1FVCRT 66 08/04/2019 1553    NM PET Image Initial (PI) Skull Base To Thigh Result Date: 07/24/2023 CLINICAL DATA:  Initial treatment strategy for lung nodule. EXAM: NUCLEAR MEDICINE PET SKULL BASE TO THIGH TECHNIQUE: 7.18 mCi F-18 FDG was injected intravenously. Full-ring PET imaging was performed from the skull base to thigh after the radiotracer. CT data was obtained and used for attenuation correction and anatomic localization. Fasting blood glucose: 70 mg/dl COMPARISON:  CT 40/01/2724 and older FINDINGS: Mediastinal blood pool activity: SUV max 2.1 Liver activity: SUV max 2.9 NECK: No specific abnormal radiotracer uptake above mediastinal blood pool in the neck  including along lymph node change of the submandibular, posterior triangle or internal jugular region. There is some physiologic pharyngeal uptake. Near symmetric uptake along the visualized intracranial compartment. Incidental CT findings: Visualized portions of the paranasal sinuses and mastoid air cells are clear. Vascular calcifications are seen. Grossly the parotid glands, submandibular glands and thyroid  gland is unremarkable. Streak artifact related to the patient's earrings. Degenerative changes along the spine with some areas of stenosis suggested. CHEST: Mild uptake along the lower esophagus. The lower esophagus is patulous. Maximum SUV value of 8.3. Recommend further evaluation to exclude lesion when clinically appropriate. There is some left-sided paraspinal physiologic uptake along the musculature. No additional areas of abnormal radiotracer uptake seen above blood pool including axillary regions, hilum or mediastinum. No specific areas of abnormal lung uptake. Previously there is a left upper lobe area of nodular consolidation just above the left hilum. This area appears improved from previous examination. No specific abnormal  uptake in this location. In addition on the prior there were some small areas of nodularity in the left lower lobe medially. There is significant breathing motion noted during the examination but no clear defined nodular in this location today and no uptake. Incidental CT findings: Emphysematous changes identified. Significant breathing motion. Dependent ground-glass along the left upper lobe greater than right upper lobe and along the lung bases. Atelectasis is favored. Heart is enlarged. Small pericardial effusion. Coronary artery calcifications. Other vascular calcifications are seen. There are some small mediastinal nodes identified including precarinal which are unchanged from recent previous and again do not show any abnormal uptake. These are slightly larger than remote  study of 2022. ABDOMEN/PELVIS: There is diffuse extensive bowel uptake, likely physiologic. There is no clear abnormal uptake along the parenchymal organs are renal collecting systems. Physiologic collecting system uptake of the kidneys and along bladder. Incidental CT findings: Scattered vascular calcifications are identified. Uterus is present. Gallstones. Motion. Scattered colonic stool. Several distended loops of small large bowel air and some fluid. Splenic granuloma. No obstructing renal or ureteral stones. SKELETON: No abnormal uptake along the visualized osseous structures. Scattered degenerative changes noted along the spine. Degenerative changes elsewhere as well IMPRESSION: No specific areas of abnormal radiotracer uptake identified. The opacity seen suprahilar on the left on the prior CT scan as well as a small left lower lobe nodules are not as well seen today. Few prominent not pathologic enlarged mediastinal nodes are also noted. These are larger than 2022 but similar to the more recent CT and do not show abnormal uptake. There is some asymmetric uptake along the lower esophagus. Please correlate with any symptoms and with the focality, further workup to exclude underlying lesion when appropriate. Electronically Signed   By: Adrianna Horde M.D.   On: 07/24/2023 14:06     Past medical hx Past Medical History:  Diagnosis Date   Allergic rhinitis    Anemia    Anxiety    Bipolar affective disorder (HCC)    Cerebral aneurysm    COPD (chronic obstructive pulmonary disease) (HCC)    Coronary artery disease 02/13/2015   50% RCA otherwise normal   Cough 10/30/2011   CVA (cerebral vascular accident) (HCC)    Depression    DM (diabetes mellitus) (HCC)    Type II   Hyperlipidemia    Leukocytosis    Mediastinal adenopathy 11/26/2011   Pericardial effusion    Postoperative atrial fibrillation (HCC)    Smoking    SOB (shortness of breath) 10/30/2011     Social History   Tobacco Use    Smoking status: Every Day    Current packs/day: 0.50    Average packs/day: 0.5 packs/day for 51.0 years (25.5 ttl pk-yrs)    Types: Cigarettes   Smokeless tobacco: Never   Tobacco comments:    Pt states that she is back to smoking 5-6 cigarettes a day.Smokes this on occasion and states that it depends on the day KRD-08/11/2023  Vaping Use   Vaping status: Never Used  Substance Use Topics   Alcohol use: Not Currently   Drug use: No    Ms.Roeder reports that she has been smoking cigarettes. She has a 25.5 pack-year smoking history. She has never used smokeless tobacco. She reports that she does not currently use alcohol. She reports that she does not use drugs.  Tobacco Cessation: Ready to quit: Not Answered Counseling given: Not Answered Tobacco comments: Pt states that she is back to smoking 5-6 cigarettes  a day.Smokes this on occasion and states that it depends on the day KRD-08/11/2023 Current everyday smoker  Past surgical hx, Family hx, Social hx all reviewed.  Current Outpatient Medications on File Prior to Visit  Medication Sig   albuterol  (VENTOLIN  HFA) 108 (90 Base) MCG/ACT inhaler Inhale 2 puffs into the lungs every 6 (six) hours as needed for wheezing.   aspirin  81 MG tablet Take 81 mg by mouth daily.   atorvastatin  (LIPITOR) 80 MG tablet Take 1 tablet (80 mg total) by mouth daily.   Cholecalciferol (VITAMIN D3) 3000 UNITS TABS Take 3,000 Units by mouth daily.   CINNAMON PO Take 2,000 mg by mouth daily.   clonazePAM  (KLONOPIN ) 1 MG tablet Take 1 mg by mouth See admin instructions. Take 1mg  every AM then twice a day as needed for anxiety.   ezetimibe  (ZETIA ) 10 MG tablet Take 1 tablet (10 mg total) by mouth daily.   fexofenadine (ALLEGRA) 180 MG tablet Take 180 mg by mouth daily.   fluticasone  (FLONASE) 50 MCG/ACT nasal spray Place 1 spray into both nostrils daily as needed for allergies.    Fluticasone -Umeclidin-Vilant (TRELEGY ELLIPTA ) 100-62.5-25 MCG/ACT AEPB Inhale 1 puff  into the lungs daily.   glimepiride (AMARYL) 4 MG tablet Take 4 mg by mouth daily with breakfast.   metFORMIN  (GLUCOPHAGE ) 500 MG tablet Take 500 mg by mouth daily with breakfast.   metoprolol  tartrate (LOPRESSOR ) 25 MG tablet Take 1 tablet (25 mg total) by mouth 2 (two) times daily.   Multiple Vitamins-Minerals (WOMENS MULTIVITAMIN PLUS PO) Take 1 tablet by mouth daily.    OXYGEN Inhale 2 Units into the lungs at bedtime.   Polyethyl Glycol-Propyl Glycol (SYSTANE OP) Apply 1 drop to eye every 6 (six) hours as needed (dry eyes).   vitamin C (ASCORBIC ACID) 500 MG tablet Take 500 mg by mouth daily.   No current facility-administered medications on file prior to visit.     Allergies  Allergen Reactions   Penicillins Hives and Swelling    Has patient had a PCN reaction causing immediate rash, facial/tongue/throat swelling, SOB or lightheadedness with hypotension: Yes Has patient had a PCN reaction causing severe rash involving mucus membranes or skin necrosis: Yes Has patient had a PCN reaction that required hospitalization Yes- hospital for 7 days Has patient had a PCN reaction occurring within the last 10 years: No If all of the above answers are "NO", then may proceed with Cephalosporin use.  Other reaction(s): Swelling Has patient had a PCN reaction causing immediate rash, facial/tongue/throat swelling, SOB or lightheadedness with hypotension: Yes Has patient had a PCN reaction causing severe rash involving mucus membranes or skin necrosis: Yes Has patient had a PCN reaction that required hospitalization Yes- hospital for 7 days Has patient had a PCN reaction occurring within the last 10 years: No If all of the above answers are "NO", then may proceed with Cephalosporin use. Has patient had a PCN reaction causing immediate rash, facial/tongue/throat swelling, SOB or lightheadedness with hypotension: Yes Has patient had a PCN reaction causing severe rash involving mucus membranes or skin  necrosis: Yes Has patient had a PCN reaction that required hospitalization Yes- hospital for 7 days Has patient had a PCN reaction occurring within the last 10 years: No If all of the above answers are "NO", then may proceed with Cephalosporin use.     Review Of Systems:  Constitutional:   No  weight loss, night sweats,  Fevers, chills, fatigue, or  lassitude.  HEENT:  No headaches,  Difficulty swallowing,  Tooth/dental problems, or  Sore throat,                No sneezing, itching, ear ache, nasal congestion, post nasal drip,   CV:  No chest pain,  Orthopnea, PND, swelling in lower extremities, anasarca, dizziness, palpitations, syncope.   GI  No heartburn, indigestion, abdominal pain, nausea, vomiting, diarrhea, change in bowel habits, loss of appetite, bloody stools.   Resp: No shortness of breath with exertion or at rest.  No excess mucus, no productive cough,  No non-productive cough,  No coughing up of blood.  No change in color of mucus.  No wheezing.  No chest wall deformity  Skin: no rash or lesions.  GU: no dysuria, change in color of urine, no urgency or frequency.  No flank pain, no hematuria   MS:  No joint pain or swelling.  No decreased range of motion.  No back pain.  Psych:  No change in mood or affect. No depression or anxiety.  No memory loss.   Vital Signs BP 127/82 (BP Location: Left Arm, Patient Position: Sitting, Cuff Size: Normal)   Pulse 72   Ht 5\' 4"  (1.626 m)   Wt 139 lb 3.2 oz (63.1 kg)   SpO2 90%   BMI 23.89 kg/m    Physical Exam:  General- No distress,  A&Ox3, appropriate, somewhat forgetful ENT: No sinus tenderness, TM clear, pale nasal mucosa, no oral exudate,no post nasal drip, no LAN Cardiac: S1, S2, regular rate and rhythm, no murmur Chest: No wheeze/ rales/ dullness; no accessory muscle use, no nasal flaring, no sternal retractions, slightly diminished per bases bilaterally Abd.: Soft Non-tender, nondistended, bowel sounds positive,  Body mass index is 23.89 kg/m.  Ext: No clubbing cyanosis, edema, no obvious deformities Neuro:  normal strength, moving all extremities x 4, alert and oriented x 3, upper. Skin: No rashes, warm and dry, no obvious skin lesions Psych: normal mood and behavior   Assessment/Plan Abnormal low-dose CT read as a lung RADS 4B Current everyday smoker PET reassuring with some improvement since low-dose CT PET with some uptake in lower esophagus Plan The lung nodules we were concerned about do not show any significant uptake on PET. This is great news. We will do a 3 month follow up scan to continue to keep an eye on these nodules. This will be due in July 2025. You will get a call to get this scheduled.  There was notation of some uptake on the lower esophagus. I will send a referral to the GI MD to have this finding better evaluated.  It may be your reflux. You will get a call to get this scheduled.  Call if you need us  sooner. Please contact office for sooner follow up if symptoms do not improve or worsen or seek emergency care    I spent 20 minutes dedicated to the care of this patient on the date of this encounter to include pre-visit review of records, face-to-face time with the patient discussing conditions above, post visit ordering of testing, clinical documentation with the electronic health record, making appropriate referrals as documented, and communicating necessary information to the patient's healthcare team.    Raejean Bullock, NP 08/11/2023  3:02 PM

## 2023-09-11 ENCOUNTER — Encounter: Payer: Self-pay | Admitting: Acute Care

## 2023-10-10 ENCOUNTER — Encounter (HOSPITAL_COMMUNITY): Payer: Self-pay | Admitting: Interventional Radiology

## 2023-10-19 ENCOUNTER — Telehealth: Payer: Self-pay

## 2023-10-19 NOTE — Telephone Encounter (Signed)
 LVM to call office and schedule f/u CT due 10/16/2023.

## 2023-10-26 ENCOUNTER — Telehealth: Payer: Self-pay

## 2023-10-26 NOTE — Telephone Encounter (Signed)
 LVM to call and schedule 3 month follow up scan.

## 2023-10-29 ENCOUNTER — Encounter: Payer: Self-pay | Admitting: Acute Care

## 2023-11-02 NOTE — Telephone Encounter (Signed)
 Sent mychart message 11/02/2023 to schedule 3 month f/u scan.

## 2023-11-16 ENCOUNTER — Encounter: Payer: Self-pay | Admitting: Acute Care

## 2023-11-16 ENCOUNTER — Telehealth: Payer: Self-pay

## 2023-11-16 NOTE — Telephone Encounter (Signed)
 LVM to call office and schedule 3 month f/u scan.

## 2023-11-23 NOTE — Telephone Encounter (Signed)
 Mailed letter to home to schedule f/u CT. No response form VM and mychart message.

## 2023-12-07 ENCOUNTER — Telehealth: Payer: Self-pay

## 2023-12-07 NOTE — Telephone Encounter (Signed)
 Patient has been scheduled for 3 month f/u scan on 12/17/2023 at [facility]. I have spoken with her daughter Crystal and scheduled appt. LVM on patient's cell to call office with any questions as we have been unable to reach her by phone, mail and mychart.

## 2023-12-17 ENCOUNTER — Ambulatory Visit (HOSPITAL_COMMUNITY)

## 2024-01-07 ENCOUNTER — Ambulatory Visit (HOSPITAL_COMMUNITY)

## 2024-01-13 ENCOUNTER — Ambulatory Visit (HOSPITAL_COMMUNITY)
Admission: RE | Admit: 2024-01-13 | Discharge: 2024-01-13 | Disposition: A | Discharge status: Home / Self Care | Source: Ambulatory Visit | Attending: Acute Care | Admitting: Acute Care

## 2024-01-13 DIAGNOSIS — Z87891 Personal history of nicotine dependence: Secondary | ICD-10-CM | POA: Insufficient documentation

## 2024-01-13 DIAGNOSIS — R911 Solitary pulmonary nodule: Secondary | ICD-10-CM | POA: Insufficient documentation

## 2024-01-13 DIAGNOSIS — Z122 Encounter for screening for malignant neoplasm of respiratory organs: Secondary | ICD-10-CM | POA: Insufficient documentation

## 2024-01-19 ENCOUNTER — Telehealth: Payer: Self-pay

## 2024-01-19 NOTE — Telephone Encounter (Signed)
 Recent Lung CT results have been reviewed by [place], NP. Please call the patient and review their results. Previous nodules of concern have now resolved. This was the patient's last LCS as she will be turning 79 in a few months. She may discuss annual screenings with her PCP going forward. She may also follow up with us  at anytime for any concerns. Statin therapy noted. Send results and plan to PCP.    IMPRESSION: 1. Lung-RADS 2, benign appearance or behavior. Continue annual screening with low-dose chest CT without contrast in 12 months. 2. Cholelithiasis. 3. Aortic Atherosclerosis (ICD10-I70.0) and Emphysema (ICD10-J43.9). Coronary artery atherosclerosis.

## 2024-01-19 NOTE — Telephone Encounter (Signed)
 LVM to call office and review recent Lung CT results.

## 2024-01-21 NOTE — Telephone Encounter (Signed)
 Sent letter to [employer] to explain results and to advise on future imaging, if patient is interested.  Sent results to PCP with message that this is patient's last LDCT and we advised patient to discuss any requests for future imaging with her PCP.
